# Patient Record
Sex: Female | Born: 1950 | Race: Black or African American | Hispanic: No | Smoking: Never smoker
Health system: Southern US, Community
[De-identification: ages and names within clinical notes are randomized; demographics above are authoritative.]

## PROBLEM LIST (undated history)

## (undated) DIAGNOSIS — F32A Depression, unspecified: Secondary | ICD-10-CM

## (undated) DIAGNOSIS — E119 Type 2 diabetes mellitus without complications: Secondary | ICD-10-CM

## (undated) DIAGNOSIS — M1990 Unspecified osteoarthritis, unspecified site: Secondary | ICD-10-CM

## (undated) DIAGNOSIS — F419 Anxiety disorder, unspecified: Secondary | ICD-10-CM

## (undated) DIAGNOSIS — N189 Chronic kidney disease, unspecified: Secondary | ICD-10-CM

## (undated) DIAGNOSIS — G4730 Sleep apnea, unspecified: Secondary | ICD-10-CM

## (undated) DIAGNOSIS — I10 Essential (primary) hypertension: Secondary | ICD-10-CM

## (undated) DIAGNOSIS — E785 Hyperlipidemia, unspecified: Secondary | ICD-10-CM

## (undated) DIAGNOSIS — T7840XA Allergy, unspecified, initial encounter: Secondary | ICD-10-CM

## (undated) DIAGNOSIS — J449 Chronic obstructive pulmonary disease, unspecified: Secondary | ICD-10-CM

## (undated) DIAGNOSIS — K219 Gastro-esophageal reflux disease without esophagitis: Secondary | ICD-10-CM

## (undated) DIAGNOSIS — J45909 Unspecified asthma, uncomplicated: Secondary | ICD-10-CM

## (undated) DIAGNOSIS — H269 Unspecified cataract: Secondary | ICD-10-CM

## (undated) HISTORY — PX: TUBAL LIGATION: SHX77

## (undated) HISTORY — PX: EYE SURGERY: SHX253

## (undated) HISTORY — DX: Hyperlipidemia, unspecified: E78.5

## (undated) HISTORY — PX: BREAST EXCISIONAL BIOPSY: SUR124

## (undated) HISTORY — DX: Allergy, unspecified, initial encounter: T78.40XA

## (undated) HISTORY — DX: Unspecified asthma, uncomplicated: J45.909

## (undated) HISTORY — PX: BREAST BIOPSY: SHX20

## (undated) HISTORY — DX: Sleep apnea, unspecified: G47.30

## (undated) HISTORY — DX: Gastro-esophageal reflux disease without esophagitis: K21.9

## (undated) HISTORY — DX: Chronic kidney disease, unspecified: N18.9

## (undated) HISTORY — DX: Unspecified osteoarthritis, unspecified site: M19.90

## (undated) HISTORY — DX: Unspecified cataract: H26.9

## (undated) HISTORY — DX: Depression, unspecified: F32.A

## (undated) HISTORY — DX: Chronic obstructive pulmonary disease, unspecified: J44.9

## (undated) HISTORY — DX: Anxiety disorder, unspecified: F41.9

---

## 2016-03-28 DIAGNOSIS — E113213 Type 2 diabetes mellitus with mild nonproliferative diabetic retinopathy with macular edema, bilateral: Secondary | ICD-10-CM | POA: Diagnosis not present

## 2016-03-28 DIAGNOSIS — H1045 Other chronic allergic conjunctivitis: Secondary | ICD-10-CM | POA: Diagnosis not present

## 2016-03-28 DIAGNOSIS — H25811 Combined forms of age-related cataract, right eye: Secondary | ICD-10-CM | POA: Diagnosis not present

## 2016-03-28 DIAGNOSIS — E113293 Type 2 diabetes mellitus with mild nonproliferative diabetic retinopathy without macular edema, bilateral: Secondary | ICD-10-CM | POA: Diagnosis not present

## 2016-04-21 DIAGNOSIS — E1165 Type 2 diabetes mellitus with hyperglycemia: Secondary | ICD-10-CM | POA: Diagnosis not present

## 2016-05-20 DIAGNOSIS — M21612 Bunion of left foot: Secondary | ICD-10-CM | POA: Diagnosis not present

## 2016-06-26 DIAGNOSIS — R300 Dysuria: Secondary | ICD-10-CM | POA: Diagnosis not present

## 2016-06-26 DIAGNOSIS — Z794 Long term (current) use of insulin: Secondary | ICD-10-CM | POA: Diagnosis not present

## 2016-06-26 DIAGNOSIS — E1165 Type 2 diabetes mellitus with hyperglycemia: Secondary | ICD-10-CM | POA: Diagnosis not present

## 2016-06-26 DIAGNOSIS — K635 Polyp of colon: Secondary | ICD-10-CM | POA: Diagnosis not present

## 2016-07-08 DIAGNOSIS — G4710 Hypersomnia, unspecified: Secondary | ICD-10-CM | POA: Diagnosis not present

## 2016-07-08 DIAGNOSIS — G4733 Obstructive sleep apnea (adult) (pediatric): Secondary | ICD-10-CM | POA: Diagnosis not present

## 2016-10-03 ENCOUNTER — Encounter (HOSPITAL_COMMUNITY): Payer: Self-pay | Admitting: *Deleted

## 2016-10-03 ENCOUNTER — Ambulatory Visit (HOSPITAL_COMMUNITY)
Admission: EM | Admit: 2016-10-03 | Discharge: 2016-10-03 | Disposition: A | Discharge status: Home / Self Care | Payer: Medicare Other | Attending: Internal Medicine | Admitting: Internal Medicine

## 2016-10-03 DIAGNOSIS — G4730 Sleep apnea, unspecified: Secondary | ICD-10-CM | POA: Diagnosis not present

## 2016-10-03 DIAGNOSIS — Z880 Allergy status to penicillin: Secondary | ICD-10-CM | POA: Diagnosis not present

## 2016-10-03 DIAGNOSIS — Z888 Allergy status to other drugs, medicaments and biological substances status: Secondary | ICD-10-CM | POA: Insufficient documentation

## 2016-10-03 DIAGNOSIS — Z79899 Other long term (current) drug therapy: Secondary | ICD-10-CM | POA: Insufficient documentation

## 2016-10-03 DIAGNOSIS — Z794 Long term (current) use of insulin: Secondary | ICD-10-CM | POA: Diagnosis not present

## 2016-10-03 DIAGNOSIS — R350 Frequency of micturition: Secondary | ICD-10-CM | POA: Diagnosis not present

## 2016-10-03 DIAGNOSIS — E119 Type 2 diabetes mellitus without complications: Secondary | ICD-10-CM | POA: Insufficient documentation

## 2016-10-03 DIAGNOSIS — I10 Essential (primary) hypertension: Secondary | ICD-10-CM | POA: Insufficient documentation

## 2016-10-03 DIAGNOSIS — R42 Dizziness and giddiness: Secondary | ICD-10-CM | POA: Diagnosis not present

## 2016-10-03 DIAGNOSIS — Z9989 Dependence on other enabling machines and devices: Secondary | ICD-10-CM | POA: Diagnosis not present

## 2016-10-03 DIAGNOSIS — R300 Dysuria: Secondary | ICD-10-CM | POA: Insufficient documentation

## 2016-10-03 DIAGNOSIS — R81 Glycosuria: Secondary | ICD-10-CM | POA: Diagnosis not present

## 2016-10-03 HISTORY — DX: Essential (primary) hypertension: I10

## 2016-10-03 HISTORY — DX: Type 2 diabetes mellitus without complications: E11.9

## 2016-10-03 LAB — POCT URINALYSIS DIP (DEVICE)
Bilirubin Urine: NEGATIVE
Glucose, UA: 500 mg/dL — AB
Ketones, ur: NEGATIVE mg/dL
Nitrite: NEGATIVE
Protein, ur: 100 mg/dL — AB
Specific Gravity, Urine: 1.01 (ref 1.005–1.030)
Urobilinogen, UA: 0.2 mg/dL (ref 0.0–1.0)
pH: 5.5 (ref 5.0–8.0)

## 2016-10-03 MED ORDER — FLUCONAZOLE 150 MG PO TABS: 150.0000 mg | ORAL_TABLET | Freq: Once | ORAL | 0 refills | Status: AC

## 2016-10-03 NOTE — ED Notes (Addendum)
Orthostatic   Vital  Signs        159/75    Laying        bp  185/76   Sitting      bp   177/ 87    Standing      Pulses      Pulse  85    Laying      Pulse   86     Sitting         Pulse    98    Standing

## 2016-10-03 NOTE — ED Provider Notes (Signed)
[facility]    CSN: [id number] Arrival date & time: 10/03/16  1005     History   Chief Complaint Chief Complaint  Patient presents with  . Urinary Frequency    HPI Hayley Case is a 67 y.o. female. She presents today with dysuria, urinary frequency, going on for about a month. She also has a sensation of dizziness and off balance, worst when she changes position, or puts her head forward. She uses a CPAP and occasionally has minimal runny nose, but no head congestion or severe drainage to suggest infection. She has not fallen down, and was able to walk into the urgent care independently. In the last couple months, increased life stress: death of a couple of her siblings and sold her home and moved to [place] with her son, who took a job at [employer] recently.  Not sleeping well.     HPI  Past Medical History:  Diagnosis Date  . Diabetes mellitus without complication ([place])   . Hypertension   Sleep apnea, uses a CPAP    Past Surgical History:  Procedure Laterality Date  . BREAST BIOPSY      OB History    No data available       Home Medications    Prior to Admission medications   Medication Sig Start Date End Date Taking? Authorizing Provider  amLODipine (NORVASC) 10 MG tablet Take 10 mg by mouth daily.   Yes [provider]  Dulaglutide (TRULICITY [place]) Inject into the skin.   Yes [provider]  insulin aspart (NOVOLOG) 100 UNIT/ML injection Inject into the skin 3 (three) times daily before meals.   Yes [provider]  insulin glargine (LANTUS) 100 UNIT/ML injection Inject into the skin at bedtime.   Yes [provider]  losartan-hydrochlorothiazide (HYZAAR) 100-25 MG tablet Take 1 tablet by mouth daily.   Yes [provider]  METOPROLOL SUCCINATE PO Take by mouth.   Yes [provider]  fluconazole (DIFLUCAN) 150 MG tablet Take 1 tablet (150 mg total) by mouth once. Repeat dose in 3d if needed. 10/03/16  10/03/16  Sherlene Shams, MD    Family History History reviewed. No pertinent family history.  Social History Social History  Substance Use Topics  . Smoking status: Never Smoker  . Smokeless tobacco: Never Used  . Alcohol use No     Allergies   Penicillins and Septra [sulfamethoxazole-trimethoprim]   Review of Systems Review of Systems  All other systems reviewed and are negative.    Physical Exam Triage Vital Signs ED Triage Vitals [10/03/16 1101]  Enc Vitals Group     BP (!) 168/88     Pulse Rate 78     Resp 18     Temp 98.6 F (37 C)     Temp Source Oral     SpO2 100 %     Weight      Height      Pain Score      Pain Loc    Updated Vital Signs BP (!) 168/88 (BP Location: Left Arm)   Pulse 78   Temp 98.6 F (37 C) (Oral)   Resp 18   SpO2 100%   Physical Exam  Constitutional: She is oriented to person, place, and time. No distress.  HENT:  Head: Atraumatic.  Bilateral TMs translucent, no erythema No significant nasal congestion  Eyes:  Conjugate gaze observed, no eye redness/discharge  Neck: Neck supple.  Cardiovascular: Normal rate and regular  rhythm.   Pulmonary/Chest: No respiratory distress. She has no wheezes. She has no rales.  Lungs clear, symmetric breath sounds  Abdominal: She exhibits no distension.  Musculoskeletal: Normal range of motion.  Neurological: She is alert and oriented to person, place, and time.  Able to climb on/off exam table quickly, without assistance. Face is symmetric, speech is clear/coherent  Skin: Skin is warm and dry.  Nursing note and vitals reviewed.    UC Treatments / Results  Labs (all labs ordered are listed, but only abnormal results are displayed) Labs Reviewed  POCT URINALYSIS DIP (DEVICE) - Abnormal; Notable for the following:       Result Value   Glucose, UA 500 (*)    Hgb urine dipstick SMALL (*)    Protein, ur 100 (*)    Leukocytes, UA SMALL (*)    All other components within normal limits    URINE CULTURE    Procedures Procedures (including critical care time) None today  Final Clinical Impressions(s) / UC Diagnoses   Final diagnoses:  Dysuria  Urinary frequency  Dizzy  Glycosuria   Urine test today showed a lot of sugar.  A culture is pending. The urgent care will contact you if any additional treatment is needed. Prescription for Diflucan, for yeast, was sent to the pharmacy. Please take your diabetes and blood pressure medicines regularly. Dizziness may be related to elevated blood sugar, and drinking extra fluids could help with this.  New Prescriptions Discharge Medication List as of 10/03/2016 11:50 AM    START taking these medications   Details  fluconazole (DIFLUCAN) 150 MG tablet Take 1 tablet (150 mg total) by mouth once. Repeat dose in 3d if needed., Starting Fri 10/03/2016, Normal         Controlled Substance Prescriptions [place] Controlled Substance Registry consulted? No   Sherlene Shams, MD 10/03/16 2111

## 2016-10-03 NOTE — Discharge Instructions (Addendum)
Urine test today showed a lot of sugar.  A culture is pending. The urgent care will contact you if any additional treatment is needed. Prescription for Diflucan, for yeast, was sent to the pharmacy. Please take your diabetes and blood pressure medicines regularly. Dizziness may be related to elevated blood sugar, and drinking extra fluids could help with this.

## 2016-10-03 NOTE — ED Triage Notes (Signed)
Pt  States    Symptoms  Of  Urinary  Frequency       And  Discomfort   At  Times        Pt  Is  A  Diabetic  Takes    And history  Of  htn    She  Is  Visiting  From  [place]    She  States   She  Gets  Dizzy  As   Well  When  She  Holds  Her  Head  Down

## 2016-10-05 ENCOUNTER — Telehealth (HOSPITAL_COMMUNITY): Payer: Self-pay | Admitting: Internal Medicine

## 2016-10-05 LAB — URINE CULTURE: Culture: >=100000 — AB

## 2016-10-05 MED ORDER — NITROFURANTOIN MONOHYD MACRO 100 MG PO CAPS: 100.0000 mg | ORAL_CAPSULE | Freq: Two times a day (BID) | ORAL | 0 refills | Status: DC

## 2016-10-05 NOTE — Telephone Encounter (Signed)
Please let patient know that urine culture was positive for E coli germ, sensitive to nitrofurantoin.  Rx nitrofurantoin was sent to the pharmacy of record, [facility] on [address] in [place].  Recheck for further evaluation if symptoms are not improving.  LM

## 2016-10-15 ENCOUNTER — Ambulatory Visit: Payer: Medicare Other | Attending: Internal Medicine | Admitting: Physician Assistant

## 2016-10-15 ENCOUNTER — Encounter: Payer: Self-pay | Admitting: Physician Assistant

## 2016-10-15 VITALS — BP 146/84 | HR 85 | Temp 98.5°F | Resp 16 | Wt 208.2 lb

## 2016-10-15 DIAGNOSIS — R739 Hyperglycemia, unspecified: Secondary | ICD-10-CM

## 2016-10-15 DIAGNOSIS — Z79899 Other long term (current) drug therapy: Secondary | ICD-10-CM | POA: Insufficient documentation

## 2016-10-15 DIAGNOSIS — E118 Type 2 diabetes mellitus with unspecified complications: Secondary | ICD-10-CM | POA: Diagnosis not present

## 2016-10-15 DIAGNOSIS — Z880 Allergy status to penicillin: Secondary | ICD-10-CM | POA: Insufficient documentation

## 2016-10-15 DIAGNOSIS — R42 Dizziness and giddiness: Secondary | ICD-10-CM | POA: Diagnosis not present

## 2016-10-15 DIAGNOSIS — N3001 Acute cystitis with hematuria: Secondary | ICD-10-CM | POA: Diagnosis not present

## 2016-10-15 DIAGNOSIS — Z888 Allergy status to other drugs, medicaments and biological substances status: Secondary | ICD-10-CM | POA: Diagnosis not present

## 2016-10-15 DIAGNOSIS — Z794 Long term (current) use of insulin: Secondary | ICD-10-CM | POA: Diagnosis not present

## 2016-10-15 DIAGNOSIS — I10 Essential (primary) hypertension: Secondary | ICD-10-CM | POA: Diagnosis not present

## 2016-10-15 LAB — GLUCOSE, POCT (MANUAL RESULT ENTRY)
POC Glucose: 354 mg/dl — AB (ref 70–99)
POC Glucose: 311 mg/dl — AB (ref 70–99)

## 2016-10-15 LAB — POCT URINALYSIS DIPSTICK
Bilirubin, UA: NEGATIVE
Glucose, UA: 500
Nitrite, UA: NEGATIVE
Protein, UA: 100
Spec Grav, UA: 1.015 (ref 1.010–1.025)
Urobilinogen, UA: 0.2 E.U./dL
pH, UA: 5 (ref 5.0–8.0)

## 2016-10-15 LAB — POCT GLYCOSYLATED HEMOGLOBIN (HGB A1C): Hemoglobin A1C: 11

## 2016-10-15 MED ORDER — INSULIN ASPART 100 UNIT/ML ~~LOC~~ SOLN
25.0000 [IU] | Freq: Once | SUBCUTANEOUS | Status: AC
Start: 2016-10-15 — End: 2016-10-15
  Administered 2016-10-15: 25 [IU] via SUBCUTANEOUS

## 2016-10-15 MED ORDER — FLUCONAZOLE 150 MG PO TABS: 150.0000 mg | ORAL_TABLET | Freq: Once | ORAL | 0 refills | Status: AC

## 2016-10-15 MED ORDER — INSULIN GLARGINE 100 UNIT/ML ~~LOC~~ SOLN: 30.0000 [IU] | Freq: Two times a day (BID) | SUBCUTANEOUS | 1 refills | Status: DC

## 2016-10-15 MED ORDER — GLUCOSE BLOOD VI STRP: ORAL_STRIP | 12 refills | Status: DC

## 2016-10-15 MED ORDER — NITROFURANTOIN MONOHYD MACRO 100 MG PO CAPS: 100.0000 mg | ORAL_CAPSULE | Freq: Two times a day (BID) | ORAL | 0 refills | Status: DC

## 2016-10-15 MED ORDER — ONETOUCH DELICA LANCETS 33G MISC: 1 refills | Status: DC

## 2016-10-15 MED ORDER — LOSARTAN POTASSIUM-HCTZ 100-25 MG PO TABS: 1.0000 | ORAL_TABLET | Freq: Every day | ORAL | 1 refills | Status: DC

## 2016-10-15 MED ORDER — AMLODIPINE BESYLATE 10 MG PO TABS: 10.0000 mg | ORAL_TABLET | Freq: Every day | ORAL | 1 refills | Status: DC

## 2016-10-15 NOTE — Patient Instructions (Signed)
Check blood sugars fasting and at bedtime daily.  Some lunch time readings after lunch would also be helpful.  Drink 100 ounces water daily  Bring your medications with you to your next visit.

## 2016-10-15 NOTE — Progress Notes (Signed)
Patient ID: Hayley Case, female   DOB: 1950-05-21, 66 y.o.   MRN: [id number]      Hayley Case, is a 66 y.o. female  QQV:[id number]  [id number]:[id number]  DOB - 1950/08/29  Subjective:  Chief Complaint and HPI: Hayley Case is a 66 y.o. female here today to establish care and for a follow up visit  After being seen at the Urgent care 9/21 for dysuria and dizziness. She was treated with Macrobid and diflucan. She is still having some dysuria although it has improved some.  She moved here in July from [place].  Needs to establish with PCP.  Needs new glucometer.  Not checking blood sugars regularly.  Doesn't have medications with her and doesn't know some of her medication doses.  Dizziness is improving.  No h/o kidney problems/fluid restrictions.    She has been out of Novolog for a couple of months.  She has been taking Lantus 50units at bedtime and trulicity once/week but she doesn't know which dose of that she takes.    ED/Hospital notes reviewed.   Social History: moved here from [place] and is currently staying with her son  ROS:   Constitutional:  No f/c, No night sweats, No unexplained weight loss. EENT:  No vision changes, No blurry vision, No hearing changes. No mouth, throat, or ear problems.  Respiratory: No cough, No SOB Cardiac: No CP, no palpitations GI:  No abd pain, No N/V/D. GU: Mild Urinary s/sx Musculoskeletal: No joint pain Neuro: No headache, no dizziness, no motor weakness.  Skin: No rash Endocrine:  No polydipsia. No polyuria.  Psych: Denies SI/HI  No problems updated.  ALLERGIES: Allergies  Allergen Reactions  . Penicillins   . Septra [Sulfamethoxazole-Trimethoprim]     PAST MEDICAL HISTORY: Past Medical History:  Diagnosis Date  . Diabetes mellitus without complication ([place])   . Hypertension     MEDICATIONS AT HOME: Prior to Admission medications   Medication Sig Start Date End Date Taking? Authorizing Provider  amLODipine (NORVASC) 10 MG  tablet Take 1 tablet (10 mg total) by mouth daily. 10/15/16   Argentina Donovan, PA-C  Dulaglutide (TRULICITY [place]) Inject into the skin.    [provider]  fluconazole (DIFLUCAN) 150 MG tablet Take 1 tablet (150 mg total) by mouth once. 10/15/16 10/15/16  Argentina Donovan, PA-C  insulin aspart (NOVOLOG) 100 UNIT/ML injection Inject into the skin 3 (three) times daily before meals.    [provider]  insulin glargine (LANTUS) 100 UNIT/ML injection Inject 0.3 mLs (30 Units total) into the skin 2 (two) times daily. 10/15/16   Argentina Donovan, PA-C  losartan-hydrochlorothiazide (HYZAAR) 100-25 MG tablet Take 1 tablet by mouth daily. 10/15/16   Argentina Donovan, PA-C  METOPROLOL SUCCINATE PO Take by mouth.    [provider]  nitrofurantoin, macrocrystal-monohydrate, (MACROBID) 100 MG capsule Take 1 capsule (100 mg total) by mouth 2 (two) times daily. 10/15/16   Argentina Donovan, PA-C     Objective:  EXAM:   Vitals:   10/15/16 1349  BP: (!) 146/84  Pulse: 85  Resp: 16  Temp: 98.5 F (36.9 C)  TempSrc: Oral  SpO2: 99%  Weight: 208 lb 3.2 oz (94.4 kg)    General appearance : A&OX3. NAD. Non-toxic-appearing HEENT: Atraumatic and Normocephalic.  PERRLA. EOM intact.  Neck: supple, no JVD. No cervical lymphadenopathy. No thyromegaly Chest/Lungs:  Breathing-non-labored, Good air entry bilaterally, breath sounds normal without rales, rhonchi, or wheezing  CVS: S1 S2 regular, no  murmurs, gallops, rubs  Extremities: Bilateral Lower Ext shows no edema, both legs are warm to touch with = pulse throughout Neurology:  CN II-XII grossly intact, Non focal.   Psych:  TP linear. J/I WNL. Normal speech. Appropriate eye contact and affect.  Skin:  No Rash  Data Review Lab Results  Component Value Date   HGBA1C 11.0 10/15/2016     Assessment & Plan   1. Type 2 diabetes mellitus with complication, unspecified whether long term insulin use (HCC) Uncontrolled- - Glucose  (CBG) - HgB A1c - Urinalysis Dipstick - insulin aspart (novoLOG) injection 25 Units; Inject 0.25 mLs (25 Units total) into the skin once. - Comprehensive metabolic panel -increase and divide dose of Lantus for better coverage-30units twice daily.  Continue Trulicity.   Will add back SS as needed.  Check blood sugars fasting and at bedtime daily.  Some lunch time readings after lunch would also be helpful. Drink 100 ounces water daily Bring your medications with you to your next visit.    2. Acute cystitis with hematuria - Urine Culture - nitrofurantoin, macrocrystal-monohydrate, (MACROBID) 100 MG capsule; Take 1 capsule (100 mg total) by mouth 2 (two) times daily.  Dispense: 10 capsule; Refill: 0 - fluconazole (DIFLUCAN) 150 MG tablet; Take 1 tablet (150 mg total) by mouth once.  Dispense: 1 tablet; Refill: 0 Drink 100 ounces water daily  4. Hypertension, unspecified type Sub-optimal control-RF meds-will continue to follow/monitor.    Patient have been counseled extensively about nutrition and exercise.  Also gave resources for local exercise programs for people in her age-range.    Return in about 2 weeks (around 10/29/2016) for Nicoletta Ba for diabetes med management then in 6 weeks to be assigned PCP.  The patient was given clear instructions to go to ER or return to medical center if symptoms don't improve, worsen or new problems develop. The patient verbalized understanding. The patient was told to call to get lab results if they haven't heard anything in the next week.     Freeman Caldron, PA-C [facility], [place]   10/15/2016, 2:39 PM

## 2016-10-16 LAB — COMPREHENSIVE METABOLIC PANEL
ALT: 18 IU/L (ref 0–32)
AST: 15 IU/L (ref 0–40)
Albumin/Globulin Ratio: 1.6 (ref 1.2–2.2)
Albumin: 4.2 g/dL (ref 3.6–4.8)
Alkaline Phosphatase: 209 IU/L — ABNORMAL HIGH (ref 39–117)
BUN/Creatinine Ratio: 19 (ref 12–28)
BUN: 44 mg/dL — ABNORMAL HIGH (ref 8–27)
Bilirubin Total: 0.3 mg/dL (ref 0.0–1.2)
CO2: 19 mmol/L — ABNORMAL LOW (ref 20–29)
Calcium: 9.3 mg/dL (ref 8.7–10.3)
Chloride: 102 mmol/L (ref 96–106)
Creatinine, Ser: 2.33 mg/dL — ABNORMAL HIGH (ref 0.57–1.00)
GFR calc Af Amer: 25 mL/min/{1.73_m2} — ABNORMAL LOW (ref >59)
GFR calc non Af Amer: 21 mL/min/{1.73_m2} — ABNORMAL LOW (ref >59)
Globulin, Total: 2.7 g/dL (ref 1.5–4.5)
Glucose: 280 mg/dL — ABNORMAL HIGH (ref 65–99)
Potassium: 4.8 mmol/L (ref 3.5–5.2)
Sodium: 137 mmol/L (ref 134–144)
Total Protein: 6.9 g/dL (ref 6.0–8.5)

## 2016-10-19 LAB — URINE CULTURE

## 2016-10-20 ENCOUNTER — Telehealth: Payer: Self-pay

## 2016-10-20 NOTE — Telephone Encounter (Signed)
Contacted pt to go over lab results pt is aware of results and doesn't have any questions or concerns 

## 2016-10-30 ENCOUNTER — Ambulatory Visit: Payer: Medicare Other | Attending: Internal Medicine | Admitting: Pharmacist

## 2016-10-30 VITALS — BP 135/78 | HR 80

## 2016-10-30 DIAGNOSIS — E119 Type 2 diabetes mellitus without complications: Secondary | ICD-10-CM | POA: Diagnosis not present

## 2016-10-30 DIAGNOSIS — Z5181 Encounter for therapeutic drug level monitoring: Secondary | ICD-10-CM | POA: Diagnosis not present

## 2016-10-30 DIAGNOSIS — E118 Type 2 diabetes mellitus with unspecified complications: Secondary | ICD-10-CM | POA: Diagnosis not present

## 2016-10-30 DIAGNOSIS — Z794 Long term (current) use of insulin: Secondary | ICD-10-CM | POA: Diagnosis not present

## 2016-10-30 MED ORDER — INSULIN ASPART 100 UNIT/ML FLEXPEN: PEN_INJECTOR | SUBCUTANEOUS | 0 refills | Status: DC

## 2016-10-30 MED ORDER — ATORVASTATIN CALCIUM 40 MG PO TABS: 40.0000 mg | ORAL_TABLET | Freq: Every day | ORAL | 2 refills | Status: DC

## 2016-10-30 MED ORDER — LOSARTAN POTASSIUM-HCTZ 100-25 MG PO TABS: 1.0000 | ORAL_TABLET | Freq: Every day | ORAL | 1 refills | Status: DC

## 2016-10-30 MED ORDER — INSULIN GLARGINE 100 UNIT/ML SOLOSTAR PEN: 30.0000 [IU] | PEN_INJECTOR | Freq: Two times a day (BID) | SUBCUTANEOUS | 2 refills | Status: DC

## 2016-10-30 MED ORDER — ALLOPURINOL 100 MG PO TABS: 100.0000 mg | ORAL_TABLET | Freq: Every day | ORAL | 2 refills | Status: DC

## 2016-10-30 MED ORDER — DULAGLUTIDE 1.5 MG/0.5ML ~~LOC~~ SOAJ: 1.5000 mg | SUBCUTANEOUS | 2 refills | Status: DC

## 2016-10-30 MED ORDER — INSULIN PEN NEEDLE 31G X 5 MM MISC: 5 refills | Status: DC

## 2016-10-30 NOTE — Progress Notes (Signed)
    S:     Chief Complaint  Patient presents with  . Medication Management    Patient arrives in good spirits.  Presents for diabetes evaluation, education, and management at the request of Freeman Caldron, [place]. Patient was referred on 10/15/16.  Patient reports adherence with medications.  Current diabetes medications include: Lantus 30 units BID, Trulicity 1.5 mg weekly (typically on Fridays)  She reports that her blood sugars have been difficult to control previously and she has not been controlled recently.   Current hypertension medications include: losartan-HCTZ 100-25 mg daily, amlodipine 10 mg daily, metoprolol  Patient denies hypoglycemic events.  Patient reported dietary habits: Eats 3 meals/day. Has been drinking more sweet tea since moving to the [place]. She ate a lot of things she shouldn't have last week when she didn't have power.   Patient denies nocturia.  Patient denies neuropathy. Patient denies visual changes. Patient reports self foot exams.   Patient brought with her all of her medications and they were reviewed with her and added to her medication list.   O:  Physical Exam   ROS   Lab Results  Component Value Date   HGBA1C 11.0 10/15/2016   There were no vitals filed for this visit.  Home fasting CBG: 80s-100s 2 hour post-prandial/random CBG: 100s-384 (typically higher with lunch and dinner)   A/P: Diabetes longstanding currently uncontrolled based on A1c of 11 but home readings are improving. Patient denies hypoglycemic events and is able to verbalize appropriate hypoglycemia management plan. Patient reports adherence with medication. Control is suboptimal due to dietary indiscretion and sedentary lifestyle.  Continue all medications as prescribed. Restart Novolog 5 units before lunch and dinner for now. Will likely need breakfast dose too but she said historically she has taken less Novolog in the morning so will add that back after the lunch and  dinner doses are titrated up. Encouraged patient to drink Crystal Light or unsweet tea with Splenda.   Next A1C anticipated January 2019.    Hypertension longstanding currently controlled on current medications.  Patient reports adherence with medication. Continue current medications as prescribed.  Written patient instructions provided.  Total time in face to face counseling 30 minutes.   Follow up in Pharmacist Clinic Visit in 2 weeks.   Patient seen with Thornton Park, PharmD Candidate

## 2016-10-30 NOTE — Patient Instructions (Addendum)
Thanks for coming to see [place]!  Start the Novolog back - 5 units before lunch and dinner. Do not take it at breakfast for right now.  Continue to get blood sugar readings  Try Crystal Light or unsweetened tea with Splenda or your choice sweetner  Come back to see me in 2 weeks

## 2016-11-11 DIAGNOSIS — Z23 Encounter for immunization: Secondary | ICD-10-CM | POA: Diagnosis not present

## 2016-11-18 ENCOUNTER — Ambulatory Visit: Payer: Medicare Other | Attending: Internal Medicine | Admitting: Pharmacist

## 2016-11-18 ENCOUNTER — Encounter: Payer: Self-pay | Admitting: Pharmacist

## 2016-11-18 DIAGNOSIS — Z794 Long term (current) use of insulin: Secondary | ICD-10-CM | POA: Insufficient documentation

## 2016-11-18 DIAGNOSIS — E118 Type 2 diabetes mellitus with unspecified complications: Secondary | ICD-10-CM

## 2016-11-18 DIAGNOSIS — E119 Type 2 diabetes mellitus without complications: Secondary | ICD-10-CM | POA: Insufficient documentation

## 2016-11-18 NOTE — Patient Instructions (Addendum)
Thanks for coming to see [place]!  Take the Novolog now that you are back home and see if that helps with your after dinner blood sugar readings. Take right before you eat.  Follow up with [place] next week.

## 2016-11-18 NOTE — Progress Notes (Signed)
    S:     Chief Complaint  Patient presents with  . Medication Management    Patient arrives in good spirits.  Presents for diabetes evaluation, education, and management at the request of Dr. Doreene Burke. Patient was referred on 10/15/16.  Patient reports adherence with medications except for her Novolog - she is traveling, and she forgot to take it.  Current diabetes medications include: Lantus 30 units BID, Trulicity 1.5 mg weekly (typically on Fridays), Novolog 5 units BID before lunch and dinner.  Patient denies hypoglycemic events.  Patient reported dietary habits: has been eating better since last visit.    Patient denies nocturia.  Patient denies neuropathy. Patient denies visual changes. Patient reports self foot exams.   O:  Physical Exam   ROS   Lab Results  Component Value Date   HGBA1C 11.0 10/15/2016   There were no vitals filed for this visit.  Home fasting CBG: 84-115 2 hour post-prandial/random CBG: 100s-384 (typically higher with lunch and dinner, mostly in the 200s)  Avg  CBG 165.4   A/P: Diabetes longstanding currently uncontrolled based on A1c of 11 but home readings are improving. Patient denies hypoglycemic events and is able to verbalize appropriate hypoglycemia management plan. Patient reports adherence with medication. Control is suboptimal due to dietary indiscretion and sedentary lifestyle.  Continue all medications as prescribed. Instructed patient to start taking the Novolog now that she is home and see if that helps her afternoon and evening post-prandials. Congratulated patient on her progress so far, her fastings are controlled and we will hopefully get her post-prandials to goal soon.   Next A1C anticipated January 2019.    Written patient instructions provided.  Total time in face to face counseling 30 minutes.   Follow up with PCP in 1 weeks as scheduled.  Patient seen with Thornton Park, PharmD Candidate

## 2016-11-26 ENCOUNTER — Ambulatory Visit: Payer: BLUE CROSS/BLUE SHIELD | Admitting: Family Medicine

## 2016-12-08 ENCOUNTER — Encounter: Payer: Self-pay | Admitting: Nurse Practitioner

## 2016-12-08 ENCOUNTER — Telehealth: Payer: Self-pay

## 2016-12-08 ENCOUNTER — Ambulatory Visit: Payer: Medicare Other | Attending: Family Medicine | Admitting: Nurse Practitioner

## 2016-12-08 VITALS — BP 149/83 | HR 92 | Temp 97.8°F | Resp 12 | Ht 64.96 in | Wt 211.8 lb

## 2016-12-08 DIAGNOSIS — E785 Hyperlipidemia, unspecified: Secondary | ICD-10-CM | POA: Diagnosis not present

## 2016-12-08 DIAGNOSIS — Z794 Long term (current) use of insulin: Secondary | ICD-10-CM | POA: Insufficient documentation

## 2016-12-08 DIAGNOSIS — Z881 Allergy status to other antibiotic agents status: Secondary | ICD-10-CM | POA: Insufficient documentation

## 2016-12-08 DIAGNOSIS — Z9989 Dependence on other enabling machines and devices: Secondary | ICD-10-CM

## 2016-12-08 DIAGNOSIS — Z800 Family history of malignant neoplasm of digestive organs: Secondary | ICD-10-CM | POA: Diagnosis not present

## 2016-12-08 DIAGNOSIS — G4733 Obstructive sleep apnea (adult) (pediatric): Secondary | ICD-10-CM

## 2016-12-08 DIAGNOSIS — Z9889 Other specified postprocedural states: Secondary | ICD-10-CM | POA: Diagnosis not present

## 2016-12-08 DIAGNOSIS — Z9114 Patient's other noncompliance with medication regimen: Secondary | ICD-10-CM | POA: Diagnosis not present

## 2016-12-08 DIAGNOSIS — R399 Unspecified symptoms and signs involving the genitourinary system: Secondary | ICD-10-CM

## 2016-12-08 DIAGNOSIS — I10 Essential (primary) hypertension: Secondary | ICD-10-CM | POA: Diagnosis not present

## 2016-12-08 DIAGNOSIS — Z79899 Other long term (current) drug therapy: Secondary | ICD-10-CM | POA: Diagnosis not present

## 2016-12-08 DIAGNOSIS — Z880 Allergy status to penicillin: Secondary | ICD-10-CM | POA: Insufficient documentation

## 2016-12-08 DIAGNOSIS — E1169 Type 2 diabetes mellitus with other specified complication: Secondary | ICD-10-CM

## 2016-12-08 DIAGNOSIS — E119 Type 2 diabetes mellitus without complications: Secondary | ICD-10-CM | POA: Insufficient documentation

## 2016-12-08 LAB — POCT URINALYSIS DIPSTICK
Bilirubin, UA: NEGATIVE
Glucose, UA: NEGATIVE
Ketones, UA: NEGATIVE
Nitrite, UA: POSITIVE
Protein, UA: 300
Spec Grav, UA: 1.02 (ref 1.010–1.025)
Urobilinogen, UA: 0.2 E.U./dL
pH, UA: 5.5 (ref 5.0–8.0)

## 2016-12-08 LAB — GLUCOSE, POCT (MANUAL RESULT ENTRY): POC Glucose: 155 mg/dl — AB (ref 70–99)

## 2016-12-08 MED ORDER — CIPROFLOXACIN HCL 500 MG PO TABS: 500.0000 mg | ORAL_TABLET | Freq: Two times a day (BID) | ORAL | 0 refills | Status: AC

## 2016-12-08 NOTE — Telephone Encounter (Signed)
-----   Message from Gildardo Pounds, NP sent at 12/08/2016  1:43 PM EST ----- Please call patient and let her know she is still showing a UTI. Will have abx ordered and sent to pharmacy. Once cultures return will let her know if she is on the appropriate antibiotic.

## 2016-12-08 NOTE — Telephone Encounter (Signed)
Patient informed on urine result and ABX sent to her pharmacy for pick up.   Patient verified DOB.

## 2016-12-08 NOTE — Patient Instructions (Addendum)
May try Eucerin or Aquaphor cream for dry spots.

## 2016-12-08 NOTE — Progress Notes (Addendum)
Assessment & Plan:  Hayley Case was seen today for follow-up.  Diagnoses and all orders for this visit:  Essential hypertension Continue all antihypertensives as prescribed.  Remember to bring in your blood pressure log with you for your follow up appointment.  DASH/Mediterranean Diets are healthier choices for HTN.    Type 2 diabetes mellitus without complication, without long-term current use of insulin (HCC) -     Glucose (CBG) Diabetes is poorly controlled. Advised patient to keep a fasting blood sugar log fast, 2 hours post lunch and bedtime which will be reviewed at the next office visit.  Hyperlipidemia associated with type 2 diabetes mellitus ([place]) Continue medications as prescribed. Exercise at least 150 minutes per week Continue to work on low fat, heart healthy diet and participate in regular aerobic exercise program to control as well.  Lower urinary tract symptoms -     Urinalysis, dipstick only -     Urinalysis Dipstick -     Urine Culture  OSA on CPAP Patient reportedly stopped using 3 months ago due to cleaning her supplies with clorox and now the odor has prevented her from sleeping with mast at night. I did counsel her on the complications that can occur from uncontrolled OSA including heart disease, pulmonary disease and even death. I instructed Emory to place her supplies in a safe area where there is clean fresh air and allow them current odors to be eliminated.   Patient has been counseled on age-appropriate routine health concerns for screening and prevention. These are reviewed and up-to-date. Referrals have been placed accordingly. Immunizations are up-to-date or declined.    Subjective:   Chief Complaint  Patient presents with  . Follow-up    Patient is here for a follow up and need medication refills; metoprolol.    HPI Hayley Case 66 y.o. female presents to office today to establish care as new patient. She moved here from [place] to [facility] in August.  Reports feelings of loneliness as most of her friends are still in [place] as well as experiencing grief with the recent loss of her brother and other siblings over the past 1-2 years.  HEALTH MAINTENANCE She would like to hold of on referrals at this time as her insurance will be switching over to a new carrier in a few months.  Colonoscopy: Had 14 polyps removed last year. Recommended to have repeat colonoscopy this year. She has a family history of colon cancer (sister). Ophthalmology: Will schedule at her next office appointment.  Mammogram: Will schedule at her next office appointment.  DEXA SCAN: Will schedule at her next office appointment.     DM II She has not been compliant with medication administration. States she forgets to inject her novolog. Endorses compliance with Trulicity and Lantus. We discussed complications that can occur from poorly controlled DM. She is aware of the risks with poor medication compliance. She does not check her blood sugars and does not have a log with her today.  Lab Results  Component Value Date   HGBA1C 11.0 10/15/2016   Essential Hypertension She has been taking 1/2 tablet of her losartan-hctz. Reports dizziness with initial onset of taking it a few months ago. I have encouraged her to make sure she is drinking plenty of water daily and to restart the losartan-hctz at the full dosage. If she begins to experience dizziness again she is aware to stop taking it, notify the office and we will look at increasing her metoprolol. She does  not check her blood pressure at home nor does she have a blood pressure cuff. She currently denies shortness of breath, chest pain, lightheadedness, dizziness, palpitations or BLE edema. She does endorse medication compliance with her norvasc and metoprolol.   Hyperlipidemia She takes atorvastatin and endorses medication compliance. She denies statin intolerance or myalgias. She is not diet compliant. Risk factors for heart  disease and stroke include: Weight, age, diabetes and hypertension.   Urinary Tract Infection Patient complains of burning with urination, left flank pain, frequency, incomplete bladder emptying and pain in the lower abdomen She has had symptoms for several weeks.  Patient denies back pain, fever and vaginal discharge. Patient does have a history of recurrent UTI.  Patient does not have a history of pyelonephritis. She was given Diflucan in September for increased glucose in urine and possible yeast infection; culture showed Ecoli so she was then order macrobid which she reports taking. She was given a second dose of macrobid on 10-15-2016 for recurrent infection. Today she continues to report the same symptoms. Will order urine culture and treat accordingly.    Review of Systems  Constitutional: Negative for fever, malaise/fatigue and weight loss.  HENT: Negative.  Negative for nosebleeds.   Eyes: Negative.  Negative for blurred vision, double vision and photophobia.  Respiratory: Negative.  Negative for cough and shortness of breath.   Cardiovascular: Negative.  Negative for chest pain, palpitations and leg swelling.  Gastrointestinal: Negative.  Negative for abdominal pain, constipation, diarrhea, heartburn, nausea and vomiting.  Genitourinary: Positive for dysuria, flank pain, frequency and urgency. Negative for hematuria.  Musculoskeletal: Negative for myalgias.  Neurological: Negative.  Negative for dizziness, focal weakness, seizures and headaches.  Endo/Heme/Allergies: Negative for environmental allergies.  Psychiatric/Behavioral: Negative.  Negative for suicidal ideas.    Past Medical History:  Diagnosis Date  . Diabetes mellitus without complication ([place])   . Hypertension     Past Surgical History:  Procedure Laterality Date  . BREAST BIOPSY      History reviewed. No pertinent family history.  Social History Reviewed with no changes to be made today.   Outpatient  Medications Prior to Visit  Medication Sig Dispense Refill  . allopurinol (ZYLOPRIM) 100 MG tablet Take 1 tablet (100 mg total) by mouth daily. 30 tablet 2  . amLODipine (NORVASC) 10 MG tablet Take 1 tablet (10 mg total) by mouth daily. 90 tablet 1  . atorvastatin (LIPITOR) 40 MG tablet Take 1 tablet (40 mg total) by mouth daily. 30 tablet 2  . Dulaglutide (TRULICITY) 1.5 JX/9.1YN SOPN Inject 1.5 mg into the skin once a week. 4 pen 2  . glucose blood test strip Use as instructed 100 each 12  . glucose blood test strip Use as instructed 100 each 12  . insulin aspart (NOVOLOG FLEXPEN) 100 UNIT/ML FlexPen Inject 5 units before lunch and dinner. 15 mL 0  . Insulin Glargine (LANTUS SOLOSTAR) 100 UNIT/ML Solostar Pen Inject 30 Units into the skin 2 (two) times daily. 5 pen 2  . Insulin Pen Needle (B-D UF III MINI PEN NEEDLES) 31G X 5 MM MISC Use as directed 100 each 5  . losartan-hydrochlorothiazide (HYZAAR) 100-25 MG tablet Take 1 tablet by mouth daily. 90 tablet 1  . metoprolol succinate (TOPROL-XL) 100 MG 24 hr tablet Take 100 mg by mouth daily. Take with or immediately following a meal.    . ONETOUCH DELICA LANCETS 82N MISC Check blood sugar fasting and at bedtime and some days after lunch 100 each  1  . nitrofurantoin, macrocrystal-monohydrate, (MACROBID) 100 MG capsule Take 1 capsule (100 mg total) by mouth 2 (two) times daily. (Patient not taking: Reported on 12/08/2016) 10 capsule 0   No facility-administered medications prior to visit.     Allergies  Allergen Reactions  . Penicillins   . Septra [Sulfamethoxazole-Trimethoprim]        Objective:    BP (!) 149/83 (Cuff Size: Normal)   Pulse 92   Temp 97.8 F (36.6 C) (Oral)   Resp 12   Ht 5' 4.96" (1.65 m)   Wt 211 lb 12.8 oz (96.1 kg)   SpO2 97%   BMI 35.29 kg/m  Wt Readings from Last 3 Encounters:  12/08/16 211 lb 12.8 oz (96.1 kg)  10/15/16 208 lb 3.2 oz (94.4 kg)    Physical Exam  Constitutional: She is oriented to  person, place, and time. She appears well-developed and well-nourished. She is cooperative.  HENT:  Head: Normocephalic and atraumatic.  Eyes: EOM are normal.  Neck: Normal range of motion.  Cardiovascular: Normal rate, regular rhythm, normal heart sounds and intact distal pulses. Exam reveals no gallop and no friction rub.  No murmur heard. Pulmonary/Chest: Effort normal and breath sounds normal. No tachypnea. No respiratory distress. She has no decreased breath sounds. She has no wheezes. She has no rhonchi. She has no rales. She exhibits no tenderness.  Abdominal: Soft. Bowel sounds are normal. There is no hepatosplenomegaly, splenomegaly or hepatomegaly. There is tenderness in the left upper quadrant. There is no CVA tenderness. No hernia. Hernia confirmed negative in the right inguinal area and confirmed negative in the left inguinal area.  Musculoskeletal: Normal range of motion. She exhibits no edema.  Neurological: She is alert and oriented to person, place, and time. Coordination normal.  Skin: Skin is warm and dry.  Psychiatric: She has a normal mood and affect. Her behavior is normal. Judgment and thought content normal.  Nursing note and vitals reviewed.        Patient has been counseled extensively about nutrition and exercise as well as the importance of adherence with medications and regular follow-up. The patient was given clear instructions to go to ER or return to medical center if symptoms don't improve, worsen or new problems develop. The patient verbalized understanding.   Follow-up: Return in about 2 months (around 02/07/2017).   Gildardo Pounds, FNP-BC [facility] and [place], [place]   12/08/2016, 3:29 PM

## 2016-12-11 LAB — URINE CULTURE

## 2016-12-12 ENCOUNTER — Telehealth: Payer: Self-pay

## 2016-12-12 NOTE — Telephone Encounter (Signed)
-----   Message from Hayley Pounds, NP sent at 12/12/2016 10:02 AM EST ----- Please let patient know the antibiotic that she is currently on should treat her UTI based on the culture results. Please remember to wipe from front to back as discussed.

## 2016-12-12 NOTE — Telephone Encounter (Signed)
Patient informed on antibiotic for UTI.  Patient verified DOB.

## 2016-12-22 ENCOUNTER — Ambulatory Visit: Payer: BLUE CROSS/BLUE SHIELD | Admitting: Nurse Practitioner

## 2017-01-02 ENCOUNTER — Ambulatory Visit: Payer: Medicare Other | Attending: Nurse Practitioner | Admitting: Nurse Practitioner

## 2017-01-02 ENCOUNTER — Other Ambulatory Visit: Payer: Self-pay | Admitting: Nurse Practitioner

## 2017-01-02 ENCOUNTER — Telehealth: Payer: Self-pay | Admitting: Nurse Practitioner

## 2017-01-02 ENCOUNTER — Encounter: Payer: Self-pay | Admitting: Nurse Practitioner

## 2017-01-02 VITALS — BP 138/73 | HR 87 | Temp 98.3°F | Ht 64.0 in | Wt 212.8 lb

## 2017-01-02 DIAGNOSIS — E119 Type 2 diabetes mellitus without complications: Secondary | ICD-10-CM | POA: Diagnosis not present

## 2017-01-02 DIAGNOSIS — Z888 Allergy status to other drugs, medicaments and biological substances status: Secondary | ICD-10-CM | POA: Insufficient documentation

## 2017-01-02 DIAGNOSIS — M542 Cervicalgia: Secondary | ICD-10-CM

## 2017-01-02 DIAGNOSIS — Z794 Long term (current) use of insulin: Secondary | ICD-10-CM | POA: Diagnosis not present

## 2017-01-02 DIAGNOSIS — I10 Essential (primary) hypertension: Secondary | ICD-10-CM | POA: Insufficient documentation

## 2017-01-02 DIAGNOSIS — Z79899 Other long term (current) drug therapy: Secondary | ICD-10-CM | POA: Diagnosis not present

## 2017-01-02 DIAGNOSIS — Z880 Allergy status to penicillin: Secondary | ICD-10-CM | POA: Insufficient documentation

## 2017-01-02 DIAGNOSIS — Z9889 Other specified postprocedural states: Secondary | ICD-10-CM | POA: Insufficient documentation

## 2017-01-02 DIAGNOSIS — M25519 Pain in unspecified shoulder: Secondary | ICD-10-CM

## 2017-01-02 DIAGNOSIS — M25512 Pain in left shoulder: Secondary | ICD-10-CM | POA: Insufficient documentation

## 2017-01-02 LAB — GLUCOSE, POCT (MANUAL RESULT ENTRY): POC Glucose: 98 mg/dl (ref 70–99)

## 2017-01-02 MED ORDER — TIZANIDINE HCL 2 MG PO CAPS: 2.0000 mg | ORAL_CAPSULE | Freq: Two times a day (BID) | ORAL | 0 refills | Status: AC

## 2017-01-02 MED ORDER — ALLOPURINOL 100 MG PO TABS
100.0000 mg | ORAL_TABLET | Freq: Every day | ORAL | 2 refills | Status: DC
Start: 2017-01-02 — End: 2017-04-20

## 2017-01-02 NOTE — Progress Notes (Signed)
Assessment & Plan:  Hayley Case was seen today for hypertension.  Diagnoses and all orders for this visit:  Essential hypertension Continue all antihypertensives as prescribed.  Remember to bring in your blood pressure log with you for your follow up appointment.  DASH/Mediterranean Diets are healthier choices for HTN.  Follow up in January   Neck and shoulder pain -     tizanidine (ZANAFLEX) 2 MG capsule; Take 1 capsule (2 mg total) by mouth 2 (two) times daily for 14 days. May use heat application to affected area May alternate with acetaminophen and ibuprofen for pain.  Follow up in January  Suggested she move the TV out of the room or hang on wall. She could also use a lamp for lighting in the room instead of the TV light which is not beneficial for restful sleep.   Type 2 diabetes mellitus without complication, without long-term current use of insulin (HCC) -     Glucose (CBG) Advised patient to keep a fasting blood sugar log fast, 2 hours post lunch and bedtime which will be reviewed at the next office visit.    Patient has been counseled on age-appropriate routine health concerns for screening and prevention. These are reviewed and up-to-date. Referrals have been placed accordingly. Immunizations are up-to-date or declined.    Subjective:   Chief Complaint  Patient presents with  . Hypertension    Patient is here for a blood pressure check. Patient stated she is having pain on her left shoulder down to her arm, and may think its due to the way she's sleeping.    HPI Hayley Case 66 y.o. female presents to office today for blood pressure recheck.   Shoulder Pain Patient complaints of left shoulder and neck pain. TThe pain is described as aching and sharp.  The onset of the pain was gradual.  The pain occurs continuously and lasts most of the day. .  Location is lateral, neck. No history of dislocation. Symptoms are aggravated by all activities. Symptoms are diminished by   rest.   Limited activities include: no limitations. mild stiffness is reported. Patient is retired and reports the side of her neck and shoulder where she is experiencing the pain is also the same side she lays on at night watching TV. She sleeps with the TV on and feels this is contributing to her pain due to her positioning.   Essential Hypertension Blood pressure looks great today. She denies chest pain, shortness of breath, palpitations, headaches or BLE edema. She endorses medication compliance. Her son has recently been diagnosed with heart disease and a few other health issues. She has tried to make heal BP Readings from Last 3 Encounters:  01/02/17 138/73  12/08/16 (!) 149/83  10/30/16 135/78    Review of Systems  Constitutional: Negative for fever, malaise/fatigue and weight loss.  Eyes: Negative for blurred vision.  Respiratory: Negative.  Negative for cough and shortness of breath.   Cardiovascular: Negative.  Negative for chest pain, palpitations and leg swelling.  Gastrointestinal: Negative.  Negative for abdominal pain, constipation, diarrhea, heartburn, nausea and vomiting.  Genitourinary:       Stress incontinence  Musculoskeletal: Positive for myalgias and neck pain.  Neurological: Negative.  Negative for dizziness, focal weakness, seizures and headaches.  Psychiatric/Behavioral: Negative.  Negative for suicidal ideas.    Past Medical History:  Diagnosis Date  . Diabetes mellitus without complication ([place])   . Hypertension     Past Surgical History:  Procedure Laterality  Date  . BREAST BIOPSY      History reviewed. No pertinent family history.  Social History Reviewed with no changes to be made today.   Outpatient Medications Prior to Visit  Medication Sig Dispense Refill  . allopurinol (ZYLOPRIM) 100 MG tablet Take 1 tablet (100 mg total) by mouth daily. 30 tablet 2  . amLODipine (NORVASC) 10 MG tablet Take 1 tablet (10 mg total) by mouth daily. 90 tablet 1    . atorvastatin (LIPITOR) 40 MG tablet Take 1 tablet (40 mg total) by mouth daily. 30 tablet 2  . Dulaglutide (TRULICITY) 1.5 EX/5.1ZG SOPN Inject 1.5 mg into the skin once a week. 4 pen 2  . glucose blood test strip Use as instructed 100 each 12  . glucose blood test strip Use as instructed 100 each 12  . insulin aspart (NOVOLOG FLEXPEN) 100 UNIT/ML FlexPen Inject 5 units before lunch and dinner. 15 mL 0  . Insulin Glargine (LANTUS SOLOSTAR) 100 UNIT/ML Solostar Pen Inject 30 Units into the skin 2 (two) times daily. 5 pen 2  . Insulin Pen Needle (B-D UF III MINI PEN NEEDLES) 31G X 5 MM MISC Use as directed 100 each 5  . losartan-hydrochlorothiazide (HYZAAR) 100-25 MG tablet Take 1 tablet by mouth daily. 90 tablet 1  . metoprolol succinate (TOPROL-XL) 100 MG 24 hr tablet Take 100 mg by mouth daily. Take with or immediately following a meal.    . ONETOUCH DELICA LANCETS 01V MISC Check blood sugar fasting and at bedtime and some days after lunch 100 each 1   No facility-administered medications prior to visit.     Allergies  Allergen Reactions  . Penicillins   . Septra [Sulfamethoxazole-Trimethoprim]        Objective:    BP 138/73 (BP Location: Left Arm, Patient Position: Sitting, Cuff Size: Normal)   Pulse 87   Temp 98.3 F (36.8 C) (Oral)   Ht 5\' 4"  (1.626 m)   Wt 212 lb 12.8 oz (96.5 kg)   SpO2 98%   BMI 36.53 kg/m  Wt Readings from Last 3 Encounters:  01/02/17 212 lb 12.8 oz (96.5 kg)  12/08/16 211 lb 12.8 oz (96.1 kg)  10/15/16 208 lb 3.2 oz (94.4 kg)    Physical Exam  Constitutional: She is oriented to person, place, and time. She appears well-developed and well-nourished. She is cooperative.  HENT:  Head: Normocephalic and atraumatic.  Eyes: EOM are normal.  Neck: Normal range of motion.  Cardiovascular: Normal rate, regular rhythm, normal heart sounds and intact distal pulses. Exam reveals no gallop and no friction rub.  No murmur heard. Pulmonary/Chest: Effort  normal and breath sounds normal. No tachypnea. No respiratory distress. She has no decreased breath sounds. She has no wheezes. She has no rhonchi. She has no rales. She exhibits no tenderness.  Abdominal: Soft. Bowel sounds are normal.  Musculoskeletal: Normal range of motion. She exhibits no edema.  Neurological: She is alert and oriented to person, place, and time. Coordination normal.  Skin: Skin is warm and dry.  Psychiatric: She has a normal mood and affect. Her behavior is normal. Judgment and thought content normal.  Nursing note and vitals reviewed.     Patient has been counseled extensively about nutrition and exercise as well as the importance of adherence with medications and regular follow-up. The patient was given clear instructions to go to ER or return to medical center if symptoms don't improve, worsen or new problems develop. The patient verbalized understanding.  Follow-up: No Follow-up on file.   Gildardo Pounds, FNP-BC [facility] and [place], [place]   01/02/2017, 1:04 PM

## 2017-01-02 NOTE — Telephone Encounter (Signed)
Pt called to request a refill on  -allopurinol (ZYLOPRIM) 100 MG tablet  Please follow up

## 2017-01-02 NOTE — Telephone Encounter (Signed)
Medication has been sent to [facility]

## 2017-01-20 ENCOUNTER — Other Ambulatory Visit: Payer: Self-pay | Admitting: Pharmacist

## 2017-01-20 ENCOUNTER — Telehealth: Payer: Self-pay | Admitting: Nurse Practitioner

## 2017-01-20 MED ORDER — LOSARTAN POTASSIUM-HCTZ 100-25 MG PO TABS: 1.0000 | ORAL_TABLET | Freq: Every day | ORAL | 0 refills | Status: DC

## 2017-01-20 NOTE — Telephone Encounter (Signed)
Refilled

## 2017-01-20 NOTE — Telephone Encounter (Signed)
Pt. Called requesting a refill on losartan-hydrochlorothiazide (HYZAAR) 100-25 MG tablet  Pt. Uses [facility] [address].  Please f/u

## 2017-02-04 ENCOUNTER — Other Ambulatory Visit: Payer: Self-pay | Admitting: Family Medicine

## 2017-02-06 ENCOUNTER — Other Ambulatory Visit: Payer: Self-pay | Admitting: Family Medicine

## 2017-02-09 ENCOUNTER — Encounter: Payer: Self-pay | Admitting: Nurse Practitioner

## 2017-02-09 ENCOUNTER — Other Ambulatory Visit (HOSPITAL_COMMUNITY)
Admission: RE | Admit: 2017-02-09 | Discharge: 2017-02-09 | Disposition: A | Discharge status: Home / Self Care | Payer: Medicare Other | Source: Ambulatory Visit | Attending: Nurse Practitioner | Admitting: Nurse Practitioner

## 2017-02-09 ENCOUNTER — Telehealth: Payer: Self-pay | Admitting: Nurse Practitioner

## 2017-02-09 ENCOUNTER — Ambulatory Visit: Payer: Medicare Other | Attending: Nurse Practitioner | Admitting: Nurse Practitioner

## 2017-02-09 VITALS — BP 144/84 | HR 83 | Temp 97.8°F | Ht 64.0 in | Wt 214.8 lb

## 2017-02-09 DIAGNOSIS — I10 Essential (primary) hypertension: Secondary | ICD-10-CM | POA: Diagnosis not present

## 2017-02-09 DIAGNOSIS — Z79899 Other long term (current) drug therapy: Secondary | ICD-10-CM | POA: Diagnosis not present

## 2017-02-09 DIAGNOSIS — E1169 Type 2 diabetes mellitus with other specified complication: Secondary | ICD-10-CM | POA: Diagnosis not present

## 2017-02-09 DIAGNOSIS — Z882 Allergy status to sulfonamides status: Secondary | ICD-10-CM | POA: Diagnosis not present

## 2017-02-09 DIAGNOSIS — Z01419 Encounter for gynecological examination (general) (routine) without abnormal findings: Secondary | ICD-10-CM | POA: Insufficient documentation

## 2017-02-09 DIAGNOSIS — M25511 Pain in right shoulder: Secondary | ICD-10-CM

## 2017-02-09 DIAGNOSIS — R300 Dysuria: Secondary | ICD-10-CM

## 2017-02-09 DIAGNOSIS — E785 Hyperlipidemia, unspecified: Secondary | ICD-10-CM | POA: Insufficient documentation

## 2017-02-09 DIAGNOSIS — Z794 Long term (current) use of insulin: Secondary | ICD-10-CM | POA: Diagnosis not present

## 2017-02-09 DIAGNOSIS — Z0000 Encounter for general adult medical examination without abnormal findings: Secondary | ICD-10-CM | POA: Diagnosis not present

## 2017-02-09 DIAGNOSIS — Z1231 Encounter for screening mammogram for malignant neoplasm of breast: Secondary | ICD-10-CM

## 2017-02-09 DIAGNOSIS — Z13820 Encounter for screening for osteoporosis: Secondary | ICD-10-CM | POA: Diagnosis not present

## 2017-02-09 DIAGNOSIS — E119 Type 2 diabetes mellitus without complications: Secondary | ICD-10-CM

## 2017-02-09 DIAGNOSIS — Z880 Allergy status to penicillin: Secondary | ICD-10-CM | POA: Insufficient documentation

## 2017-02-09 LAB — POCT URINALYSIS DIPSTICK
Bilirubin, UA: NEGATIVE
Glucose, UA: NEGATIVE
Ketones, UA: NEGATIVE
Nitrite, UA: NEGATIVE
Spec Grav, UA: 1.015 (ref 1.010–1.025)
Urobilinogen, UA: 0.2 E.U./dL
pH, UA: 5 (ref 5.0–8.0)

## 2017-02-09 LAB — GLUCOSE, POCT (MANUAL RESULT ENTRY): POC Glucose: 74 mg/dl (ref 70–99)

## 2017-02-09 LAB — POCT GLYCOSYLATED HEMOGLOBIN (HGB A1C): Hemoglobin A1C: 7.6

## 2017-02-09 MED ORDER — GLUCOSE BLOOD VI STRP: ORAL_STRIP | 12 refills | Status: DC

## 2017-02-09 NOTE — Telephone Encounter (Signed)
Pt called to request a fill on Accu-check to check blood sugar  , then she wont have to pay the copay for the medication If approved please send to  -[place], [place] [address] AT [address]

## 2017-02-09 NOTE — Patient Instructions (Addendum)
Health Maintenance for Postmenopausal Women Menopause is a normal process in which your reproductive ability comes to an end. This process happens gradually over a span of months to years, usually between the ages of 22 and 9. Menopause is complete when you have missed 12 consecutive menstrual periods. It is important to talk with your health care provider about some of the most common conditions that affect postmenopausal women, such as heart disease, cancer, and bone loss (osteoporosis). Adopting a healthy lifestyle and getting preventive care can help to promote your health and wellness. Those actions can also lower your chances of developing some of these common conditions. What should I know about menopause? During menopause, you may experience a number of symptoms, such as:  Moderate-to-severe hot flashes.  Night sweats.  Decrease in sex drive.  Mood swings.  Headaches.  Tiredness.  Irritability.  Memory problems.  Insomnia.  Choosing to treat or not to treat menopausal changes is an individual decision that you make with your health care provider. What should I know about hormone replacement therapy and supplements? Hormone therapy products are effective for treating symptoms that are associated with menopause, such as hot flashes and night sweats. Hormone replacement carries certain risks, especially as you become older. If you are thinking about using estrogen or estrogen with progestin treatments, discuss the benefits and risks with your health care provider. What should I know about heart disease and stroke? Heart disease, heart attack, and stroke become more likely as you age. This may be due, in part, to the hormonal changes that your body experiences during menopause. These can affect how your body processes dietary fats, triglycerides, and cholesterol. Heart attack and stroke are both medical emergencies. There are many things that you can do to help prevent heart disease  and stroke:  Have your blood pressure checked at least every 1-2 years. High blood pressure causes heart disease and increases the risk of stroke.  If you are 53-22 years old, ask your health care provider if you should take aspirin to prevent a heart attack or a stroke.  Do not use any tobacco products, including cigarettes, chewing tobacco, or electronic cigarettes. If you need help quitting, ask your health care provider.  It is important to eat a healthy diet and maintain a healthy weight. ? Be sure to include plenty of vegetables, fruits, low-fat dairy products, and lean protein. ? Avoid eating foods that are high in solid fats, added sugars, or salt (sodium).  Get regular exercise. This is one of the most important things that you can do for your health. ? Try to exercise for at least 150 minutes each week. The type of exercise that you do should increase your heart rate and make you sweat. This is known as moderate-intensity exercise. ? Try to do strengthening exercises at least twice each week. Do these in addition to the moderate-intensity exercise.  Know your numbers.Ask your health care provider to check your cholesterol and your blood glucose. Continue to have your blood tested as directed by your health care provider.  What should I know about cancer screening? There are several types of cancer. Take the following steps to reduce your risk and to catch any cancer development as early as possible. Breast Cancer  Practice breast self-awareness. ? This means understanding how your breasts normally appear and feel. ? It also means doing regular breast self-exams. Let your health care provider know about any changes, no matter how small.  If you are 40  or older, have a clinician do a breast exam (clinical breast exam or CBE) every year. Depending on your age, family history, and medical history, it may be recommended that you also have a yearly breast X-ray (mammogram).  If you  have a family history of breast cancer, talk with your health care provider about genetic screening.  If you are at high risk for breast cancer, talk with your health care provider about having an MRI and a mammogram every year.  Breast cancer (BRCA) gene test is recommended for women who have family members with BRCA-related cancers. Results of the assessment will determine the need for genetic counseling and BRCA1 and for BRCA2 testing. BRCA-related cancers include these types: ? Breast. This occurs in males or females. ? Ovarian. ? Tubal. This may also be called fallopian tube cancer. ? Cancer of the abdominal or pelvic lining (peritoneal cancer). ? Prostate. ? Pancreatic.  Cervical, Uterine, and Ovarian Cancer Your health care provider may recommend that you be screened regularly for cancer of the pelvic organs. These include your ovaries, uterus, and vagina. This screening involves a pelvic exam, which includes checking for microscopic changes to the surface of your cervix (Pap test).  For women ages 21-65, health care providers may recommend a pelvic exam and a Pap test every three years. For women ages 79-65, they may recommend the Pap test and pelvic exam, combined with testing for human papilloma virus (HPV), every five years. Some types of HPV increase your risk of cervical cancer. Testing for HPV may also be done on women of any age who have unclear Pap test results.  Other health care providers may not recommend any screening for nonpregnant women who are considered low risk for pelvic cancer and have no symptoms. Ask your health care provider if a screening pelvic exam is right for you.  If you have had past treatment for cervical cancer or a condition that could lead to cancer, you need Pap tests and screening for cancer for at least 20 years after your treatment. If Pap tests have been discontinued for you, your risk factors (such as having a new sexual partner) need to be  reassessed to determine if you should start having screenings again. Some women have medical problems that increase the chance of getting cervical cancer. In these cases, your health care provider may recommend that you have screening and Pap tests more often.  If you have a family history of uterine cancer or ovarian cancer, talk with your health care provider about genetic screening.  If you have vaginal bleeding after reaching menopause, tell your health care provider.  There are currently no reliable tests available to screen for ovarian cancer.  Lung Cancer Lung cancer screening is recommended for adults 69-62 years old who are at high risk for lung cancer because of a history of smoking. A yearly low-dose CT scan of the lungs is recommended if you:  Currently smoke.  Have a history of at least 30 pack-years of smoking and you currently smoke or have quit within the past 15 years. A pack-year is smoking an average of one pack of cigarettes per day for one year.  Yearly screening should:  Continue until it has been 15 years since you quit.  Stop if you develop a health problem that would prevent you from having lung cancer treatment.  Colorectal Cancer  This type of cancer can be detected and can often be prevented.  Routine colorectal cancer screening usually begins at  age 42 and continues through age 45.  If you have risk factors for colon cancer, your health care provider may recommend that you be screened at an earlier age.  If you have a family history of colorectal cancer, talk with your health care provider about genetic screening.  Your health care provider may also recommend using home test kits to check for hidden blood in your stool.  A small camera at the end of a tube can be used to examine your colon directly (sigmoidoscopy or colonoscopy). This is done to check for the earliest forms of colorectal cancer.  Direct examination of the colon should be repeated every  5-10 years until age 71. However, if early forms of precancerous polyps or small growths are found or if you have a family history or genetic risk for colorectal cancer, you may need to be screened more often.  Skin Cancer  Check your skin from head to toe regularly.  Monitor any moles. Be sure to tell your health care provider: ? About any new moles or changes in moles, especially if there is a change in a mole's shape or color. ? If you have a mole that is larger than the size of a pencil eraser.  If any of your family members has a history of skin cancer, especially at a young age, talk with your health care provider about genetic screening.  Always use sunscreen. Apply sunscreen liberally and repeatedly throughout the day.  Whenever you are outside, protect yourself by wearing long sleeves, pants, a wide-brimmed hat, and sunglasses.  What should I know about osteoporosis? Osteoporosis is a condition in which bone destruction happens more quickly than new bone creation. After menopause, you may be at an increased risk for osteoporosis. To help prevent osteoporosis or the bone fractures that can happen because of osteoporosis, the following is recommended:  If you are 46-71 years old, get at least 1,000 mg of calcium and at least 600 mg of vitamin D per day.  If you are older than age 55 but younger than age 65, get at least 1,200 mg of calcium and at least 600 mg of vitamin D per day.  If you are older than age 54, get at least 1,200 mg of calcium and at least 800 mg of vitamin D per day.  Smoking and excessive alcohol intake increase the risk of osteoporosis. Eat foods that are rich in calcium and vitamin D, and do weight-bearing exercises several times each week as directed by your health care provider. What should I know about how menopause affects my mental health? Depression may occur at any age, but it is more common as you become older. Common symptoms of depression  include:  Low or sad mood.  Changes in sleep patterns.  Changes in appetite or eating patterns.  Feeling an overall lack of motivation or enjoyment of activities that you previously enjoyed.  Frequent crying spells.  Talk with your health care provider if you think that you are experiencing depression. What should I know about immunizations? It is important that you get and maintain your immunizations. These include:  Tetanus, diphtheria, and pertussis (Tdap) booster vaccine.  Influenza every year before the flu season begins.  Pneumonia vaccine.  Shingles vaccine.  Your health care provider may also recommend other immunizations. This information is not intended to replace advice given to you by your health care provider. Make sure you discuss any questions you have with your health care provider. Document Released: 02/21/2005  Document Revised: 07/20/2015 Document Reviewed: 10/03/2014 Elsevier Interactive Patient Education  2018 [employer]  

## 2017-02-09 NOTE — Progress Notes (Signed)
Assessment & Plan:  Hayley Case was seen today for annual exam.  Diagnoses and all orders for this visit:  Well woman exam with routine gynecological exam -     Cervicovaginal ancillary only -     Cytology - PAP  Type 2 diabetes mellitus without complication, without long-term current use of insulin (HCC) -     Glucose (CBG) -     HgB A1c -     glucose blood test strip; Use as instructed  INSTRUCTIONS: Diabetes Mellitus, Type 2 : Controlled Continue medications. Keep blood sugar logs as instructed. A1C GREATLY IMPROVED. Patient was praised for her hard work in lowering A1C  Dysuria -     Urinalysis Dipstick -     Urine Culture Wipe from front to back after elimination  Hyperlipidemia associated with type 2 diabetes mellitus ([place]) -     Lipid panel INSTRUCTIONS: Work on a low fat, heart healthy diet and participate in regular aerobic exercise program to control as well by working out at least 150 minutes per week. No fried foods. No junk foods, sodas, sugary drinks, unhealthy snacking, or smoking.   Acute pain of right shoulder Likely arthritic in nature. Try XS Tylenol or Tylenol arthritis for pain relief. Apply heat to shoulder as needed for pain relief.     Patient has been counseled on age-appropriate routine health concerns for screening and prevention. These are reviewed and up-to-date. Referrals have been placed accordingly. Immunizations are up-to-date or declined.    Subjective:   Chief Complaint  Patient presents with  . Annual Exam    Patient is here for a physical. Patient stated she sometimes get heacache. Patient stated she also sometimes have lower back pain and discomfort when she urinates.    HPI Hayley Case 67 y.o. female presents to office today for well woman exam. She has complaints of low back pain and dysuria.   Right Shoulder Pain Onset a few weeks ago. Pain described as achy. She denies any injury or trauma.  She has tried a muscle relaxant  with little relief of pain. She has full mobility and ROM of right arm. Aggravating factors: lifting, stretching. Relieving factors: None; pain goes away on its own.  Dysuria  Patient complains of burning with urination and dysuria She has had symptoms for several days. Patient also complains of back pain. Patient denies fever, vaginal discharge and hematuria. Patient does have a history of recurrent UTI.  Patient does not have a history of pyelonephritis.    Review of Systems  Constitutional: Negative.  Negative for chills, fever, malaise/fatigue and weight loss.  HENT: Negative.  Negative for congestion, hearing loss, sinus pain and sore throat.   Eyes: Negative.  Negative for blurred vision, double vision, photophobia and pain.  Respiratory: Negative.  Negative for cough, sputum production, shortness of breath and wheezing.   Cardiovascular: Negative.  Negative for chest pain and leg swelling.  Gastrointestinal: Negative.  Negative for abdominal pain, constipation, diarrhea, heartburn, nausea and vomiting.  Genitourinary: Positive for dysuria. Negative for flank pain, frequency, hematuria and urgency.  Musculoskeletal: Positive for back pain. Negative for joint pain and myalgias.       SEE HPI  Skin: Negative.  Negative for rash.  Neurological: Negative.  Negative for dizziness, tremors, speech change, focal weakness, seizures and headaches.  Endo/Heme/Allergies: Negative.  Negative for environmental allergies.  Psychiatric/Behavioral: Negative.  Negative for depression and suicidal ideas. The patient is not nervous/anxious and does not have insomnia.  Past Medical History:  Diagnosis Date  . Diabetes mellitus without complication ([place])   . Hypertension     Past Surgical History:  Procedure Laterality Date  . BREAST BIOPSY      History reviewed. No pertinent family history.  Social History Reviewed with no changes to be made today.   Outpatient Medications Prior to Visit    Medication Sig Dispense Refill  . allopurinol (ZYLOPRIM) 100 MG tablet Take 1 tablet (100 mg total) by mouth daily. 30 tablet 2  . amLODipine (NORVASC) 10 MG tablet Take 1 tablet (10 mg total) by mouth daily. 90 tablet 1  . atorvastatin (LIPITOR) 40 MG tablet Take 1 tablet (40 mg total) by mouth daily. 30 tablet 2  . Insulin Pen Needle (B-D UF III MINI PEN NEEDLES) 31G X 5 MM MISC Use as directed 100 each 5  . LANTUS SOLOSTAR 100 UNIT/ML Solostar Pen ADMINISTER 30 UNITS UNDER THE SKIN TWICE DAILY 15 mL 2  . losartan-hydrochlorothiazide (HYZAAR) 100-25 MG tablet Take 1 tablet by mouth daily. 90 tablet 0  . metoprolol succinate (TOPROL-XL) 100 MG 24 hr tablet Take 100 mg by mouth daily. Take with or immediately following a meal.    . ONETOUCH DELICA LANCETS 67E MISC Check blood sugar fasting and at bedtime and some days after lunch 938 each 1  . TRULICITY 1.5 BO/1.7PZ SOPN INJECT 1.5MG  INTO THE SKIN ONCE A WEEK 2 mL 0  . glucose blood test strip Use as instructed 100 each 12  . glucose blood test strip Use as instructed 100 each 12  . insulin aspart (NOVOLOG FLEXPEN) 100 UNIT/ML FlexPen Inject 5 units before lunch and dinner. (Patient not taking: Reported on 02/09/2017) 15 mL 0   No facility-administered medications prior to visit.     Allergies  Allergen Reactions  . Penicillins   . Septra [Sulfamethoxazole-Trimethoprim]        Objective:    BP (!) 144/84 (BP Location: Left Arm, Patient Position: Sitting, Cuff Size: Normal)   Pulse 83   Temp 97.8 F (36.6 C) (Oral)   Ht 5\' 4"  (1.626 m)   Wt 214 lb 12.8 oz (97.4 kg)   SpO2 100%   BMI 36.87 kg/m  Wt Readings from Last 3 Encounters:  02/09/17 214 lb 12.8 oz (97.4 kg)  01/02/17 212 lb 12.8 oz (96.5 kg)  12/08/16 211 lb 12.8 oz (96.1 kg)    Physical Exam  Constitutional: She is oriented to person, place, and time. She appears well-developed and well-nourished. No distress.  HENT:  Head: Normocephalic and atraumatic.  Right  Ear: External ear normal.  Left Ear: External ear normal.  Nose: Nose normal.  Mouth/Throat: Oropharynx is clear and moist. No oropharyngeal exudate.  Eyes: Conjunctivae and EOM are normal. Pupils are equal, round, and reactive to light. Right eye exhibits no discharge. Left eye exhibits no discharge. No scleral icterus.  Neck: Normal range of motion. Neck supple. No tracheal deviation present. No thyromegaly present.  Cardiovascular: Normal rate, regular rhythm, normal heart sounds and intact distal pulses. Exam reveals no friction rub.  No murmur heard. Pulmonary/Chest: Effort normal and breath sounds normal. No respiratory distress. She has no decreased breath sounds. She has no wheezes. She has no rhonchi. She has no rales. Chest wall is not dull to percussion. She exhibits no mass and no tenderness. Right breast exhibits no inverted nipple, no mass, no nipple discharge, no skin change and no tenderness. Left breast exhibits no inverted nipple, no mass, no  nipple discharge, no skin change and no tenderness.  Abdominal: Soft. Bowel sounds are normal. She exhibits no distension and no mass. There is no tenderness. There is no rebound and no guarding. Hernia confirmed negative in the right inguinal area and confirmed negative in the left inguinal area.  Genitourinary: Vagina normal. Rectal exam shows no external hemorrhoid, no fissure, no mass and anal tone normal. No breast swelling, tenderness, discharge or bleeding. No labial fusion. There is no rash, tenderness, lesion or injury on the right labia. There is no rash, tenderness, lesion or injury on the left labia. Uterus is not tender. Cervix exhibits no motion tenderness, no discharge and no friability. Right adnexum displays no mass, no tenderness and no fullness. Left adnexum displays no mass, no tenderness and no fullness. No erythema, tenderness or bleeding in the vagina. No vaginal discharge found.  Musculoskeletal: Normal range of motion. She  exhibits no edema, tenderness or deformity.       Right shoulder: She exhibits normal range of motion, no tenderness, no bony tenderness and no swelling.  Lymphadenopathy:    She has no cervical adenopathy.  Neurological: She is alert and oriented to person, place, and time. No cranial nerve deficit. Coordination normal.  Skin: Skin is warm and dry. No rash noted. She is not diaphoretic. No erythema. No pallor.  Sensory exam of the foot is normal, tested with the monofilament. Good pulses, no lesions or ulcers, good peripheral pulses.  Psychiatric: She has a normal mood and affect. Her behavior is normal. Judgment and thought content normal.         Patient has been counseled extensively about nutrition and exercise as well as the importance of adherence with medications and regular follow-up. The patient was given clear instructions to go to ER or return to medical center if symptoms don't improve, worsen or new problems develop. The patient verbalized understanding.   Follow-up: Return in about 3 months (around 05/10/2017) for HTN/HPL/DM.   Gildardo Pounds, FNP-BC [facility], [place]   02/09/2017, 1:31 PM

## 2017-02-10 ENCOUNTER — Other Ambulatory Visit: Payer: Self-pay

## 2017-02-10 DIAGNOSIS — E119 Type 2 diabetes mellitus without complications: Secondary | ICD-10-CM

## 2017-02-10 LAB — LIPID PANEL
Chol/HDL Ratio: 3.9 ratio (ref 0.0–4.4)
Cholesterol, Total: 201 mg/dL — ABNORMAL HIGH (ref 100–199)
HDL: 52 mg/dL (ref >39)
LDL Calculated: 124 mg/dL — ABNORMAL HIGH (ref 0–99)
Triglycerides: 125 mg/dL (ref 0–149)
VLDL Cholesterol Cal: 25 mg/dL (ref 5–40)

## 2017-02-10 LAB — CERVICOVAGINAL ANCILLARY ONLY
Bacterial vaginitis: NEGATIVE
Candida vaginitis: NEGATIVE

## 2017-02-10 MED ORDER — ACCU-CHEK AVIVA PLUS W/DEVICE KIT: PACK | 0 refills | Status: DC

## 2017-02-10 MED ORDER — GLUCOSE BLOOD VI STRP: ORAL_STRIP | 12 refills | Status: DC

## 2017-02-10 MED ORDER — ACCU-CHEK SOFT TOUCH LANCETS MISC: 12 refills | Status: DC

## 2017-02-10 NOTE — Telephone Encounter (Signed)
Script sent  

## 2017-02-10 NOTE — Telephone Encounter (Signed)
Patient request Accu-Check meter, Accu-Chek strips, Accu-Chek lancets send to her pharmacy.  Rx sent.

## 2017-02-11 ENCOUNTER — Telehealth: Payer: Self-pay

## 2017-02-11 LAB — CYTOLOGY - PAP
Diagnosis: NEGATIVE — AB
Diagnosis: REACTIVE — AB

## 2017-02-11 LAB — URINE CULTURE: Organism ID, Bacteria: NO GROWTH

## 2017-02-11 NOTE — Telephone Encounter (Signed)
-----   Message from Gildardo Pounds, NP sent at 02/11/2017 12:46 AM EST ----- Please call patient: Tests show increased cholesterol/ldl levels. Patient should work on a low fat, heart healthy diet and participate in regular aerobic exercise program to control as well by working out at least 150 minutes per week. No fried foods. No junk foods, sodas, sugary drinks, unhealthy snacking, or smoking. She should take her lipitor at night.

## 2017-02-11 NOTE — Telephone Encounter (Signed)
Noted  

## 2017-02-11 NOTE — Telephone Encounter (Signed)
CMA called patient to inform on lab results and PCP advising.   Patient understood.  Patient would like to know if she can get a generic brand of her stirps and lancets for a cheaper price with her insurance.

## 2017-02-12 NOTE — Telephone Encounter (Signed)
Can she call her insurance and see what is on their preferred list and then let us know

## 2017-02-13 ENCOUNTER — Telehealth: Payer: Self-pay

## 2017-02-13 MED ORDER — ACCU-CHEK SOFT TOUCH LANCETS MISC: 12 refills | Status: DC

## 2017-02-13 NOTE — Telephone Encounter (Signed)
Patient stated Accu-check was the brand her insurance suggest her to get.  CMA already send the Accu-check meters, strips, and lancets to patient's pharmacy.

## 2017-02-13 NOTE — Telephone Encounter (Signed)
Patient stated she did not receives her lancets.   CMA resent the Rx to her prefer pharmacy for her lancets.

## 2017-02-13 NOTE — Telephone Encounter (Signed)
CMA called patient to inform on lab results.  Patient understood.

## 2017-02-13 NOTE — Telephone Encounter (Signed)
-----   Message from Gildardo Pounds, NP sent at 02/12/2017  1:51 AM EST ----- Pap smear is normal

## 2017-02-17 ENCOUNTER — Other Ambulatory Visit: Payer: Self-pay | Admitting: Nurse Practitioner

## 2017-02-17 MED ORDER — GLUCOSE BLOOD VI STRP: ORAL_STRIP | 12 refills | Status: DC

## 2017-02-17 MED ORDER — LANCETS THIN MISC: 3 refills | Status: DC

## 2017-02-17 NOTE — Telephone Encounter (Signed)
I have sent generic strips and lancets to the pharmacy

## 2017-03-18 ENCOUNTER — Ambulatory Visit
Admission: RE | Admit: 2017-03-18 | Discharge: 2017-03-18 | Disposition: A | Discharge status: Home / Self Care | Payer: Medicare Other | Source: Ambulatory Visit | Attending: Nurse Practitioner | Admitting: Nurse Practitioner

## 2017-03-18 DIAGNOSIS — Z1231 Encounter for screening mammogram for malignant neoplasm of breast: Secondary | ICD-10-CM

## 2017-03-26 ENCOUNTER — Other Ambulatory Visit: Payer: Self-pay | Admitting: Nurse Practitioner

## 2017-03-26 DIAGNOSIS — Z780 Asymptomatic menopausal state: Secondary | ICD-10-CM

## 2017-04-02 ENCOUNTER — Telehealth: Payer: Self-pay | Admitting: Nurse Practitioner

## 2017-04-02 NOTE — Telephone Encounter (Signed)
Called wanting to inform pcp and or nurse of her appeal information for [facility]. Please give patient a call at your earliest convenience.

## 2017-04-03 NOTE — Telephone Encounter (Signed)
CMA attempt to call patient.  No answer and left a VM asking patient to call back.

## 2017-04-06 NOTE — Telephone Encounter (Signed)
CMA called patient back. Spoke to patient, patient wanted to know why she haven't been reach out or her GI or her Ophthalmology referral. Pt. Will faxed over a appeal on her her insurance for PCP.

## 2017-04-06 NOTE — Telephone Encounter (Signed)
Pt. Returned nurse call. Please f/u with pt.

## 2017-04-18 ENCOUNTER — Other Ambulatory Visit: Payer: Self-pay | Admitting: Family Medicine

## 2017-04-24 ENCOUNTER — Other Ambulatory Visit: Payer: Self-pay | Admitting: Nurse Practitioner

## 2017-04-24 DIAGNOSIS — Z794 Long term (current) use of insulin: Secondary | ICD-10-CM

## 2017-04-24 DIAGNOSIS — E118 Type 2 diabetes mellitus with unspecified complications: Secondary | ICD-10-CM

## 2017-05-12 ENCOUNTER — Ambulatory Visit: Payer: Medicare Other | Attending: Nurse Practitioner | Admitting: Nurse Practitioner

## 2017-05-12 ENCOUNTER — Encounter: Payer: Self-pay | Admitting: Nurse Practitioner

## 2017-05-12 VITALS — BP 151/80 | HR 80 | Temp 98.0°F | Ht 64.0 in | Wt 208.8 lb

## 2017-05-12 DIAGNOSIS — Z760 Encounter for issue of repeat prescription: Secondary | ICD-10-CM | POA: Insufficient documentation

## 2017-05-12 DIAGNOSIS — M1A09X0 Idiopathic chronic gout, multiple sites, without tophus (tophi): Secondary | ICD-10-CM | POA: Diagnosis not present

## 2017-05-12 DIAGNOSIS — E1169 Type 2 diabetes mellitus with other specified complication: Secondary | ICD-10-CM | POA: Diagnosis not present

## 2017-05-12 DIAGNOSIS — Z79899 Other long term (current) drug therapy: Secondary | ICD-10-CM | POA: Diagnosis not present

## 2017-05-12 DIAGNOSIS — E119 Type 2 diabetes mellitus without complications: Secondary | ICD-10-CM | POA: Diagnosis not present

## 2017-05-12 DIAGNOSIS — M79601 Pain in right arm: Secondary | ICD-10-CM | POA: Diagnosis not present

## 2017-05-12 DIAGNOSIS — E785 Hyperlipidemia, unspecified: Secondary | ICD-10-CM | POA: Diagnosis not present

## 2017-05-12 DIAGNOSIS — Z880 Allergy status to penicillin: Secondary | ICD-10-CM | POA: Diagnosis not present

## 2017-05-12 DIAGNOSIS — E118 Type 2 diabetes mellitus with unspecified complications: Secondary | ICD-10-CM

## 2017-05-12 DIAGNOSIS — M542 Cervicalgia: Secondary | ICD-10-CM | POA: Insufficient documentation

## 2017-05-12 DIAGNOSIS — Z882 Allergy status to sulfonamides status: Secondary | ICD-10-CM | POA: Insufficient documentation

## 2017-05-12 DIAGNOSIS — Z794 Long term (current) use of insulin: Secondary | ICD-10-CM | POA: Insufficient documentation

## 2017-05-12 DIAGNOSIS — I10 Essential (primary) hypertension: Secondary | ICD-10-CM | POA: Diagnosis not present

## 2017-05-12 LAB — GLUCOSE, POCT (MANUAL RESULT ENTRY): POC Glucose: 246 mg/dl — AB (ref 70–99)

## 2017-05-12 LAB — POCT GLYCOSYLATED HEMOGLOBIN (HGB A1C): Hemoglobin A1C: 10.1

## 2017-05-12 MED ORDER — METOPROLOL SUCCINATE ER 100 MG PO TB24: 100.0000 mg | ORAL_TABLET | Freq: Every day | ORAL | 1 refills | Status: DC

## 2017-05-12 MED ORDER — INSULIN PEN NEEDLE 31G X 5 MM MISC: 5 refills | Status: DC

## 2017-05-12 MED ORDER — CYCLOBENZAPRINE HCL 10 MG PO TABS: 10.0000 mg | ORAL_TABLET | Freq: Two times a day (BID) | ORAL | 1 refills | Status: AC | PRN

## 2017-05-12 MED ORDER — DULAGLUTIDE 1.5 MG/0.5ML ~~LOC~~ SOAJ: SUBCUTANEOUS | 6 refills | Status: DC

## 2017-05-12 MED ORDER — AMLODIPINE BESYLATE 10 MG PO TABS: 10.0000 mg | ORAL_TABLET | Freq: Every day | ORAL | 1 refills | Status: DC

## 2017-05-12 MED ORDER — LOSARTAN POTASSIUM-HCTZ 100-25 MG PO TABS: 1.0000 | ORAL_TABLET | Freq: Every day | ORAL | 0 refills | Status: DC

## 2017-05-12 MED ORDER — ATORVASTATIN CALCIUM 40 MG PO TABS: 40.0000 mg | ORAL_TABLET | Freq: Every day | ORAL | 1 refills | Status: DC

## 2017-05-12 MED ORDER — INSULIN GLARGINE 100 UNIT/ML SOLOSTAR PEN: 40.0000 [IU] | PEN_INJECTOR | Freq: Two times a day (BID) | SUBCUTANEOUS | 6 refills | Status: DC

## 2017-05-12 NOTE — Progress Notes (Signed)
Assessment & Plan:  Hayley Case was seen today for follow-up, neck pain and medication refill.  Diagnoses and all orders for this visit:  Type 2 diabetes mellitus with complication, with long-term current use of insulin (HCC) -     Glucose (CBG) -     HgB A1c -     Dulaglutide (TRULICITY) 1.5 TG/6.2IR SOPN; INJECT 1.5MG INTO THE SKIN ONCE A WEEK -     Insulin Glargine (LANTUS SOLOSTAR) 100 UNIT/ML Solostar Pen; Inject 40 Units into the skin 2 (two) times daily. -     Insulin Pen Needle (B-D UF III MINI PEN NEEDLES) 31G X 5 MM MISC; Use as directed -     CMP14+EGFR Continue blood sugar control as discussed in office today, low carbohydrate diet, and regular physical exercise as tolerated, 150 minutes per week (30 min each day, 5 days per week, or 50 min 3 days per week). Keep blood sugar logs with fasting goal of 80-130 mg/dl, post prandial less than 180.  For Hypoglycemia: BS <60 and Hyperglycemia BS >400; contact the clinic ASAP. Annual eye exams and foot exams are recommended.  Essential hypertension -     losartan-hydrochlorothiazide (HYZAAR) 100-25 MG tablet; Take 1 tablet by mouth daily. -     metoprolol succinate (TOPROL-XL) 100 MG 24 hr tablet; Take 1 tablet (100 mg total) by mouth daily. Take with or immediately following a meal. -     amLODipine (NORVASC) 10 MG tablet; Take 1 tablet (10 mg total) by mouth daily. Continue all antihypertensives as prescribed.  Remember to bring in your blood pressure log with you for your follow up appointment.  DASH/Mediterranean Diets are healthier choices for HTN.    Hyperlipidemia associated with type 2 diabetes mellitus (HCC) -     atorvastatin (LIPITOR) 40 MG tablet; Take 1 tablet (40 mg total) by mouth daily. INSTRUCTIONS: Work on a low fat, heart healthy diet and participate in regular aerobic exercise program by working out at least 150 minutes per week. No fried foods. No junk foods, sodas, sugary drinks, unhealthy snacking, alcohol or  smoking.    Right arm pain -     cyclobenzaprine (FLEXERIL) 10 MG tablet; Take 1 tablet (10 mg total) by mouth 2 (two) times daily as needed for muscle spasms. May also try heat application for relief   Chronic gout of multiple sites, unspecified cause -     Uric Acid    Patient has been counseled on age-appropriate routine health concerns for screening and prevention. These are reviewed and up-to-date. Referrals have been placed accordingly. Immunizations are up-to-date or declined.    Subjective:   Chief Complaint  Patient presents with  . Follow-up    Pt. is here for a follow-up for hypertension, diabetes, and cholesterol.   . Neck Pain    Pt. stated she have neck and shoulder pain on her right side.   . Medication Refill   HPI Hayley Case 67 y.o. female presents to office today for follow up with HTN, DM, Lipids.  She was seen by urgent care a few weeks ago for right arm pain.  She feels as if she may have pulled a muscle from lifting a case of water from the car and carrying it into her home. She was prescribed naproxen and Flexeril.  I have instructed her to discontinue naproxen due to chronic renal disease and will refill her Flexeril as she continues to endorse right arm myalgia.   DM Type 2 Chronic.  Not well controlled. She has not been able to pick up any of her insulins in over a month due to recently increased costs. I have instructed her to follow up with her insurance provider to see if they will allow a more feasible way to help refill her medications such as mail order. A1c up from 7.6 to 10.1 in 3 months. She has packed away her glucometer so she has not been checking her blood sugars. She denies any current hypo or hyperglycemic symptoms. She has been more concerned about her son who has poorly controlled diabetes and is to begin Hemodialysis soon.  Lab Results  Component Value Date   HGBA1C 10.1 05/12/2017    Essential Hypertension Chronic. Poorly controlled  today. She endorses medication copmlkaince. WIll recheck CMP/GFR and recheck BP in a few weeks. May need to discontinue hyzaar based on renal function. She has chronic renal disease. May also need renal consult. Denies chest pain, shortness of breath, palpitations, lightheadedness, dizziness, headaches or BLE edema.  BP Readings from Last 3 Encounters:  05/12/17 (!) 151/80  02/09/17 (!) 144/84  01/02/17 138/73    Hyperlipidemia Patient presents for follow up to hyperlipidemia.  She is medication compliant. She is not diet compliant and denies dyspnea, exertional chest pressure/discomfort and tachypnea or statin intolerance including myalgias.  Lab Results  Component Value Date   CHOL 201 (H) 02/09/2017   Lab Results  Component Value Date   HDL 52 02/09/2017   Lab Results  Component Value Date   LDLCALC 124 (H) 02/09/2017   Lab Results  Component Value Date   TRIG 125 02/09/2017   Lab Results  Component Value Date   CHOLHDL 3.9 02/09/2017    Review of Systems  Constitutional: Negative for fever, malaise/fatigue and weight loss.  HENT: Negative.  Negative for nosebleeds.   Eyes: Negative.  Negative for blurred vision, double vision and photophobia.  Respiratory: Negative.  Negative for cough and shortness of breath.   Cardiovascular: Negative.  Negative for chest pain, palpitations and leg swelling.  Gastrointestinal: Negative.  Negative for heartburn, nausea and vomiting.  Musculoskeletal: Positive for myalgias (right arm) and neck pain. Negative for joint pain.  Neurological: Negative.  Negative for dizziness, focal weakness, seizures and headaches.  Psychiatric/Behavioral: Negative.  Negative for suicidal ideas.    Past Medical History:  Diagnosis Date  . Diabetes mellitus without complication ([place])   . Hypertension     Past Surgical History:  Procedure Laterality Date  . BREAST BIOPSY    . BREAST EXCISIONAL BIOPSY Left     History reviewed. No pertinent family  history.  Social History Reviewed with no changes to be made today.   Outpatient Medications Prior to Visit  Medication Sig Dispense Refill  . allopurinol (ZYLOPRIM) 100 MG tablet TAKE 1 TABLET(100 MG) BY MOUTH DAILY 90 tablet 0  . Blood Glucose Monitoring Suppl (ACCU-CHEK AVIVA PLUS) w/Device KIT Use as instructed 1 kit 0  . glucose blood (ACCU-CHEK ACTIVE STRIPS) test strip Use as instructed 100 each 12  . glucose blood test strip Use as instructed 100 each 12  . Lancets (ACCU-CHEK SOFT TOUCH) lancets Use as instructed Check blood sugar fasting and at bedtime and some days after lunch 100 each 12  . Lancets Thin MISC Use as instructed 100 each 3  . amLODipine (NORVASC) 10 MG tablet Take 1 tablet (10 mg total) by mouth daily. 90 tablet 1  . atorvastatin (LIPITOR) 40 MG tablet Take 1 tablet (40 mg total)  by mouth daily. 30 tablet 2  . cyclobenzaprine (FLEXERIL) 10 MG tablet Take 10 mg by mouth 2 (two) times daily as needed for muscle spasms.    . Insulin Pen Needle (B-D UF III MINI PEN NEEDLES) 31G X 5 MM MISC Use as directed 100 each 5  . LANTUS SOLOSTAR 100 UNIT/ML Solostar Pen ADMINISTER 30 UNITS UNDER THE SKIN TWICE DAILY 15 mL 2  . losartan-hydrochlorothiazide (HYZAAR) 100-25 MG tablet Take 1 tablet by mouth daily. 90 tablet 0  . metoprolol succinate (TOPROL-XL) 100 MG 24 hr tablet Take 100 mg by mouth daily. Take with or immediately following a meal.    . naproxen (NAPROSYN) 500 MG tablet Take 500 mg by mouth 2 (two) times daily with a meal.    . insulin aspart (NOVOLOG FLEXPEN) 100 UNIT/ML FlexPen Inject 5 units before lunch and dinner. (Patient not taking: Reported on 9/47/6546) 15 mL 0  . TRULICITY 1.5 TK/3.5WS SOPN INJECT 1.5MG INTO THE SKIN ONCE A WEEK (Patient not taking: Reported on 05/12/2017) 2 mL 0   No facility-administered medications prior to visit.     Allergies  Allergen Reactions  . Penicillins   . Septra [Sulfamethoxazole-Trimethoprim]        Objective:    BP  (!) 151/80 (BP Location: Left Arm, Patient Position: Sitting, Cuff Size: Normal)   Pulse 80   Temp 98 F (36.7 C) (Oral)   Ht '5\' 4"'  (1.626 m)   Wt 208 lb 12.8 oz (94.7 kg)   SpO2 99%   BMI 35.84 kg/m  Wt Readings from Last 3 Encounters:  05/12/17 208 lb 12.8 oz (94.7 kg)  02/09/17 214 lb 12.8 oz (97.4 kg)  01/02/17 212 lb 12.8 oz (96.5 kg)    Physical Exam  Constitutional: She is oriented to person, place, and time. She appears well-developed and well-nourished. She is cooperative.  HENT:  Head: Normocephalic and atraumatic.  Eyes: EOM are normal.  Neck: Normal range of motion.  Cardiovascular: Normal rate, regular rhythm and normal heart sounds. Exam reveals no gallop and no friction rub.  No murmur heard. Pulmonary/Chest: Effort normal and breath sounds normal. No tachypnea. No respiratory distress. She has no decreased breath sounds. She has no wheezes. She has no rhonchi. She has no rales. She exhibits no tenderness.  Abdominal: Soft. Bowel sounds are normal.  Musculoskeletal: Normal range of motion. She exhibits no edema.       Right shoulder: She exhibits normal range of motion, no tenderness and no pain.       Right upper arm: She exhibits no tenderness, no swelling, no edema and no deformity.  Neurological: She is alert and oriented to person, place, and time. Coordination normal.  Skin: Skin is warm and dry.  Psychiatric: She has a normal mood and affect. Her behavior is normal. Judgment and thought content normal.  Nursing note and vitals reviewed.      Patient has been counseled extensively about nutrition and exercise as well as the importance of adherence with medications and regular follow-up. The patient was given clear instructions to go to ER or return to medical center if symptoms don't improve, worsen or new problems develop. The patient verbalized understanding.   Follow-up: Return in about 3 weeks (around 06/02/2017) for 3 weeks for bp recheck  and 3 month f/u  for DM/HTN/HPL.   Gildardo Pounds, FNP-BC [facility] and [facility], [place]   05/12/2017, 1:07 PM

## 2017-05-13 ENCOUNTER — Other Ambulatory Visit: Payer: Self-pay | Admitting: Nurse Practitioner

## 2017-05-13 LAB — CMP14+EGFR
ALT: 30 IU/L (ref 0–32)
AST: 13 IU/L (ref 0–40)
Albumin/Globulin Ratio: 1.2 (ref 1.2–2.2)
Albumin: 3.9 g/dL (ref 3.6–4.8)
Alkaline Phosphatase: 223 IU/L — ABNORMAL HIGH (ref 39–117)
BUN/Creatinine Ratio: 21 (ref 12–28)
BUN: 41 mg/dL — ABNORMAL HIGH (ref 8–27)
Bilirubin Total: 0.5 mg/dL (ref 0.0–1.2)
CO2: 18 mmol/L — ABNORMAL LOW (ref 20–29)
Calcium: 9.7 mg/dL (ref 8.7–10.3)
Chloride: 103 mmol/L (ref 96–106)
Creatinine, Ser: 2 mg/dL — ABNORMAL HIGH (ref 0.57–1.00)
GFR calc Af Amer: 29 mL/min/{1.73_m2} — ABNORMAL LOW (ref >59)
GFR calc non Af Amer: 25 mL/min/{1.73_m2} — ABNORMAL LOW (ref >59)
Globulin, Total: 3.2 g/dL (ref 1.5–4.5)
Glucose: 264 mg/dL — ABNORMAL HIGH (ref 65–99)
Potassium: 4.8 mmol/L (ref 3.5–5.2)
Sodium: 139 mmol/L (ref 134–144)
Total Protein: 7.1 g/dL (ref 6.0–8.5)

## 2017-05-13 LAB — URIC ACID: Uric Acid: 6.6 mg/dL (ref 2.5–7.1)

## 2017-05-13 MED ORDER — LOSARTAN POTASSIUM 100 MG PO TABS: 100.0000 mg | ORAL_TABLET | Freq: Every day | ORAL | 3 refills | Status: DC

## 2017-05-14 ENCOUNTER — Telehealth: Payer: Self-pay

## 2017-05-14 ENCOUNTER — Telehealth: Payer: Self-pay | Admitting: Nurse Practitioner

## 2017-05-14 NOTE — Telephone Encounter (Signed)
CMA attempted to call patient to inform on lab results no answer. Left a VM for patient to call back.   CMA also called patient's son to inform him to have patient to call us back for lab report and have patient to stop taking Hyzaar and new Rx has been sent for her.   If patient call back, please inform:  Your kidney function is still abnormal. Please stop taking the Hyzaar (losartan-hctz immediately) I have sent in another medication for you to take at this time. We will still need to check your blood pressure on 06-02-2017. I will also need to recheck your lab work for your kidney function in 6-8 weeks at your appointment with me in July.  Were you seeing a specialist for your kidneys in [place]?

## 2017-05-14 NOTE — Telephone Encounter (Signed)
Patient called and requested for pcp nurse to return her call that she missed. Please fu at your earliest convenience.

## 2017-05-14 NOTE — Telephone Encounter (Signed)
-----   Message from Gildardo Pounds, NP sent at 05/13/2017 10:01 PM EDT ----- Your kidney function is still abnormal. Please stop taking the Hyzaar (losartan-hctz immediately) I have sent in another medication for you to take at this time. We will still need to check your blood pressure on 06-02-2017. I will also need to recheck your lab work for your kidney function in 6-8 weeks at your appointment with me in July.  Were you seeing a specialist for your kidneys in [place]?

## 2017-05-14 NOTE — Telephone Encounter (Signed)
error 

## 2017-05-15 NOTE — Telephone Encounter (Signed)
CMA spoke to patient to inform on lab results and PCP advised to stop taking Hyzaar and new Rx has been sent. Pt. Is concern about the new medicine and want to know if it also a water retention medicine. Pt. Stated it's been a long time since she see a Kidney specialist, about 5-10 years ago.

## 2017-05-20 NOTE — Telephone Encounter (Signed)
New medication is not a water retention medication. At this time I would like to hold any diuretic she is taking.

## 2017-05-22 NOTE — Telephone Encounter (Signed)
CMA called patient to inform PCP advising. Patient understood.

## 2017-06-02 ENCOUNTER — Ambulatory Visit: Payer: Medicare Other | Attending: Family Medicine | Admitting: *Deleted

## 2017-06-02 VITALS — BP 124/70 | HR 80

## 2017-06-02 DIAGNOSIS — I10 Essential (primary) hypertension: Secondary | ICD-10-CM | POA: Insufficient documentation

## 2017-06-02 NOTE — Progress Notes (Signed)
Mrs. Hayley Case arrived to [facility] alert and oriented and in good spirits. At last OV with PCP blood pressure was 151/80.   Pt denies chest pain, SOB, HA, dizziness, or blurred vision.  Verified medication. Pt states medication was taken this morning.  Initial blood pressure today was 138/80. Manual blood pressure reading after allowing Hayley Case a moment to relax, her blood pressure reading was 124/70.   Pt aware of next appointment with PCP in July.

## 2017-06-10 ENCOUNTER — Ambulatory Visit
Admission: RE | Admit: 2017-06-10 | Discharge: 2017-06-10 | Disposition: A | Discharge status: Home / Self Care | Payer: Medicare Other | Source: Ambulatory Visit | Attending: Nurse Practitioner | Admitting: Nurse Practitioner

## 2017-06-10 DIAGNOSIS — Z780 Asymptomatic menopausal state: Secondary | ICD-10-CM

## 2017-06-12 ENCOUNTER — Telehealth: Payer: Self-pay

## 2017-06-12 NOTE — Telephone Encounter (Signed)
CMA spoke to patient to inform on x-ray results.  Patient understood.

## 2017-06-12 NOTE — Telephone Encounter (Signed)
-----   Message from Gildardo Pounds, NP sent at 06/11/2017 11:25 PM EDT ----- Bone density test is normal and does not show any osteoporosis or osteopenia

## 2017-07-03 ENCOUNTER — Telehealth: Payer: Self-pay | Admitting: Nurse Practitioner

## 2017-07-03 NOTE — Telephone Encounter (Signed)
Pt states she has an occasional  ringing in right ear and can hear her heart  throbbing in her right ear. Denies dizziness, headache or drainage. She did say she had a flight to [place] last week. Her next appointment is July 31. Advised to call for walkin appointment. Please advise.

## 2017-07-03 NOTE — Telephone Encounter (Signed)
Called patient and scheduled an appt. For 6/26 with Freeman Caldron.

## 2017-07-03 NOTE — Telephone Encounter (Signed)
Patient called stating that she has a ringing in her ears ann would like to speak with the nurse for advise. Please f/u.

## 2017-07-07 NOTE — Telephone Encounter (Signed)
Agree/Noted. 

## 2017-07-08 ENCOUNTER — Ambulatory Visit: Payer: Medicare Other | Attending: Nurse Practitioner | Admitting: Physician Assistant

## 2017-07-08 VITALS — BP 131/77 | HR 88 | Temp 98.0°F | Resp 18 | Ht 64.0 in | Wt 214.0 lb

## 2017-07-08 DIAGNOSIS — H1013 Acute atopic conjunctivitis, bilateral: Secondary | ICD-10-CM | POA: Insufficient documentation

## 2017-07-08 DIAGNOSIS — Z79899 Other long term (current) drug therapy: Secondary | ICD-10-CM | POA: Insufficient documentation

## 2017-07-08 DIAGNOSIS — E1165 Type 2 diabetes mellitus with hyperglycemia: Secondary | ICD-10-CM | POA: Diagnosis not present

## 2017-07-08 DIAGNOSIS — H9311 Tinnitus, right ear: Secondary | ICD-10-CM | POA: Diagnosis not present

## 2017-07-08 DIAGNOSIS — I10 Essential (primary) hypertension: Secondary | ICD-10-CM | POA: Insufficient documentation

## 2017-07-08 DIAGNOSIS — Z794 Long term (current) use of insulin: Secondary | ICD-10-CM | POA: Diagnosis not present

## 2017-07-08 DIAGNOSIS — E785 Hyperlipidemia, unspecified: Secondary | ICD-10-CM | POA: Insufficient documentation

## 2017-07-08 LAB — GLUCOSE, POCT (MANUAL RESULT ENTRY): POC Glucose: 106 mg/dl — AB (ref 70–99)

## 2017-07-08 MED ORDER — FLUTICASONE PROPIONATE 50 MCG/ACT NA SUSP: 2.0000 | Freq: Every day | NASAL | 6 refills | Status: DC

## 2017-07-08 MED ORDER — OLOPATADINE HCL 0.1 % OP SOLN: 1.0000 [drp] | Freq: Two times a day (BID) | OPHTHALMIC | 12 refills | Status: DC

## 2017-07-08 NOTE — Progress Notes (Deleted)
Patient ID: Hayley Case, female   DOB: 04/05/1950, 67 y.o.   MRN: [id number]

## 2017-07-08 NOTE — Patient Instructions (Signed)
Take claritin D for 2-3 weeks   Tinnitus Tinnitus refers to hearing a sound when there is no actual source for that sound. This is often described as ringing in the ears. However, people with this condition may hear a variety of noises. A person may hear the sound in one ear or in both ears. The sounds of tinnitus can be soft, loud, or somewhere in between. Tinnitus can last for a few seconds or can be constant for days. It may go away without treatment and come back at various times. When tinnitus is constant or happens often, it can lead to other problems, such as trouble sleeping and trouble concentrating. Almost everyone experiences tinnitus at some point. Tinnitus that is long-lasting (chronic) or comes back often is a problem that may require medical attention. What are the causes? The cause of tinnitus is often not known. In some cases, it can result from other problems or conditions, including:  Exposure to loud noises from machinery, music, or other sources.  Hearing loss.  Ear or sinus infections.  Earwax buildup.  A foreign object in the ear.  Use of certain medicines.  Use of alcohol and caffeine.  High blood pressure.  Heart diseases.  Anemia.  Allergies.  Meniere disease.  Thyroid problems.  Tumors.  An enlarged part of a weakened blood vessel (aneurysm).  What are the signs or symptoms? The main symptom of tinnitus is hearing a sound when there is no source for that sound. It may sound like:  Buzzing.  Roaring.  Ringing.  Blowing air, similar to the sound heard when you listen to a seashell.  Hissing.  Whistling.  Sizzling.  Humming.  Running water.  A sustained musical note.  How is this diagnosed? Tinnitus is diagnosed based on your symptoms. Your health care provider will do a physical exam. A comprehensive hearing exam (audiologic exam) will be done if your tinnitus:  Affects only one ear (unilateral).  Causes hearing  difficulties.  Lasts 6 months or longer.  You may also need to see a health care provider who specializes in hearing disorders (audiologist). You may be asked to complete a questionnaire to determine the severity of your tinnitus. Tests may be done to help determine the cause and to rule out other conditions. These can include:  Imaging studies of your head and brain, such as: ? A CT scan. ? An MRI.  An imaging study of your blood vessels (angiogram).  How is this treated? Treating an underlying medical condition can sometimes make tinnitus go away. If your tinnitus continues, other treatments may include:  Medicines, such as certain antidepressants or sleeping aids.  Sound generators to mask the tinnitus. These include: ? Tabletop sound machines that play relaxing sounds to help you fall asleep. ? Wearable devices that fit in your ear and play sounds or music. ? A small device that uses headphones to deliver a signal embedded in music (acoustic neural stimulation). In time, this may change the pathways of your brain and make you less sensitive to tinnitus. This device is used for very severe cases when no other treatment is working.  Therapy and counseling to help you manage the stress of living with tinnitus.  Using hearing aids or cochlear implants, if your tinnitus is related to hearing loss.  Follow these instructions at home:  When possible, avoid being in loud places and being exposed to loud sounds.  Wear hearing protection, such as earplugs, when you are exposed to loud noises.  Do not take stimulants, such as nicotine, alcohol, or caffeine.  Practice techniques for reducing stress, such as meditation, yoga, or deep breathing.  Use a white noise machine, a humidifier, or other devices to mask the sound of tinnitus.  Sleep with your head slightly raised. This may reduce the impact of tinnitus.  Try to get plenty of rest each night. Contact a health care provider  if:  You have tinnitus in just one ear.  Your tinnitus continues for 3 weeks or longer without stopping.  Home care measures are not helping.  You have tinnitus after a head injury.  You have tinnitus along with any of the following: ? Dizziness. ? Loss of balance. ? Nausea and vomiting. This information is not intended to replace advice given to you by your health care provider. Make sure you discuss any questions you have with your health care provider. Document Released: 12/30/2004 Document Revised: 09/02/2015 Document Reviewed: 06/01/2013 Elsevier Interactive Patient Education  2018 [employer].

## 2017-07-08 NOTE — Progress Notes (Signed)
Patient ID: Hayley Case, female   DOB: 27-Mar-1950, 67 y.o.   MRN: [id number]      Hayley Case, is a 67 y.o. female  BEM:[id number]  OFH:[id number]  DOB - [birth year]  Subjective:  Chief Complaint and HPI: Hayley Case is a 67 y.o. female here today Ringing in ears/hearing heartbeat in R ear over the last 2 weeks.  2-3 episodes.  Each episode lasts a few hours.  Started about 2 weeks after flying to [place].  Sometimes awakens with crusty eyes for the past week or 2 and eyes feel irritated but not painful.  No erythema.   ROS:   Constitutional:  No f/c, No night sweats, No unexplained weight loss. EENT:  No vision changes, No blurry vision, No hearing changes. No additional mouth, throat, or ear problems.  Respiratory: No cough, No SOB Cardiac: No CP, no palpitations GI:  No abd pain, No N/V/D. GU: No Urinary s/sx Musculoskeletal: No joint pain Neuro: No headache, no dizziness, no motor weakness.  Skin: No rash Endocrine:  No polydipsia. No polyuria.  Psych: Denies SI/HI  No problems updated.  ALLERGIES: Allergies  Allergen Reactions  . Penicillins   . Septra [Sulfamethoxazole-Trimethoprim]     PAST MEDICAL HISTORY: Past Medical History:  Diagnosis Date  . Diabetes mellitus without complication ([employer])   . Hyperlipidemia   . Hypertension     MEDICATIONS AT HOME: Prior to Admission medications   Medication Sig Start Date End Date Taking? Authorizing Provider  allopurinol (ZYLOPRIM) 100 MG tablet TAKE 1 TABLET(100 MG) BY MOUTH DAILY 04/20/17  Yes Gildardo Pounds, NP  amLODipine (NORVASC) 10 MG tablet Take 1 tablet (10 mg total) by mouth daily. 05/12/17  Yes Gildardo Pounds, NP  atorvastatin (LIPITOR) 40 MG tablet Take 1 tablet (40 mg total) by mouth daily. 05/12/17  Yes Gildardo Pounds, NP  Blood Glucose Monitoring Suppl (ACCU-CHEK AVIVA PLUS) w/Device KIT Use as instructed 02/10/17  Yes Gildardo Pounds, NP  cyclobenzaprine (FLEXERIL) 10 MG tablet Take 1 tablet (10 mg  total) by mouth 2 (two) times daily as needed for muscle spasms. 05/12/17 08/10/17 Yes Gildardo Pounds, NP  Dulaglutide (TRULICITY) 1.5 PQ/9.8YM SOPN INJECT 1.5MG INTO THE SKIN ONCE A WEEK 05/12/17  Yes Gildardo Pounds, NP  glucose blood (ACCU-CHEK ACTIVE STRIPS) test strip Use as instructed 02/10/17  Yes Gildardo Pounds, NP  glucose blood test strip Use as instructed 02/17/17  Yes Gildardo Pounds, NP  Insulin Pen Needle (B-D UF III MINI PEN NEEDLES) 31G X 5 MM MISC Use as directed 05/12/17  Yes Gildardo Pounds, NP  Lancets (ACCU-CHEK SOFT TOUCH) lancets Use as instructed Check blood sugar fasting and at bedtime and some days after lunch 02/13/17  Yes Gildardo Pounds, NP  Lancets Thin MISC Use as instructed 02/17/17  Yes Gildardo Pounds, NP  losartan (COZAAR) 100 MG tablet Take 1 tablet (100 mg total) by mouth daily. 05/13/17  Yes Gildardo Pounds, NP  metoprolol succinate (TOPROL-XL) 100 MG 24 hr tablet Take 1 tablet (100 mg total) by mouth daily. Take with or immediately following a meal. 05/12/17  Yes Gildardo Pounds, NP  fluticasone (FLONASE) 50 MCG/ACT nasal spray Place 2 sprays into both nostrils daily. 07/08/17   Argentina Donovan, PA-C  Insulin Glargine (LANTUS SOLOSTAR) 100 UNIT/ML Solostar Pen Inject 40 Units into the skin 2 (two) times daily. 05/12/17 06/11/17  Gildardo Pounds, NP  olopatadine (PATANOL) 0.1 % ophthalmic solution Place  1 drop into both eyes 2 (two) times daily. 07/08/17   Argentina Donovan, PA-C     Objective:  EXAM:   Vitals:   07/08/17 1049  BP: 131/77  Pulse: 88  Resp: 18  Temp: 98 F (36.7 C)  TempSrc: Oral  SpO2: 98%  Weight: 214 lb (97.1 kg)  Height: '5\' 4"'  (1.626 m)    General appearance : A&OX3. NAD. Non-toxic-appearing HEENT: Atraumatic and Normocephalic.  PERRLA. EOM intact. No erythema of B conjunctivae, there is mild chemosis B.   TM on L partially obstructed by cerumen but WNL.  RTM with slightly distorted light reflex and bulging without erythema or  perforation.  Mouth-MMM, post pharynx WNL w/o erythema, No PND. Neck: supple, no JVD. No cervical lymphadenopathy. No thyromegaly Chest/Lungs:  Breathing-non-labored, Good air entry bilaterally, breath sounds normal without rales, rhonchi, or wheezing  CVS: S1 S2 regular, no murmurs, gallops, rubs  Extremities: Bilateral Lower Ext shows no edema, both legs are warm to touch with = pulse throughout Neurology:  CN II-XII grossly intact, Non focal.   Psych:  TP linear. J/I WNL. Normal speech. Appropriate eye contact and affect.  Skin:  No Rash  Data Review Lab Results  Component Value Date   HGBA1C 10.1 05/12/2017   HGBA1C 7.6 02/09/2017   HGBA1C 11.0 10/15/2016     Assessment & Plan   1. 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  Dispense: 16 g; Refill: 6  2. Type 2 diabetes mellitus with hyperglycemia, without long-term current use of insulin (HCC) Blood sugar is good today - Glucose (CBG)  3. Allergic conjunctivitis of both eyes - olopatadine (PATANOL) 0.1 % ophthalmic solution; Place 1 drop into both eyes 2 (two) times daily.  Dispense: 5 mL; Refill: 12     Patient have been counseled extensively about nutrition and exercise  Return for keep 7/31 appt with Zelda.  The patient was given clear instructions to go to ER or return to medical center if symptoms don't improve, worsen or new problems develop. The patient verbalized understanding. The patient was told to call to get lab results if they haven't heard anything in the next week.     Freeman Caldron, PA-C [facility] and [facility] [place], [place]   07/08/2017, 11:00 AM

## 2017-07-14 ENCOUNTER — Other Ambulatory Visit: Payer: Self-pay

## 2017-07-14 DIAGNOSIS — E118 Type 2 diabetes mellitus with unspecified complications: Secondary | ICD-10-CM

## 2017-07-14 DIAGNOSIS — Z794 Long term (current) use of insulin: Secondary | ICD-10-CM

## 2017-07-14 MED ORDER — INSULIN GLARGINE 100 UNIT/ML SOLOSTAR PEN: 40.0000 [IU] | PEN_INJECTOR | Freq: Two times a day (BID) | SUBCUTANEOUS | 6 refills | Status: DC

## 2017-08-12 ENCOUNTER — Encounter: Payer: Self-pay | Admitting: Nurse Practitioner

## 2017-08-12 ENCOUNTER — Ambulatory Visit: Payer: Medicare Other | Attending: Nurse Practitioner | Admitting: Nurse Practitioner

## 2017-08-12 VITALS — BP 144/84 | HR 68 | Temp 98.4°F | Ht 64.0 in | Wt 212.2 lb

## 2017-08-12 DIAGNOSIS — E1169 Type 2 diabetes mellitus with other specified complication: Secondary | ICD-10-CM | POA: Diagnosis not present

## 2017-08-12 DIAGNOSIS — E669 Obesity, unspecified: Secondary | ICD-10-CM | POA: Diagnosis not present

## 2017-08-12 DIAGNOSIS — Z6836 Body mass index (BMI) 36.0-36.9, adult: Secondary | ICD-10-CM | POA: Diagnosis not present

## 2017-08-12 DIAGNOSIS — E785 Hyperlipidemia, unspecified: Secondary | ICD-10-CM | POA: Insufficient documentation

## 2017-08-12 DIAGNOSIS — Z79899 Other long term (current) drug therapy: Secondary | ICD-10-CM | POA: Diagnosis not present

## 2017-08-12 DIAGNOSIS — Z794 Long term (current) use of insulin: Secondary | ICD-10-CM | POA: Insufficient documentation

## 2017-08-12 DIAGNOSIS — E1165 Type 2 diabetes mellitus with hyperglycemia: Secondary | ICD-10-CM | POA: Diagnosis not present

## 2017-08-12 DIAGNOSIS — Z7951 Long term (current) use of inhaled steroids: Secondary | ICD-10-CM | POA: Diagnosis not present

## 2017-08-12 DIAGNOSIS — I10 Essential (primary) hypertension: Secondary | ICD-10-CM | POA: Insufficient documentation

## 2017-08-12 LAB — GLUCOSE, POCT (MANUAL RESULT ENTRY): POC Glucose: 162 mg/dl — AB (ref 70–99)

## 2017-08-12 LAB — POCT GLYCOSYLATED HEMOGLOBIN (HGB A1C): Hemoglobin A1C: 8.1 % — AB (ref 4.0–5.6)

## 2017-08-12 NOTE — Progress Notes (Signed)
Assessment & Plan:  Hayley Case was seen today for follow-up and eye problem.  Diagnoses and all orders for this visit:  Type 2 diabetes mellitus with hyperglycemia, without long-term current use of insulin (HCC) -     Glucose (CBG) -     HgB A1c  Essential hypertension Continue all antihypertensives as prescribed.  Remember to bring in your blood pressure log with you for your follow up appointment.  DASH/Mediterranean Diets are healthier choices for HTN.    Hyperlipidemia associated with type 2 diabetes mellitus ([place]) INSTRUCTIONS: Work on a low fat, heart healthy diet and participate in regular aerobic exercise program by working out at least 150 minutes per week; 5 days a week-30 minutes per day. Avoid red meat, fried foods. junk foods, sodas, sugary drinks, unhealthy snacking, alcohol and smoking.  Drink at least 48oz of water per day and monitor your carbohydrate intake daily.     Patient has been counseled on age-appropriate routine health concerns for screening and prevention. These are reviewed and up-to-date. Referrals have been placed accordingly. Immunizations are up-to-date or declined.    Subjective:   Chief Complaint  Patient presents with  . Follow-up    Patient is here to follow-up on diabetes, hypertension.  . Eye Problem    Pt. stated when Hayley Case wakes up her eyes are crusty and sting when tears up.    HPI Hayley Case 67 y.o. female presents to office today for follow up to DM/HTN/HPL.  Hypertension Hayley Case is not exercising and is adherent to low salt diet.  Hayley Case does not have a blood pressure log  today.  Blood pressure is well controlled at home. Taking amlodipine 10 mg, losartan 160m daily Cardiac symptoms none. Patient denies chest pain, chest pressure/discomfort, dyspnea, exertional chest pressure/discomfort, lower extremity edema, palpitations and paroxysmal nocturnal dyspnea.  Cardiovascular risk factors: advanced age (older than 535for men, 654for women),  diabetes mellitus, dyslipidemia, hypertension and obesity (BMI >= 30 kg/m2). Use of agents associated with hypertension: none.  History of target organ damage: none. BP Readings from Last 3 Encounters:  08/12/17 (!) 144/84  07/08/17 131/77  06/02/17 124/70    Hyperlipidemia Patient presents for follow up to hyperlipidemia.  Hayley Case is medication compliant taking lipitor 459m. Hayley Case is not diet compliant and denies skin xanthelasma or statin intolerance including myalgias.  Lab Results  Component Value Date   CHOL 201 (H) 02/09/2017   Lab Results  Component Value Date   HDL 52 02/09/2017   Lab Results  Component Value Date   LDLCALC 124 (H) 02/09/2017   Lab Results  Component Value Date   TRIG 125 02/09/2017   Lab Results  Component Value Date   CHOLHDL 3.9 02/09/2017    Diabetes Mellitus Type II Chronic and improving. Current symptoms/problems include none and have been stable.  Hayley Case has her glucometer today.   Review of readings is as follows.: 7 day 249 14 Day 225 30 Day 172 90 Day 142 Known diabetic complications: cardiovascular disease Current diabetic medications include: Lantus [id number], trulicity 1.6.7MCeekly  Eye exam current (within one year): yes Weight trend: stable Prior visit with dietician: no Current monitoring regimen: at least 2x per day Any episodes of hypoglycemia? no Is Hayley Case on ACE inhibitor or angiotensin II receptor blocker?  Yes  Lab Results  Component Value Date   HGBA1C 8.1 (A) 08/12/2017   HGBA1C 10.1 05/12/2017   HGBA1C 7.6 02/09/2017     Review of Systems  Constitutional: Negative for fever, malaise/fatigue and weight loss.  HENT: Negative.  Negative for nosebleeds.   Eyes: Positive for discharge and redness. Negative for blurred vision, double vision and photophobia.  Respiratory: Negative.  Negative for cough and shortness of breath.   Cardiovascular: Negative.  Negative for chest pain, palpitations and leg swelling.    Gastrointestinal: Negative.  Negative for heartburn, nausea and vomiting.  Musculoskeletal: Negative.  Negative for myalgias.  Neurological: Negative.  Negative for dizziness, focal weakness, seizures and headaches.  Endo/Heme/Allergies: Positive for environmental allergies.  Psychiatric/Behavioral: Negative.  Negative for suicidal ideas.    Past Medical History:  Diagnosis Date  . Diabetes mellitus without complication ([place])   . Hyperlipidemia   . Hypertension     Past Surgical History:  Procedure Laterality Date  . BREAST BIOPSY    . BREAST EXCISIONAL BIOPSY Left     History reviewed. No pertinent family history.  Social History Reviewed with no changes to be made today.   Outpatient Medications Prior to Visit  Medication Sig Dispense Refill  . allopurinol (ZYLOPRIM) 100 MG tablet TAKE 1 TABLET(100 MG) BY MOUTH DAILY 90 tablet 0  . amLODipine (NORVASC) 10 MG tablet Take 1 tablet (10 mg total) by mouth daily. 90 tablet 1  . atorvastatin (LIPITOR) 40 MG tablet Take 1 tablet (40 mg total) by mouth daily. 90 tablet 1  . Blood Glucose Monitoring Suppl (ACCU-CHEK AVIVA PLUS) w/Device KIT Use as instructed 1 kit 0  . Carboxymethylcellulose Sodium (REFRESH TEARS OP) Apply to eye.    . Dulaglutide (TRULICITY) 1.5 YY/4.8GN SOPN INJECT 1.5MG INTO THE SKIN ONCE A WEEK 2 mL 6  . fluticasone (FLONASE) 50 MCG/ACT nasal spray Place 2 sprays into both nostrils daily. 16 g 6  . glucose blood (ACCU-CHEK ACTIVE STRIPS) test strip Use as instructed 100 each 12  . glucose blood test strip Use as instructed 100 each 12  . Insulin Glargine (LANTUS SOLOSTAR) 100 UNIT/ML Solostar Pen Inject 40 Units into the skin 2 (two) times daily. 15 mL 6  . Insulin Pen Needle (B-D UF III MINI PEN NEEDLES) 31G X 5 MM MISC Use as directed 100 each 5  . Ketotifen Fumarate (EYE ITCH RELIEF OP) Apply to eye.    . Lancets (ACCU-CHEK SOFT TOUCH) lancets Use as instructed Check blood sugar fasting and at bedtime and  some days after lunch 100 each 12  . Lancets Thin MISC Use as instructed 100 each 3  . losartan (COZAAR) 100 MG tablet Take 1 tablet (100 mg total) by mouth daily. 90 tablet 3  . metoprolol succinate (TOPROL-XL) 100 MG 24 hr tablet Take 1 tablet (100 mg total) by mouth daily. Take with or immediately following a meal. 90 tablet 1  . olopatadine (PATANOL) 0.1 % ophthalmic solution Place 1 drop into both eyes 2 (two) times daily. 5 mL 12   No facility-administered medications prior to visit.     Allergies  Allergen Reactions  . Penicillins   . Septra [Sulfamethoxazole-Trimethoprim]        Objective:    BP (!) 144/84 (BP Location: Left Arm, Patient Position: Sitting, Cuff Size: Large)   Pulse 68   Temp 98.4 F (36.9 C) (Oral)   Ht 5' 4" (1.626 m)   Wt 212 lb 3.2 oz (96.3 kg)   SpO2 98%   BMI 36.42 kg/m  Wt Readings from Last 3 Encounters:  08/12/17 212 lb 3.2 oz (96.3 kg)  07/08/17 214 lb (97.1 kg)  05/12/17 208  lb 12.8 oz (94.7 kg)    Physical Exam  Constitutional: Hayley Case is oriented to person, place, and time. Hayley Case appears well-developed and well-nourished. Hayley Case is cooperative.  HENT:  Head: Normocephalic and atraumatic.  Eyes: EOM are normal.  Neck: Normal range of motion.  Cardiovascular: Normal rate, regular rhythm and normal heart sounds. Exam reveals no gallop and no friction rub.  No murmur heard. Pulmonary/Chest: Effort normal and breath sounds normal. No tachypnea. No respiratory distress. Hayley Case has no decreased breath sounds. Hayley Case has no wheezes. Hayley Case has no rhonchi. Hayley Case has no rales. Hayley Case exhibits no tenderness.  Abdominal: Bowel sounds are normal.  Musculoskeletal: Normal range of motion. Hayley Case exhibits no edema.  Neurological: Hayley Case is alert and oriented to person, place, and time. Coordination normal.  Skin: Skin is warm and dry.  Psychiatric: Hayley Case has a normal mood and affect. Her behavior is normal. Judgment and thought content normal.  Nursing note and vitals  reviewed.        Patient has been counseled extensively about nutrition and exercise as well as the importance of adherence with medications and regular follow-up. The patient was given clear instructions to go to ER or return to medical center if symptoms don't improve, worsen or new problems develop. The patient verbalized understanding.   Follow-up: Return in about 3 months (around 11/12/2017).   Gildardo Pounds, FNP-BC [facility] and [facility] [place], [place]   08/12/2017, 5:28 PM

## 2017-08-12 NOTE — Patient Instructions (Signed)
Diabetes blood sugar goals  Fasting in AM before breakfast which means at least 8 hrs of no eating or drinking) except water or unsweetened coffee or tea): 90-130 2 hrs after meals: < 180,   Hypoglycemia or low blood sugar: < 70 (You should not have hypoglycemia.)  Aim for 30 minutes of exercise most days. Rethink what you drink. Water is great! Aim for 2-3 Carb Choices per meal (30-45 grams) +/- 1 either way  Aim for 0-15 Carbs per snack if hungry  Include protein in moderation with your meals and snacks  Consider reading food labels for Total Carbohydrate and Fat Grams of foods  Consider checking BG at alternate times per day  Continue taking medication as directed Be mindful about how much sugar you are adding to beverages and other foods. Fruit Punch - find one with no sugar  Measure and decrease portions of carbohydrate foods  Make your plate and don't go back for seconds

## 2017-09-15 ENCOUNTER — Other Ambulatory Visit: Payer: Self-pay | Admitting: Nurse Practitioner

## 2017-09-15 DIAGNOSIS — E118 Type 2 diabetes mellitus with unspecified complications: Secondary | ICD-10-CM

## 2017-09-15 DIAGNOSIS — Z794 Long term (current) use of insulin: Secondary | ICD-10-CM

## 2017-09-15 MED ORDER — INSULIN GLARGINE 100 UNIT/ML SOLOSTAR PEN: 40.0000 [IU] | PEN_INJECTOR | Freq: Two times a day (BID) | SUBCUTANEOUS | 6 refills | Status: DC

## 2017-09-15 MED ORDER — INSULIN PEN NEEDLE 31G X 5 MM MISC: 5 refills | Status: DC

## 2017-10-05 ENCOUNTER — Other Ambulatory Visit: Payer: Self-pay | Admitting: Nurse Practitioner

## 2017-11-13 ENCOUNTER — Encounter: Payer: Self-pay | Admitting: Nurse Practitioner

## 2017-11-13 ENCOUNTER — Ambulatory Visit: Payer: Medicare Other | Attending: Nurse Practitioner | Admitting: Nurse Practitioner

## 2017-11-13 VITALS — BP 133/78 | HR 81 | Temp 97.8°F | Ht 64.0 in | Wt 212.6 lb

## 2017-11-13 DIAGNOSIS — Z23 Encounter for immunization: Secondary | ICD-10-CM | POA: Diagnosis not present

## 2017-11-13 DIAGNOSIS — E119 Type 2 diabetes mellitus without complications: Secondary | ICD-10-CM | POA: Insufficient documentation

## 2017-11-13 DIAGNOSIS — I10 Essential (primary) hypertension: Secondary | ICD-10-CM | POA: Diagnosis not present

## 2017-11-13 DIAGNOSIS — Z882 Allergy status to sulfonamides status: Secondary | ICD-10-CM | POA: Insufficient documentation

## 2017-11-13 DIAGNOSIS — N393 Stress incontinence (female) (male): Secondary | ICD-10-CM

## 2017-11-13 DIAGNOSIS — E118 Type 2 diabetes mellitus with unspecified complications: Secondary | ICD-10-CM

## 2017-11-13 DIAGNOSIS — Z79899 Other long term (current) drug therapy: Secondary | ICD-10-CM | POA: Insufficient documentation

## 2017-11-13 DIAGNOSIS — E559 Vitamin D deficiency, unspecified: Secondary | ICD-10-CM

## 2017-11-13 DIAGNOSIS — E785 Hyperlipidemia, unspecified: Secondary | ICD-10-CM | POA: Insufficient documentation

## 2017-11-13 DIAGNOSIS — Z794 Long term (current) use of insulin: Secondary | ICD-10-CM | POA: Insufficient documentation

## 2017-11-13 DIAGNOSIS — Z880 Allergy status to penicillin: Secondary | ICD-10-CM | POA: Insufficient documentation

## 2017-11-13 LAB — POCT GLYCOSYLATED HEMOGLOBIN (HGB A1C): Hemoglobin A1C: 8.8 % — AB (ref 4.0–5.6)

## 2017-11-13 LAB — GLUCOSE, POCT (MANUAL RESULT ENTRY): POC Glucose: 97 mg/dl (ref 70–99)

## 2017-11-13 MED ORDER — INSULIN PEN NEEDLE 31G X 5 MM MISC: 5 refills | Status: DC

## 2017-11-13 MED ORDER — INSULIN GLARGINE 100 UNIT/ML SOLOSTAR PEN: 40.0000 [IU] | PEN_INJECTOR | Freq: Two times a day (BID) | SUBCUTANEOUS | 6 refills | Status: DC

## 2017-11-13 MED ORDER — DULAGLUTIDE 1.5 MG/0.5ML ~~LOC~~ SOAJ: SUBCUTANEOUS | 6 refills | Status: DC

## 2017-11-13 NOTE — Progress Notes (Signed)
Assessment & Plan:  Dicy was seen today for follow-up and referral.  Diagnoses and all orders for this visit:  Type 2 diabetes mellitus with complication, with long-term current use of insulin (HCC) -     Glucose (CBG) -     HgB A1c -     Insulin Glargine (LANTUS SOLOSTAR) 100 UNIT/ML Solostar Pen; Inject 40 Units into the skin 2 (two) times daily. -     Dulaglutide (TRULICITY) 1.5 KF/2.7MD SOPN; INJECT 1.5MG INTO THE SKIN ONCE A WEEK -     Insulin Pen Needle (B-D UF III MINI PEN NEEDLES) 31G X 5 MM MISC; Use as directed. Check blood glucose levels by fingerstick twice a day. -     Ambulatory referral to Ophthalmology Continue blood sugar control as discussed in office today, low carbohydrate diet, and regular physical exercise as tolerated, 150 minutes per week (30 min each day, 5 days per week, or 50 min 3 days per week). Keep blood sugar logs with fasting goal of 90-130 mg/dl, post prandial (after you eat) less than 180.  For Hypoglycemia: BS <60 and Hyperglycemia BS >400; contact the clinic ASAP. Annual eye exams and foot exams are recommended.  Essential hypertension -     CMP14+EGFR -     CBC Continue all antihypertensives as prescribed.  Remember to bring in your blood pressure log with you for your follow up appointment.  DASH/Mediterranean Diets are healthier choices for HTN.    Need for immunization against influenza -     Flu Vaccine QUAD 36+ mos IM  Stress incontinence in female -     Ambulatory referral to Urology  Vitamin D deficiency -     VITAMIN D 25 Hydroxy (Vit-D Deficiency, Fractures)    Patient has been counseled on age-appropriate routine health concerns for screening and prevention. These are reviewed and up-to-date. Referrals have been placed accordingly. Immunizations are up-to-date or declined.    Subjective:   Chief Complaint  Patient presents with  . Follow-up    Pt. is here to follow-up on diabetes.   . Referral    Pt. would like a referral  to a an eye doctor.    HPI Hayley Case 67 y.o. female presents to office today for follow up for follow up to DM and HTN. She endorses increased stress focusing on her son't  CHRONIC HYPERTENSION Disease Monitoring  Blood pressure range Well controlled.  BP Readings from Last 3 Encounters:  11/13/17 133/78  08/12/17 (!) 144/84  07/08/17 131/77   Chest pain: no   Dyspnea: no   Claudication: no  Medication compliance: yes, amlodipine 44m, metoprolol 1010mdaily and losartan 100 mg daily.  Medication Side Effects  Lightheadedness: no   Urinary frequency: no   Edema: no   Impotence: no  Preventitive Healthcare:  Exercise: no   Diet Pattern: diet: general  Salt Restriction:  no    Type 2 Diabetes Mellitus Disease course has been worsening. She endorses increased stressors and dietary noncompliance recently. She has not been monitoring her blood glucose levels as instructed.  There are no hypoglycemic symptoms. There are no hypoglycemic complications. Symptoms are stable.  Current diabetic treatment includes Lantus 40 units BID, trulicity 1.5 mg weekly however she has not been administering her medications as prescribed due to family stressors. Weight is  stable.  Patient is not compliant with exercise.  An ACE inhibitor/angiotensin II receptor blocker is being taken. Patient does not see a podiatrist. Eye exam is not  current.  Lab Results  Component Value Date   HGBA1C 8.8 (A) 11/13/2017    Urinary Incontinence: Patient complains of urinary incontinence. This has been present for several months. She leaks urine with coughing, laughing, sneezing, with urge. Patient describes the symptoms as  urge to urinate with little or no warning, urine leakage with coughing/heavy physical activity and urine leaking unpredictably.  Factors associated with symptoms include associated with menopause. Evaluation to date includes she has been evaluated by a urologist in the past. Treatment to date  includes none.    Review of Systems  Constitutional: Negative for fever, malaise/fatigue and weight loss.  HENT: Negative.  Negative for nosebleeds.   Eyes: Negative.  Negative for blurred vision, double vision and photophobia.  Respiratory: Negative.  Negative for cough and shortness of breath.   Cardiovascular: Negative.  Negative for chest pain, palpitations and leg swelling.  Gastrointestinal: Negative.  Negative for heartburn, nausea and vomiting.  Genitourinary:       SEE HPI  Musculoskeletal: Negative.  Negative for myalgias.  Neurological: Negative.  Negative for dizziness, focal weakness, seizures and headaches.  Psychiatric/Behavioral: Negative.  Negative for suicidal ideas.    Past Medical History:  Diagnosis Date  . Diabetes mellitus without complication ([place])   . Hyperlipidemia   . Hypertension     Past Surgical History:  Procedure Laterality Date  . BREAST BIOPSY    . BREAST EXCISIONAL BIOPSY Left     History reviewed. No pertinent family history.  Social History Reviewed with no changes to be made today.   Outpatient Medications Prior to Visit  Medication Sig Dispense Refill  . allopurinol (ZYLOPRIM) 100 MG tablet TAKE 1 TABLET BY MOUTH DAILY 90 tablet 0  . amLODipine (NORVASC) 10 MG tablet Take 1 tablet (10 mg total) by mouth daily. 90 tablet 1  . atorvastatin (LIPITOR) 40 MG tablet Take 1 tablet (40 mg total) by mouth daily. 90 tablet 1  . Blood Glucose Monitoring Suppl (ACCU-CHEK AVIVA PLUS) w/Device KIT Use as instructed 1 kit 0  . glucose blood (ACCU-CHEK ACTIVE STRIPS) test strip Use as instructed 100 each 12  . Lancets (ACCU-CHEK SOFT TOUCH) lancets Use as instructed Check blood sugar fasting and at bedtime and some days after lunch 100 each 12  . losartan (COZAAR) 100 MG tablet Take 1 tablet (100 mg total) by mouth daily. 90 tablet 3  . metoprolol succinate (TOPROL-XL) 100 MG 24 hr tablet Take 1 tablet (100 mg total) by mouth daily. Take with or  immediately following a meal. 90 tablet 1  . olopatadine (PATANOL) 0.1 % ophthalmic solution Place 1 drop into both eyes 2 (two) times daily. 5 mL 12  . Dulaglutide (TRULICITY) 1.5 VE/9.3YB SOPN INJECT 1.5MG INTO THE SKIN ONCE A WEEK 2 mL 6  . Insulin Pen Needle (B-D UF III MINI PEN NEEDLES) 31G X 5 MM MISC Use as directed. Check blood glucose levels by fingerstick twice a day. 100 each 5  . Carboxymethylcellulose Sodium (REFRESH TEARS OP) Apply to eye.    . fluticasone (FLONASE) 50 MCG/ACT nasal spray Place 2 sprays into both nostrils daily. (Patient not taking: Reported on 11/13/2017) 16 g 6  . glucose blood test strip Use as instructed (Patient not taking: Reported on 11/13/2017) 100 each 12  . Ketotifen Fumarate (EYE ITCH RELIEF OP) Apply to eye.    . Lancets Thin MISC Use as instructed (Patient not taking: Reported on 11/13/2017) 100 each 3  . Insulin Glargine (LANTUS SOLOSTAR) 100  UNIT/ML Solostar Pen Inject 40 Units into the skin 2 (two) times daily. 15 mL 6   No facility-administered medications prior to visit.     Allergies  Allergen Reactions  . Penicillins   . Septra [Sulfamethoxazole-Trimethoprim]        Objective:    BP 133/78 (BP Location: Right Arm, Patient Position: Sitting, Cuff Size: Normal)   Pulse 81   Temp 97.8 F (36.6 C) (Oral)   Ht _0  (1.626 m)   Wt 212 lb 9.6 oz (96.4 kg)   SpO2 97%   BMI 36.49 kg/m  Wt Readings from Last 3 Encounters:  11/13/17 212 lb 9.6 oz (96.4 kg)  08/12/17 212 lb 3.2 oz (96.3 kg)  07/08/17 214 lb (97.1 kg)    Physical Exam  Constitutional: She is oriented to person, place, and time. She appears well-developed and well-nourished. She is cooperative.  HENT:  Head: Normocephalic and atraumatic.  Eyes: EOM are normal.  Neck: Normal range of motion.  Cardiovascular: Normal rate, regular rhythm and normal heart sounds. Exam reveals no gallop and no friction rub.  No murmur heard. Pulmonary/Chest: Effort normal and breath sounds  normal. No tachypnea. No respiratory distress. She has no decreased breath sounds. She has no wheezes. She has no rhonchi. She has no rales. She exhibits no tenderness.  Abdominal: Soft. Bowel sounds are normal. She exhibits no distension and no mass. There is no tenderness. There is no rebound and no guarding. No hernia.  Musculoskeletal: Normal range of motion. She exhibits no edema.  Neurological: She is alert and oriented to person, place, and time. Coordination normal.  Skin: Skin is warm and dry.  Psychiatric: She has a normal mood and affect. Her behavior is normal. Judgment and thought content normal.  Nursing note and vitals reviewed.      Patient has been counseled extensively about nutrition and exercise as well as the importance of adherence with medications and regular follow-up. The patient was given clear instructions to go to ER or return to medical center if symptoms don't improve, worsen or new problems develop. The patient verbalized understanding.   Follow-up: Return in about 3 months (around 02/13/2018) for HTN/HPL/DM.   Gildardo Pounds, FNP-BC [facility] and [place], [place]   11/15/2017, 2:00 PM

## 2017-11-14 LAB — CBC
Hematocrit: 35.9 % (ref 34.0–46.6)
Hemoglobin: 12.2 g/dL (ref 11.1–15.9)
MCH: 28.8 pg (ref 26.6–33.0)
MCHC: 34 g/dL (ref 31.5–35.7)
MCV: 85 fL (ref 79–97)
Platelets: 307 10*3/uL (ref 150–450)
RBC: 4.23 x10E6/uL (ref 3.77–5.28)
RDW: 13.5 % (ref 12.3–15.4)
WBC: 8.3 10*3/uL (ref 3.4–10.8)

## 2017-11-14 LAB — CMP14+EGFR
ALT: 36 IU/L — ABNORMAL HIGH (ref 0–32)
AST: 27 IU/L (ref 0–40)
Albumin/Globulin Ratio: 1.2 (ref 1.2–2.2)
Albumin: 3.8 g/dL (ref 3.6–4.8)
Alkaline Phosphatase: 182 IU/L — ABNORMAL HIGH (ref 39–117)
BUN/Creatinine Ratio: 17 (ref 12–28)
BUN: 33 mg/dL — ABNORMAL HIGH (ref 8–27)
Bilirubin Total: 0.5 mg/dL (ref 0.0–1.2)
CO2: 22 mmol/L (ref 20–29)
Calcium: 9.4 mg/dL (ref 8.7–10.3)
Chloride: 104 mmol/L (ref 96–106)
Creatinine, Ser: 1.89 mg/dL — ABNORMAL HIGH (ref 0.57–1.00)
GFR calc Af Amer: 31 mL/min/{1.73_m2} — ABNORMAL LOW (ref >59)
GFR calc non Af Amer: 27 mL/min/{1.73_m2} — ABNORMAL LOW (ref >59)
Globulin, Total: 3.1 g/dL (ref 1.5–4.5)
Glucose: 107 mg/dL — ABNORMAL HIGH (ref 65–99)
Potassium: 4.5 mmol/L (ref 3.5–5.2)
Sodium: 141 mmol/L (ref 134–144)
Total Protein: 6.9 g/dL (ref 6.0–8.5)

## 2017-11-14 LAB — VITAMIN D 25 HYDROXY (VIT D DEFICIENCY, FRACTURES): Vit D, 25-Hydroxy: 16.7 ng/mL — ABNORMAL LOW (ref 30.0–100.0)

## 2017-11-15 ENCOUNTER — Encounter: Payer: Self-pay | Admitting: Nurse Practitioner

## 2017-11-15 MED ORDER — VITAMIN D (ERGOCALCIFEROL) 1.25 MG (50000 UNIT) PO CAPS: 50000.0000 [IU] | ORAL_CAPSULE | ORAL | 1 refills | Status: DC

## 2017-11-17 ENCOUNTER — Telehealth: Payer: Self-pay

## 2017-11-17 NOTE — Telephone Encounter (Signed)
CMA spoke to patient to inform on results.  Pt. Verified DOB. Pt. Understood.  Pt. Is aware of Rx was sent to the pharmacy.

## 2017-11-17 NOTE — Telephone Encounter (Signed)
-----   Message from Gildardo Pounds, NP sent at 11/15/2017  2:52 PM EST ----- Kidney function has improved however still above normal limits. Will refer to urology. Vitamin d is low. Will send prescription vitamin d to the pharmacy.

## 2017-12-15 LAB — HM DIABETES EYE EXAM

## 2018-01-08 ENCOUNTER — Other Ambulatory Visit: Payer: Self-pay | Admitting: Nurse Practitioner

## 2018-01-08 DIAGNOSIS — E785 Hyperlipidemia, unspecified: Secondary | ICD-10-CM

## 2018-01-08 DIAGNOSIS — I10 Essential (primary) hypertension: Secondary | ICD-10-CM

## 2018-01-08 DIAGNOSIS — E1169 Type 2 diabetes mellitus with other specified complication: Secondary | ICD-10-CM

## 2018-02-16 ENCOUNTER — Encounter: Payer: Self-pay | Admitting: Nurse Practitioner

## 2018-02-16 ENCOUNTER — Ambulatory Visit: Payer: Medicare Other | Attending: Nurse Practitioner | Admitting: Nurse Practitioner

## 2018-02-16 VITALS — BP 136/92 | HR 71 | Temp 98.3°F | Ht 64.0 in | Wt 216.4 lb

## 2018-02-16 DIAGNOSIS — I10 Essential (primary) hypertension: Secondary | ICD-10-CM | POA: Diagnosis not present

## 2018-02-16 DIAGNOSIS — E1165 Type 2 diabetes mellitus with hyperglycemia: Secondary | ICD-10-CM | POA: Diagnosis not present

## 2018-02-16 DIAGNOSIS — R944 Abnormal results of kidney function studies: Secondary | ICD-10-CM

## 2018-02-16 DIAGNOSIS — E118 Type 2 diabetes mellitus with unspecified complications: Secondary | ICD-10-CM

## 2018-02-16 DIAGNOSIS — Z794 Long term (current) use of insulin: Secondary | ICD-10-CM | POA: Diagnosis not present

## 2018-02-16 LAB — POCT GLYCOSYLATED HEMOGLOBIN (HGB A1C): Hemoglobin A1C: 10.7 % — AB (ref 4.0–5.6)

## 2018-02-16 LAB — GLUCOSE, POCT (MANUAL RESULT ENTRY): POC Glucose: 265 mg/dl — AB (ref 70–99)

## 2018-02-16 MED ORDER — INSULIN PEN NEEDLE 31G X 5 MM MISC: 5 refills | Status: DC

## 2018-02-16 MED ORDER — LIRAGLUTIDE 18 MG/3ML ~~LOC~~ SOPN: PEN_INJECTOR | SUBCUTANEOUS | 3 refills | Status: DC

## 2018-02-16 NOTE — Progress Notes (Signed)
Assessment & Plan:  Hayley Case was seen today for follow-up.  Diagnoses and all orders for this visit:  Type 2 diabetes mellitus with complication, with long-term current use of insulin (HCC) -     Glucose (CBG) -     HgB A1c -     Insulin Pen Needle (B-D UF III MINI PEN NEEDLES) 31G X 5 MM MISC; Use as directed. Check blood glucose levels by fingerstick twice a day.  Decreased GFR -     CMP14+EGFR   Patient has been counseled on age-appropriate routine health concerns for screening and prevention. These are reviewed and up-to-date. Referrals have been placed accordingly. Immunizations are up-to-date or declined.    Subjective:   Chief Complaint  Patient presents with  . Follow-up    Pt. is here to follow-up on HTN,HPL, DM.    HPI Hayley Case 68 y.o. female presents to office today for follow up. Creatinine continues to climb. She is asymptomatic. Will refer to nephrology, decrease Losartan from 133m to 544RXas well as trulicity/victoza. Will increase lantus at this time from 40units BID to 50units BID. She has not been consistent with taking trulicity. We discussed ozempic and victoza but with possible risks of further kidney injury I would not feel comfortable prescribing victoza or ozempic or refilling trulicity.   DM TYPE 2 Taking lantus bid but not trulicity as prescribed due to costs. A1c has increased. She is UTD with eye exam. Weight is up and she endorses diet noncompliance. She is aware of the complications of poorly controlled diabetes.  Lab Results  Component Value Date   HGBA1C 10.7 (A) 02/16/2018   Essential Hypertension She has not been taking her antihypertensives daily as prescribed. Blood pressure slightly elevated today. Current medications include amlodipine 137mdaily and losartan 100 mg and metoprolol 100 mg daily which I am decreasing today due to worsening kidney function. She denies chest pain, shortness of breath, palpitations, lightheadedness, dizziness,  headaches or BLE edema.  She endorses increased stress due to her son's chronic health issues and being his primary caregiver has caused her to not take her medications every day as prescribed.  BP Readings from Last 3 Encounters:  02/16/18 (!) 136/92  11/13/17 133/78  08/12/17 (!) 144/84    Review of Systems  Constitutional: Negative for fever, malaise/fatigue and weight loss.  HENT: Negative.  Negative for nosebleeds.   Eyes: Negative.  Negative for blurred vision, double vision and photophobia.  Respiratory: Negative.  Negative for cough and shortness of breath.   Cardiovascular: Negative.  Negative for chest pain, palpitations and leg swelling.  Gastrointestinal: Negative.  Negative for heartburn, nausea and vomiting.  Musculoskeletal: Negative.  Negative for myalgias.  Neurological: Negative.  Negative for dizziness, focal weakness, seizures and headaches.  Psychiatric/Behavioral: Negative.  Negative for suicidal ideas.    Past Medical History:  Diagnosis Date  . Diabetes mellitus without complication ([place]  . Hyperlipidemia   . Hypertension     Past Surgical History:  Procedure Laterality Date  . BREAST BIOPSY    . BREAST EXCISIONAL BIOPSY Left     History reviewed. No pertinent family history.  Social History Reviewed with no changes to be made today.   Outpatient Medications Prior to Visit  Medication Sig Dispense Refill  . allopurinol (ZYLOPRIM) 100 MG tablet TAKE 1 TABLET BY MOUTH DAILY 90 tablet 0  . amLODipine (NORVASC) 10 MG tablet TAKE 1 TABLET(10 MG) BY MOUTH DAILY 90 tablet 0  . atorvastatin (  LIPITOR) 40 MG tablet TAKE 1 TABLET(40 MG) BY MOUTH DAILY 90 tablet 0  . Blood Glucose Monitoring Suppl (ACCU-CHEK AVIVA PLUS) w/Device KIT Use as instructed 1 kit 0  . Carboxymethylcellulose Sodium (REFRESH TEARS OP) Apply to eye.    . ciprofloxacin (CIPRO) 250 MG tablet Take 250 mg by mouth 2 (two) times daily.    Marland Kitchen glucose blood (ACCU-CHEK ACTIVE STRIPS) test  strip Use as instructed 100 each 12  . Ketotifen Fumarate (EYE ITCH RELIEF OP) Apply to eye.    . Lancets (ACCU-CHEK SOFT TOUCH) lancets Use as instructed Check blood sugar fasting and at bedtime and some days after lunch 100 each 12  . metoprolol succinate (TOPROL-XL) 100 MG 24 hr tablet TAKE 1 TABLET BY MOUTH DAILY. TAKE WITH OR IMMEDIATELY FOLLOWING A MEAL 90 tablet 0  . olopatadine (PATANOL) 0.1 % ophthalmic solution Place 1 drop into both eyes 2 (two) times daily. 5 mL 12  . Vitamin D, Ergocalciferol, (DRISDOL) 50000 units CAPS capsule Take 1 capsule (50,000 Units total) by mouth every 7 (seven) days. 12 capsule 1  . Dulaglutide (TRULICITY) 1.5 ET/6.2OE SOPN INJECT 1.5MG INTO THE SKIN ONCE A WEEK 2 mL 6  . Insulin Pen Needle (B-D UF III MINI PEN NEEDLES) 31G X 5 MM MISC Use as directed. Check blood glucose levels by fingerstick twice a day. 100 each 5  . losartan (COZAAR) 100 MG tablet Take 1 tablet (100 mg total) by mouth daily. 90 tablet 3  . fluticasone (FLONASE) 50 MCG/ACT nasal spray Place 2 sprays into both nostrils daily. (Patient not taking: Reported on 11/13/2017) 16 g 6  . glucose blood test strip Use as instructed (Patient not taking: Reported on 11/13/2017) 100 each 12  . Lancets Thin MISC Use as instructed (Patient not taking: Reported on 11/13/2017) 100 each 3  . mirabegron ER (MYRBETRIQ) 50 MG TB24 tablet Take 50 mg by mouth daily.    . Insulin Glargine (LANTUS SOLOSTAR) 100 UNIT/ML Solostar Pen Inject 40 Units into the skin 2 (two) times daily. 72 mL 6   No facility-administered medications prior to visit.     Allergies  Allergen Reactions  . Penicillins Hives  . Septra [Sulfamethoxazole-Trimethoprim] Hives       Objective:    BP (!) 136/92 (BP Location: Left Arm, Patient Position: Sitting, Cuff Size: Normal)   Pulse 71   Temp 98.3 F (36.8 C) (Oral)   Ht '5\' 4"'  (1.626 m)   Wt 216 lb 6.4 oz (98.2 kg)   SpO2 97%   BMI 37.14 kg/m  Wt Readings from Last 3  Encounters:  02/16/18 216 lb 6.4 oz (98.2 kg)  11/13/17 212 lb 9.6 oz (96.4 kg)  08/12/17 212 lb 3.2 oz (96.3 kg)    Physical Exam Vitals signs and nursing note reviewed.  Constitutional:      Appearance: She is well-developed.  HENT:     Head: Normocephalic and atraumatic.  Neck:     Musculoskeletal: Normal range of motion.  Cardiovascular:     Rate and Rhythm: Normal rate and regular rhythm.     Heart sounds: Normal heart sounds. No murmur. No friction rub. No gallop.   Pulmonary:     Effort: Pulmonary effort is normal. No tachypnea or respiratory distress.     Breath sounds: Normal breath sounds. No decreased breath sounds, wheezing, rhonchi or rales.  Chest:     Chest wall: No tenderness.  Abdominal:     General: Bowel sounds are normal.  Palpations: Abdomen is soft.  Musculoskeletal: Normal range of motion.  Skin:    General: Skin is warm and dry.  Neurological:     Mental Status: She is alert and oriented to person, place, and time.     Coordination: Coordination normal.  Psychiatric:        Behavior: Behavior normal. Behavior is cooperative.        Thought Content: Thought content normal.        Judgment: Judgment normal.          Patient has been counseled extensively about nutrition and exercise as well as the importance of adherence with medications and regular follow-up. The patient was given clear instructions to go to ER or return to medical center if symptoms don't improve, worsen or new problems develop. The patient verbalized understanding.   Follow-up: No follow-ups on file.   Gildardo Pounds, FNP-BC [facility] [place], [place]   02/18/2018, 12:30 PM

## 2018-02-17 ENCOUNTER — Other Ambulatory Visit: Payer: Self-pay | Admitting: Nurse Practitioner

## 2018-02-17 DIAGNOSIS — Z794 Long term (current) use of insulin: Secondary | ICD-10-CM

## 2018-02-17 DIAGNOSIS — N184 Chronic kidney disease, stage 4 (severe): Secondary | ICD-10-CM

## 2018-02-17 DIAGNOSIS — E118 Type 2 diabetes mellitus with unspecified complications: Secondary | ICD-10-CM

## 2018-02-17 DIAGNOSIS — E1122 Type 2 diabetes mellitus with diabetic chronic kidney disease: Secondary | ICD-10-CM

## 2018-02-17 LAB — CMP14+EGFR
ALT: 23 IU/L (ref 0–32)
AST: 15 IU/L (ref 0–40)
Albumin/Globulin Ratio: 1.5 (ref 1.2–2.2)
Albumin: 3.9 g/dL (ref 3.8–4.8)
Alkaline Phosphatase: 216 IU/L — ABNORMAL HIGH (ref 39–117)
BUN/Creatinine Ratio: 16 (ref 12–28)
BUN: 37 mg/dL — ABNORMAL HIGH (ref 8–27)
Bilirubin Total: 0.3 mg/dL (ref 0.0–1.2)
CO2: 21 mmol/L (ref 20–29)
Calcium: 9.2 mg/dL (ref 8.7–10.3)
Chloride: 102 mmol/L (ref 96–106)
Creatinine, Ser: 2.25 mg/dL — ABNORMAL HIGH (ref 0.57–1.00)
GFR calc Af Amer: 25 mL/min/{1.73_m2} — ABNORMAL LOW (ref >59)
GFR calc non Af Amer: 22 mL/min/{1.73_m2} — ABNORMAL LOW (ref >59)
Globulin, Total: 2.6 g/dL (ref 1.5–4.5)
Glucose: 291 mg/dL — ABNORMAL HIGH (ref 65–99)
Potassium: 4.6 mmol/L (ref 3.5–5.2)
Sodium: 138 mmol/L (ref 134–144)
Total Protein: 6.5 g/dL (ref 6.0–8.5)

## 2018-02-17 MED ORDER — LOSARTAN POTASSIUM 50 MG PO TABS: 50.0000 mg | ORAL_TABLET | Freq: Every day | ORAL | 3 refills | Status: DC

## 2018-02-17 MED ORDER — INSULIN GLARGINE 100 UNIT/ML SOLOSTAR PEN: 50.0000 [IU] | PEN_INJECTOR | Freq: Two times a day (BID) | SUBCUTANEOUS | 3 refills | Status: DC

## 2018-02-18 ENCOUNTER — Telehealth: Payer: Self-pay

## 2018-02-18 ENCOUNTER — Encounter: Payer: Self-pay | Admitting: Nurse Practitioner

## 2018-02-18 NOTE — Telephone Encounter (Signed)
CMA spoke to patient to inform on results.  Pt. Understood.  Pt verified DOB.

## 2018-02-18 NOTE — Telephone Encounter (Signed)
-----   Message from Gildardo Pounds, NP sent at 02/17/2018 11:46 PM EST ----- Kidney function has decreased. I am referring you to a kidney specialist. I am also going to decrease your losartan to 50mg . You can take 1/2 tablet. You can take losartan during the day and amlodipine and metoprolol in the evening. I am also taking you off trulicity and will not start ozempic until seen by nephrology. Please increase your lantus to 5o units twice a day instead of 40 units for now.  Dont forget your appointment in a few weeks with the pharmacist and bring your meter.

## 2018-03-16 ENCOUNTER — Ambulatory Visit: Payer: Medicare Other | Attending: Family Medicine | Admitting: Pharmacist

## 2018-03-16 DIAGNOSIS — E1165 Type 2 diabetes mellitus with hyperglycemia: Secondary | ICD-10-CM

## 2018-03-16 NOTE — Patient Instructions (Signed)
Thank you for coming to see me today. Please do the following:  1. Increase Lantus to 45 units twice a day.  2. Continue checking blood sugars at home. It's really important that you record these and bring these in to your next doctor's appointment. If you get in readings above 500 or lower than 70, call me or the clinic to let your doctor know. See below on how to treat low blood sugar.  3. Continue making the lifestyle changes we've discussed together during our visit. Diet and exercise play a significant role in improving your blood sugars.  4. Follow-up with me in 2 weeks.    Hypoglycemia or low blood sugar:   Low blood sugar can happen quickly and may become an emergency if not treated right away.   While this shouldn't happen often, it can be brought upon if you skip a meal or do not eat enough. Also, if your insulin or other diabetes medications are dosed too high, this can cause your blood sugar to go to low.   Warning signs of low blood sugar include: 1. Feeling shaky or dizzy 2. Feeling weak or tired  3. Excessive hunger 4. Feeling anxious or upset  5. Sweating even when you aren't exercising  What to do if I experience low blood sugar? 1. Check your blood sugar with your meter. If lower than 70, proceed to step 2.  2. Treat with 3-4 glucose tablets or 3 packets of regular sugar. If these aren't around, you can try hard candy. Yet another option would be to drink 4 ounces of fruit juice or 6 ounces of REGULAR soda.  3. Re-check your sugar in 15 minutes. If it is still below 70, do what you did in step 2 again. If has come back up, go ahead and eat a snack or small meal at this time.

## 2018-03-16 NOTE — Progress Notes (Signed)
S:    PCP: Zelda   No chief complaint on file.  Patient arrives in good spirits. Presents for diabetes management at the request of Zelda. Patient was referred on 02/16/18. At that visit, A1c came back at 10.7 (up from 8.8). Zelda left a note to have patient increase her Lantus but Ms. Sonntag denies getting the message  Patient reports diabetes was diagnosed >20 yrs ago..   Family/Social History: FHx: no pertinent family history noted Tobacco: never smoker Alcohol: denies use  Insurance coverage/medication affordability: [place]  Patient denies adherence with medications.  Current diabetes medications include: Lantus 50 units BID (reports taking 40 BID) Current hypertension medications include: losartan 50 daily  Patient reports hypoglycemic events. Gives readings in the 50s.   Patient reported dietary habits: - Sausage, toast, hasbrown ,egg for breakfast - does not limit sweets - "stress eats"  Patient-reported exercise habits:  - Silver Sneakers 2 days/week   Patient reports polyuria, polydipsia.  Patient reports neuropathy. Patient reports visual changes. Patient reports self foot exams.   O:  POCT glucose: 208 Home CBG: 57 - 410   Lab Results  Component Value Date   HGBA1C 10.7 (A) 02/16/2018   There were no vitals filed for this visit.  Lipid Panel     Component Value Date/Time   CHOL 201 (H) 02/09/2017 1115   TRIG 125 02/09/2017 1115   HDL 52 02/09/2017 1115   CHOLHDL 3.9 02/09/2017 1115   LDLCALC 124 (H) 02/09/2017 1115   Clinical ASCVD: No  The 10-year ASCVD risk score Mikey Bussing DC Jr., et al., 2013) is: 24.8%   Values used to calculate the score:     Age: 61 years     Sex: Female     Is Non-Hispanic African American: Yes     Diabetic: Yes     Tobacco smoker: No     Systolic Blood Pressure: 237 mmHg     Is BP treated: Yes     HDL Cholesterol: 52 mg/dL     Total Cholesterol: 201 mg/dL   A/P: Diabetes longstanding currently  uncontrolled. Patient is able to verbalize appropriate hypoglycemia management plan. Patient is not adherent with medication. Control is suboptimal due to dietary indiscretion and medication non-compliance.   She has a wide range of glucose values at home. After questioning, pt reveals that she mainly eats breakfast and lunch but not dinner. Her hypoglycemia is occurring in the morning. Encouraged her to start eating dinner with a before bedtime snack. She is amenable to trying this. Pt is also not injecting insulin properly - injection technique discussed in detail.   I think she requires additional insulin with her sugars during the day and given her symptoms. She cannot take metformin d/t renal function. Her renal function is continuing to decline, and her PCP wishes for her to follow-up with nephrology before adding any new medication for DM. Will have her increase to 45 units of Lantus. She knows to call me with any continued hypoglycemia.   -Increased dose of Lantus to 45 units BID.  -Extensively discussed pathophysiology of DM, recommended lifestyle interventions, dietary effects on glycemic control -Counseled on s/sx of and management of hypoglycemia -Next A1C anticipated 05/2018.  -HM: Prevnar, Adacel indicated - pt wishes to receive at future appointment.   ASCVD risk - primary prevention in patient with DM. Last LDL is not controlled. ASCVD risk score is >20%  - high intensity statin indicated. -Continued atorvastatin 40 mg.   Written patient  instructions provided.  Total time in face to face counseling 15 minutes.   Follow up Pharmacist visit 03/30/18.     Patient seen with:  Bobbye Riggs, PharmD Candidate  [employer]  Class of 2022  Benard Halsted, PharmD, [place] [phone]

## 2018-03-17 ENCOUNTER — Encounter: Payer: Self-pay | Admitting: Pharmacist

## 2018-03-30 ENCOUNTER — Ambulatory Visit: Payer: Medicare Other | Admitting: Pharmacist

## 2018-04-19 ENCOUNTER — Other Ambulatory Visit: Payer: Self-pay | Admitting: Nurse Practitioner

## 2018-04-21 ENCOUNTER — Encounter: Payer: Medicare Other | Admitting: Nurse Practitioner

## 2018-04-26 ENCOUNTER — Other Ambulatory Visit: Payer: Self-pay | Admitting: Nurse Practitioner

## 2018-04-26 ENCOUNTER — Encounter: Payer: Self-pay | Admitting: Nurse Practitioner

## 2018-04-26 DIAGNOSIS — E1169 Type 2 diabetes mellitus with other specified complication: Secondary | ICD-10-CM

## 2018-04-26 DIAGNOSIS — I10 Essential (primary) hypertension: Secondary | ICD-10-CM

## 2018-04-26 DIAGNOSIS — E785 Hyperlipidemia, unspecified: Principal | ICD-10-CM

## 2018-04-26 MED ORDER — AMLODIPINE BESYLATE 10 MG PO TABS: ORAL_TABLET | ORAL | 0 refills | Status: DC

## 2018-04-26 MED ORDER — ALLOPURINOL 100 MG PO TABS: 100.0000 mg | ORAL_TABLET | Freq: Every day | ORAL | 0 refills | Status: DC

## 2018-04-26 MED ORDER — ATORVASTATIN CALCIUM 40 MG PO TABS: ORAL_TABLET | ORAL | 1 refills | Status: DC

## 2018-04-26 MED ORDER — MISC. DEVICES MISC: 0 refills | Status: DC

## 2018-04-26 MED ORDER — METOPROLOL SUCCINATE ER 100 MG PO TB24: ORAL_TABLET | ORAL | 0 refills | Status: DC

## 2018-04-26 MED ORDER — HYDRALAZINE HCL 10 MG PO TABS: 10.0000 mg | ORAL_TABLET | Freq: Three times a day (TID) | ORAL | 0 refills | Status: DC

## 2018-05-17 ENCOUNTER — Other Ambulatory Visit: Payer: Self-pay

## 2018-05-17 ENCOUNTER — Encounter: Payer: Self-pay | Admitting: Nurse Practitioner

## 2018-05-17 ENCOUNTER — Ambulatory Visit: Payer: Medicare Other | Attending: Nurse Practitioner | Admitting: Nurse Practitioner

## 2018-05-17 DIAGNOSIS — M545 Low back pain, unspecified: Secondary | ICD-10-CM

## 2018-05-17 DIAGNOSIS — K5909 Other constipation: Secondary | ICD-10-CM

## 2018-05-17 DIAGNOSIS — E118 Type 2 diabetes mellitus with unspecified complications: Secondary | ICD-10-CM

## 2018-05-17 DIAGNOSIS — Z794 Long term (current) use of insulin: Secondary | ICD-10-CM | POA: Diagnosis not present

## 2018-05-17 DIAGNOSIS — I10 Essential (primary) hypertension: Secondary | ICD-10-CM

## 2018-05-17 MED ORDER — METHOCARBAMOL 500 MG PO TABS: 500.0000 mg | ORAL_TABLET | Freq: Three times a day (TID) | ORAL | 1 refills | Status: AC | PRN

## 2018-05-17 MED ORDER — INSULIN GLARGINE 100 UNIT/ML SOLOSTAR PEN: PEN_INJECTOR | SUBCUTANEOUS | 3 refills | Status: DC

## 2018-05-17 MED ORDER — SENNOSIDES-DOCUSATE SODIUM 8.6-50 MG PO TABS: 2.0000 | ORAL_TABLET | Freq: Every day | ORAL | 2 refills | Status: AC

## 2018-05-17 MED ORDER — MISC. DEVICES MISC: 0 refills | Status: DC

## 2018-05-17 NOTE — Progress Notes (Signed)
Virtual Visit via Telephone Note Due to national recommendations of social distancing due to [place] 19, telehealth visit is felt to be most appropriate for this patient at this time.  I discussed the limitations, risks, security and privacy concerns of performing an evaluation and management service by telephone and the availability of in person appointments. I also discussed with the patient that there may be a patient responsible charge related to this service. The patient expressed understanding and agreed to proceed.    I connected with Hayley Case on 05/17/18  at   9:50 AM EDT EDT by telephone and verified that I am speaking with the correct person using two identifiers.   Consent I discussed the limitations, risks, security and privacy concerns of performing an evaluation and management service by telephone and the availability of in person appointments. I also discussed with the patient that there may be a patient responsible charge related to this service. The patient expressed understanding and agreed to proceed.   Location of Patient: Private Residence   Location of Provider: [place] and [place] participating in Telemedicine visit: [name] FNP-BC [place]    History of Present Illness: Telemedicine visit for: Follow up. PMH: DM TYPE 2, CKD stage 4, mixed hyperlipidemia and HTN.  She endorses increased stressors: her only son (other son is deceased) has renal failure and will be on HD at home. She is his main caregiver and apprehensive about assisting with his HD. Also has not been out of the house much and this has caused some depression (declines start of SSRI today).  Has complaints of back pain today.   DM TYPE 2 Eating "bad" since onset of COVID-19 and stay at home. Not cooking and eating more takeout. Endorses fasting reading today 67 with post prandial readings in the 200s.. She is not diet or exercise  compliant. Taking 35 units of Lantus BID. I have instructed her to decrease her nighttime Lantus to 25 units and continue her lantus 35 units during the day. She will contact me via my chart with updated blood glucose readings. Oral diabetic agents are limited due to CKD.  Lab Results  Component Value Date   HGBA1C 10.7 (A) 02/16/2018    Essential Hypertension Blood pressure not well controlled. She does not monitor her blood pressure at home. She will need a blood pressure monitor. I have requested that her insurance supply her with one. She is not diet or exercise compliant. Denies chest pain, shortness of breath, palpitations, lightheadedness, dizziness, headaches or BLE edema.  BP Readings from Last 3 Encounters:  02/16/18 (!) 136/92  11/13/17 133/78  08/12/17 (!) 144/84   Back Pain Started last Friday. She denies any trauma, injury or sciatica. Back pain is lower left side and aggravated by prolonged sitting, standing or lying on her left side. She does state she has been lying around a lot more over the past few weeks. Using heating pad which does provide some relief of her pain.  Denies any new GU symptoms including flank pain or involuntary loss of bowel or bladder. However she has not had a BM movement in a few days.     Past Medical History:  Diagnosis Date  . Diabetes mellitus without complication ([place])   . Hyperlipidemia   . Hypertension     Past Surgical History:  Procedure Laterality Date  . BREAST BIOPSY    . BREAST EXCISIONAL BIOPSY Left  History reviewed. No pertinent family history.  Social History   Socioeconomic History  . Marital status: Single    Spouse name: Not on file  . Number of children: Not on file  . Years of education: Not on file  . Highest education level: Not on file  Occupational History  . Not on file  Social Needs  . Financial resource strain: Not on file  . Food insecurity:    Worry: Not on file    Inability: Not on file  .  Transportation needs:    Medical: Not on file    Non-medical: Not on file  Tobacco Use  . Smoking status: Never Smoker  . Smokeless tobacco: Never Used  Substance and Sexual Activity  . Alcohol use: No  . Drug use: No  . Sexual activity: Not Currently  Lifestyle  . Physical activity:    Days per week: Not on file    Minutes per session: Not on file  . Stress: Not on file  Relationships  . Social connections:    Talks on phone: Not on file    Gets together: Not on file    Attends religious service: Not on file    Active member of club or organization: Not on file    Attends meetings of clubs or organizations: Not on file    Relationship status: Not on file  Other Topics Concern  . Not on file  Social History Narrative  . Not on file     Observations/Objective: Awake, alert and oriented x 3   Review of Systems  Constitutional: Negative for fever, malaise/fatigue and weight loss.  HENT: Negative.  Negative for nosebleeds.   Eyes: Negative.  Negative for blurred vision, double vision and photophobia.  Respiratory: Negative.  Negative for cough and shortness of breath.   Cardiovascular: Negative.  Negative for chest pain, palpitations and leg swelling.  Gastrointestinal: Positive for constipation. Negative for heartburn, nausea and vomiting.  Musculoskeletal: Positive for back pain. Negative for myalgias.  Neurological: Negative.  Negative for dizziness, focal weakness, seizures and headaches.  Psychiatric/Behavioral: Negative.  Negative for suicidal ideas.    Assessment and Plan: Diagnoses and all orders for this visit:  Type 2 diabetes mellitus with complication, with long-term current use of insulin ([place]) -     Misc. Devices MISC; Please provide patient with insurance approved blood pressure monitor. -     Insulin Glargine (LANTUS SOLOSTAR) 100 UNIT/ML Solostar Pen; Inject 25 units into the skin nightly and 35 units into skin during the day  Other constipation -      senna-docusate (SENOKOT-S) 8.6-50 MG tablet; Take 2 tablets by mouth daily for 30 days.  Acute left-sided low back pain without sciatica -     methocarbamol (ROBAXIN) 500 MG tablet; Take 1 tablet (500 mg total) by mouth every 8 (eight) hours as needed for up to 14 days for muscle spasms.     Follow Up Instructions Return in about 2 months (around 07/17/2018).     I discussed the assessment and treatment plan with the patient. The patient was provided an opportunity to ask questions and all were answered. The patient agreed with the plan and demonstrated an understanding of the instructions.   The patient was advised to call back or seek an in-person evaluation if the symptoms worsen or if the condition fails to improve as anticipated.  I provided 24 minutes of non-face-to-face time during this encounter including median intraservice time, reviewing previous notes, labs, imaging, medications  and explaining diagnosis and management.  Gildardo Pounds, FNP-BC

## 2018-05-18 ENCOUNTER — Other Ambulatory Visit: Payer: Self-pay | Admitting: Nurse Practitioner

## 2018-05-18 ENCOUNTER — Ambulatory Visit: Payer: Medicare Other | Attending: Nurse Practitioner

## 2018-05-18 ENCOUNTER — Other Ambulatory Visit: Payer: Self-pay

## 2018-05-18 DIAGNOSIS — IMO0002 Reserved for concepts with insufficient information to code with codable children: Secondary | ICD-10-CM

## 2018-05-18 DIAGNOSIS — Z780 Asymptomatic menopausal state: Secondary | ICD-10-CM

## 2018-05-18 DIAGNOSIS — E118 Type 2 diabetes mellitus with unspecified complications: Principal | ICD-10-CM

## 2018-05-18 DIAGNOSIS — E1165 Type 2 diabetes mellitus with hyperglycemia: Secondary | ICD-10-CM

## 2018-05-19 ENCOUNTER — Other Ambulatory Visit: Payer: Self-pay | Admitting: Nurse Practitioner

## 2018-05-19 DIAGNOSIS — R748 Abnormal levels of other serum enzymes: Secondary | ICD-10-CM

## 2018-05-19 LAB — CBC
Hematocrit: 34 % (ref 34.0–46.6)
Hemoglobin: 11.7 g/dL (ref 11.1–15.9)
MCH: 29.5 pg (ref 26.6–33.0)
MCHC: 34.4 g/dL (ref 31.5–35.7)
MCV: 86 fL (ref 79–97)
Platelets: 291 10*3/uL (ref 150–450)
RBC: 3.97 x10E6/uL (ref 3.77–5.28)
RDW: 13.2 % (ref 11.7–15.4)
WBC: 6.4 10*3/uL (ref 3.4–10.8)

## 2018-05-19 LAB — CMP14+EGFR
ALT: 167 IU/L — ABNORMAL HIGH (ref 0–32)
AST: 89 IU/L — ABNORMAL HIGH (ref 0–40)
Albumin/Globulin Ratio: 1.3 (ref 1.2–2.2)
Albumin: 3.6 g/dL — ABNORMAL LOW (ref 3.8–4.8)
Alkaline Phosphatase: 319 IU/L — ABNORMAL HIGH (ref 39–117)
BUN/Creatinine Ratio: 20 (ref 12–28)
BUN: 38 mg/dL — ABNORMAL HIGH (ref 8–27)
Bilirubin Total: 0.3 mg/dL (ref 0.0–1.2)
CO2: 21 mmol/L (ref 20–29)
Calcium: 9.1 mg/dL (ref 8.7–10.3)
Chloride: 107 mmol/L — ABNORMAL HIGH (ref 96–106)
Creatinine, Ser: 1.89 mg/dL — ABNORMAL HIGH (ref 0.57–1.00)
GFR calc Af Amer: 31 mL/min/{1.73_m2} — ABNORMAL LOW (ref >59)
GFR calc non Af Amer: 27 mL/min/{1.73_m2} — ABNORMAL LOW (ref >59)
Globulin, Total: 2.8 g/dL (ref 1.5–4.5)
Glucose: 326 mg/dL — ABNORMAL HIGH (ref 65–99)
Potassium: 5.2 mmol/L (ref 3.5–5.2)
Sodium: 139 mmol/L (ref 134–144)
Total Protein: 6.4 g/dL (ref 6.0–8.5)

## 2018-05-19 LAB — VITAMIN D 25 HYDROXY (VIT D DEFICIENCY, FRACTURES): Vit D, 25-Hydroxy: 21.3 ng/mL — ABNORMAL LOW (ref 30.0–100.0)

## 2018-05-19 LAB — LIPID PANEL
Chol/HDL Ratio: 4.9 ratio — ABNORMAL HIGH (ref 0.0–4.4)
Cholesterol, Total: 240 mg/dL — ABNORMAL HIGH (ref 100–199)
HDL: 49 mg/dL (ref >39)
LDL Calculated: 132 mg/dL — ABNORMAL HIGH (ref 0–99)
Triglycerides: 296 mg/dL — ABNORMAL HIGH (ref 0–149)
VLDL Cholesterol Cal: 59 mg/dL — ABNORMAL HIGH (ref 5–40)

## 2018-05-19 LAB — HEMOGLOBIN A1C
Est. average glucose Bld gHb Est-mCnc: 243 mg/dL
Hgb A1c MFr Bld: 10.1 % — ABNORMAL HIGH (ref 4.8–5.6)

## 2018-05-25 ENCOUNTER — Encounter: Payer: Self-pay | Admitting: Nurse Practitioner

## 2018-05-25 ENCOUNTER — Other Ambulatory Visit: Payer: Self-pay | Admitting: Nurse Practitioner

## 2018-05-25 DIAGNOSIS — R748 Abnormal levels of other serum enzymes: Secondary | ICD-10-CM

## 2018-05-25 DIAGNOSIS — R7989 Other specified abnormal findings of blood chemistry: Secondary | ICD-10-CM

## 2018-05-31 ENCOUNTER — Other Ambulatory Visit: Payer: Self-pay | Admitting: Nephrology

## 2018-05-31 DIAGNOSIS — N184 Chronic kidney disease, stage 4 (severe): Secondary | ICD-10-CM

## 2018-05-31 NOTE — Progress Notes (Signed)
CMA spoke to patient to inform of date and time of her scheduled Ultrasound.  Pt. Understood.

## 2018-06-03 LAB — SPECIMEN STATUS REPORT

## 2018-06-08 ENCOUNTER — Other Ambulatory Visit: Payer: Self-pay

## 2018-06-08 ENCOUNTER — Ambulatory Visit (HOSPITAL_COMMUNITY)
Admission: RE | Admit: 2018-06-08 | Discharge: 2018-06-08 | Disposition: A | Discharge status: Home / Self Care | Payer: Medicare Other | Source: Ambulatory Visit | Attending: Nurse Practitioner | Admitting: Nurse Practitioner

## 2018-06-08 DIAGNOSIS — R945 Abnormal results of liver function studies: Secondary | ICD-10-CM | POA: Insufficient documentation

## 2018-06-08 DIAGNOSIS — R748 Abnormal levels of other serum enzymes: Secondary | ICD-10-CM

## 2018-06-08 DIAGNOSIS — R7989 Other specified abnormal findings of blood chemistry: Secondary | ICD-10-CM

## 2018-06-10 ENCOUNTER — Other Ambulatory Visit: Payer: Self-pay

## 2018-06-10 ENCOUNTER — Ambulatory Visit
Admission: RE | Admit: 2018-06-10 | Discharge: 2018-06-10 | Disposition: A | Discharge status: Home / Self Care | Payer: Medicare Other | Source: Ambulatory Visit | Attending: Nephrology | Admitting: Nephrology

## 2018-06-10 DIAGNOSIS — N184 Chronic kidney disease, stage 4 (severe): Secondary | ICD-10-CM

## 2018-06-11 ENCOUNTER — Encounter: Payer: Medicare Other | Admitting: Nurse Practitioner

## 2018-06-11 ENCOUNTER — Ambulatory Visit: Payer: Medicare Other | Attending: Nurse Practitioner | Admitting: Nurse Practitioner

## 2018-06-11 ENCOUNTER — Encounter: Payer: Self-pay | Admitting: Nurse Practitioner

## 2018-06-11 DIAGNOSIS — E782 Mixed hyperlipidemia: Secondary | ICD-10-CM

## 2018-06-11 DIAGNOSIS — E118 Type 2 diabetes mellitus with unspecified complications: Secondary | ICD-10-CM | POA: Diagnosis not present

## 2018-06-11 DIAGNOSIS — I10 Essential (primary) hypertension: Secondary | ICD-10-CM

## 2018-06-11 DIAGNOSIS — Z794 Long term (current) use of insulin: Secondary | ICD-10-CM

## 2018-06-11 MED ORDER — FUROSEMIDE 20 MG PO TABS: 20.0000 mg | ORAL_TABLET | Freq: Every day | ORAL | 0 refills | Status: DC | PRN

## 2018-06-11 NOTE — Progress Notes (Signed)
Virtual Visit via Telephone Note Due to national recommendations of social distancing due to [place] 19, telehealth visit is felt to be most appropriate for this patient at this time.  I discussed the limitations, risks, security and privacy concerns of performing an evaluation and management service by telephone and the availability of in person appointments. I also discussed with the patient that there may be a patient responsible charge related to this service. The patient expressed understanding and agreed to proceed.    I connected with Hayley Case on 06/11/18  at   9:50 AM EDT  EDT by telephone and verified that I am speaking with the correct person using two identifiers.   Consent I discussed the limitations, risks, security and privacy concerns of performing an evaluation and management service by telephone and the availability of in person appointments. I also discussed with the patient that there may be a patient responsible charge related to this service. The patient expressed understanding and agreed to proceed.   Location of Patient: Private Residence    Location of Provider: [place] [facility] [employer] Office    Persons participating in Telemedicine visit: Hayley Rankins FNP-BC [name]    History of Present Illness: Telemedicine visit for: HTN and DM   Diabetes Mellitus Type 2 Chronic and not well controlled. She admits to not being diet or medication compliance. Current medications include: lantus 25 units nightly and 35 units daily. She has not been eating healthy. We discussed dietary and exercise modifications today including carbohydrate intake, sugars and fat. Lab Results  Component Value Date   HGBA1C 10.1 (H) 05/18/2018     ESSENTIAL HYPERTENSION Blood Pressure not well controlled. She does not take her blood pressure medication at the same time every day. Toprol XL 100 mg daily, Hydralazine 10 mg TID, amlodipine 10 mg daily. She  endorses Right BLE edema and denies pain or tenderness, warmth or difficulty with ambulation. Will order furosemide prn. She denies chest pain, shortness of breath, palpitations, lightheadedness, dizziness, headaches.  BP Readings from Last 3 Encounters:  02/16/18 (!) 136/92  11/13/17 133/78  08/12/17 (!) 144/84    Hyperlipidemia Lab Results  Component Value Date   LDLCALC 132 (H) 05/18/2018  LDL not at goal. Athough she is not stain intolerant she does not consistently take her atorvastatin 40 mg daily as prescribed.   Past Medical History:  Diagnosis Date  . Diabetes mellitus without complication ([place])   . Hyperlipidemia   . Hypertension     Past Surgical History:  Procedure Laterality Date  . BREAST BIOPSY    . BREAST EXCISIONAL BIOPSY Left     History reviewed. No pertinent family history.  Social History   Socioeconomic History  . Marital status: Widowed    Spouse name: Not on file  . Number of children: Not on file  . Years of education: Not on file  . Highest education level: Not on file  Occupational History  . Not on file  Social Needs  . Financial resource strain: Not on file  . Food insecurity:    Worry: Not on file    Inability: Not on file  . Transportation needs:    Medical: Not on file    Non-medical: Not on file  Tobacco Use  . Smoking status: Never Smoker  . Smokeless tobacco: Never Used  Substance and Sexual Activity  . Alcohol use: No  . Drug use: No  . Sexual activity: Not Currently  Lifestyle  .  Physical activity:    Days per week: Not on file    Minutes per session: Not on file  . Stress: Not on file  Relationships  . Social connections:    Talks on phone: Not on file    Gets together: Not on file    Attends religious service: Not on file    Active member of club or organization: Not on file    Attends meetings of clubs or organizations: Not on file    Relationship status: Not on file  Other Topics Concern  . Not on file  Social  History Narrative  . Not on file     Observations/Objective: Awake, alert and oriented x 3   Review of Systems  Constitutional: Negative for fever, malaise/fatigue and weight loss.  HENT: Negative.  Negative for nosebleeds.   Eyes: Negative.  Negative for blurred vision, double vision and photophobia.  Respiratory: Negative.  Negative for cough and shortness of breath.   Cardiovascular: Negative.  Negative for chest pain, palpitations and leg swelling.  Gastrointestinal: Negative.  Negative for heartburn, nausea and vomiting.  Musculoskeletal: Negative.  Negative for myalgias.  Neurological: Negative.  Negative for dizziness, focal weakness, seizures and headaches.  Psychiatric/Behavioral: Negative.  Negative for suicidal ideas.    Assessment and Plan: Hayley Case was evaluated today for leg swelling.  Diagnoses and all orders for this visit:  Type 2 diabetes mellitus with complication, with long-term current use of insulin (HCC) -     Insulin Glargine (LANTUS SOLOSTAR) 100 UNIT/ML Solostar Pen; Inject 25 units into the skin nightly and 35 units into skin during the day Diabetes is poorly controlled. Advised patient to keep a fasting blood sugar log fast, 2 hours post lunch and bedtime which will be reviewed at the next office visit.  Essential hypertension -     furosemide (LASIX) 20 MG tablet; Take 1 tablet (20 mg total) by mouth daily as needed for up to 30 days for edema. Continue all antihypertensives as prescribed.  Remember to bring in your blood pressure log with you for your follow up appointment.  DASH/Mediterranean Diets are healthier choices for HTN.    Mixed hyperlipidemia INSTRUCTIONS: Work on a low fat, heart healthy diet and participate in regular aerobic exercise program by working out at least 150 minutes per week; 5 days a week-30 minutes per day. Avoid red meat, fried foods. junk foods, sodas, sugary drinks, unhealthy snacking, alcohol and smoking.  Drink at least  48oz of water per day and monitor your carbohydrate intake daily.      Follow Up Instructions Return in about 3 months (around 09/11/2018) for HTN/HPL/DM.     I discussed the assessment and treatment plan with the patient. The patient was provided an opportunity to ask questions and all were answered. The patient agreed with the plan and demonstrated an understanding of the instructions.   The patient was advised to call back or seek an in-person evaluation if the symptoms worsen or if the condition fails to improve as anticipated.  I provided  minutes of non-face-to-face time during this encounter including median intraservice time, reviewing previous notes, labs, imaging, medications and explaining diagnosis and management.  Gildardo Pounds, FNP-BC

## 2018-06-13 ENCOUNTER — Encounter: Payer: Self-pay | Admitting: Nurse Practitioner

## 2018-06-13 MED ORDER — INSULIN GLARGINE 100 UNIT/ML SOLOSTAR PEN: PEN_INJECTOR | SUBCUTANEOUS | 3 refills | Status: DC

## 2018-06-15 ENCOUNTER — Other Ambulatory Visit: Payer: Self-pay

## 2018-06-15 ENCOUNTER — Encounter: Payer: Self-pay | Admitting: Nurse Practitioner

## 2018-06-15 ENCOUNTER — Ambulatory Visit: Payer: Medicare Other | Attending: Nurse Practitioner | Admitting: Nurse Practitioner

## 2018-06-15 DIAGNOSIS — E1165 Type 2 diabetes mellitus with hyperglycemia: Secondary | ICD-10-CM

## 2018-06-15 DIAGNOSIS — R600 Localized edema: Secondary | ICD-10-CM

## 2018-06-15 DIAGNOSIS — R1032 Left lower quadrant pain: Secondary | ICD-10-CM | POA: Diagnosis not present

## 2018-06-15 DIAGNOSIS — I10 Essential (primary) hypertension: Secondary | ICD-10-CM

## 2018-06-15 DIAGNOSIS — E559 Vitamin D deficiency, unspecified: Secondary | ICD-10-CM

## 2018-06-15 MED ORDER — VITAMIN D (ERGOCALCIFEROL) 1.25 MG (50000 UNIT) PO CAPS: 50000.0000 [IU] | ORAL_CAPSULE | ORAL | 1 refills | Status: DC

## 2018-06-15 MED ORDER — MEDICAL COMPRESSION STOCKINGS MISC: 0 refills | Status: DC

## 2018-06-15 NOTE — Progress Notes (Signed)
Virtual Visit via Telephone Note Due to national recommendations of social distancing due to [place] 19, telehealth visit is felt to be most appropriate for this patient at this time.  I discussed the limitations, risks, security and privacy concerns of performing an evaluation and management service by telephone and the availability of in person appointments. I also discussed with the patient that there may be a patient responsible charge related to this service. The patient expressed understanding and agreed to proceed.    I connected with Hayley Case on 06/15/18  at  10:30 AM EDT  EDT by telephone and verified that I am speaking with the correct person using two identifiers.   Consent I discussed the limitations, risks, security and privacy concerns of performing an evaluation and management service by telephone and the availability of in person appointments. I also discussed with the patient that there may be a patient responsible charge related to this service. The patient expressed understanding and agreed to proceed.   Location of Patient: Private residence   Location of Provider: [place] [facility] [place] participating in Telemedicine visit: [name] FNP-BC [place]    History of Present Illness: Telemedicine visit for: LLQ pain   Abdominal Pain: Patient complains of abdominal pain. The pain is described as aching, dull and pressure-like, and is 3/10 in intensity. Pain is located in the LLQ without radiation. Onset was a few days ago. Symptoms have been unchanged since. Aggravating factors: pressure and sitting up.  Alleviating factors: standing however pain still remains but decreased. Associated symptoms: none. The patient denies constipation, diarrhea, dysuria, fever, hematochezia, hematuria, melena, nausea and vomiting.  Essential Hypertension She recently had medications changes made by nephrology. Currently taking  Toprol XL 100 mg daily, amlodipine 10 mg daily, and now Lisinopril 5mg  daily.  BP Readings from Last 3 Encounters:  02/16/18 (!) 136/92  11/13/17 133/78  08/12/17 (!) 144/84      Past Medical History:  Diagnosis Date  . Diabetes mellitus without complication ([place])   . Hyperlipidemia   . Hypertension     Past Surgical History:  Procedure Laterality Date  . BREAST BIOPSY    . BREAST EXCISIONAL BIOPSY Left     History reviewed. No pertinent family history.  Social History   Socioeconomic History  . Marital status: Widowed    Spouse name: Not on file  . Number of children: Not on file  . Years of education: Not on file  . Highest education level: Not on file  Occupational History  . Not on file  Social Needs  . Financial resource strain: Not on file  . Food insecurity:    Worry: Not on file    Inability: Not on file  . Transportation needs:    Medical: Not on file    Non-medical: Not on file  Tobacco Use  . Smoking status: Never Smoker  . Smokeless tobacco: Never Used  Substance and Sexual Activity  . Alcohol use: No  . Drug use: No  . Sexual activity: Not Currently  Lifestyle  . Physical activity:    Days per week: Not on file    Minutes per session: Not on file  . Stress: Not on file  Relationships  . Social connections:    Talks on phone: Not on file    Gets together: Not on file    Attends religious service: Not on file    Active member of club or  organization: Not on file    Attends meetings of clubs or organizations: Not on file    Relationship status: Not on file  Other Topics Concern  . Not on file  Social History Narrative  . Not on file     Observations/Objective: Awake, alert and oriented x 3   Review of Systems  Constitutional: Negative for fever, malaise/fatigue and weight loss.  HENT: Negative.  Negative for nosebleeds.   Eyes: Negative.  Negative for blurred vision, double vision and photophobia.  Respiratory: Negative.  Negative for  cough and shortness of breath.   Cardiovascular: Positive for leg swelling. Negative for chest pain and palpitations.  Gastrointestinal: Positive for abdominal pain and constipation (takes senna which provides relief of symptoms). Negative for diarrhea, heartburn, nausea and vomiting.  Musculoskeletal: Negative.  Negative for myalgias.  Neurological: Negative.  Negative for dizziness, focal weakness, seizures and headaches.  Psychiatric/Behavioral: Negative.  Negative for suicidal ideas.    Assessment and Plan: Hayley Case was evaluated today for follow-up.  Diagnoses and all orders for this visit:  LLQ pain -     Urinalysis Dipstick  Essential hypertension Continue all antihypertensives as prescribed.  Remember to bring in your blood pressure log with you for your follow up appointment.  DASH/Mediterranean Diets are healthier choices for HTN.   Leg edema, right -     Elastic Bandages & Supports (MEDICAL COMPRESSION STOCKINGS) MISC; Apply to both legs during the day.  Type 2 diabetes mellitus with hyperglycemia, without long-term current use of insulin (HCC) -     Microalbumin/Creatinine Ratio, Urine Diabetes is poorly controlled. Advised patient to keep a fasting blood sugar log fast, 2 hours post lunch and bedtime which will be reviewed at the next office visit.  Vitamin D deficiency disease Continue vitamin D as prescribed   Follow Up Instructions Return in about 3 months (around 09/15/2018).    I discussed the assessment and treatment plan with the patient. The patient was provided an opportunity to ask questions and all were answered. The patient agreed with the plan and demonstrated an understanding of the instructions.   The patient was advised to call back or seek an in-person evaluation if the symptoms worsen or if the condition fails to improve as anticipated.  I provided 24 minutes of non-face-to-face time during this encounter including median intraservice time, reviewing  previous notes, labs, imaging, medications and explaining diagnosis and management.  Gildardo Pounds, FNP-BC

## 2018-07-06 ENCOUNTER — Ambulatory Visit: Payer: Medicare Other | Attending: Nurse Practitioner | Admitting: Nurse Practitioner

## 2018-07-06 ENCOUNTER — Encounter: Payer: Self-pay | Admitting: Nurse Practitioner

## 2018-07-06 ENCOUNTER — Other Ambulatory Visit: Payer: Self-pay

## 2018-07-06 VITALS — BP 129/73 | HR 85 | Temp 98.4°F | Ht 64.0 in | Wt 214.0 lb

## 2018-07-06 DIAGNOSIS — L299 Pruritus, unspecified: Secondary | ICD-10-CM

## 2018-07-06 DIAGNOSIS — E1165 Type 2 diabetes mellitus with hyperglycemia: Secondary | ICD-10-CM | POA: Diagnosis not present

## 2018-07-06 DIAGNOSIS — R3129 Other microscopic hematuria: Secondary | ICD-10-CM

## 2018-07-06 DIAGNOSIS — I10 Essential (primary) hypertension: Secondary | ICD-10-CM

## 2018-07-06 LAB — POCT URINALYSIS DIP (CLINITEK)
Glucose, UA: 100 mg/dL — AB
Nitrite, UA: NEGATIVE
POC PROTEIN,UA: >=300 — AB
Spec Grav, UA: >=1.03 — AB (ref 1.010–1.025)
Urobilinogen, UA: 2 E.U./dL — AB
pH, UA: 5 (ref 5.0–8.0)

## 2018-07-06 LAB — GLUCOSE, POCT (MANUAL RESULT ENTRY): POC Glucose: 260 mg/dl — AB (ref 70–99)

## 2018-07-06 NOTE — Progress Notes (Signed)
Assessment & Plan:  Hayley Case was seen today for diabetes and hypertension.  Diagnoses and all orders for this visit:  Pruritus Stop lisinopril  Essential hypertension Continue all antihypertensives as prescribed.  Remember to bring in your blood pressure log with you for your follow up appointment.  DASH/Mediterranean Diets are healthier choices for HTN.    Type 2 diabetes mellitus with hyperglycemia, without long-term current use of insulin (HCC) -     Glucose (CBG) -     POCT URINALYSIS DIP (CLINITEK) Diabetes is poorly controlled. Advised patient to keep a fasting blood sugar log fast, 2 hours post lunch and bedtime which will be reviewed at the next office visit. She is not diet or exercise compliant. Sometimes misses doses of diabetes medications as well.  Lab Results  Component Value Date   HGBA1C 10.1 (H) 05/18/2018   Other microscopic hematuria -     CULTURE, URINE COMPREHENSIVE -     Urinalysis, Complete    Patient has been counseled on age-appropriate routine health concerns for screening and prevention. These are reviewed and up-to-date. Referrals have been placed accordingly. Immunizations are up-to-date or declined.    Subjective:   Chief Complaint  Patient presents with  . Diabetes    Pt. stated her blood sugar been high and blood pressure been high. Pt. stated she also have back pain and itchness all over her body.   . Hypertension   HPI Hayley Case 68 y.o. female presents to office today for BP check and reportedly High home glucose readings.   has a past medical history of CKD (chronic kidney disease), Diabetes mellitus without complication ([place]), Hyperlipidemia, and Hypertension. Started on lisinopril 5 mg by nephrologist and reports a week or two after taking the lisinopril she began to experience generalized pruritis. She was instructed to stop lisinopril x 1 week and if the itching stops then will need to dc lisinopril and inform nephrology.  She  also has very high PHQ9 scores and GAD. If lisinopril is not the cause of her itching. May be related to anxiety/depression. Will start buspar low dose and SSRI. She is agreeable.  Endorses lots of stress taking care of her son who is currently having issues with home hemodialysis. She has another son who died several years ago due to chronic health issues and her son's current issues have caused her to worry if he will also die soon.  She endorses low back pain, insomnia and decreased appetite. Denies any thoughts of self harm.  Depression screen [facility] 2/9 07/06/2018 02/16/2018 11/13/2017 08/12/2017 07/08/2017  Decreased Interest '3 2 1 1 1  ' Down, Depressed, Hopeless '3 2 1 2 1  ' PHQ - 2 Score '6 4 2 3 2  ' Altered sleeping '3 3 2 2 2  ' Tired, decreased energy '3 3 2 2 2  ' Change in appetite '3 2 2 2 2  ' Feeling bad or failure about yourself  '3 2 2 1 2  ' Trouble concentrating '2 1 2 2 2  ' Moving slowly or fidgety/restless 0 '1 1 1 1  ' Suicidal thoughts 0 0 0 0 0  PHQ-9 Score '20 16 13 13 13  ' Some recent data might be hidden     Essential Hypertension Blood pressure well controlled today. She endorses medication compliance. Denies chest pain, shortness of breath, palpitations, lightheadedness, dizziness, headaches or BLE edema.  Taking amlodipine 78m  Daily, metoprolol xl 100 mg daily.  BP Readings from Last 3 Encounters:  07/06/18 129/73  02/16/18 (Marland Kitchen  136/92  11/13/17 133/78    Review of Systems  Constitutional: Negative for fever, malaise/fatigue and weight loss.  HENT: Negative.  Negative for nosebleeds.   Eyes: Negative.  Negative for blurred vision, double vision and photophobia.  Respiratory: Negative.  Negative for cough and shortness of breath.   Cardiovascular: Negative.  Negative for chest pain, palpitations and leg swelling.  Gastrointestinal: Negative.  Negative for heartburn, nausea and vomiting.  Musculoskeletal: Negative.  Negative for myalgias.  Skin: Positive for itching. Negative for rash.   Neurological: Negative.  Negative for dizziness, focal weakness, seizures and headaches.  Psychiatric/Behavioral: Positive for depression. Negative for hallucinations and suicidal ideas. The patient is nervous/anxious and has insomnia.     Past Medical History:  Diagnosis Date  . CKD (chronic kidney disease)   . Diabetes mellitus without complication ([place])   . Hyperlipidemia   . Hypertension     Past Surgical History:  Procedure Laterality Date  . BREAST BIOPSY    . BREAST EXCISIONAL BIOPSY Left     History reviewed. No pertinent family history.  Social History Reviewed with no changes to be made today.   Outpatient Medications Prior to Visit  Medication Sig Dispense Refill  . allopurinol (ZYLOPRIM) 100 MG tablet Take 1 tablet (100 mg total) by mouth daily. 90 tablet 0  . amLODipine (NORVASC) 10 MG tablet TAKE 1 TABLET(10 MG) BY MOUTH DAILY 90 tablet 0  . atorvastatin (LIPITOR) 40 MG tablet TAKE 1 TABLET(40 MG) BY MOUTH DAILY 90 tablet 1  . Blood Glucose Monitoring Suppl (ACCU-CHEK AVIVA PLUS) w/Device KIT Use as instructed 1 kit 0  . Carboxymethylcellulose Sodium (REFRESH TEARS OP) Apply to eye.    . Cholecalciferol (VITAMIN D) 50 MCG (2000 UT) tablet Take 2,000 Units by mouth daily.    Marland Kitchen glucose blood (ACCU-CHEK ACTIVE STRIPS) test strip Use as instructed 100 each 12  . Insulin Glargine (LANTUS SOLOSTAR) 100 UNIT/ML Solostar Pen Inject 25 units into the skin nightly and 35 units into skin during the day 90 mL 3  . Insulin Pen Needle (B-D UF III MINI PEN NEEDLES) 31G X 5 MM MISC Use as directed. Check blood glucose levels by fingerstick twice a day. 100 each 5  . Ketotifen Fumarate (EYE ITCH RELIEF OP) Apply to eye.    . Lancets (ACCU-CHEK SOFT TOUCH) lancets Use as instructed Check blood sugar fasting and at bedtime and some days after lunch 100 each 12  . lisinopril (ZESTRIL) 5 MG tablet Take 5 mg by mouth daily.    . metoprolol succinate (TOPROL-XL) 100 MG 24 hr tablet TAKE  1 TABLET BY MOUTH DAILY. TAKE WITH OR IMMEDIATELY FOLLOWING A MEAL 90 tablet 0  . mirabegron ER (MYRBETRIQ) 50 MG TB24 tablet Take 50 mg by mouth daily.    . Misc. Devices MISC Please provide patient with insurance approved blood pressure monitor. 1 each 0  . Vitamin D, Ergocalciferol, (DRISDOL) 1.25 MG (50000 UT) CAPS capsule Take 1 capsule (50,000 Units total) by mouth every 7 (seven) days. 12 capsule 1  . Elastic Bandages & Supports (MEDICAL COMPRESSION STOCKINGS) MISC Apply to both legs during the day. (Patient not taking: Reported on 07/06/2018) 1 each 0  . fluticasone (FLONASE) 50 MCG/ACT nasal spray Place 2 sprays into both nostrils daily. (Patient not taking: Reported on 11/13/2017) 16 g 6  . furosemide (LASIX) 20 MG tablet Take 1 tablet (20 mg total) by mouth daily as needed for up to 30 days for edema. (Patient not taking:  Reported on 07/06/2018) 15 tablet 0  . glucose blood test strip Use as instructed (Patient not taking: Reported on 11/13/2017) 100 each 12  . Lancets Thin MISC Use as instructed (Patient not taking: Reported on 11/13/2017) 100 each 3  . olopatadine (PATANOL) 0.1 % ophthalmic solution Place 1 drop into both eyes 2 (two) times daily. (Patient not taking: Reported on 07/06/2018) 5 mL 12   No facility-administered medications prior to visit.     Allergies  Allergen Reactions  . Lisinopril Cough  . Penicillins Hives  . Septra [Sulfamethoxazole-Trimethoprim] Hives       Objective:    BP 129/73 (BP Location: Left Arm, Patient Position: Sitting, Cuff Size: Large)   Pulse 85   Temp 98.4 F (36.9 C) (Oral)   Ht '5\' 4"'  (1.626 m)   Wt 214 lb (97.1 kg)   SpO2 98%   BMI 36.73 kg/m  Wt Readings from Last 3 Encounters:  07/06/18 214 lb (97.1 kg)  02/16/18 216 lb 6.4 oz (98.2 kg)  11/13/17 212 lb 9.6 oz (96.4 kg)    Physical Exam Vitals signs and nursing note reviewed.  Constitutional:      Appearance: She is well-developed.  HENT:     Head: Normocephalic and  atraumatic.  Neck:     Musculoskeletal: Normal range of motion.  Cardiovascular:     Rate and Rhythm: Normal rate and regular rhythm.     Heart sounds: Normal heart sounds. No murmur. No friction rub. No gallop.   Pulmonary:     Effort: Pulmonary effort is normal. No tachypnea or respiratory distress.     Breath sounds: Normal breath sounds. No decreased breath sounds, wheezing, rhonchi or rales.  Chest:     Chest wall: No tenderness.  Abdominal:     General: Bowel sounds are normal.     Palpations: Abdomen is soft.     Tenderness: There is no right CVA tenderness, left CVA tenderness, guarding or rebound.  Musculoskeletal: Normal range of motion.     Lumbar back: She exhibits pain (TTP ).  Skin:    General: Skin is warm and dry.  Neurological:     Mental Status: She is alert and oriented to person, place, and time.     Coordination: Coordination normal.  Psychiatric:        Behavior: Behavior normal. Behavior is cooperative.        Thought Content: Thought content normal.        Judgment: Judgment normal.        Patient has been counseled extensively about nutrition and exercise as well as the importance of adherence with medications and regular follow-up. The patient was given clear instructions to go to ER or return to medical center if symptoms don't improve, worsen or new problems develop. The patient verbalized understanding.   Follow-up: Return in about 4 weeks (around 08/03/2018) for depression.   Gildardo Pounds, FNP-BC [facility] [place], [place]   07/06/2018, 7:42 PM

## 2018-07-07 ENCOUNTER — Other Ambulatory Visit: Payer: Self-pay | Admitting: Nurse Practitioner

## 2018-07-07 LAB — URINALYSIS, COMPLETE
Bilirubin, UA: NEGATIVE
Nitrite, UA: NEGATIVE
Specific Gravity, UA: 1.028 (ref 1.005–1.030)
Urobilinogen, Ur: 1 mg/dL (ref 0.2–1.0)
pH, UA: 5 (ref 5.0–7.5)

## 2018-07-07 LAB — MICROSCOPIC EXAMINATION
Casts: NONE SEEN /lpf
WBC, UA: >30 /hpf — AB (ref 0–5)

## 2018-07-07 MED ORDER — CIPROFLOXACIN HCL 500 MG PO TABS: 500.0000 mg | ORAL_TABLET | Freq: Two times a day (BID) | ORAL | 0 refills | Status: DC

## 2018-07-08 LAB — CULTURE, URINE COMPREHENSIVE

## 2018-07-12 ENCOUNTER — Emergency Department (HOSPITAL_COMMUNITY): Payer: Medicare Other

## 2018-07-12 ENCOUNTER — Other Ambulatory Visit: Payer: Self-pay

## 2018-07-12 ENCOUNTER — Inpatient Hospital Stay (HOSPITAL_COMMUNITY)
Admission: EM | Admit: 2018-07-12 | Discharge: 2018-07-15 | DRG: 442 | Disposition: A | Discharge status: Home / Self Care | Payer: Medicare Other | Attending: Internal Medicine | Admitting: Internal Medicine

## 2018-07-12 ENCOUNTER — Encounter (HOSPITAL_COMMUNITY): Payer: Self-pay | Admitting: Emergency Medicine

## 2018-07-12 DIAGNOSIS — Z79899 Other long term (current) drug therapy: Secondary | ICD-10-CM

## 2018-07-12 DIAGNOSIS — I10 Essential (primary) hypertension: Secondary | ICD-10-CM | POA: Diagnosis present

## 2018-07-12 DIAGNOSIS — Z1159 Encounter for screening for other viral diseases: Secondary | ICD-10-CM

## 2018-07-12 DIAGNOSIS — B179 Acute viral hepatitis, unspecified: Secondary | ICD-10-CM | POA: Diagnosis present

## 2018-07-12 DIAGNOSIS — E1165 Type 2 diabetes mellitus with hyperglycemia: Secondary | ICD-10-CM | POA: Diagnosis present

## 2018-07-12 DIAGNOSIS — N183 Chronic kidney disease, stage 3 (moderate): Secondary | ICD-10-CM | POA: Diagnosis not present

## 2018-07-12 DIAGNOSIS — E1122 Type 2 diabetes mellitus with diabetic chronic kidney disease: Secondary | ICD-10-CM | POA: Diagnosis present

## 2018-07-12 DIAGNOSIS — N289 Disorder of kidney and ureter, unspecified: Secondary | ICD-10-CM

## 2018-07-12 DIAGNOSIS — K5909 Other constipation: Secondary | ICD-10-CM | POA: Diagnosis present

## 2018-07-12 DIAGNOSIS — Z794 Long term (current) use of insulin: Secondary | ICD-10-CM | POA: Diagnosis not present

## 2018-07-12 DIAGNOSIS — N189 Chronic kidney disease, unspecified: Secondary | ICD-10-CM

## 2018-07-12 DIAGNOSIS — E860 Dehydration: Secondary | ICD-10-CM | POA: Diagnosis present

## 2018-07-12 DIAGNOSIS — I129 Hypertensive chronic kidney disease with stage 1 through stage 4 chronic kidney disease, or unspecified chronic kidney disease: Secondary | ICD-10-CM | POA: Diagnosis present

## 2018-07-12 DIAGNOSIS — Z881 Allergy status to other antibiotic agents status: Secondary | ICD-10-CM | POA: Diagnosis not present

## 2018-07-12 DIAGNOSIS — K719 Toxic liver disease, unspecified: Principal | ICD-10-CM | POA: Diagnosis present

## 2018-07-12 DIAGNOSIS — Z66 Do not resuscitate: Secondary | ICD-10-CM | POA: Diagnosis present

## 2018-07-12 DIAGNOSIS — R1110 Vomiting, unspecified: Secondary | ICD-10-CM

## 2018-07-12 DIAGNOSIS — Z880 Allergy status to penicillin: Secondary | ICD-10-CM | POA: Diagnosis not present

## 2018-07-12 DIAGNOSIS — Z6835 Body mass index (BMI) 35.0-35.9, adult: Secondary | ICD-10-CM | POA: Diagnosis not present

## 2018-07-12 DIAGNOSIS — E119 Type 2 diabetes mellitus without complications: Secondary | ICD-10-CM

## 2018-07-12 DIAGNOSIS — R740 Nonspecific elevation of levels of transaminase and lactic acid dehydrogenase [LDH]: Secondary | ICD-10-CM | POA: Diagnosis not present

## 2018-07-12 DIAGNOSIS — E669 Obesity, unspecified: Secondary | ICD-10-CM | POA: Diagnosis present

## 2018-07-12 DIAGNOSIS — N184 Chronic kidney disease, stage 4 (severe): Secondary | ICD-10-CM | POA: Diagnosis present

## 2018-07-12 DIAGNOSIS — Z888 Allergy status to other drugs, medicaments and biological substances status: Secondary | ICD-10-CM | POA: Diagnosis not present

## 2018-07-12 DIAGNOSIS — S36119S Unspecified injury of liver, sequela: Secondary | ICD-10-CM | POA: Insufficient documentation

## 2018-07-12 DIAGNOSIS — Z87440 Personal history of urinary (tract) infections: Secondary | ICD-10-CM | POA: Diagnosis not present

## 2018-07-12 DIAGNOSIS — N179 Acute kidney failure, unspecified: Secondary | ICD-10-CM | POA: Diagnosis present

## 2018-07-12 DIAGNOSIS — R112 Nausea with vomiting, unspecified: Secondary | ICD-10-CM

## 2018-07-12 DIAGNOSIS — E785 Hyperlipidemia, unspecified: Secondary | ICD-10-CM | POA: Diagnosis present

## 2018-07-12 DIAGNOSIS — R7989 Other specified abnormal findings of blood chemistry: Secondary | ICD-10-CM

## 2018-07-12 LAB — CBC WITH DIFFERENTIAL/PLATELET
Abs Immature Granulocytes: 0.01 10*3/uL (ref 0.00–0.07)
Basophils Absolute: 0 10*3/uL (ref 0.0–0.1)
Basophils Relative: 1 %
Eosinophils Absolute: 0 10*3/uL (ref 0.0–0.5)
Eosinophils Relative: 1 %
HCT: 41.4 % (ref 36.0–46.0)
Hemoglobin: 13.6 g/dL (ref 12.0–15.0)
Immature Granulocytes: 0 %
Lymphocytes Relative: 17 %
Lymphs Abs: 1.2 10*3/uL (ref 0.7–4.0)
MCH: 28.3 pg (ref 26.0–34.0)
MCHC: 32.9 g/dL (ref 30.0–36.0)
MCV: 86.3 fL (ref 80.0–100.0)
Monocytes Absolute: 0.6 10*3/uL (ref 0.1–1.0)
Monocytes Relative: 9 %
Neutro Abs: 5 10*3/uL (ref 1.7–7.7)
Neutrophils Relative %: 72 %
Platelets: 271 10*3/uL (ref 150–400)
RBC: 4.8 MIL/uL (ref 3.87–5.11)
RDW: 14 % (ref 11.5–15.5)
WBC: 6.9 10*3/uL (ref 4.0–10.5)
nRBC: 0 % (ref 0.0–0.2)

## 2018-07-12 LAB — URINALYSIS, ROUTINE W REFLEX MICROSCOPIC
Glucose, UA: 250 mg/dL — AB
Ketones, ur: NEGATIVE mg/dL
Nitrite: NEGATIVE
Protein, ur: >300 mg/dL — AB
Specific Gravity, Urine: >1.03 — ABNORMAL HIGH (ref 1.005–1.030)
pH: 5 (ref 5.0–8.0)

## 2018-07-12 LAB — HEPATIC FUNCTION PANEL
ALT: 922 U/L — ABNORMAL HIGH (ref 0–44)
AST: 695 U/L — ABNORMAL HIGH (ref 15–41)
Albumin: 3.2 g/dL — ABNORMAL LOW (ref 3.5–5.0)
Alkaline Phosphatase: 529 U/L — ABNORMAL HIGH (ref 38–126)
Bilirubin, Direct: 1 mg/dL — ABNORMAL HIGH (ref 0.0–0.2)
Indirect Bilirubin: 0.9 mg/dL (ref 0.3–0.9)
Total Bilirubin: 1.9 mg/dL — ABNORMAL HIGH (ref 0.3–1.2)
Total Protein: 8.1 g/dL (ref 6.5–8.1)

## 2018-07-12 LAB — URINALYSIS, MICROSCOPIC (REFLEX)

## 2018-07-12 LAB — COMPREHENSIVE METABOLIC PANEL
ALT: 914 U/L — ABNORMAL HIGH (ref 0–44)
AST: 687 U/L — ABNORMAL HIGH (ref 15–41)
Albumin: 3.2 g/dL — ABNORMAL LOW (ref 3.5–5.0)
Alkaline Phosphatase: 524 U/L — ABNORMAL HIGH (ref 38–126)
Anion gap: 15 (ref 5–15)
BUN: 28 mg/dL — ABNORMAL HIGH (ref 8–23)
CO2: 20 mmol/L — ABNORMAL LOW (ref 22–32)
Calcium: 9.8 mg/dL (ref 8.9–10.3)
Chloride: 99 mmol/L (ref 98–111)
Creatinine, Ser: 2.73 mg/dL — ABNORMAL HIGH (ref 0.44–1.00)
GFR calc Af Amer: 20 mL/min — ABNORMAL LOW (ref >60)
GFR calc non Af Amer: 17 mL/min — ABNORMAL LOW (ref >60)
Glucose, Bld: 298 mg/dL — ABNORMAL HIGH (ref 70–99)
Potassium: 4.4 mmol/L (ref 3.5–5.1)
Sodium: 134 mmol/L — ABNORMAL LOW (ref 135–145)
Total Bilirubin: 2.3 mg/dL — ABNORMAL HIGH (ref 0.3–1.2)
Total Protein: 8.2 g/dL — ABNORMAL HIGH (ref 6.5–8.1)

## 2018-07-12 LAB — GLUCOSE, CAPILLARY
Glucose-Capillary: 259 mg/dL — ABNORMAL HIGH (ref 70–99)
Glucose-Capillary: 212 mg/dL — ABNORMAL HIGH (ref 70–99)

## 2018-07-12 LAB — PROTIME-INR
INR: 1 (ref 0.8–1.2)
Prothrombin Time: 13.5 seconds (ref 11.4–15.2)

## 2018-07-12 LAB — LACTATE DEHYDROGENASE: LDH: 380 U/L — ABNORMAL HIGH (ref 98–192)

## 2018-07-12 LAB — SARS CORONAVIRUS 2 BY RT PCR (HOSPITAL ORDER, PERFORMED IN ~~LOC~~ HOSPITAL LAB): SARS Coronavirus 2: NEGATIVE

## 2018-07-12 LAB — LIPASE, BLOOD: Lipase: 46 U/L (ref 11–51)

## 2018-07-12 LAB — ACETAMINOPHEN LEVEL: Acetaminophen (Tylenol), Serum: <10 ug/mL — ABNORMAL LOW (ref 10–30)

## 2018-07-12 MED ORDER — SODIUM CHLORIDE 0.9 % IV BOLUS
1000.0000 mL | Freq: Once | INTRAVENOUS | Status: AC
  Administered 2018-07-12: 1000 mL via INTRAVENOUS

## 2018-07-12 MED ORDER — INSULIN GLARGINE 100 UNIT/ML ~~LOC~~ SOLN
20.0000 [IU] | Freq: Every day | SUBCUTANEOUS | Status: DC
  Administered 2018-07-13 – 2018-07-14 (×2): 20 [IU] via SUBCUTANEOUS
  Filled 2018-07-12 (×2): qty 0.2

## 2018-07-12 MED ORDER — ONDANSETRON HCL 4 MG/2ML IJ SOLN
4.0000 mg | Freq: Once | INTRAMUSCULAR | Status: AC
  Administered 2018-07-12: 4 mg via INTRAVENOUS
  Filled 2018-07-12: qty 2

## 2018-07-12 MED ORDER — INSULIN GLARGINE 100 UNIT/ML ~~LOC~~ SOLN
35.0000 [IU] | Freq: Every day | SUBCUTANEOUS | Status: DC
  Administered 2018-07-13: 35 [IU] via SUBCUTANEOUS
  Filled 2018-07-12 (×2): qty 0.35

## 2018-07-12 MED ORDER — SODIUM CHLORIDE 0.9 % IV SOLN
INTRAVENOUS | Status: DC
  Administered 2018-07-12: 18:00:00 via INTRAVENOUS

## 2018-07-12 MED ORDER — RAMELTEON 8 MG PO TABS
8.0000 mg | ORAL_TABLET | Freq: Every day | ORAL | Status: DC
  Administered 2018-07-12 – 2018-07-14 (×3): 8 mg via ORAL
  Filled 2018-07-12 (×5): qty 1

## 2018-07-12 MED ORDER — INSULIN GLARGINE 100 UNIT/ML ~~LOC~~ SOLN: 35.0000 [IU] | Freq: Every morning | SUBCUTANEOUS | Status: DC

## 2018-07-12 MED ORDER — AMLODIPINE BESYLATE 5 MG PO TABS
10.0000 mg | ORAL_TABLET | Freq: Every day | ORAL | Status: DC
  Administered 2018-07-13 – 2018-07-15 (×3): 10 mg via ORAL
  Filled 2018-07-12 (×3): qty 2

## 2018-07-12 MED ORDER — SENNA 8.6 MG PO TABS
1.0000 | ORAL_TABLET | Freq: Every day | ORAL | Status: DC | PRN
  Administered 2018-07-12 – 2018-07-14 (×3): 8.6 mg via ORAL
  Filled 2018-07-12 (×3): qty 1

## 2018-07-12 MED ORDER — ALLOPURINOL 100 MG PO TABS
100.0000 mg | ORAL_TABLET | Freq: Every day | ORAL | Status: DC
  Administered 2018-07-12 – 2018-07-15 (×4): 100 mg via ORAL
  Filled 2018-07-12 (×4): qty 1

## 2018-07-12 MED ORDER — INSULIN GLARGINE 100 UNIT/ML ~~LOC~~ SOLN: 20.0000 [IU] | Freq: Every morning | SUBCUTANEOUS | Status: DC

## 2018-07-12 MED ORDER — INSULIN ASPART 100 UNIT/ML ~~LOC~~ SOLN
0.0000 [IU] | Freq: Three times a day (TID) | SUBCUTANEOUS | Status: DC
  Administered 2018-07-12: 8 [IU] via SUBCUTANEOUS
  Administered 2018-07-13: 5 [IU] via SUBCUTANEOUS
  Administered 2018-07-13 (×2): 11 [IU] via SUBCUTANEOUS
  Administered 2018-07-14 (×3): 8 [IU] via SUBCUTANEOUS
  Administered 2018-07-15: 5 [IU] via SUBCUTANEOUS
  Administered 2018-07-15: 8 [IU] via SUBCUTANEOUS
  Administered 2018-07-15: 3 [IU] via SUBCUTANEOUS

## 2018-07-12 MED ORDER — METOPROLOL SUCCINATE ER 100 MG PO TB24
100.0000 mg | ORAL_TABLET | Freq: Every day | ORAL | Status: DC
  Administered 2018-07-12 – 2018-07-15 (×4): 100 mg via ORAL
  Filled 2018-07-12 (×4): qty 1

## 2018-07-12 MED ORDER — HEPARIN SODIUM (PORCINE) 5000 UNIT/ML IJ SOLN
5000.0000 [IU] | Freq: Three times a day (TID) | INTRAMUSCULAR | Status: DC
  Administered 2018-07-13 – 2018-07-14 (×3): 5000 [IU] via SUBCUTANEOUS
  Filled 2018-07-12 (×6): qty 1

## 2018-07-12 MED ORDER — POLYETHYLENE GLYCOL 3350 17 G PO PACK
17.0000 g | PACK | Freq: Two times a day (BID) | ORAL | Status: DC | PRN
  Administered 2018-07-12 – 2018-07-14 (×4): 17 g via ORAL
  Filled 2018-07-12 (×4): qty 1

## 2018-07-12 MED ORDER — ONDANSETRON HCL 4 MG/2ML IJ SOLN
4.0000 mg | Freq: Four times a day (QID) | INTRAMUSCULAR | Status: DC | PRN
  Filled 2018-07-12: qty 2

## 2018-07-12 NOTE — ED Provider Notes (Signed)
[facility] EMERGENCY DEPARTMENT Provider Note   CSN: [id number] Arrival date & time: 07/12/18  1134     History   Chief Complaint Chief Complaint  Patient presents with   Emesis    HPI Hayley Case is a 68 y.o. female with a hx of CKD, T2DM, HTN, & hyperlipidemia who presents to the ED w/ complaints of vomiting & generalized weakness for the past 2-3 days. Patient states she has had dysuria for just under 1 week, seen by PCP, placed on cipro BID for 5 days, today is last day of abx, urinary sxs have overall improved but remains with some mild dysuria. A few days prior she developed nausea & vomiting, has had 3-4 episodes of emesis per day, feels she cannot really keep anything down. She has been taking the abx but often vomits about 1-2 hours after. She has had poor PO intake & feels generally weak & exhausted. Notes last BM was 4 days prior which is somewhat atypical for her. Has had some generalized pruritus w/o rash. Denies fever, chills, abdominal pain, hematemesis, melena, hematochezia, dyspnea, or chest pain. She has had increased stress/anxiety with her son recently being started on dialysis as her other son passed away, denies SI/HI.      HPI  Past Medical History:  Diagnosis Date   CKD (chronic kidney disease)    Diabetes mellitus without complication ([place])    Hyperlipidemia    Hypertension     Patient Active Problem List   Diagnosis Date Noted   Diabetes mellitus ([place]) 12/08/2016   Essential hypertension 12/08/2016   Hyperlipidemia associated with type 2 diabetes mellitus ([place]) 12/08/2016    Past Surgical History:  Procedure Laterality Date   BREAST BIOPSY     BREAST EXCISIONAL BIOPSY Left      OB History   No obstetric history on file.      Home Medications    Prior to Admission medications   Medication Sig Start Date End Date Taking? Authorizing Provider  allopurinol (ZYLOPRIM) 100 MG tablet Take 1 tablet (100 mg total)  by mouth daily. 04/26/18   Gildardo Pounds, NP  amLODipine (NORVASC) 10 MG tablet TAKE 1 TABLET(10 MG) BY MOUTH DAILY 04/26/18   Gildardo Pounds, NP  atorvastatin (LIPITOR) 40 MG tablet TAKE 1 TABLET(40 MG) BY MOUTH DAILY 04/26/18   Gildardo Pounds, NP  Blood Glucose Monitoring Suppl (ACCU-CHEK AVIVA PLUS) w/Device KIT Use as instructed 02/10/17   Gildardo Pounds, NP  Carboxymethylcellulose Sodium (REFRESH TEARS OP) Apply to eye.    [provider]  Cholecalciferol (VITAMIN D) 50 MCG (2000 UT) tablet Take 2,000 Units by mouth daily.    [provider]  ciprofloxacin (CIPRO) 500 MG tablet Take 1 tablet (500 mg total) by mouth 2 (two) times daily for 5 days. 07/07/18 07/12/18  Gildardo Pounds, NP  Elastic Bandages & Supports ([place]) MISC Apply to both legs during the day. Patient not taking: Reported on 07/06/2018 06/15/18   Gildardo Pounds, NP  fluticasone [facility]) 50 MCG/ACT nasal spray Place 2 sprays into both nostrils daily. Patient not taking: Reported on 11/13/2017 07/08/17   Argentina Donovan, PA-C  furosemide (LASIX) 20 MG tablet Take 1 tablet (20 mg total) by mouth daily as needed for up to 30 days for edema. Patient not taking: Reported on 07/06/2018 06/11/18 07/11/18  Gildardo Pounds, NP  glucose blood [facility] ACTIVE STRIPS) test strip Use as instructed 02/10/17  Gildardo Pounds, NP  glucose blood test strip Use as instructed Patient not taking: Reported on 11/13/2017 02/17/17   Gildardo Pounds, NP  Insulin Glargine (LANTUS SOLOSTAR) 100 UNIT/ML Solostar Pen Inject 25 units into the skin nightly and 35 units into skin during the day 06/13/18   Gildardo Pounds, NP  Insulin Pen Needle (B-D UF III MINI PEN NEEDLES) 31G X 5 MM MISC Use as directed. Check blood glucose levels by fingerstick twice a day. 02/16/18   Gildardo Pounds, NP  Ketotifen Fumarate (EYE ITCH RELIEF OP) Apply to eye.    [provider]  Lancets (ACCU-CHEK SOFT TOUCH) lancets  Use as instructed Check blood sugar fasting and at bedtime and some days after lunch 02/13/17   Gildardo Pounds, NP  Lancets Thin MISC Use as instructed Patient not taking: Reported on 11/13/2017 02/17/17   Gildardo Pounds, NP  lisinopril (ZESTRIL) 5 MG tablet Take 5 mg by mouth daily.    [provider]  metoprolol succinate (TOPROL-XL) 100 MG 24 hr tablet TAKE 1 TABLET BY MOUTH DAILY. TAKE WITH OR IMMEDIATELY FOLLOWING A MEAL 04/26/18   Gildardo Pounds, NP  mirabegron ER (MYRBETRIQ) 50 MG TB24 tablet Take 50 mg by mouth daily.    [provider]  Misc. Devices MISC Please provide patient with insurance approved blood pressure monitor. 05/17/18   Gildardo Pounds, NP  olopatadine (PATANOL) 0.1 % ophthalmic solution Place 1 drop into both eyes 2 (two) times daily. Patient not taking: Reported on 07/06/2018 07/08/17   Argentina Donovan, PA-C  Vitamin D, Ergocalciferol, (DRISDOL) 1.25 MG (50000 UT) CAPS capsule Take 1 capsule (50,000 Units total) by mouth every 7 (seven) days. 06/15/18   Gildardo Pounds, NP    Family History No family history on file.  Social History Social History   Tobacco Use   Smoking status: Never Smoker   Smokeless tobacco: Never Used  Substance Use Topics   Alcohol use: No   Drug use: No     Allergies   Lisinopril, Penicillins, and Septra [sulfamethoxazole-trimethoprim]   Review of Systems Review of Systems  Constitutional: Positive for fatigue. Negative for chills and fever.  Respiratory: Negative for shortness of breath.   Cardiovascular: Negative for chest pain.  Gastrointestinal: Positive for constipation, nausea and vomiting. Negative for abdominal pain, anal bleeding, blood in stool and diarrhea.  Genitourinary: Positive for dysuria. Negative for frequency and urgency.  Skin: Negative for rash.       + for pruritus.   Neurological: Positive for weakness (generalized).  Psychiatric/Behavioral: Negative for suicidal ideas. The patient  is nervous/anxious.   All other systems reviewed and are negative.  Physical Exam Updated Vital Signs BP 125/71 (BP Location: Left Arm)    Pulse 98    Temp 98.7 F (37.1 C) (Oral)    Resp 18    Ht '5\' 5"'  (1.651 m)    Wt 97.1 kg    SpO2 98%    BMI 35.61 kg/m   Physical Exam Vitals signs and nursing note reviewed.  Constitutional:      General: She is not in acute distress.    Appearance: She is well-developed. She is not toxic-appearing.  HENT:     Head: Normocephalic and atraumatic.     Mouth/Throat:     Mouth: Mucous membranes are dry.  Eyes:     General: No scleral icterus.       Right eye: No discharge.  Left eye: No discharge.     Conjunctiva/sclera: Conjunctivae normal.  Neck:     Musculoskeletal: Neck supple.  Cardiovascular:     Rate and Rhythm: Regular rhythm. Tachycardia present.  Pulmonary:     Effort: Pulmonary effort is normal. No respiratory distress.     Breath sounds: Normal breath sounds. No wheezing, rhonchi or rales.  Abdominal:     General: There is no distension.     Palpations: Abdomen is soft.     Tenderness: There is abdominal tenderness (bilateral lower quadrants). There is no right CVA tenderness, left CVA tenderness, guarding or rebound.  Skin:    General: Skin is warm and dry.     Findings: No rash.     Comments: Excoriations noted to the arms & upper back in areas patient reports she was scratching. No signs of superimposed infection.   Neurological:     General: No focal deficit present.     Mental Status: She is alert.     Comments: Clear speech.   Psychiatric:        Behavior: Behavior normal.    ED Treatments / Results  Labs (all labs ordered are listed, but only abnormal results are displayed) Labs Reviewed  COMPREHENSIVE METABOLIC PANEL - Abnormal; Notable for the following components:      Result Value   Sodium 134 (*)    CO2 20 (*)    Glucose, Bld 298 (*)    BUN 28 (*)    Creatinine, Ser 2.73 (*)    Total Protein 8.2  (*)    Albumin 3.2 (*)    AST 687 (*)    ALT 914 (*)    Alkaline Phosphatase 524 (*)    Total Bilirubin 2.3 (*)    GFR calc non Af Amer 17 (*)    GFR calc Af Amer 20 (*)    All other components within normal limits  URINALYSIS, ROUTINE W REFLEX MICROSCOPIC - Abnormal; Notable for the following components:   APPearance HAZY (*)    Specific Gravity, Urine >1.030 (*)    Glucose, UA 250 (*)    Hgb urine dipstick MODERATE (*)    Bilirubin Urine MODERATE (*)    Protein, ur >300 (*)    Leukocytes,Ua TRACE (*)    All other components within normal limits  URINALYSIS, MICROSCOPIC (REFLEX) - Abnormal; Notable for the following components:   Bacteria, UA FEW (*)    All other components within normal limits  URINE CULTURE  CBC WITH DIFFERENTIAL/PLATELET  LIPASE, BLOOD  ACETAMINOPHEN LEVEL  HEPATITIS PANEL, ACUTE    EKG    Radiology Ct Abdomen Pelvis Wo Contrast  Result Date: 07/12/2018 CLINICAL DATA:  Lower abdominal pain for 10 days. UTI. Nausea and vomiting. EXAM: CT ABDOMEN AND PELVIS WITHOUT CONTRAST TECHNIQUE: Multidetector CT imaging of the abdomen and pelvis was performed following the standard protocol without IV contrast. COMPARISON:  None. FINDINGS: Lower chest: The lung bases are clear of acute process. No pleural effusion or pulmonary lesions. The heart is normal in size. No pericardial effusion. Aortic and coronary artery calcifications are noted. The distal esophagus and aorta are unremarkable. Small hiatal hernia. Hepatobiliary: No focal hepatic lesions or intrahepatic biliary dilatation. The gallbladder is unremarkable. No common bile duct dilatation. Pancreas: No mass, inflammation or ductal dilatation. Spleen: Normal size.  No focal lesions. Adrenals/Urinary Tract: The adrenal glands and kidneys are unremarkable. No renal, ureteral or bladder calculi or mass is identified without contrast. Stomach/Bowel: The stomach, duodenum, small bowel  and colon are grossly normal without  oral contrast. No inflammatory changes, mass lesions or obstructive findings. The terminal ileum and appendix are normal. Vascular/Lymphatic: Moderate to advanced atherosclerotic calcifications for age but no aneurysm. No mesenteric or retroperitoneal mass or adenopathy. Reproductive: The uterus and ovaries are unremarkable. Other: No pelvic mass or adenopathy. No free pelvic fluid collections. No inguinal mass or adenopathy. No abdominal wall hernia or subcutaneous lesions. Musculoskeletal: Musculoskeletal no significant bony findings. IMPRESSION: 1. No acute abdominal/pelvic findings, mass lesions or adenopathy without contrast. 2. Age advanced atherosclerotic calcifications involving the aorta and branch vessels including coronary artery calcifications. 3. No renal, ureteral or bladder calculi or mass. Electronically Signed   By: Marijo Sanes M.D.   On: 07/12/2018 15:11    Procedures Procedures (including critical care time)  Medications Ordered in ED Medications  sodium chloride 0.9 % bolus 1,000 mL (0 mLs Intravenous Stopped 07/12/18 1315)  ondansetron (ZOFRAN) injection 4 mg (4 mg Intravenous Given 07/12/18 1235)    Initial Impression / Assessment and Plan / ED Course  I have reviewed the triage vital signs and the nursing notes.  Pertinent labs & imaging results that were available during my care of the patient were reviewed by me and considered in my medical decision making (see chart for details).   Patient presents to the emergency department with nausea, vomiting, and generalized weakness for the past few days.  She has been on ciprofloxacin twice daily for the past 4 days for a UTI, some improvement in sxs but not resolved. Has had generalized pruritus as well. She is non toxic appearing, noted to be tachycardic, vitals otherwise without significant abnormality. She is noted to have lower abdominal tenderness w/o peritoneal signs.    CBC: No leukocytosis or anemia CMP: Significant  elevation in patients LFTs & alk phos, t bili is elevated as well. She is noted to have an AKI.  Lipase: WNL UA: Not overly consistent with UTI.  She does have notable hemoglobin, bilirubin, and protein in her urine.  Specific gravity is elevated consistent with dehydration.  CT switched to without contrast given patient's renal function.  CT abdomen pelvis without contrast: No acute abdominal/pelvic findings, mass lesions or adenopathy without contrast. Age advanced atherosclerotic calcifications involving the aorta and branch vessels including coronary artery calcifications. No renal, ureteral or bladder calculi or mass.   15:40: RE-EVAL: Patient resting comfortably, her tachycardia is improved.  States she takes Tylenol occasionally, but no significant amount.  She has had no sick contacts, recent travel, or fevers.  Will check tylenol level & hepatitis panel as well as RUQ [place].   16:00: Care signed out to [facility] PA-C at change of shift pending remaining labs and imaging.  Plan for admission.  Findings and plan of care discussed with supervising physician Dr. Laverta Baltimore who has evaluated patient & is in agreement.   Final Clinical Impressions(s) / ED Diagnoses   Final diagnoses:  LFT elevation  Non-intractable vomiting with nausea, unspecified vomiting type  AKI (acute kidney injury) [facility])    ED Discharge Orders    None       Amaryllis Dyke, PA-C 07/12/18 1600    Margette Fast, MD 07/12/18 Einar Crow

## 2018-07-12 NOTE — ED Provider Notes (Signed)
5:08 PM handoff from [name] at shift change.  Patient pending right upper quadrant ultrasounds.  History of chronic kidney disease.  Patient with 3 days of vomiting in setting of recent UTI and Cipro use.  Patient has marked elevation in her LFTs today with acute on chronic kidney injury likely due to vomiting.  Patient seen and evaluated.  Discussed results to this point.  She is in agreement with admission.  Discussed case with resident service who will see patient.   BP 125/71 (BP Location: Left Arm)   Pulse 98   Temp 98.7 F (37.1 C) (Oral)   Resp 18   Ht 5\' 5"  (1.651 m)   Wt 97.1 kg   SpO2 98%   BMI 35.61 kg/m     Carlisle Cater, PA-C 07/12/18 1709    Hayden Rasmussen, MD 07/13/18 [phone]

## 2018-07-12 NOTE — ED Notes (Signed)
Patient transported to CT 

## 2018-07-12 NOTE — Discharge Summary (Signed)
Name: Hayley Case MRN: [id number] DOB: May 07, 1950 68 y.o. PCP: Hayley Pounds, NP  Date of Admission: 07/12/2018 11:36 AM Date of Discharge: 07/14/3028 Attending Physician: Margette Fast, MD  Discharge Diagnosis: 1.  Intractable nausea and vomiting; ? Drug Induced Liver Injury  2.  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 Type 2 diabetes mellitus 4.  Hypertension  Discharge Medications: Allergies as of 07/15/2018      Reactions   Lisinopril Cough   Penicillins Hives, Itching   Did it involve swelling of the face/tongue/throat, SOB, or low BP? No Did it involve sudden or severe rash/hives, skin peeling, or any reaction on the inside of your mouth or nose? Yes Did you need to seek medical attention at a hospital or doctor's office? Yes When did it last happen?2012 If all above answers are "NO", may proceed with cephalosporin use.   Septra [sulfamethoxazole-trimethoprim] Hives      Medication List    STOP taking these medications   atorvastatin 40 MG tablet Commonly known as: LIPITOR   ciprofloxacin 500 MG tablet Commonly known as: Cipro   furosemide 20 MG tablet Commonly known as: LASIX     TAKE these medications   Accu-Chek Aviva Plus w/Device Kit Use as instructed   accu-chek soft touch lancets Use as instructed Check blood sugar fasting and at bedtime and some days after lunch   Lancets Thin Misc Use as instructed   acetaminophen 500 MG tablet Commonly known as: TYLENOL Take 500 mg by mouth every 4 (four) hours as needed for headache (pain).   allopurinol 100 MG tablet Commonly known as: ZYLOPRIM Take 1 tablet (100 mg total) by mouth daily. What changed: when to take this   amLODipine 10 MG tablet Commonly known as: NORVASC TAKE 1 TABLET(10 MG) BY MOUTH DAILY What changed:   how much to take  how to take this  when to take this  additional instructions   diphenhydrAMINE 25 MG tablet Commonly known as: BENADRYL Take 25 mg by mouth  every 6 (six) hours as needed for itching.   fluticasone 50 MCG/ACT nasal spray Commonly known as: FLONASE Place 2 sprays into both nostrils daily.   glucose blood test strip Commonly known as: ACCU-CHEK ACTIVE STRIPS Use as instructed   glucose blood test strip Use as instructed   Insulin Glargine 100 UNIT/ML Solostar Pen Commonly known as: Lantus SoloStar Inject 25 units into the skin nightly and 35 units into skin during the day What changed:   how much to take  how to take this  when to take this  additional instructions   Insulin Pen Needle 31G X 5 MM Misc Commonly known as: B-D UF III MINI PEN NEEDLES Use as directed. Check blood glucose levels by fingerstick twice a day.   loratadine 10 MG tablet Commonly known as: CLARITIN Take 10 mg by mouth daily as needed (seasonal allergies).   losartan 25 MG tablet Commonly known as: COZAAR Take 1 tablet (25 mg total) by mouth daily.   Medical Compression Stockings Misc Apply to both legs during the day.   metoprolol succinate 100 MG 24 hr tablet Commonly known as: TOPROL-XL TAKE 1 TABLET BY MOUTH DAILY. TAKE WITH OR IMMEDIATELY FOLLOWING A MEAL What changed:   how much to take  how to take this  when to take this  additional instructions   Misc. Devices Misc Please provide patient with insurance approved blood pressure monitor.   olopatadine 0.1 % ophthalmic solution Commonly  known as: PATANOL Place 1 drop into both eyes 2 (two) times daily.   REFRESH TEARS OP Place 1 drop into both eyes at bedtime.   VITAMIN C PO Take 1 tablet by mouth 2 (two) times a week.   Vitamin D (Ergocalciferol) 1.25 MG (50000 UT) Caps capsule Commonly known as: DRISDOL Take 1 capsule (50,000 Units total) by mouth every 7 (seven) days. What changed: when to take this       Disposition and follow-up:   Hayley Case was discharged from [facility] in stable condition.  At the hospital follow up  visit please address:  1.  Intractable nausea and vomiting; Hepatitis (unknown chronicity); abnormal liver functions: Differential diagnosis include autoimmune hepatitis versus primary biliary cholangitis versus nonalcoholic fatty liver disease versus hyperemesis induced liver injury versus hypoperfusion of hepatocytes.  2.  Labs / imaging needed at time of follow-up:  -CBC -CMP -Hepatic function panel -INR -PT -PTT  3.  Pending labs/ test needing follow-up: none  Follow-up Appointments:  Patient said she would make an appointment with her PCP. She was instructed to follow up within 1 to 2 weeks of discharge.  Hospital Course by problem list:  1.  Intractable nausea and vomiting; Presumed Drug Induced Liver Injury   Hayley Case is a 68 year old very pleasant African-American woman with essential hypertension, insulin-dependent diabetes mellitus, hyperlipidemia, chronic kidney disease stage III-IV who presented to hospital on July 12, 2018 with a 3-day history of intractable nausea and vomiting which she described as mucus/green in color.  She had also mentioned a remote history of pruritus after starting lisinopril.  On admission, she was noted to have significantly elevated transaminases AST (687<<89), ALT (914<<67), total bilirubin of 2.3, alkaline phosphatase of 524.  She has had a GGT a month prior to arrival and was elevated at 145., few transaminases revealed AST 695, ALT 922, alkaline phosphatase 529, total bilirubin 1.9, direct bilirubin 1, indirect bilirubin of 0.9, INR of 1, elevated LDH of 380.  CT abdomen and pelvis, ultrasound abdomen were unremarkable did not show any acute biliary abnormality.  We continue to provide supportive care and trended her liver function panel.  Our differential diagnosis for her included autoimmune hepatitis versus primary biliary cholangitis versus nonalcoholic fatty liver disease versus hyperemesis induced liver injury versus hypoperfusion of  hepatocytes. Abdominal CT and Liver u/s were both unremarkable. We obtained antimitochondrial antibody and anti-smooth antibodies which were both negative. We concluded the most likely culprit of the liver injury was likely due to medication.   2.  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  She received IV fluids and at discharge her creatinine was at her baseline (1.95)  3. Type 2 diabetes mellitus: Blood sugars were well controlled on the following regimen. We discharged her home with 60U daily as such: - Lantus 35U during the day - Lantus 25U at night  4.  Hypertension: Patient was taking amlodipine, metoprolol, and lisinopril for BP. The patient noted when she started taking the lisinopril, it made her itch. That was discontinued and the amlodipine and metoprolol was the regimen. Her BP were elevated in the 150s and 160s, so Losartan was added on for discharge. She was instructed to follow this up with her PCP. - Losartan 25 mg daily - Amlodipine 10 mg daily - Metoprolol 100 mg daily  Discharge Vitals:   BP 125/71 (BP Location: Left Arm)   Pulse 98   Temp 98.7 F (37.1 C) (Oral)   Resp 18   Ht '5\' 5"'  (1.651 m)   Wt 97.1 kg   SpO2 98%   BMI 35.61 kg/m   Pertinent Labs, Studies, and Procedures:   CT Abdomen Pelvis WO Contrast (6/29): IMPRESSION: 1. No acute abdominal/pelvic findings, mass lesions or adenopathy without contrast. 2. Age advanced atherosclerotic calcifications involving the aorta and branch vessels including coronary artery calcifications. 3. No renal, ureteral or bladder calculi or mass.  US ABDOMEN limited RUQ (6/29): IMPRESSION: Unremarkable right upper quadrant ultrasound.  Discharge Instructions:    Signed: Earlene Plater, MD 07/17/2018, 5:41 PM   Pager: [phone] Internal Medicine  Teaching Service

## 2018-07-12 NOTE — Progress Notes (Signed)
NEW ADMISSION NOTE New Admission Note:   Arrival Method: stretcher from ed Mental Orientation: alert and oriented x4 Telemetry: none  Assessment: Completed Skin: intact  IV: LAC infusing Pain: 0 Safety Measures: Safety Fall Prevention Plan has been given, discussed and signed Admission: Completed [facility] Orientation: Patient has been orientated to the room, unit and staff.  Family: NA  Orders have been reviewed and implemented. Will continue to monitor the patient. Call light has been placed within reach and bed alarm has been activated.   Baldo Ash, RN

## 2018-07-12 NOTE — ED Triage Notes (Signed)
Pt recently diagnosed with a UTI and today is her last day of antibiotics. Pt recently had her son start dialysis at home and she has been stressed. Pt states she has been vomiting several times today.

## 2018-07-12 NOTE — H&P (Addendum)
Date: 07/12/2018               Patient Name:  Hayley Case MRN: [id number]  DOB: 1950/12/09 Age / Sex: 68 y.o., female   PCP: Gildardo Pounds, NP         Medical Service: Internal Medicine Teaching Service         Attending Physician: Dr. Evette Doffing     First Contact: Dr. Eileen Stanford Pager: [phone]  Second Contact: Dr. Berline Lopes  Pager: [phone]       After Hours (After 5p/  First Contact Pager: [phone]  weekends / holidays): Second Contact Pager: [phone]   Chief Complaint: Emesis  History of Present Illness: Hayley Case is a 68 year old woman with essential hypertension, insulin-dependent diabetes mellitus, hyperlipidemia, chronic kidney disease stage III-IV presenting for evaluation of emesis.  She was in her usual state of health until 3 days ago when she began experiencing emesis which she describes as mucus/green in color, sometimes postprandial in nature however unrelated to diet.  Since onset, she has been experiencing dizziness, lightheadedness, fatigue and weakness and has had decreased p.o. intake.  She denies fevers, chills, abdominal pain or any prior episodes of severe emesis.  She states that about 3 weeks ago she had worsening back pain and was taking 500 mg Tylenol every 4 hours.  She denies any use of over-the-counter medications with exception of vitamin D, biotin.  She does report of chronic constipation and states her last bowel movement was 4 days ago.  She has been however passing flatus.  She was found to have elevated liver enzymes on May 18, 2018 with elevated GGT.  This was followed with a scheduled abdominal ultrasound was unremarkable. She had follow-up appointment about a month later and complained of pruritus after she was started on lisinopril and this was thought to be secondary to her antihypertensive.  Currently, she states her nausea and vomiting has improved/resolved with the antiemetics that was provided to her in the ED.  ED course: Afebrile, BP 132/76,  HR 90s-110s, SPO2 100% on room air, Tylenol level unremarkable, CBC unremarkable, CMP showed hyperglycemia of 298, BUN/creatinine 28/2.7, low albumin of 3.2, AST 687, ALT 914, alk phos 524, total bilirubin 2.3. Urinalysis shows elevated specific gravity, glucosuria, moderate hemoglobinuria, moderate bilirubin, proteinuria, trace leukocytes,, few bacteria, lipase unremarkable.  Abdominal ultrasound unremarkable, CT abdomen and pelvis with no acute liver or biliary process, she received 1 L NS bolus and Zofran.   Meds:  No current facility-administered medications on file prior to encounter.    Current Outpatient Medications on File Prior to Encounter  Medication Sig Dispense Refill  . allopurinol (ZYLOPRIM) 100 MG tablet Take 1 tablet (100 mg total) by mouth daily. 90 tablet 0  . amLODipine (NORVASC) 10 MG tablet TAKE 1 TABLET(10 MG) BY MOUTH DAILY 90 tablet 0  . atorvastatin (LIPITOR) 40 MG tablet TAKE 1 TABLET(40 MG) BY MOUTH DAILY 90 tablet 1  . Blood Glucose Monitoring Suppl (ACCU-CHEK AVIVA PLUS) w/Device KIT Use as instructed 1 kit 0  . Carboxymethylcellulose Sodium (REFRESH TEARS OP) Apply to eye.    . ciprofloxacin (CIPRO) 500 MG tablet Take 1 tablet (500 mg total) by mouth 2 (two) times daily for 5 days. 10 tablet 0  . Elastic Bandages & Supports (MEDICAL COMPRESSION STOCKINGS) MISC Apply to both legs during the day. (Patient not taking: Reported on 07/06/2018) 1 each 0  . fluticasone (FLONASE) 50 MCG/ACT nasal spray Place 2 sprays into both nostrils  daily. (Patient not taking: Reported on 11/13/2017) 16 g 6  . furosemide (LASIX) 20 MG tablet Take 1 tablet (20 mg total) by mouth daily as needed for up to 30 days for edema. (Patient not taking: Reported on 07/06/2018) 15 tablet 0  . glucose blood (ACCU-CHEK ACTIVE STRIPS) test strip Use as instructed 100 each 12  . glucose blood test strip Use as instructed (Patient not taking: Reported on 11/13/2017) 100 each 12  . Insulin Glargine (LANTUS  SOLOSTAR) 100 UNIT/ML Solostar Pen Inject 25 units into the skin nightly and 35 units into skin during the day 90 mL 3  . Insulin Pen Needle (B-D UF III MINI PEN NEEDLES) 31G X 5 MM MISC Use as directed. Check blood glucose levels by fingerstick twice a day. 100 each 5  . Ketotifen Fumarate (EYE ITCH RELIEF OP) Apply to eye.    . Lancets (ACCU-CHEK SOFT TOUCH) lancets Use as instructed Check blood sugar fasting and at bedtime and some days after lunch 100 each 12  . Lancets Thin MISC Use as instructed (Patient not taking: Reported on 11/13/2017) 100 each 3  . lisinopril (ZESTRIL) 5 MG tablet Take 5 mg by mouth daily.    . metoprolol succinate (TOPROL-XL) 100 MG 24 hr tablet TAKE 1 TABLET BY MOUTH DAILY. TAKE WITH OR IMMEDIATELY FOLLOWING A MEAL 90 tablet 0  . mirabegron ER (MYRBETRIQ) 50 MG TB24 tablet Take 50 mg by mouth daily.    . Misc. Devices MISC Please provide patient with insurance approved blood pressure monitor. 1 each 0  . olopatadine (PATANOL) 0.1 % ophthalmic solution Place 1 drop into both eyes 2 (two) times daily. (Patient not taking: Reported on 07/06/2018) 5 mL 12  . Vitamin D, Ergocalciferol, (DRISDOL) 1.25 MG (50000 UT) CAPS capsule Take 1 capsule (50,000 Units total) by mouth every 7 (seven) days. 12 capsule 1     Allergies: Allergies as of 07/12/2018 - Review Complete 07/12/2018  Allergen Reaction Noted  . Lisinopril Cough 04/26/2018  . Penicillins Hives and Itching 10/03/2016  . Septra [sulfamethoxazole-trimethoprim] Hives 10/03/2016   Past Medical History:  Diagnosis Date  . CKD (chronic kidney disease)   . Diabetes mellitus without complication ([place])   . Hyperlipidemia   . Hypertension     Family History: Father passed of myocardial infarction, some passed at a very young age with "heart attack."  Social History: Recently moved from [place] to live with her son who is a Tax adviser at [employer] who was recently started on hemodialysis  which has been causing her a lot of stress.  She has total of 16 siblings.  She denies EtOH, cigarette or illicit drug use.  She belongs to Jehov[employer] witness denomination and refuses blood products.  Review of Systems: A complete ROS was negative except as per HPI.   Physical Exam: Blood pressure (!) 158/95, pulse (!) 112, temperature 98.5 F (36.9 C), temperature source Oral, resp. rate 18, height '5\' 5"'  (1.651 m), weight 97.1 kg, SpO2 100 %. Physical Exam Vitals signs and nursing note reviewed.  Constitutional:      General: She is not in acute distress.    Appearance: She is obese. She is not toxic-appearing or diaphoretic.  HENT:     Head: Normocephalic and atraumatic.  Eyes:     Conjunctiva/sclera: Conjunctivae normal.  Neck:     Musculoskeletal: Normal range of motion and neck supple.  Cardiovascular:     Rate and Rhythm: Regular rhythm. Tachycardia present.  Heart sounds: Normal heart sounds. No murmur.  Pulmonary:     Effort: Pulmonary effort is normal.     Breath sounds: Normal breath sounds. No wheezing, rhonchi or rales.  Abdominal:     General: Bowel sounds are normal.     Tenderness: There is no abdominal tenderness. There is no right CVA tenderness, left CVA tenderness, guarding or rebound.  Musculoskeletal:        General: No deformity.     Right lower leg: No edema.     Left lower leg: No edema.  Neurological:     General: No focal deficit present.     Mental Status: She is alert and oriented to person, place, and time. Mental status is at baseline.     Cranial Nerves: No cranial nerve deficit.     Motor: No weakness.  Psychiatric:        Mood and Affect: Mood normal.        Behavior: Behavior normal.     Assessment & Plan by Problem: Active Problems:   Acute on chronic renal insufficiency   Ms. Osias is a 68 year old woman with essential hypertension, insulin-dependent diabetes mellitus, hyperlipidemia, chronic kidney disease stage III-IV presenting  for evaluation of emesis.  Intractable nausea and vomiting Acute on chronic liver injury Abnormal liver function Presents with 3-day history of intractable nausea and vomiting which she describes as mucus/green and remote history of pruritis.  Typically not related to diet but does report some episodes of postprandial emesis with no abdominal pain, fevers or chills.  Noted to have significantly elevated transaminase AST (687<<89), ALT (914<<167), total bilirubin 2.3 currently pending differentiation, alkaline phosphatase of 524.  She was also found to have elevated GGT but a month ago of 145.  Acute hepatitis panel also unremarkable 1 month ago.  Also she denies any history of IV drug use, blood transfusion or donation again acute hepatitis less likely.  CT abdomen and pelvis, abdominal ultrasound performed in the ED were unremarkable.  Urinalysis evident for bilirubin, microscopy reveals amorphous urate and phosphates.  Currently she is alert and oriented x4 with no evidence of hepatic encephalopathy current working theory include autoimmune hepatitis vs primary biliary sclerosis vs nonalcoholic fatty liver disease vs hyperemesis induced liver injury vs hypoperfusion of hepatocytes. We will continue supportive care.  - Follow-up LFT q12 hours, goal INR <1.5 - Monitor neurological function - Follow BMP - Follow-up LDH - Zofran prn nausea  #Acute on chronic CKD stage III-IV: She has a component of prerenal azotemia.  Serum creatinine this admission of 2.7 (baseline 1.8-2.3).  Status post normal saline bolus - Continue maintenance IV fluids with NS at 125 mL/hour - Follow up BMP  #Type 2 diabetes mellitus - Continue Lantus 20 units qAM, 35 units qhs, SSI w/ meals   #Hypertension - Continue metoprolol succinate 100 mg daily amlodipine 10 mg daily  FEN: Replace electrolytes as needed, clear liquid diet VTE ppx: Subcutaneous heparin CODE STATUS: DNR  Dispo: Admit patient to Inpatient with  expected length of stay greater than 2 midnights.  Signed: Jean Rosenthal, MD 07/12/2018, 6:28 PM  Pager: [phone] Internal Medicine Teaching Service

## 2018-07-12 NOTE — ED Notes (Signed)
ED TO INPATIENT HANDOFF REPORT  ED Nurse Name and Phone #: Sherrine Maples [phone]  S Name/Age/Gender Hayley Case 68 y.o. female Room/Bed: 028C/028C  Code Status   Code Status: Full Code  Home/SNF/Other Home Patient oriented to: self, place, time and situation Is this baseline? Yes   Triage Complete: Triage complete  Chief Complaint vomiting/gen weakness  Triage Note Pt recently diagnosed with a UTI and today is her last day of antibiotics. Pt recently had her son start dialysis at home and she has been stressed. Pt states she has been vomiting several times today.    Allergies Allergies  Allergen Reactions  . Lisinopril Cough  . Penicillins Hives  . Septra [Sulfamethoxazole-Trimethoprim] Hives    Level of Care/Admitting Diagnosis ED Disposition    ED Disposition Condition [facility]: [place] [[id number]]  Level of Care: Med-Surg [16]  Covid Evaluation: Screening Protocol (No Symptoms)  Diagnosis: Acute on chronic renal insufficiency [287681]  Admitting Physician: Axel Filler [[id number]]  Attending Physician: Axel Filler [[id number]]  Estimated length of stay: past midnight tomorrow  Certification:: I certify this patient will need inpatient services for at least 2 midnights  PT Class (Do Not Modify): Inpatient [101]  PT Acc Code (Do Not Modify): Private [1]       B Medical/Surgery History Past Medical History:  Diagnosis Date  . CKD (chronic kidney disease)   . Diabetes mellitus without complication ([facility])   . Hyperlipidemia   . Hypertension    Past Surgical History:  Procedure Laterality Date  . BREAST BIOPSY    . BREAST EXCISIONAL BIOPSY Left      A IV Location/Drains/Wounds Patient Lines/Drains/Airways Status   Active Line/Drains/Airways    Name:   Placement date:   Placement time:   Site:   Days:   Peripheral IV 07/12/18 Left Antecubital   07/12/18    1235    Antecubital   less than 1           Intake/Output Last 24 hours No intake or output data in the 24 hours ending 07/12/18 1735  Labs/Imaging Results for orders placed or performed during the hospital encounter of 07/12/18 (from the past 48 hour(s))  Urinalysis, Routine w reflex microscopic     Status: Abnormal   Collection Time: 07/12/18 12:14 PM  Result Value Ref Range   Color, Urine YELLOW YELLOW   APPearance HAZY (A) CLEAR   Specific Gravity, Urine >1.030 (H) 1.005 - 1.030   pH 5.0 5.0 - 8.0   Glucose, UA 250 (A) NEGATIVE mg/dL   Hgb urine dipstick MODERATE (A) NEGATIVE   Bilirubin Urine MODERATE (A) NEGATIVE   Ketones, ur NEGATIVE NEGATIVE mg/dL   Protein, ur >300 (A) NEGATIVE mg/dL   Nitrite NEGATIVE NEGATIVE   Leukocytes,Ua TRACE (A) NEGATIVE    Comment: Performed at [facility], [address], [place], [place] [address]  Urinalysis, Microscopic (reflex)     Status: Abnormal   Collection Time: 07/12/18 12:14 PM  Result Value Ref Range   RBC / HPF 0-5 0 - 5 RBC/hpf   WBC, UA 0-5 0 - 5 WBC/hpf   Bacteria, UA FEW (A) NONE SEEN   Squamous Epithelial / LPF 0-5 0 - 5   Urine-Other AMORPHOUS URATES/PHOSPHATES     Comment: Performed at [facility], [place] [address] [place], [place] [address]  CBC with Differential     Status: None   Collection Time: 07/12/18 12:49  PM  Result Value Ref Range   WBC 6.9 4.0 - 10.5 K/uL   RBC 4.80 3.87 - 5.11 MIL/uL   Hemoglobin 13.6 12.0 - 15.0 g/dL   HCT 41.4 36.0 - 46.0 %   MCV 86.3 80.0 - 100.0 fL   MCH 28.3 26.0 - 34.0 pg   MCHC 32.9 30.0 - 36.0 g/dL   RDW 14.0 11.5 - 15.5 %   Platelets 271 150 - 400 K/uL   nRBC 0.0 0.0 - 0.2 %   Neutrophils Relative % 72 %   Neutro Abs 5.0 1.7 - 7.7 K/uL   Lymphocytes Relative 17 %   Lymphs Abs 1.2 0.7 - 4.0 K/uL   Monocytes Relative 9 %   Monocytes Absolute 0.6 0.1 - 1.0 K/uL   Eosinophils Relative 1 %   Eosinophils Absolute 0.0 0.0 - 0.5 K/uL   Basophils Relative 1 %   Basophils Absolute 0.0 0.0 - 0.1 K/uL    Immature Granulocytes 0 %   Abs Immature Granulocytes 0.01 0.00 - 0.07 K/uL    Comment: Performed at [facility], [address] [place], [place] [address]  Comprehensive metabolic panel     Status: Abnormal   Collection Time: 07/12/18 12:49 PM  Result Value Ref Range   Sodium 134 (L) 135 - 145 mmol/L   Potassium 4.4 3.5 - 5.1 mmol/L   Chloride 99 98 - 111 mmol/L   CO2 20 (L) 22 - 32 mmol/L   Glucose, Bld 298 (H) 70 - 99 mg/dL   BUN 28 (H) 8 - 23 mg/dL   Creatinine, Ser 2.73 (H) 0.44 - 1.00 mg/dL   Calcium 9.8 8.9 - 10.3 mg/dL   Total Protein 8.2 (H) 6.5 - 8.1 g/dL   Albumin 3.2 (L) 3.5 - 5.0 g/dL   AST 687 (H) 15 - 41 U/L   ALT 914 (H) 0 - 44 U/L   Alkaline Phosphatase 524 (H) 38 - 126 U/L   Total Bilirubin 2.3 (H) 0.3 - 1.2 mg/dL   GFR calc non Af Amer 17 (L) >60 mL/min   GFR calc Af Amer 20 (L) >60 mL/min   Anion gap 15 5 - 15    Comment: Performed at [facility], [place] [address], [place], [place] [address]  Lipase, blood     Status: None   Collection Time: 07/12/18 12:49 PM  Result Value Ref Range   Lipase 46 11 - 51 U/L    Comment: Performed at [facility] [address], [place], [place] [address]  Acetaminophen level     Status: Abnormal   Collection Time: 07/12/18  4:48 PM  Result Value Ref Range   Acetaminophen (Tylenol), Serum <10 (L) 10 - 30 ug/mL    Comment: (NOTE) Therapeutic concentrations vary significantly. A range of 10-30 ug/mL  may be an effective concentration for many patients. However, some  are best treated at concentrations outside of this range. Acetaminophen concentrations >150 ug/mL at 4 hours after ingestion  and >50 ug/mL at 12 hours after ingestion are often associated with  toxic reactions. Performed at [facility], [place] [address], [place], [place] [address]    Ct Abdomen Pelvis Wo Contrast  Result Date: 07/12/2018 CLINICAL DATA:  Lower abdominal pain for 10 days. UTI. Nausea and vomiting. EXAM: CT ABDOMEN AND  PELVIS WITHOUT CONTRAST TECHNIQUE: Multidetector CT imaging of the abdomen and pelvis was performed following the standard protocol without IV contrast. COMPARISON:  None. FINDINGS: Lower  chest: The lung bases are clear of acute process. No pleural effusion or pulmonary lesions. The heart is normal in size. No pericardial effusion. Aortic and coronary artery calcifications are noted. The distal esophagus and aorta are unremarkable. Small hiatal hernia. Hepatobiliary: No focal hepatic lesions or intrahepatic biliary dilatation. The gallbladder is unremarkable. No common bile duct dilatation. Pancreas: No mass, inflammation or ductal dilatation. Spleen: Normal size.  No focal lesions. Adrenals/Urinary Tract: The adrenal glands and kidneys are unremarkable. No renal, ureteral or bladder calculi or mass is identified without contrast. Stomach/Bowel: The stomach, duodenum, small bowel and colon are grossly normal without oral contrast. No inflammatory changes, mass lesions or obstructive findings. The terminal ileum and appendix are normal. Vascular/Lymphatic: Moderate to advanced atherosclerotic calcifications for age but no aneurysm. No mesenteric or retroperitoneal mass or adenopathy. Reproductive: The uterus and ovaries are unremarkable. Other: No pelvic mass or adenopathy. No free pelvic fluid collections. No inguinal mass or adenopathy. No abdominal wall hernia or subcutaneous lesions. Musculoskeletal: Musculoskeletal no significant bony findings. IMPRESSION: 1. No acute abdominal/pelvic findings, mass lesions or adenopathy without contrast. 2. Age advanced atherosclerotic calcifications involving the aorta and branch vessels including coronary artery calcifications. 3. No renal, ureteral or bladder calculi or mass. Electronically Signed   By: Marijo Sanes M.D.   On: 07/12/2018 15:11   US Abdomen Limited Ruq  Result Date: 07/12/2018 CLINICAL DATA:  Elevated LFTs EXAM: ULTRASOUND ABDOMEN LIMITED RIGHT UPPER  QUADRANT COMPARISON:  CT from earlier in the same day. FINDINGS: Gallbladder: No gallstones or wall thickening visualized. No sonographic Murphy sign noted by sonographer. Common bile duct: Diameter: 4.2 mm. Liver: No focal lesion identified. Within normal limits in parenchymal echogenicity. Portal vein is patent on color Doppler imaging with normal direction of blood flow towards the liver. IMPRESSION: Unremarkable right upper quadrant ultrasound. Electronically Signed   By: Inez Catalina M.D.   On: 07/12/2018 16:01    Pending Labs Unresulted Labs (From admission, onward)    Start     Ordered   07/12/18 1709  HIV antibody (Routine Testing)  Once,   STAT     07/12/18 1710   07/12/18 1640  SARS Coronavirus 2 (CEPHEID - Performed in [facility] lab), Hosp Order  (Asymptomatic Patients Labs)  Once,   STAT    Question:  Rule Out  Answer:  Yes   07/12/18 1639   07/12/18 1158  Urine culture  ONCE - STAT,   STAT     07/12/18 1157          Vitals/Pain Today's Vitals   07/12/18 1400 07/12/18 1407 07/12/18 1733 07/12/18 1735  BP: 125/71 125/71 (!) 145/85 (!) 145/85  Pulse: 97 98  97  Resp:  18  18  Temp:      TempSrc:      SpO2: 98% 98%  98%  Weight:      Height:      PainSc:        Isolation Precautions No active isolations  Medications Medications  0.9 %  sodium chloride infusion (has no administration in time range)  heparin injection 5,000 Units (has no administration in time range)  amLODipine (NORVASC) tablet 10 mg (has no administration in time range)  allopurinol (ZYLOPRIM) tablet 100 mg (has no administration in time range)  metoprolol succinate (TOPROL-XL) 24 hr tablet 100 mg (has no administration in time range)  insulin glargine (LANTUS) injection 20 Units (has no administration in time range)    Or  insulin  glargine (LANTUS) injection 35 Units (has no administration in time range)  insulin aspart (novoLOG) injection 0-15 Units (has no administration in time  range)  sodium chloride 0.9 % bolus 1,000 mL (0 mLs Intravenous Stopped 07/12/18 1315)  ondansetron (ZOFRAN) injection 4 mg (4 mg Intravenous Given 07/12/18 1235)    Mobility walks Low fall risk   Focused Assessments    R Recommendations: See Admitting Provider Note  Report given to:   Additional Notes:

## 2018-07-13 DIAGNOSIS — E785 Hyperlipidemia, unspecified: Secondary | ICD-10-CM

## 2018-07-13 DIAGNOSIS — E860 Dehydration: Secondary | ICD-10-CM

## 2018-07-13 DIAGNOSIS — N183 Chronic kidney disease, stage 3 (moderate): Secondary | ICD-10-CM

## 2018-07-13 DIAGNOSIS — Z87440 Personal history of urinary (tract) infections: Secondary | ICD-10-CM

## 2018-07-13 DIAGNOSIS — B179 Acute viral hepatitis, unspecified: Secondary | ICD-10-CM | POA: Diagnosis present

## 2018-07-13 DIAGNOSIS — E1122 Type 2 diabetes mellitus with diabetic chronic kidney disease: Secondary | ICD-10-CM

## 2018-07-13 DIAGNOSIS — Z794 Long term (current) use of insulin: Secondary | ICD-10-CM

## 2018-07-13 DIAGNOSIS — N179 Acute kidney failure, unspecified: Secondary | ICD-10-CM

## 2018-07-13 DIAGNOSIS — I129 Hypertensive chronic kidney disease with stage 1 through stage 4 chronic kidney disease, or unspecified chronic kidney disease: Secondary | ICD-10-CM

## 2018-07-13 DIAGNOSIS — R740 Nonspecific elevation of levels of transaminase and lactic acid dehydrogenase [LDH]: Secondary | ICD-10-CM

## 2018-07-13 DIAGNOSIS — Z79899 Other long term (current) drug therapy: Secondary | ICD-10-CM

## 2018-07-13 LAB — BASIC METABOLIC PANEL
Anion gap: 7 (ref 5–15)
BUN: 24 mg/dL — ABNORMAL HIGH (ref 8–23)
CO2: 22 mmol/L (ref 22–32)
Calcium: 8.7 mg/dL — ABNORMAL LOW (ref 8.9–10.3)
Chloride: 104 mmol/L (ref 98–111)
Creatinine, Ser: 2.79 mg/dL — ABNORMAL HIGH (ref 0.44–1.00)
GFR calc Af Amer: 20 mL/min — ABNORMAL LOW (ref >60)
GFR calc non Af Amer: 17 mL/min — ABNORMAL LOW (ref >60)
Glucose, Bld: 326 mg/dL — ABNORMAL HIGH (ref 70–99)
Potassium: 4.3 mmol/L (ref 3.5–5.1)
Sodium: 133 mmol/L — ABNORMAL LOW (ref 135–145)

## 2018-07-13 LAB — HEPATIC FUNCTION PANEL
ALT: 645 U/L — ABNORMAL HIGH (ref 0–44)
ALT: 665 U/L — ABNORMAL HIGH (ref 0–44)
AST: 480 U/L — ABNORMAL HIGH (ref 15–41)
AST: 469 U/L — ABNORMAL HIGH (ref 15–41)
Albumin: 2.6 g/dL — ABNORMAL LOW (ref 3.5–5.0)
Albumin: 2.7 g/dL — ABNORMAL LOW (ref 3.5–5.0)
Alkaline Phosphatase: 364 U/L — ABNORMAL HIGH (ref 38–126)
Alkaline Phosphatase: 345 U/L — ABNORMAL HIGH (ref 38–126)
Bilirubin, Direct: 0.6 mg/dL — ABNORMAL HIGH (ref 0.0–0.2)
Bilirubin, Direct: 0.6 mg/dL — ABNORMAL HIGH (ref 0.0–0.2)
Indirect Bilirubin: 0.9 mg/dL (ref 0.3–0.9)
Indirect Bilirubin: 0.9 mg/dL (ref 0.3–0.9)
Total Bilirubin: 1.5 mg/dL — ABNORMAL HIGH (ref 0.3–1.2)
Total Bilirubin: 1.5 mg/dL — ABNORMAL HIGH (ref 0.3–1.2)
Total Protein: 6.3 g/dL — ABNORMAL LOW (ref 6.5–8.1)
Total Protein: 6.2 g/dL — ABNORMAL LOW (ref 6.5–8.1)

## 2018-07-13 LAB — URINE CULTURE: Culture: NO GROWTH

## 2018-07-13 LAB — CBC
HCT: 32.7 % — ABNORMAL LOW (ref 36.0–46.0)
Hemoglobin: 11.1 g/dL — ABNORMAL LOW (ref 12.0–15.0)
MCH: 29.3 pg (ref 26.0–34.0)
MCHC: 33.9 g/dL (ref 30.0–36.0)
MCV: 86.3 fL (ref 80.0–100.0)
Platelets: 216 10*3/uL (ref 150–400)
RBC: 3.79 MIL/uL — ABNORMAL LOW (ref 3.87–5.11)
RDW: 14.6 % (ref 11.5–15.5)
WBC: 6.2 10*3/uL (ref 4.0–10.5)
nRBC: 0 % (ref 0.0–0.2)

## 2018-07-13 LAB — GLUCOSE, CAPILLARY
Glucose-Capillary: 305 mg/dL — ABNORMAL HIGH (ref 70–99)
Glucose-Capillary: 304 mg/dL — ABNORMAL HIGH (ref 70–99)
Glucose-Capillary: 230 mg/dL — ABNORMAL HIGH (ref 70–99)
Glucose-Capillary: 273 mg/dL — ABNORMAL HIGH (ref 70–99)

## 2018-07-13 LAB — HIV ANTIBODY (ROUTINE TESTING W REFLEX): HIV Screen 4th Generation wRfx: NONREACTIVE

## 2018-07-13 LAB — SAVE SMEAR(SSMR), FOR PROVIDER SLIDE REVIEW

## 2018-07-13 LAB — PATHOLOGIST SMEAR REVIEW: Path Review: 6302020

## 2018-07-13 LAB — CK: Total CK: 108 U/L (ref 38–234)

## 2018-07-13 MED ORDER — HYDRALAZINE HCL 20 MG/ML IJ SOLN: 5.0000 mg | Freq: Four times a day (QID) | INTRAMUSCULAR | Status: DC | PRN

## 2018-07-13 MED ORDER — SODIUM CHLORIDE 0.9 % IV SOLN
INTRAVENOUS | Status: AC
  Administered 2018-07-13: 12:00:00 via INTRAVENOUS

## 2018-07-13 NOTE — Plan of Care (Signed)
  Problem: Activity: Goal: Risk for activity intolerance will decrease Outcome: Progressing   

## 2018-07-13 NOTE — Progress Notes (Signed)
Inpatient Diabetes Program Recommendations  AACE/ADA: New Consensus Statement on Inpatient Glycemic Control (2015)  Target Ranges:  Prepandial:   less than 140 mg/dL      Peak postprandial:   less than 180 mg/dL (1-2 hours)      Critically ill patients:  140 - 180 mg/dL   Lab Results  Component Value Date   GLUCAP 304 (H) 07/13/2018   HGBA1C 10.1 (H) 05/18/2018    Review of Glycemic Control Results for BLANCH, STANG (MRN [id number]) as of 07/13/2018 14:57  Ref. Range 07/12/2018 18:05 07/12/2018 20:40 07/13/2018 06:45 07/13/2018 11:40  Glucose-Capillary Latest Ref Range: 70 - 99 mg/dL 259 (H) 212 (H) 305 (H) 304 (H)   Diabetes history: Type 2 DM Outpatient Diabetes medications: Lantus 25-35 units QHS, 45 units QAM Current orders for Inpatient glycemic control: Lantus 35 units QHS, 20 units QD, Novolog 0-15 units TID  Inpatient Diabetes Program Recommendations:    Noted blood sugars elevated and exceeding 300's mg/dL. Patient did not received QHS dose of Lantus yesterday, so assuming this may be related. Will reassess needs in AM.   Spoke with patient related to outpatient DM management. Verified home doses and confirmed that patient is taking. Expresses concern about current blood sugars. Reviewed patient's current A1c of 10.1%. Explained what a A1c is and what it measures. Also reviewed goal A1c with patient, importance of good glucose control @ home, and blood sugar goals. Reviewed patho of DM, need for insulin, how stress may impact glucose, importance of checking blood sugars, vascular changes and comorbidites.  Patient will need a blood glucose meter at discharge. Blood glucose meter kit (includes lancets and test strips) ([id number]). Patient has been limiting blood sugar checks due to finances. Spoke with patient about importance of checking atleast 2 times per day. Included SHIP information on DC summary and encouraged her to apply. Patient admits to drinking sweet tea. Discussed  alternatives and encouraged patient to be mindful of carb intake. Dietitian consult placed. No further questions at this time.   Thanks, Bronson Curb, MSN, RNC-OB Diabetes Coordinator [phone] (8a-5p)

## 2018-07-13 NOTE — Progress Notes (Signed)
   Subjective: HD#1   Overnight:  Today, Hayley Case was examined and evaluated at bedside this AM. She mentions feeling better. She states she had some coughing with meals but no nausea or vomiting. She states she was expecting meat and potatoes. Explained that we are progressing her diet slowly. She mentions that she has never had any liver problems in the past. She denies any history of abdominal pain induced by fatty foods. She states she takes benadryl, claritin and vitamin D but no other supplements or vitamins. She mentions recent hx of blood in her urine but was told it was a urinary tract infection and was treated with 5 day course of ciprofloxacin last Tuesday. She also mentions undergoing dental surgery requiring 'strong' antibiotics couple months ago. She mentions that she does not feel altered or 'foggy.'   Objective:  Vital signs in last 24 hours: Vitals:   07/12/18 1735 07/12/18 1803 07/12/18 2038 07/13/18 0541  BP: (!) 145/85 (!) 158/95 137/82 (!) 155/104  Pulse: 97 (!) 112 (!) 110 87  Resp: 18 18 20 20   Temp:  98.5 F (36.9 C) 98.4 F (36.9 C) 98.2 F (36.8 C)  TempSrc:  Oral Oral Oral  SpO2: 98% 100% 100% 100%  Weight:   95.8 kg   Height:       Const: In no apparent distress, lying comfortably in bed, conversational Abd: Bowel sounds present, nondistended, nontender to palpation  Assessment/Plan:  Active Problems:   Acute on chronic renal insufficiency   Acute hepatitis   Hayley Case is a 68 year old woman with essential hypertension, insulin-dependent diabetes mellitus, hyperlipidemia, chronic kidney disease stage III-IV presenting for evaluation of emesis.  Intractable nausea and vomiting Probable drug-induced liver injury Rule out autoimmune hepatitis  Symptomatically she has improved as she denies any subsequent episodes of nausea or vomiting.  On physical exam she has no evidence of encephalopathy. Objectively, her liver function panel is trending  down.  Our current working theory is drug-induced liver injury with possible culprits including ciprofloxacin, lisinopril, statin.  We are also exploring possible hepatitis B and will order hepatitis B surface antigen, surface antibody and total core antibody.  In addition, we are checking for possible rhabdomyolysis as urinalysis showed moderate hemoglobin with crystals - Follow-up LFT q12 hours, goal INR <1.5 - Monitor neurological function - Follow CMP in the am  - Follow-up HepB sAg, HepB sAb, HepB Total Core Ab, anti-Smith muscle antibody, antimitochondrial antibody - Follow-up CK - Zofran prn nausea  #Acute on chronic CKD stage III-IV: sCr 2.7 (Baseline ~1.8-2.2) - Continue maintenance IV fluids with NS at 125 mL/hour - Follow up BMP  #Type 2 diabetes mellitus - Continue Lantus 20 units qAM, 35 units qhs, SSI w/ meals   #Hypertension - Continue metoprolol succinate 100 mg daily amlodipine 10 mg daily - Hydralazine prn   Hayley Rosenthal, MD 07/13/2018, 6:04 AM Pager: [phone] Internal Medicine Teaching Service

## 2018-07-14 DIAGNOSIS — R1110 Vomiting, unspecified: Secondary | ICD-10-CM

## 2018-07-14 LAB — COMPREHENSIVE METABOLIC PANEL
ALT: 628 U/L — ABNORMAL HIGH (ref 0–44)
AST: 496 U/L — ABNORMAL HIGH (ref 15–41)
Albumin: 2.6 g/dL — ABNORMAL LOW (ref 3.5–5.0)
Alkaline Phosphatase: 395 U/L — ABNORMAL HIGH (ref 38–126)
Anion gap: 11 (ref 5–15)
BUN: 25 mg/dL — ABNORMAL HIGH (ref 8–23)
CO2: 22 mmol/L (ref 22–32)
Calcium: 8.7 mg/dL — ABNORMAL LOW (ref 8.9–10.3)
Chloride: 103 mmol/L (ref 98–111)
Creatinine, Ser: 2.32 mg/dL — ABNORMAL HIGH (ref 0.44–1.00)
GFR calc Af Amer: 24 mL/min — ABNORMAL LOW (ref >60)
GFR calc non Af Amer: 21 mL/min — ABNORMAL LOW (ref >60)
Glucose, Bld: 326 mg/dL — ABNORMAL HIGH (ref 70–99)
Potassium: 4.5 mmol/L (ref 3.5–5.1)
Sodium: 136 mmol/L (ref 135–145)
Total Bilirubin: 1.2 mg/dL (ref 0.3–1.2)
Total Protein: 6.1 g/dL — ABNORMAL LOW (ref 6.5–8.1)

## 2018-07-14 LAB — CBC
HCT: 34.2 % — ABNORMAL LOW (ref 36.0–46.0)
Hemoglobin: 11.3 g/dL — ABNORMAL LOW (ref 12.0–15.0)
MCH: 29 pg (ref 26.0–34.0)
MCHC: 33 g/dL (ref 30.0–36.0)
MCV: 87.7 fL (ref 80.0–100.0)
Platelets: 189 10*3/uL (ref 150–400)
RBC: 3.9 MIL/uL (ref 3.87–5.11)
RDW: 14.6 % (ref 11.5–15.5)
WBC: 4.8 10*3/uL (ref 4.0–10.5)
nRBC: 0 % (ref 0.0–0.2)

## 2018-07-14 LAB — GLUCOSE, CAPILLARY
Glucose-Capillary: 292 mg/dL — ABNORMAL HIGH (ref 70–99)
Glucose-Capillary: 292 mg/dL — ABNORMAL HIGH (ref 70–99)
Glucose-Capillary: 287 mg/dL — ABNORMAL HIGH (ref 70–99)
Glucose-Capillary: 345 mg/dL — ABNORMAL HIGH (ref 70–99)

## 2018-07-14 LAB — MITOCHONDRIAL ANTIBODIES: Mitochondrial M2 Ab, IgG: <20 Units (ref 0.0–20.0)

## 2018-07-14 LAB — ANTI-SMOOTH MUSCLE ANTIBODY, IGG: F-Actin IgG: 19 Units (ref 0–19)

## 2018-07-14 LAB — HEPATITIS B CORE ANTIBODY, TOTAL: Hep B Core Total Ab: NEGATIVE

## 2018-07-14 LAB — HEPATITIS B SURFACE ANTIBODY, QUANTITATIVE: Hep B S AB Quant (Post): <3.1 m[IU]/mL — ABNORMAL LOW (ref >9.9)

## 2018-07-14 LAB — HEPATITIS B SURFACE ANTIGEN: Hepatitis B Surface Ag: NEGATIVE

## 2018-07-14 LAB — HEPATITIS PANEL, ACUTE
HCV Ab: <0.1 s/co ratio (ref 0.0–0.9)
Hep A IgM: NEGATIVE
Hep B C IgM: NEGATIVE
Hepatitis B Surface Ag: NEGATIVE

## 2018-07-14 MED ORDER — SODIUM CHLORIDE 0.9 % IV SOLN
INTRAVENOUS | Status: AC
  Administered 2018-07-14: 23:00:00 via INTRAVENOUS

## 2018-07-14 MED ORDER — LOSARTAN POTASSIUM 25 MG PO TABS
25.0000 mg | ORAL_TABLET | Freq: Every day | ORAL | Status: DC
  Administered 2018-07-15: 25 mg via ORAL
  Filled 2018-07-14: qty 1

## 2018-07-14 MED ORDER — INSULIN GLARGINE 100 UNIT/ML ~~LOC~~ SOLN
20.0000 [IU] | Freq: Every day | SUBCUTANEOUS | Status: DC
  Administered 2018-07-14 – 2018-07-15 (×2): 20 [IU] via SUBCUTANEOUS
  Filled 2018-07-14 (×2): qty 0.2

## 2018-07-14 MED ORDER — INSULIN GLARGINE 100 UNIT/ML ~~LOC~~ SOLN
35.0000 [IU] | Freq: Every day | SUBCUTANEOUS | Status: DC
  Administered 2018-07-15: 35 [IU] via SUBCUTANEOUS
  Filled 2018-07-14 (×2): qty 0.35

## 2018-07-14 NOTE — Discharge Instructions (Signed)
Call the number of the back of your Medicare care and ask about the [facility] program.  Have them walk you through the application and this may help towards prescriptions and cost of test strips.   Hypoglycemia Hypoglycemia occurs when the level of sugar (glucose) in the blood is too low. Hypoglycemia can happen in people who do or do not have diabetes. It can develop quickly, and it can be a medical emergency. For most people with diabetes, a blood glucose level below 70 mg/dL (3.9 mmol/L) is considered hypoglycemia. Glucose is a type of sugar that provides the body's main source of energy. Certain hormones (insulin and glucagon) control the level of glucose in the blood. Insulin lowers blood glucose, and glucagon raises blood glucose. Hypoglycemia can result from having too much insulin in the bloodstream, or from not eating enough food that contains glucose. You may also have reactive hypoglycemia, which happens within 4 hours after eating a meal. What are the causes? Hypoglycemia occurs most often in people who have diabetes and may be caused by:  Diabetes medicine.  Not eating enough, or not eating often enough.  Increased physical activity.  Drinking alcohol on an empty stomach. If you do not have diabetes, hypoglycemia may be caused by:  A tumor in the pancreas.  Not eating enough, or not eating for long periods at a time (fasting).  A severe infection or illness.  Certain medicines. What increases the risk? Hypoglycemia is more likely to develop in:  People who have diabetes and take medicines to lower blood glucose.  People who abuse alcohol.  People who have a severe illness. What are the signs or symptoms? Mild symptoms Mild hypoglycemia may not cause any symptoms. If you do have symptoms, they may include:  Hunger.  Anxiety.  Sweating and feeling clammy.  Dizziness or feeling light-headed.  Sleepiness.  Nausea.  Increased heart rate.  Headache.  Blurry  vision.  Irritability.  Tingling or numbness around the mouth, lips, or tongue.  A change in coordination.  Restless sleep. Moderate symptoms Moderate hypoglycemia can cause:  Mental confusion and poor judgment.  Behavior changes.  Weakness.  Irregular heartbeat. Severe symptoms Severe hypoglycemia is a medical emergency. It can cause:  Fainting.  Seizures.  Loss of consciousness (coma).  Death. How is this diagnosed? Hypoglycemia is diagnosed with a blood test to measure your blood glucose level. This blood test is done while you are having symptoms. Your health care provider may also do a physical exam and review your medical history. How is this treated? This condition can often be treated by immediately eating or drinking something that contains sugar, such as:  Fruit juice, 4-6 oz (120-150 mL).  Regular soda (not diet soda), 4-6 oz (120-150 mL).  Low-fat milk, 4 oz (120 mL).  Several pieces of hard candy.  Sugar or honey, 1 Tbsp (15 mL). Treating hypoglycemia if you have diabetes If you are alert and able to swallow safely, follow the 15:15 rule:  Take 15 grams of a rapid-acting carbohydrate. Talk with your health care provider about how much you should take.  Rapid-acting options include: ? Glucose pills (take 15 grams). ? 6-8 pieces of hard candy. ? 4-6 oz (120-150 mL) of fruit juice. ? 4-6 oz (120-150 mL) of regular (not diet) soda. ? 1 Tbsp (15 mL) honey or sugar.  Check your blood glucose 15 minutes after you take the carbohydrate.  If the repeat blood glucose level is still at or below 70 mg/dL (  3.9 mmol/L), take 15 grams of a carbohydrate again.  If your blood glucose level does not increase above 70 mg/dL (3.9 mmol/L) after 3 tries, seek emergency medical care.  After your blood glucose level returns to normal, eat a meal or a snack within 1 hour.  Treating severe hypoglycemia Severe hypoglycemia is when your blood glucose level is at or  below 54 mg/dL (3 mmol/L). Severe hypoglycemia is a medical emergency. Get medical help right away. If you have severe hypoglycemia and you cannot eat or drink, you may need an injection of glucagon. A family member or close friend should learn how to check your blood glucose and how to give you a glucagon injection. Ask your health care provider if you need to have an emergency glucagon injection kit available. Severe hypoglycemia may need to be treated in a hospital. The treatment may include getting glucose through an IV. You may also need treatment for the cause of your hypoglycemia. Follow these instructions at home:  General instructions  Take over-the-counter and prescription medicines only as told by your health care provider.  Monitor your blood glucose as told by your health care provider.  Limit alcohol intake to no more than 1 drink a day for nonpregnant women and 2 drinks a day for men. One drink equals 12 oz of beer (355 mL), 5 oz of wine (148 mL), or 1 oz of hard liquor (44 mL).  Keep all follow-up visits as told by your health care provider. This is important. If you have diabetes:  Always have a rapid-acting carbohydrate snack with you to treat low blood glucose.  Follow your diabetes management plan as directed. Make sure you: ? Know the symptoms of hypoglycemia. It is important to treat it right away to prevent it from becoming severe. ? Take your medicines as directed. ? Follow your exercise plan. ? Follow your meal plan. Eat on time, and do not skip meals. ? Check your blood glucose as often as directed. Always check before and after exercise. ? Follow your sick day plan whenever you cannot eat or drink normally. Make this plan in advance with your health care provider.  Share your diabetes management plan with people in your workplace, school, and household.  Check your urine for ketones when you are ill and as told by your health care provider.  Carry a medical  alert card or wear medical alert jewelry. Contact a health care provider if:  You have problems keeping your blood glucose in your target range.  You have frequent episodes of hypoglycemia. Get help right away if:  You continue to have hypoglycemia symptoms after eating or drinking something containing glucose.  Your blood glucose is at or below 54 mg/dL (3 mmol/L).  You have a seizure.  You faint. These symptoms may represent a serious problem that is an emergency. Do not wait to see if the symptoms will go away. Get medical help right away. Call your local emergency services (911 in the [place] Summary  Hypoglycemia occurs when the level of sugar (glucose) in the blood is too low.  Hypoglycemia can happen in people who do or do not have diabetes. It can develop quickly, and it can be a medical emergency.  Make sure you know the symptoms of hypoglycemia and how to treat it.  Always have a rapid-acting carbohydrate snack with you to treat low blood sugar. This information is not intended to replace advice given to you by your health care provider. Make  sure you discuss any questions you have with your health care provider. Document Released: 12/30/2004 Document Revised: 06/22/2017 Document Reviewed: 02/02/2015 Elsevier Patient Education  [place]. Hyperglycemia Hyperglycemia occurs when the level of sugar (glucose) in the blood is too high. Glucose is a type of sugar that provides the body's main source of energy. Certain hormones (insulin and glucagon) control the level of glucose in the blood. Insulin lowers blood glucose, and glucagon increases blood glucose. Hyperglycemia can result from having too little insulin in the bloodstream, or from the body not responding normally to insulin. Hyperglycemia occurs most often in people who have diabetes (diabetes mellitus), but it can happen in people who do not have diabetes. It can develop quickly, and it can be life-threatening if  it causes you to become severely dehydrated (diabetic ketoacidosis or hyperglycemic hyperosmolar state). Severe hyperglycemia is a medical emergency. What are the causes? If you have diabetes, hyperglycemia may be caused by:  Diabetes medicine.  Medicines that increase blood glucose or affect your diabetes control.  Not eating enough, or not eating often enough.  Changes in physical activity level.  Being sick or having an infection. If you have prediabetes or undiagnosed diabetes:  Hyperglycemia may be caused by those conditions. If you do not have diabetes, hyperglycemia may be caused by:  Certain medicines, including steroid medicines, beta-blockers, epinephrine, and thiazide diuretics.  Stress.  Serious illness.  Surgery.  Diseases of the pancreas.  Infection. What increases the risk? Hyperglycemia is more likely to develop in people who have risk factors for diabetes, such as:  Having a family member with diabetes.  Having a gene for type 1 diabetes that is passed from parent to child (inherited).  Living in an area with cold weather conditions.  Exposure to certain viruses.  Certain conditions in which the body's disease-fighting (immune) system attacks itself (autoimmune disorders).  Being overweight or obese.  Having an inactive (sedentary) lifestyle.  Having been diagnosed with insulin resistance.  Having a history of prediabetes, gestational diabetes, or polycystic ovarian syndrome (PCOS).  Being of American-Indian, African-American, Hispanic/Latino, or Asian/Pacific Islander descent. What are the signs or symptoms? Hyperglycemia may not cause any symptoms. If you do have symptoms, they may include early warning signs, such as:  Increased thirst.  Hunger.  Feeling very tired.  Needing to urinate more often than usual.  Blurry vision. Other symptoms may develop if hyperglycemia gets worse, such as:  Dry mouth.  Loss of  appetite.  Fruity-smelling breath.  Weakness.  Unexpected or rapid weight gain or weight loss.  Tingling or numbness in the hands or feet.  Headache.  Skin that does not quickly return to normal after being lightly pinched and released (poor skin turgor).  Abdominal pain.  Cuts or bruises that are slow to heal. How is this diagnosed? Hyperglycemia is diagnosed with a blood test to measure your blood glucose level. This blood test is usually done while you are having symptoms. Your health care provider may also do a physical exam and review your medical history. You may have more tests to determine the cause of your hyperglycemia, such as:  A fasting blood glucose (FBG) test. You will not be allowed to eat (you will fast) for at least 8 hours before a blood sample is taken.  An A1c (hemoglobin A1c) blood test. This provides information about blood glucose control over the previous 2-3 months.  An oral glucose tolerance test (OGTT). This measures your blood glucose at two times: ?  After fasting. This is your baseline blood glucose level. ? Two hours after drinking a beverage that contains glucose. How is this treated? Treatment depends on the cause of your hyperglycemia. Treatment may include:  Taking medicine to regulate your blood glucose levels. If you take insulin or other diabetes medicines, your medicine or dosage may be adjusted.  Lifestyle changes, such as exercising more, eating healthier foods, or losing weight.  Treating an illness or infection, if this caused your hyperglycemia.  Checking your blood glucose more often.  Stopping or reducing steroid medicines, if these caused your hyperglycemia. If your hyperglycemia becomes severe and it results in hyperglycemic hyperosmolar state, you must be hospitalized and given IV fluids. Follow these instructions at home:  General instructions  Take over-the-counter and prescription medicines only as told by your health  care provider.  Do not use any products that contain nicotine or tobacco, such as cigarettes and e-cigarettes. If you need help quitting, ask your health care provider.  Limit alcohol intake to no more than 1 drink per day for nonpregnant women and 2 drinks per day for men. One drink equals 12 oz of beer, 5 oz of wine, or 1 oz of hard liquor.  Learn to manage stress. If you need help with this, ask your health care provider.  Keep all follow-up visits as told by your health care provider. This is important. Eating and drinking   Maintain a healthy weight.  Exercise regularly, as directed by your health care provider.  Stay hydrated, especially when you exercise, get sick, or spend time in hot temperatures.  Eat healthy foods, such as: ? Lean proteins. ? Complex carbohydrates. ? Fresh fruits and vegetables. ? Low-fat dairy products. ? Healthy fats.  Drink enough fluid to keep your urine clear or pale yellow. If you have diabetes:  Make sure you know the symptoms of hyperglycemia.  Follow your diabetes management plan, as told by your health care provider. Make sure you: ? Take your insulin and medicines as directed. ? Follow your exercise plan. ? Follow your meal plan. Eat on time, and do not skip meals. ? Check your blood glucose as often as directed. Make sure to check your blood glucose before and after exercise. If you exercise longer or in a different way than usual, check your blood glucose more often. ? Follow your sick day plan whenever you cannot eat or drink normally. Make this plan in advance with your health care provider.  Share your diabetes management plan with people in your workplace, school, and household.  Check your urine for ketones when you are ill and as told by your health care provider.  Carry a medical alert card or wear medical alert jewelry. Contact a health care provider if:  Your blood glucose is at or above 240 mg/dL (13.3 mmol/L) for 2 days  in a row.  You have problems keeping your blood glucose in your target range.  You have frequent episodes of hyperglycemia. Get help right away if:  You have difficulty breathing.  You have a change in how you think, feel, or act (mental status).  You have nausea or vomiting that does not go away. These symptoms may represent a serious problem that is an emergency. Do not wait to see if the symptoms will go away. Get medical help right away. Call your local emergency services (911 in the [place] Do not drive yourself to the hospital. Summary  Hyperglycemia occurs when the level of sugar (glucose) in  the blood is too high.  Hyperglycemia is diagnosed with a blood test to measure your blood glucose level. This blood test is usually done while you are having symptoms. Your health care provider may also do a physical exam and review your medical history.  If you have diabetes, follow your diabetes management plan as told by your health care provider.  Contact your health care provider if you have problems keeping your blood glucose in your target range. This information is not intended to replace advice given to you by your health care provider. Make sure you discuss any questions you have with your health care provider. Document Released: 06/25/2000 Document Revised: 09/17/2015 Document Reviewed: 09/17/2015 Elsevier Patient Education  2020 [place] for People with Diabetes  Why Is Carbohydrate Counting Important? Counting carbohydrate servings may help you control your blood glucose level so that you feel better. The balance between the carbohydrates you eat and insulin determines what your blood glucose level will be after eating. Carbohydrate counting can also help you plan your meals. Which Foods Have Carbohydrates? Foods with carbohydrates include: Breads, crackers, and cereals Pasta, rice, and grains Starchy vegetables, such as potatoes, corn, and  peas Beans and legumes Milk, soy milk, and yogurt Fruits and fruit juices Sweets, such as cakes, cookies, ice cream, jam, and jelly Carbohydrate Servings In diabetes meal planning, 1 serving of a food with carbohydrate has about 15 grams of carbohydrate: Check serving sizes with measuring cups and spoons or a food scale. Read the Nutrition Facts on food labels to find out how many grams of carbohydrate are in foods you eat. The food lists in this handout show portions that have about 15 grams of carbohydrate.  Tips Meal Planning Tips An Eating Plan tells you how many carbohydrate servings to eat at your meals and snacks. For many adults, eating 3 to 5 servings of carbohydrate foods at each meal and 1 or 2 carbohydrate servings for each snack works well. In a healthy daily Eating Plan, most carbohydrates come from: At least 6 servings of fruits and nonstarchy vegetables At least 6 servings of grains, beans, and starchy vegetables, with at least 3 servings from whole grains At least 2 servings of milk or milk products Check your blood glucose level regularly. It can tell you if you need to adjust when you eat carbohydrates. Eating foods that have fiber, such as whole grains, and having very few salty foods is good for your health. Eat 4 to 6 ounces of meat or other protein foods (such as soybean burgers) each day. Choose low-fat sources of protein, such as lean beef, lean pork, chicken, fish, low-fat cheese, or vegetarian foods such as soy. Eat some healthy fats, such as olive oil, canola oil, and nuts. Eat very little saturated fats. These unhealthy fats are found in butter, cream, and high-fat meats, such as bacon and sausage. Eat very little or no trans fats. These unhealthy fats are found in all foods that list partially hydrogenated oil as an ingredient. Label Reading Tips The Nutrition Facts panel on a label lists the grams of total carbohydrate in 1 standard serving. The labels  standard serving may be larger or smaller than 1 carbohydrate serving. To figure out how many carbohydrate servings are in the food: First, look at the labels standard serving size. Check the grams of total carbohydrate. This is the amount of carbohydrate in 1 standard serving. Divide the grams of total carbohydrate by 15. This number equals  the number of carbohydrate servings in 1 standard serving. Remember: 1 carbohydrate serving is 15 grams of carbohydrate. Note: You may ignore the grams of sugars on the Nutrition Facts panel because they are included in the grams of total carbohydrate.  Foods Recommended 1 serving = about 15 grams of carbohydrate Starches 1 slice bread (1 ounce) 1 tortilla (6-inch size)  large bagel (1 ounce) 2 taco shells (5-inch size)  hamburger or hot dog bun ( ounce)  cup ready-to-eat unsweetened cereal  cup cooked cereal 1 cup broth-based soup 4 to 6 small crackers 1/3 cup pasta or rice (cooked)  cup beans, peas, corn, sweet potatoes, winter squash, or mashed or boiled potatoes (cooked)  large baked potato (3 ounces)  ounce pretzels, potato chips, or tortilla chips 3 cups popcorn (popped) Fruit 1 small fresh fruit ( to 1 cup)  cup canned or frozen fruit 2 tablespoons dried fruit (blueberries, cherries, cranberries, mixed fruit, raisins) 17 small grapes (3 ounces) 1 cup melon or berries  cup unsweetened fruit juice Milk 1 cup fat-free or reduced-fat milk 1 cup soy milk 2/3 cup (6 ounces) nonfat yogurt sweetened with sugar-free sweetener Copyright Academy of Nutrition and Dietetics. This handout may be duplicated for client education. Page 4/7 Sweets and Desserts 2-inch square cake (unfrosted) 2 small cookies (2/3 ounce)  cup ice cream or frozen yogurt  cup sherbet or sorbet 1 tablespoon syrup, jam, jelly, table sugar, or honey 2 tablespoons light syrup Other Foods Count 1 cup raw vegetables or  cup cooked nonstarchy vegetables as  zero (0) carbohydrate servings or free foods. If you eat 3 or more servings at one meal, count them as 1 carbohydrate serving. Foods that have less than 20 calories in each serving also may be counted as zero carbohydrate servings or free foods. Count 1 cup of casserole or other mixed foods as 2 carbohydrate servings.  Carbohydrate Counting for People with Diabetes Sample 1-Day Menu Breakfast 1 extra-small banana (1 carbohydrate serving) 3/4 cup corn flakes (1 carbohydrate serving) 1 cup low-fat or fat-free milk (1 carbohydrate serving) 1 slice whole wheat bread (1 carbohydrate serving) 1 teaspoon margarine Lunch 2 ounces [place] slices 2 slices whole wheat bread (2 carbohydrate servings) 2 lettuce leaves 4 celery sticks 4 carrot sticks 1 medium apple (1 carbohydrate serving) 1 cup low-fat or fat-free milk (1 carbohydrate serving) Afternoon Snack 2 tablespoons raisins (1 carbohydrate serving) 3/4 ounce unsalted mini pretzels (1 carbohydrate serving) Evening Meal 3 ounces lean roast beef 1/2 large baked potato (2 carbohydrate servings) 1 tablespoon reduced-fat sour cream 1/2 cup green beans 1 cup vegetable salad 1 tablespoon light salad dressing 1 whole wheat dinner roll (1 carbohydrate serving) 1 teaspoon margarine 1 cup melon balls (1 carbohydrate serving) Evening Snack 6 ounces low-fat sugar-free fruit yogurt (1 carbohydrate serving) 2 tablespoons unsalted nuts  Carbohydrate Counting for People with Diabetes Vegan Sample 1-Day Menu Breakfast 1 cup cooked oatmeal (2 carbohydrate servings)  cup blueberries (1 carbohydrate serving) 2 tablespoons flaxseeds 1 cup soymilk fortified with calcium and vitamin D 1 cup coffee Lunch 2 slices whole wheat bread (2 carbohydrate servings)  cup baked tofu  cup lettuce 2 slices tomato 2 slices avocado  cup baby carrots 1 orange (1 carbohydrate serving) 1 cup soymilk fortified with calcium and vitamin D Evening Meal Burrito  made with: 1 6-inch corn tortilla (1 carbohydrate serving) 1 cup refried vegetarian beans (1 carbohydrate serving)  cup chopped tomatoes  cup lettuce  cup salsa 1/3 cup brown rice (  1 carbohydrate serving) 1 tablespoon olive oil for rice  cup zucchini Evening Snack 6 small whole grain crackers (1 carbohydrate serving) 2 apricots ( carbohydrate serving)  cup unsalted peanuts ( carbohydrate serving)  Carbohydrate Counting for People with Diabetes Vegetarian (Lacto-Ovo) Sample 1-Day Menu Breakfast 1 cup cooked oatmeal (2 carbohydrate servings)  cup blueberries (1 carbohydrate serving) 2 tablespoons flaxseeds 1 egg 1 cup 1% milk (1 carbohydrate serving) 1 cup coffee Lunch 2 slices whole wheat bread (2 carbohydrate servings) 2 ounces low-fat cheese  cup lettuce 2 slices tomato 2 slices avocado  cup baby carrots 1 orange (1 carbohydrate serving) 1 cup unsweetened tea Evening Meal Burrito made with: 1 6-inch corn tortilla (1 carbohydrate serving)  cup refried vegetarian beans (1 carbohydrate serving)  cup tomatoes  cup lettuce  cup salsa 1/3 cup brown rice (1 carbohydrate serving) 1 tablespoon olive oil for rice  cup zucchini 1 cup 1% milk (1 carbohydrate serving) Evening Snack 6 small whole grain crackers (1 carbohydrate serving) 2 apricots ( carbohydrate serving)  cup unsalted peanuts ( carbohydrate serving)

## 2018-07-14 NOTE — Progress Notes (Signed)
Patient stated she would call son and update him.

## 2018-07-14 NOTE — Progress Notes (Signed)
  Date: 07/14/2018  Patient name: Hayley Case  Medical record number: [id number]  Date of birth: 09-Jan-1951   I have seen and evaluated this patient and I have discussed the plan of care with the house staff. Please see Dr. Redgie Grayer note for complete details. I concur with his findings and plan.   Patient with elevated BP over last 24 hours, would consider adding a 3rd BP agent (avoiding ACE-I) tomorrow.  Continue amlodipine and metoprolol.   Sid Falcon, MD 07/14/2018, 1:29 PM

## 2018-07-14 NOTE — Plan of Care (Addendum)
  RD consulted for nutrition education regarding diabetes.   Lab Results  Component Value Date   HGBA1C 10.1 (H) 05/18/2018    RD provided "Carbohydrate Counting for People with Diabetes" handout from the Academy of [employer] in discharge instructions. Discussed different food groups and their effects on blood sugar, emphasizing carbohydrate-containing foods. Provided list of carbohydrates and recommended serving sizes of common foods.  Discussed importance of controlled and consistent carbohydrate intake throughout the day. Provided examples of ways to balance meals/snacks and encouraged intake of high-fiber, whole grain complex carbohydrates. Teach back method used.  Spoke with pt via phone. Reports she has trouble with beverages and carbohydrate options. States potatoes are her main carb source and she wants to know things she can substitute for them. RD discussed the importance of fiber when choosing carb sources. Gave examples like whole fruit, whole wheat pasta, brown rice, and whole wheat bread. Since moving from [place] pt has found herself consuming more sweet tea. Discussed the effect these drinks have on blood sugar and how to make appropriate substitutions.   Expect fair compliance.  Body mass index is 35.15 kg/m. Pt meets criteria for obese based on current BMI.  Current diet order is carb modified, patient is consuming approximately 100% of meals at this time. Labs and medications reviewed. No further nutrition interventions warranted at this time. RD contact information provided. If additional nutrition issues arise, please re-consult RD.  Hayley Case RD, LDN Clinical Nutrition Pager # [phone]

## 2018-07-14 NOTE — Progress Notes (Signed)
Inpatient Diabetes Program Recommendations  AACE/ADA: New Consensus Statement on Inpatient Glycemic Control (2015)  Target Ranges:  Prepandial:   less than 140 mg/dL      Peak postprandial:   less than 180 mg/dL (1-2 hours)      Critically ill patients:  140 - 180 mg/dL   Lab Results  Component Value Date   GLUCAP 292 (H) 07/14/2018   HGBA1C 10.1 (H) 05/18/2018    Review of Glycemic Control Results for ADIYA, SELMER (MRN [id number]) as of 07/14/2018 09:36  Ref. Range 07/13/2018 11:40 07/13/2018 16:22 07/13/2018 20:53 07/14/2018 06:59  Glucose-Capillary Latest Ref Range: 70 - 99 mg/dL 304 (H) 230 (H) 273 (H) 292 (H)   Diabetes history: Type 2 DM Outpatient Diabetes medications: Lantus 25-35 units QHS, 45 units QAM Current orders for Inpatient glycemic control: Lantus 35 units QHS, 20 units QD, Novolog 0-15 units TID  Inpatient Diabetes Program Recommendations  Noted addition of carb mod diet.  FSBG 326 mg/dL per serum.   Consider increasing AM dose of Lantus to 40 units QAM. Thus, patient would need an additional 20 units x1 this AM as she has already received a portion of her morning Lantus.  NOTE: Correction doses MUST be given within an hour of last CBG.  Patient will need a blood glucose meter at discharge. Blood glucose meter kit (includes lancets and test strips) ([id number]).   Thanks, Bronson Curb, MSN, RNC-OB Diabetes Coordinator [phone] (8a-5p)

## 2018-07-14 NOTE — Progress Notes (Addendum)
   Subjective:   Overall [name] feels better today. She says she feels a little foggy, but think that is because of the sleep medicine from the prior evening. She reports that she still feels itchy and is hopeful that will improve over the next couple days. She says that she hasn't had a BM in about 3 days, which is abnormal for her. She didn't have breakfast because she hasn't had much of an appetite, but thinks she'll be able to eat. She is open to having her diet advanced today.  Of note, she reported that her home insulin regimen of Lantus BID, 40U AM, 25U PM. No other complaints noted today.   Objective:  Vital signs in last 24 hours: Vitals:   07/13/18 0942 07/13/18 1625 07/13/18 2054 07/14/18 0454  BP: (!) 178/75 (!) 167/71 (!) 156/78 138/61  Pulse: 85 75 72 91  Resp: 18 18 18 18   Temp: 97.8 F (36.6 C) 97.6 F (36.4 C) 97.9 F (36.6 C) 98.7 F (37.1 C)  TempSrc: Oral Oral Oral Oral  SpO2: 100% 99% 97% 98%  Weight:      Height:       Physical Exam  Constitutional: She is oriented to person, place, and time and well-developed, well-nourished, and in no distress. No distress.  HENT:  Head: Normocephalic and atraumatic.  Cardiovascular: Normal rate, regular rhythm and normal heart sounds. Exam reveals no gallop and no friction rub.  No murmur heard. Pulmonary/Chest: Breath sounds normal. No respiratory distress. She has no wheezes. She has no rales.  Abdominal: Bowel sounds are normal. She exhibits distension. She exhibits no mass. There is no abdominal tenderness. There is no rebound and no guarding.  Neurological: She is alert and oriented to person, place, and time.  Skin: She is not diaphoretic.  Psychiatric: Affect normal.     Assessment/Plan:  Principal Problem:   Acute hepatitis Active Problems:   Diabetes mellitus ([place])   Essential hypertension   Acute on chronic renal insufficiency  #Intractable nausea and vomiting Probable drug-induced liver injury  Rule out autoimmune hepatitis  Symptomatically, Hayley Case continues to improve. She says that she has mild nausea that is managed well with Zofran. She did not vomit in the past 24 hours. On physical exam she has no evidence of encephalopathy. Objectively, her liver function panel is about the same as yesterday, but overall is trending down.  Our current working theory continues to be DILI with possible culprits including ciprofloxacin, lisinopril, statin. All of which have been discontinued for now. Hep B surface antigen, surface antibody, and total core antibody returned negative.  In addition, urinalysis showed moderate hemoglobin with crystals, rasing suspicion for rhabdomyolysis.  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

## 2018-07-14 NOTE — Progress Notes (Signed)
Pt refuses bed alarm. RN educated. RN will continue to monitor.

## 2018-07-15 ENCOUNTER — Other Ambulatory Visit: Payer: Self-pay | Admitting: Internal Medicine

## 2018-07-15 DIAGNOSIS — Z880 Allergy status to penicillin: Secondary | ICD-10-CM

## 2018-07-15 DIAGNOSIS — Z888 Allergy status to other drugs, medicaments and biological substances status: Secondary | ICD-10-CM

## 2018-07-15 DIAGNOSIS — Z881 Allergy status to other antibiotic agents status: Secondary | ICD-10-CM

## 2018-07-15 LAB — COMPREHENSIVE METABOLIC PANEL
ALT: 553 U/L — ABNORMAL HIGH (ref 0–44)
AST: 416 U/L — ABNORMAL HIGH (ref 15–41)
Albumin: 2.5 g/dL — ABNORMAL LOW (ref 3.5–5.0)
Alkaline Phosphatase: 379 U/L — ABNORMAL HIGH (ref 38–126)
Anion gap: 10 (ref 5–15)
BUN: 18 mg/dL (ref 8–23)
CO2: 22 mmol/L (ref 22–32)
Calcium: 8.7 mg/dL — ABNORMAL LOW (ref 8.9–10.3)
Chloride: 107 mmol/L (ref 98–111)
Creatinine, Ser: 1.95 mg/dL — ABNORMAL HIGH (ref 0.44–1.00)
GFR calc Af Amer: 30 mL/min — ABNORMAL LOW (ref >60)
GFR calc non Af Amer: 26 mL/min — ABNORMAL LOW (ref >60)
Glucose, Bld: 199 mg/dL — ABNORMAL HIGH (ref 70–99)
Potassium: 3.8 mmol/L (ref 3.5–5.1)
Sodium: 139 mmol/L (ref 135–145)
Total Bilirubin: 1.5 mg/dL — ABNORMAL HIGH (ref 0.3–1.2)
Total Protein: 6.4 g/dL — ABNORMAL LOW (ref 6.5–8.1)

## 2018-07-15 LAB — GLUCOSE, CAPILLARY
Glucose-Capillary: 179 mg/dL — ABNORMAL HIGH (ref 70–99)
Glucose-Capillary: 241 mg/dL — ABNORMAL HIGH (ref 70–99)
Glucose-Capillary: 298 mg/dL — ABNORMAL HIGH (ref 70–99)
Glucose-Capillary: 257 mg/dL — ABNORMAL HIGH (ref 70–99)

## 2018-07-15 MED ORDER — LOSARTAN POTASSIUM 25 MG PO TABS: 25.0000 mg | ORAL_TABLET | Freq: Every day | ORAL | 0 refills | Status: DC

## 2018-07-15 NOTE — Care Management Important Message (Signed)
Important Message  Patient Details  Name: Hayley Case MRN: [id number] Date of Birth: 1950-11-30   Medicare Important Message Given:  Yes     [name] 07/15/2018, 1:39 PM

## 2018-07-15 NOTE — Progress Notes (Addendum)
Inpatient Diabetes Program Recommendations  AACE/ADA: New Consensus Statement on Inpatient Glycemic Control (2015)  Target Ranges:  Prepandial:   less than 140 mg/dL      Peak postprandial:   less than 180 mg/dL (1-2 hours)      Critically ill patients:  140 - 180 mg/dL   Lab Results  Component Value Date   GLUCAP 179 (H) 07/15/2018   HGBA1C 10.1 (H) 05/18/2018    Review of Glycemic Control Results for Hayley Case, Hayley Case (MRN [id number]) as of 07/15/2018 09:17  Ref. Range 07/14/2018 11:33 07/14/2018 16:08 07/14/2018 21:24 07/15/2018 07:06  Glucose-Capillary Latest Ref Range: 70 - 99 mg/dL 292 (H) 287 (H) 345 (H) 179 (H)   Diabetes history:Type 2 DM Outpatient Diabetes medications:Lantus 25-35 units QHS, 45 units QAM Current orders for Inpatient glycemic control:Lantus 35 units QHS, 35 units QD, Novolog 0-15 units TID  Inpatient Diabetes Program Recommendations  If post prandials exceed 180 mg/dL, consider adding Novolog 3 units TID (assuming patient is consuming >50% of meal).   NOTE: Correction doses MUST be given within an hour of last CBG.  Patient will need a blood glucose meter at discharge. Blood glucose meter kit (includes lancets and test strips) ([id number]).  Thanks, Bronson Curb, MSN, RNC-OB Diabetes Coordinator [phone] (8a-5p)

## 2018-07-15 NOTE — Plan of Care (Signed)
  Problem: Health Behavior/Discharge Planning: Goal: Ability to manage health-related needs will improve Outcome: Progressing   Problem: Clinical Measurements: Goal: Ability to maintain clinical measurements within normal limits will improve Outcome: Progressing   Problem: Clinical Measurements: Goal: Diagnostic test results will improve Outcome: Progressing   Problem: Nutrition: Goal: Adequate nutrition will be maintained Outcome: Progressing   Problem: Elimination: Goal: Will not experience complications related to urinary retention Outcome: Completed/Met   Problem: Pain Managment: Goal: General experience of comfort will improve Outcome: Progressing

## 2018-07-15 NOTE — Progress Notes (Signed)
   Subjective:   Hayley Case feels well today. Her appetite is better and she ate breakfast. She does not have any nausea or vomiting. She is asking that I call her brother to update him on her status.   Objective:  Vital signs in last 24 hours: Vitals:   07/14/18 1609 07/14/18 2124 07/15/18 0430 07/15/18 0848  BP: 131/65 (!) 154/79 (!) 179/90 (!) 162/76  Pulse: 80 84 90 81  Resp: 18 18 18 18   Temp: 98.6 F (37 C) 98.2 F (36.8 C) 98 F (36.7 C) 97.7 F (36.5 C)  TempSrc: Oral Oral Oral Oral  SpO2: 99% 98% 97% 99%  Weight:  95.8 kg    Height:       Physical Exam Vitals signs reviewed.  Constitutional:      General: She is not in acute distress.    Appearance: She is obese. She is not ill-appearing or toxic-appearing.  HENT:     Head: Normocephalic and atraumatic.  Cardiovascular:     Rate and Rhythm: Normal rate and regular rhythm.     Pulses: Normal pulses.     Heart sounds: Normal heart sounds. No murmur. No friction rub. No gallop.   Pulmonary:     Effort: Pulmonary effort is normal. No respiratory distress.     Breath sounds: Normal breath sounds. No wheezing or rales.  Abdominal:     General: There is no distension.     Palpations: Abdomen is soft.     Tenderness: There is no abdominal tenderness. There is no guarding.  Neurological:     General: No focal deficit present.     Mental Status: She is alert. Mental status is at baseline.  Psychiatric:        Mood and Affect: Mood normal.        Behavior: Behavior normal.        Thought Content: Thought content normal.        Judgment: Judgment normal.      Assessment/Plan:  Principal Problem:   Acute hepatitis Active Problems:   Diabetes mellitus ([place])   Essential hypertension   Acute on chronic renal insufficiency   Non-intractable vomiting  #Intractable nausea and vomiting - RESOLVED Probable drug-induced liver injury Rule out autoimmune hepatitis  Symptomatically, Hayley Case does not have any  more nausea or vomiting, and is managed well by zofran.   She did not vomit in the past 48 hours. Objectively, her liver function panel continues to trend downward.  Hepatitis B panel and autoimmune antiboies came back negative. Most likley a case of DILI with possible culprits including ciprofloxacin, lisinopril, statin.All of which have been discontinued.  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 Glucose this AM was 199, yesterday was 326.   #Hypertension -Continue metoprolol succinate 100 mg daily amlodipine 10 mg daily - Start Losartan 25mg  today for better BP control. BP has been elevated in the 170s consistently over the last 2 days.   Dispo: Anticipated discharge today.  Earlene Plater, MD 07/15/2018, 3:13 PM Pager: [phone]

## 2018-07-15 NOTE — Progress Notes (Signed)
  Date: 07/15/2018  Patient name: Hayley Case  Medical record number: [id number]  Date of birth: [birth year]   I have seen and evaluated this patient and I have discussed the plan of care with the house staff. Please see Dr. Redgie Grayer note for complete details. I concur with his findings and plan.  Discharge today.  Will need good follow up for repeat labs and discussion about treatment for HLD.   Sid Falcon, MD 07/15/2018, 8:32 PM

## 2018-07-15 NOTE — Progress Notes (Signed)
Patient is discharged. Son picked up patient at the [facility].

## 2018-07-15 NOTE — Plan of Care (Signed)
  Problem: Health Behavior/Discharge Planning: Goal: Ability to manage health-related needs will improve 07/15/2018 1825 by Dolores Hoose, RN Outcome: Completed/Met   Problem: Clinical Measurements: Goal: Ability to maintain clinical measurements within normal limits will improve 07/15/2018 1825 by Dolores Hoose, RN Outcome: Completed/Met 07/15/2018 1109 by Dolores Hoose, RN Outcome: Progressing   Problem: Clinical Measurements: Goal: Diagnostic test results will improve 07/15/2018 1825 by Dolores Hoose, RN Outcome: Completed/Met 07/15/2018 1109 by Dolores Hoose, RN Outcome: Progressing   Problem: Nutrition: Goal: Adequate nutrition will be maintained 07/15/2018 1825 by Dolores Hoose, RN Outcome: Completed/Met 07/15/2018 1109 by Dolores Hoose, RN Outcome: Progressing   Problem: Elimination: Goal: Will not experience complications related to bowel motility Outcome: Completed/Met

## 2018-07-15 NOTE — Progress Notes (Signed)
DISCHARGE NOTE [facility] to be discharged Home per MD order. Patient verbalized understanding.  Skin clean, dry and intact without evidence of skin break down, no evidence of skin tears noted. IV catheter discontinued intact. Site without signs and symptoms of complications. Dressing and pressure applied. Pt denies pain at the site currently. No complaints noted.  Patient free of lines, drains, and wounds.   Discharge packet assembled. An After Visit Summary (AVS) was printed and given to the patient. Patient escorted via wheelchair and discharged to family member via private vehicle.  All questions and concerns addressed.   Dolores Hoose, RN

## 2018-07-15 NOTE — Progress Notes (Signed)
Patient son states he won't able to give patient a ride at this time. Will be here at 10:30pm tonight to pick up patient.

## 2018-07-21 LAB — HEPATITIS PANEL, ACUTE
Hep A IgM: NEGATIVE
Hep B C IgM: NEGATIVE
Hep C Virus Ab: <0.1 s/co ratio (ref 0.0–0.9)
Hepatitis B Surface Ag: NEGATIVE

## 2018-07-21 LAB — SPECIMEN STATUS REPORT

## 2018-07-21 LAB — GAMMA GT: GGT: 145 IU/L — ABNORMAL HIGH (ref 0–60)

## 2018-07-28 ENCOUNTER — Ambulatory Visit: Payer: Medicare Other | Attending: Nurse Practitioner | Admitting: Nurse Practitioner

## 2018-07-28 ENCOUNTER — Encounter: Payer: Self-pay | Admitting: Nurse Practitioner

## 2018-07-28 ENCOUNTER — Other Ambulatory Visit: Payer: Self-pay

## 2018-07-28 VITALS — BP 129/71 | HR 78 | Temp 98.5°F | Ht 64.0 in | Wt 215.6 lb

## 2018-07-28 DIAGNOSIS — I10 Essential (primary) hypertension: Secondary | ICD-10-CM | POA: Diagnosis not present

## 2018-07-28 DIAGNOSIS — E1165 Type 2 diabetes mellitus with hyperglycemia: Secondary | ICD-10-CM

## 2018-07-28 DIAGNOSIS — F458 Other somatoform disorders: Secondary | ICD-10-CM

## 2018-07-28 DIAGNOSIS — Z1211 Encounter for screening for malignant neoplasm of colon: Secondary | ICD-10-CM

## 2018-07-28 DIAGNOSIS — Z794 Long term (current) use of insulin: Secondary | ICD-10-CM

## 2018-07-28 DIAGNOSIS — M1A9XX0 Chronic gout, unspecified, without tophus (tophi): Secondary | ICD-10-CM | POA: Diagnosis not present

## 2018-07-28 DIAGNOSIS — E118 Type 2 diabetes mellitus with unspecified complications: Secondary | ICD-10-CM

## 2018-07-28 LAB — POCT URINALYSIS DIP (CLINITEK)
Bilirubin, UA: NEGATIVE
Glucose, UA: 500 mg/dL — AB
Ketones, POC UA: NEGATIVE mg/dL
Leukocytes, UA: NEGATIVE
Nitrite, UA: NEGATIVE
POC PROTEIN,UA: >=300 — AB
Spec Grav, UA: 1.015 (ref 1.010–1.025)
Urobilinogen, UA: 0.2 E.U./dL
pH, UA: 5.5 (ref 5.0–8.0)

## 2018-07-28 LAB — GLUCOSE, POCT (MANUAL RESULT ENTRY)
POC Glucose: 419 mg/dl — AB (ref 70–99)
POC Glucose: 395 mg/dl — AB (ref 70–99)

## 2018-07-28 MED ORDER — ONETOUCH DELICA LANCING DEV MISC: 99 refills | Status: DC

## 2018-07-28 MED ORDER — HYDROXYZINE HCL 10 MG PO TABS: 10.0000 mg | ORAL_TABLET | Freq: Two times a day (BID) | ORAL | 2 refills | Status: DC | PRN

## 2018-07-28 MED ORDER — HYDROXYZINE HCL 10 MG PO TABS: 10.0000 mg | ORAL_TABLET | Freq: Three times a day (TID) | ORAL | 2 refills | Status: DC | PRN

## 2018-07-28 MED ORDER — LOSARTAN POTASSIUM 25 MG PO TABS: 25.0000 mg | ORAL_TABLET | Freq: Every day | ORAL | 0 refills | Status: DC

## 2018-07-28 MED ORDER — AMLODIPINE BESYLATE 10 MG PO TABS: ORAL_TABLET | ORAL | 0 refills | Status: DC

## 2018-07-28 MED ORDER — INSULIN ASPART 100 UNIT/ML ~~LOC~~ SOLN
20.0000 [IU] | Freq: Once | SUBCUTANEOUS | Status: AC
  Administered 2018-07-28: 20 [IU] via SUBCUTANEOUS

## 2018-07-28 MED ORDER — CARVEDILOL 25 MG PO TABS: 25.0000 mg | ORAL_TABLET | Freq: Two times a day (BID) | ORAL | 3 refills | Status: DC

## 2018-07-28 MED ORDER — ALLOPURINOL 100 MG PO TABS: 100.0000 mg | ORAL_TABLET | Freq: Every day | ORAL | 0 refills | Status: DC

## 2018-07-28 MED ORDER — NOVOLOG FLEXPEN 100 UNIT/ML ~~LOC~~ SOPN: 5.0000 [IU] | PEN_INJECTOR | Freq: Three times a day (TID) | SUBCUTANEOUS | 11 refills | Status: DC

## 2018-07-28 MED ORDER — ONETOUCH DELICA LANCETS 30G MISC: 1.0000 | Freq: Every day | 11 refills | Status: AC

## 2018-07-28 MED ORDER — GLUCOSE BLOOD VI STRP: ORAL_STRIP | 12 refills | Status: DC

## 2018-07-28 MED ORDER — LANTUS SOLOSTAR 100 UNIT/ML ~~LOC~~ SOPN: 40.0000 [IU] | PEN_INJECTOR | Freq: Two times a day (BID) | SUBCUTANEOUS | 3 refills | Status: DC

## 2018-07-28 MED ORDER — ONETOUCH VERIO W/DEVICE KIT: PACK | 0 refills | Status: DC

## 2018-07-28 NOTE — Progress Notes (Signed)
Assessment & Plan:  Hayley Case was seen today for hospitalization follow-up.  Diagnoses and all orders for this visit:  Type 2 diabetes mellitus with complication, with long-term current use of insulin (HCC) -     Glucose (CBG) -     CBC -     CMP14+EGFR -     Insulin Glargine (LANTUS SOLOSTAR) 100 UNIT/ML Solostar Pen; Inject 40 Units into the skin 2 (two) times daily. -     Lancet Devices (ONE TOUCH DELICA LANCING DEV) MISC; Use as instructed. Inject into the skin twice daily -     glucose blood test strip; Use as instructed. Inject into the skin twice daily. -     Blood Glucose Monitoring Suppl (ONETOUCH VERIO) w/Device KIT; Use as instructed. Inject into the skin twice daily. -     OneTouch Delica Lancets 42L MISC; 1 applicator by Subdermal route daily. -     POCT URINALYSIS DIP (CLINITEK) -     insulin aspart (novoLOG) injection 20 Units -     Glucose (CBG) Continue blood sugar control as discussed in office today, low carbohydrate diet, and regular physical exercise as tolerated, 150 minutes per week (30 min each day, 5 days per week, or 50 min 3 days per week). Keep blood sugar logs with fasting goal of 90-130 mg/dl, post prandial (after you eat) less than 180.  For Hypoglycemia: BS <60 and Hyperglycemia BS >400; contact the clinic ASAP. Annual eye exams and foot exams are recommended.   Essential hypertension -     losartan (COZAAR) 25 MG tablet; Take 1 tablet (25 mg total) by mouth daily. -     amLODipine (NORVASC) 10 MG tablet; TAKE 1 TABLET(10 MG) BY MOUTH DAILY -     carvedilol (COREG) 25 MG tablet; Take 1 tablet (25 mg total) by mouth 2 (two) times daily with a meal. Continue all antihypertensives as prescribed.  Remember to bring in your blood pressure log with you for your follow up appointment.  DASH/Mediterranean Diets are healthier choices for HTN.    Colon cancer screening -     Fecal occult blood, imunochemical([facility])  Chronic gout without tophus,  unspecified cause, unspecified site -     allopurinol (ZYLOPRIM) 100 MG tablet; Take 1 tablet (100 mg total) by mouth daily. Chronic and stable  Pruritic, psychogenic -     hydrOXYzine (ATARAX/VISTARIL) 10 MG tablet; Take 1 tablet (10 mg total) by mouth 2 (two) times daily as needed for itching or anxiety.   Patient has been counseled on age-appropriate routine health concerns for screening and prevention. These are reviewed and up-to-date. Referrals have been placed accordingly. Immunizations are up-to-date or declined.    Subjective:   Chief Complaint  Patient presents with  . Hospitalization Follow-up    Pt. is here for Hospital follow up. Pt. stated she feel smuch better    HPI Hayley Case 68 y.o. female presents to office today for [place] Admitted to the hospital 07-12-2018 for Acute drug induced Hepatitis (Atorvastatin was dc'd on discharge) and dehydration. She is currently taking 35 units during the day and 25 units at night. Blood pressure was elevated in the hospital and patient was started on losartan 51m in conjunction with her current amlodipine 10 mg and metoprolol XL 100 mg daily.   On admission, she was noted to have significantly elevated transaminases AST (687<<89), ALT (914<<67), total bilirubin of 2.3, alkaline phosphatase of 524.  She has had a GGT a month  prior to arrival and was elevated at 145., few transaminases revealed AST 695, ALT 922, alkaline phosphatase 529, total bilirubin 1.9, direct bilirubin 1, indirect bilirubin of 0.9, INR of 1, elevated LDH of 380.  CT abdomen and pelvis, ultrasound abdomen were unremarkable did not show any acute biliary abnormality.   Today I will switch her metoprolol XL to coreg as she endorses feeling tired after she takes her metoprolol. Will have her return in 2 weeks for BP recheck. She also monitors her blood pressure at home and will message me on my chart with any abnormal blood pressure readings prior to her next office  visit    DM TYPE 2 Not well controlled. She was instructed to take Lantus 40 units BID and humalog 5units TID with meals. Goal A1C  <8. She ran out of strips and lancets and has not been monitoring her blood glucose levels as instructed. She has chronic CKD so we are limited with most diabetic agents.  Lab Results  Component Value Date   HGBA1C 10.1 (H) 05/18/2018   BP Readings from Last 3 Encounters:  07/28/18 129/71  07/15/18 (!) 162/86  07/06/18 129/73    Pruritic  May be of a psychogenic origin.She endorses increased pruritis when she is stressed. Specifically when it is time to start her son's home Dialysis. Will try low dose hydroxyzine. May need to start SSRI as well.  Depression screen [facility] 2/9 07/06/2018 02/16/2018 11/13/2017 08/12/2017 07/08/2017  Decreased Interest _0 Down, Depressed, Hopeless _1 PHQ - 2 Score _2 Altered sleeping _3 Tired, decreased energy _4 Change in appetite _5 Feeling bad or failure about yourself  _6 Trouble concentrating _7 Moving slowly or fidgety/restless 0 _8 Suicidal thoughts 0 0 0 0 0  PHQ-9 Score _9 Some recent data might be hidden   GAD 7 : Generalized Anxiety Score 07/06/2018 02/16/2018 11/13/2017 08/12/2017  Nervous, Anxious, on Edge _10 Control/stop worrying _11 Worry too much - different things _12 Trouble relaxing _13 Restless 1 1 0 1  Easily annoyed or irritable 0 0 0 0  Afraid - awful might happen _14 Total GAD 7 Score _15 Review of Systems  Constitutional: Negative for fever, malaise/fatigue and weight loss.  HENT: Negative.  Negative for nosebleeds.   Eyes: Negative.  Negative for blurred vision, double vision and photophobia.  Respiratory: Negative.  Negative for cough and shortness of breath.   Cardiovascular: Negative.  Negative for chest pain, palpitations and leg swelling.  Gastrointestinal: Negative.   Negative for heartburn, nausea and vomiting.  Musculoskeletal: Negative.  Negative for myalgias.  Skin: Positive for itching. Negative for rash.  Neurological: Negative.  Negative for dizziness, focal weakness, seizures and headaches.  Psychiatric/Behavioral: Positive for depression. Negative for suicidal ideas. The patient is nervous/anxious.     Past Medical History:  Diagnosis Date  . CKD (chronic kidney disease)   . Diabetes mellitus without complication ([place])   . Hyperlipidemia   . Hypertension     Past Surgical History:  Procedure Laterality Date  . BREAST BIOPSY    .  BREAST EXCISIONAL BIOPSY Left     History reviewed. No pertinent family history.  Social History Reviewed with no changes to be made today.   Outpatient Medications Prior to Visit  Medication Sig Dispense Refill  . acetaminophen (TYLENOL) 500 MG tablet Take 500 mg by mouth every 4 (four) hours as needed for headache (pain).    . Ascorbic Acid (VITAMIN C PO) Take 1 tablet by mouth 2 (two) times a week.    . Blood Glucose Monitoring Suppl (ACCU-CHEK AVIVA PLUS) w/Device KIT Use as instructed 1 kit 0  . Carboxymethylcellulose Sodium (REFRESH TEARS OP) Place 1 drop into both eyes at bedtime.     . Insulin Pen Needle (B-D UF III MINI PEN NEEDLES) 31G X 5 MM MISC Use as directed. Check blood glucose levels by fingerstick twice a day. 100 each 5  . Lancets Thin MISC Use as instructed 100 each 3  . loratadine (CLARITIN) 10 MG tablet Take 10 mg by mouth daily as needed (seasonal allergies).    . Misc. Devices MISC Please provide patient with insurance approved blood pressure monitor. 1 each 0  . Vitamin D, Ergocalciferol, (DRISDOL) 1.25 MG (50000 UT) CAPS capsule Take 1 capsule (50,000 Units total) by mouth every 7 (seven) days. (Patient taking differently: Take 50,000 Units by mouth every Friday. ) 12 capsule 1  . allopurinol (ZYLOPRIM) 100 MG tablet Take 1 tablet (100 mg total) by mouth daily. (Patient taking  differently: Take 100 mg by mouth at bedtime. ) 90 tablet 0  . amLODipine (NORVASC) 10 MG tablet TAKE 1 TABLET(10 MG) BY MOUTH DAILY (Patient taking differently: Take 10 mg by mouth daily. ) 90 tablet 0  . diphenhydrAMINE (BENADRYL) 25 MG tablet Take 25 mg by mouth every 6 (six) hours as needed for itching.    Marland Kitchen glucose blood (ACCU-CHEK ACTIVE STRIPS) test strip Use as instructed 100 each 12  . Lancets (ACCU-CHEK SOFT TOUCH) lancets Use as instructed Check blood sugar fasting and at bedtime and some days after lunch 100 each 12  . losartan (COZAAR) 25 MG tablet Take 1 tablet (25 mg total) by mouth daily. 30 tablet 0  . metoprolol succinate (TOPROL-XL) 100 MG 24 hr tablet TAKE 1 TABLET BY MOUTH DAILY. TAKE WITH OR IMMEDIATELY FOLLOWING A MEAL (Patient taking differently: Take 100 mg by mouth at bedtime. Take with or immediately following a meal) 90 tablet 0  . Elastic Bandages & Supports (MEDICAL COMPRESSION STOCKINGS) MISC Apply to both legs during the day. (Patient not taking: Reported on 07/28/2018) 1 each 0  . fluticasone (FLONASE) 50 MCG/ACT nasal spray Place 2 sprays into both nostrils daily. (Patient not taking: Reported on 11/13/2017) 16 g 6  . olopatadine (PATANOL) 0.1 % ophthalmic solution Place 1 drop into both eyes 2 (two) times daily. (Patient not taking: Reported on 07/12/2018) 5 mL 12  . glucose blood test strip Use as instructed (Patient not taking: Reported on 07/28/2018) 100 each 12  . Insulin Glargine (LANTUS SOLOSTAR) 100 UNIT/ML Solostar Pen Inject 25 units into the skin nightly and 35 units into skin during the day (Patient taking differently: Inject 25-40 Units into the skin See admin instructions. Inject 40 units subcutaneously before breakfast and inject 25-35 units at bedtime (based on CBG or evening meal intake)) 90 mL 3   No facility-administered medications prior to visit.     Allergies  Allergen Reactions  . Lisinopril Cough  . Penicillins Hives and Itching    Did it  involve swelling  of the face/tongue/throat, SOB, or low BP? No Did it involve sudden or severe rash/hives, skin peeling, or any reaction on the inside of your mouth or nose? Yes Did you need to seek medical attention at a hospital or doctor's office? Yes When did it last happen?2012 If all above answers are "NO", may proceed with cephalosporin use.  Sarina Ill [Sulfamethoxazole-Trimethoprim] Hives       Objective:    BP 129/71 (BP Location: Right Arm, Patient Position: Sitting, Cuff Size: Normal)   Pulse 78   Temp 98.5 F (36.9 C) (Oral)   Ht 5' 4" (1.626 m)   Wt 215 lb 9.6 oz (97.8 kg)   SpO2 99%   BMI 37.01 kg/m  Wt Readings from Last 3 Encounters:  07/28/18 215 lb 9.6 oz (97.8 kg)  07/14/18 211 lb 3.2 oz (95.8 kg)  07/06/18 214 lb (97.1 kg)    Physical Exam Vitals signs and nursing note reviewed.  Constitutional:      Appearance: She is well-developed.  HENT:     Head: Normocephalic and atraumatic.  Neck:     Musculoskeletal: Normal range of motion.  Cardiovascular:     Rate and Rhythm: Normal rate and regular rhythm.     Heart sounds: Normal heart sounds. No murmur. No friction rub. No gallop.   Pulmonary:     Effort: Pulmonary effort is normal. No tachypnea or respiratory distress.     Breath sounds: Normal breath sounds. No decreased breath sounds, wheezing, rhonchi or rales.  Chest:     Chest wall: No tenderness.  Abdominal:     General: Bowel sounds are normal.     Palpations: Abdomen is soft.  Musculoskeletal: Normal range of motion.  Skin:    General: Skin is warm and dry.  Neurological:     Mental Status: She is alert and oriented to person, place, and time.     Coordination: Coordination normal.  Psychiatric:        Behavior: Behavior normal. Behavior is cooperative.        Thought Content: Thought content normal.        Judgment: Judgment normal.          Patient has been counseled extensively about nutrition and exercise as well as the  importance of adherence with medications and regular follow-up. The patient was given clear instructions to go to ER or return to medical center if symptoms don't improve, worsen or new problems develop. The patient verbalized understanding.   Follow-up: Return in about 2 weeks (around 08/11/2018) for BP recheck with [facility].   Gildardo Pounds, FNP-BC [facility] [place], [place]   07/29/2018, 2:55 PM

## 2018-07-29 ENCOUNTER — Encounter: Payer: Self-pay | Admitting: Nurse Practitioner

## 2018-07-29 ENCOUNTER — Telehealth: Payer: Self-pay

## 2018-07-29 ENCOUNTER — Other Ambulatory Visit: Payer: Self-pay | Admitting: Pharmacist

## 2018-07-29 ENCOUNTER — Other Ambulatory Visit: Payer: Self-pay | Admitting: Family Medicine

## 2018-07-29 LAB — CBC
Hematocrit: 36.7 % (ref 34.0–46.6)
Hemoglobin: 12.2 g/dL (ref 11.1–15.9)
MCH: 29 pg (ref 26.6–33.0)
MCHC: 33.2 g/dL (ref 31.5–35.7)
MCV: 87 fL (ref 79–97)
Platelets: 232 10*3/uL (ref 150–450)
RBC: 4.2 x10E6/uL (ref 3.77–5.28)
RDW: 13.2 % (ref 11.7–15.4)
WBC: 6.1 10*3/uL (ref 3.4–10.8)

## 2018-07-29 LAB — CMP14+EGFR
ALT: 295 IU/L — ABNORMAL HIGH (ref 0–32)
AST: 172 IU/L — ABNORMAL HIGH (ref 0–40)
Albumin/Globulin Ratio: 1.2 (ref 1.2–2.2)
Albumin: 3.7 g/dL — ABNORMAL LOW (ref 3.8–4.8)
Alkaline Phosphatase: 527 IU/L — ABNORMAL HIGH (ref 39–117)
BUN/Creatinine Ratio: 17 (ref 12–28)
BUN: 31 mg/dL — ABNORMAL HIGH (ref 8–27)
Bilirubin Total: 0.8 mg/dL (ref 0.0–1.2)
CO2: 21 mmol/L (ref 20–29)
Calcium: 9.6 mg/dL (ref 8.7–10.3)
Chloride: 100 mmol/L (ref 96–106)
Creatinine, Ser: 1.86 mg/dL — ABNORMAL HIGH (ref 0.57–1.00)
GFR calc Af Amer: 32 mL/min/{1.73_m2} — ABNORMAL LOW (ref >59)
GFR calc non Af Amer: 28 mL/min/{1.73_m2} — ABNORMAL LOW (ref >59)
Globulin, Total: 3.1 g/dL (ref 1.5–4.5)
Glucose: 408 mg/dL — ABNORMAL HIGH (ref 65–99)
Potassium: 4.5 mmol/L (ref 3.5–5.2)
Sodium: 136 mmol/L (ref 134–144)
Total Protein: 6.8 g/dL (ref 6.0–8.5)

## 2018-07-29 MED ORDER — SERTRALINE HCL 25 MG PO TABS: 25.0000 mg | ORAL_TABLET | Freq: Every day | ORAL | 1 refills | Status: DC

## 2018-07-29 MED ORDER — INSULIN LISPRO (1 UNIT DIAL) 100 UNIT/ML (KWIKPEN): 5.0000 [IU] | PEN_INJECTOR | Freq: Three times a day (TID) | SUBCUTANEOUS | 0 refills | Status: DC

## 2018-07-29 NOTE — Telephone Encounter (Signed)
Please switch novolog to humalog due to insurance preference.

## 2018-07-30 NOTE — Telephone Encounter (Signed)
Switched

## 2018-08-09 ENCOUNTER — Other Ambulatory Visit: Payer: Self-pay | Admitting: Nurse Practitioner

## 2018-08-09 DIAGNOSIS — R0602 Shortness of breath: Secondary | ICD-10-CM

## 2018-08-09 DIAGNOSIS — M7989 Other specified soft tissue disorders: Secondary | ICD-10-CM

## 2018-08-09 NOTE — Progress Notes (Signed)
Patient called today stating she was experiencing increased shortness of breath with minimal exertion. Will schedule her for EKG in office and have instructed her to decrease her carvedilol to 12.5 mg bid.  She has an appointment with the pharmacist in 2 days already scheduled.  ECHO pending based on EKG results and resolution of shortness of breath.

## 2018-08-11 ENCOUNTER — Other Ambulatory Visit: Payer: Self-pay

## 2018-08-11 ENCOUNTER — Other Ambulatory Visit: Payer: Self-pay | Admitting: Nurse Practitioner

## 2018-08-11 ENCOUNTER — Ambulatory Visit: Payer: Medicare Other | Attending: Family Medicine | Admitting: Pharmacist

## 2018-08-11 ENCOUNTER — Ambulatory Visit: Payer: Medicare Other | Admitting: *Deleted

## 2018-08-11 ENCOUNTER — Encounter: Payer: Self-pay | Admitting: Pharmacist

## 2018-08-11 ENCOUNTER — Encounter: Payer: Medicare Other | Admitting: Nurse Practitioner

## 2018-08-11 VITALS — BP 128/76 | HR 93

## 2018-08-11 DIAGNOSIS — Z79899 Other long term (current) drug therapy: Secondary | ICD-10-CM | POA: Diagnosis not present

## 2018-08-11 DIAGNOSIS — R0602 Shortness of breath: Secondary | ICD-10-CM

## 2018-08-11 DIAGNOSIS — I10 Essential (primary) hypertension: Secondary | ICD-10-CM | POA: Diagnosis present

## 2018-08-11 DIAGNOSIS — E119 Type 2 diabetes mellitus without complications: Secondary | ICD-10-CM | POA: Diagnosis not present

## 2018-08-11 NOTE — Progress Notes (Signed)
   S:    PCP: Zelda  Patient arrives in good spirits.  Presents to the clinic for BP check.  Patient was referred and last seen by Primary Care Provider on 07/28/18. Pt is also getting an EKG performed today before seeing me.   Patient reports adherence with medications but taking carvedilol differently than prescribed. Taking 0.5 tablet of carvedilol 25 mg BID. Sometimes omits evening dose.    Pt denies chest pains, dyspnea, HA or blurred vision. Does endorse lightheadedness.  Current BP Medications include:  Amlodipine 10 mg daily, carvedilol 25 mg BID (taking 12.5 mg BID), losartan 25 mg daily  Dietary habits include: tries to limit salt; admits that she loves bacon; drinks coffee in the morning Exercise habits include:limited since onset of covid-19 pandemic  Family / Social history: never smoker, denies drinking alcohol   O:  L arm after 5 minutes rest: 128/76, HR 93  Home BP readings: reports SBPs in the 120s-130s/70s-80s  Last 3 Office BP readings: BP Readings from Last 3 Encounters:  08/11/18 128/76  07/28/18 129/71  07/15/18 (!) 162/86   BMET    Component Value Date/Time   NA 136 07/28/2018 1643   K 4.5 07/28/2018 1643   CL 100 07/28/2018 1643   CO2 21 07/28/2018 1643   GLUCOSE 408 (H) 07/28/2018 1643   GLUCOSE 199 (H) 07/15/2018 0527   BUN 31 (H) 07/28/2018 1643   CREATININE 1.86 (H) 07/28/2018 1643   CALCIUM 9.6 07/28/2018 1643   GFRNONAA 28 (L) 07/28/2018 1643   GFRAA 32 (L) 07/28/2018 1643    Renal function: Estimated Creatinine Clearance: 33.3 mL/min (A) (by C-G formula based on SCr of 1.86 mg/dL (H)).  Clinical ASCVD: No  The 10-year ASCVD risk score Mikey Bussing DC Jr., et al., 2013) is: 26.7%   Values used to calculate the score:     Age: 68 years     Sex: Female     Is Non-Hispanic African American: Yes     Diabetic: Yes     Tobacco smoker: No     Systolic Blood Pressure: 517 mmHg     Is BP treated: Yes     HDL Cholesterol: 49 mg/dL     Total  Cholesterol: 240 mg/dL   A/P: Hypertension longstanding currently at goal on current medications. BP Goal = <130/80 mmHg. Patient is adherent with current medications.  -Continued current regimen. -Pt to take carvedilol 12.5 mg BID (1/2 of the 25 mg tab)  -EKG results given by PCP/nurse -Counseled on lifestyle modifications for blood pressure control including reduced dietary sodium, increased exercise, adequate sleep  Results reviewed and written information provided. Total time in face-to-face counseling 15 minutes.   F/U Clinic Visit w/ PCP.   Benard Halsted, PharmD, [place] [phone]

## 2018-08-11 NOTE — Progress Notes (Signed)
EKG performed per PCP and patient is aware of receiving a call regarding the results.

## 2018-08-11 NOTE — Patient Instructions (Signed)

## 2018-08-11 NOTE — Patient Instructions (Signed)
PCP Raul Del has reviewed your EKG and will give you a call to discuss the results and next steps. PCP states you are fine to return home and await her call.

## 2018-08-14 ENCOUNTER — Other Ambulatory Visit: Payer: Self-pay | Admitting: Internal Medicine

## 2018-08-14 DIAGNOSIS — I10 Essential (primary) hypertension: Secondary | ICD-10-CM

## 2018-08-17 ENCOUNTER — Ambulatory Visit (HOSPITAL_COMMUNITY)
Admission: RE | Admit: 2018-08-17 | Discharge: 2018-08-17 | Disposition: A | Discharge status: Home / Self Care | Payer: Medicare Other | Source: Ambulatory Visit | Attending: Nurse Practitioner | Admitting: Nurse Practitioner

## 2018-08-17 ENCOUNTER — Other Ambulatory Visit: Payer: Self-pay

## 2018-08-17 DIAGNOSIS — R0602 Shortness of breath: Secondary | ICD-10-CM

## 2018-08-17 DIAGNOSIS — M7989 Other specified soft tissue disorders: Secondary | ICD-10-CM | POA: Diagnosis not present

## 2018-08-17 DIAGNOSIS — E1122 Type 2 diabetes mellitus with diabetic chronic kidney disease: Secondary | ICD-10-CM | POA: Diagnosis not present

## 2018-08-17 DIAGNOSIS — I129 Hypertensive chronic kidney disease with stage 1 through stage 4 chronic kidney disease, or unspecified chronic kidney disease: Secondary | ICD-10-CM | POA: Diagnosis not present

## 2018-08-17 DIAGNOSIS — E119 Type 2 diabetes mellitus without complications: Secondary | ICD-10-CM | POA: Diagnosis not present

## 2018-08-17 DIAGNOSIS — E785 Hyperlipidemia, unspecified: Secondary | ICD-10-CM | POA: Diagnosis not present

## 2018-08-17 DIAGNOSIS — N189 Chronic kidney disease, unspecified: Secondary | ICD-10-CM | POA: Diagnosis not present

## 2018-08-18 ENCOUNTER — Other Ambulatory Visit: Payer: Self-pay | Admitting: Nurse Practitioner

## 2018-08-18 DIAGNOSIS — I517 Cardiomegaly: Secondary | ICD-10-CM

## 2018-09-12 NOTE — Progress Notes (Signed)
Cardiology Office Note:   Date:  09/13/2018  NAME:  Hayley Case    MRN: [id number] DOB:  [birth year]   PCP:  Gildardo Pounds, NP  Cardiologist:  Evalina Field, MD  Electrophysiologist:  None   Referring MD: Gildardo Pounds, NP   Chief Complaint  Patient presents with   Shortness of Breath   History of Present Illness:   Hayley Case is a 68 y.o. female with a hx of hypertension, obesity, diabetes who is being seen today for the evaluation of shortness of breath at the request of Gildardo Pounds, NP.  She presents with a one-month history of progressively worsening shortness of breath with exertion.  She reports a period of inactivity due to coronavirus pandemic.  Prior to the pandemic, she was doing exercises at the senior center and was quite active.  She reports activities such as walking to the mailbox, or walking up a hill really get her out of breath.  Her symptoms are occurring almost daily.  She reports no chest tightness/chest pressure, or palpitations with the episodes.  She does appear to be able to do activities around the house without limitations.  She reports significant stress in her life.  Her son resides with her, and is on home hemodialysis.  She is in charge of connecting him to the machine, and the blood work.  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  She also has recently relocated nearly 2 years ago to [place] from [place] area.  She reports this is been a difficult transition.  She is staying active with friends and socially active via a zoom.  Her most recent A1c is 10.1 and LDL cholesterol 132.  She was admitted in June of this year for a drug-induced liver injury episode.  This was attributed to the combination of antibiotics and Lipitor.  Her Lipitor has been held since that time.  She reports she is had stress test in the past, in [place] prior to moving here.  She has no prior history of myocardial infarction or heart  failure.  A recent echocardiogram shows normal ejection fraction and grade 1 diastolic dysfunction, no increased left atrial pressure.  Her ECG today demonstrates normal sinus rhythm with nonspecific T wave abnormalities.  Past Medical History: Past Medical History:  Diagnosis Date   CKD (chronic kidney disease)    Diabetes mellitus without complication ([place])    Hyperlipidemia    Hypertension     Past Surgical History: Past Surgical History:  Procedure Laterality Date   BREAST BIOPSY     BREAST EXCISIONAL BIOPSY Left     Current Medications: Current Meds  Medication Sig   acetaminophen (TYLENOL) 500 MG tablet Take 500 mg by mouth every 4 (four) hours as needed for headache (pain).   allopurinol (ZYLOPRIM) 100 MG tablet Take 1 tablet (100 mg total) by mouth daily.   amLODipine (NORVASC) 10 MG tablet TAKE 1 TABLET(10 MG) BY MOUTH DAILY   Ascorbic Acid (VITAMIN C PO) Take 1 tablet by mouth 2 (two) times a week.   Blood Glucose Monitoring Suppl (ONETOUCH VERIO) w/Device KIT Use as instructed. Inject into the skin twice daily.   Carboxymethylcellulose Sodium (REFRESH TEARS OP) Place 1 drop into both eyes at bedtime.    carvedilol (COREG) 25 MG tablet Take 1 tablet (25 mg total) by mouth 2 (two) times daily with a meal.   Cholecalciferol (VITAMIN D) 50 MCG (2000 UT) CAPS  Take by mouth.   Elastic Bandages & Supports (MEDICAL COMPRESSION STOCKINGS) MISC Apply to both legs during the day.   fluticasone (FLONASE) 50 MCG/ACT nasal spray Place 2 sprays into both nostrils daily.   glucose blood test strip Use as instructed. Inject into the skin twice daily.   Insulin Glargine (LANTUS SOLOSTAR) 100 UNIT/ML Solostar Pen Inject 40 Units into the skin 2 (two) times daily.   insulin lispro (HUMALOG KWIKPEN) 100 UNIT/ML KwikPen Inject 0.05 mLs (5 Units total) into the skin 3 (three) times daily.   Insulin Pen Needle (B-D UF III MINI PEN NEEDLES) 31G X 5 MM MISC Use as directed.  Check blood glucose levels by fingerstick twice a day.   Lancet Devices (ONE TOUCH DELICA LANCING DEV) MISC Use as instructed. Inject into the skin twice daily   loratadine (CLARITIN) 10 MG tablet Take 10 mg by mouth daily as needed (seasonal allergies).   losartan (COZAAR) 25 MG tablet Take 1 tablet (25 mg total) by mouth daily.   Misc. Devices MISC Please provide patient with insurance approved blood pressure monitor.   sertraline (ZOLOFT) 25 MG tablet Take 1 tablet (25 mg total) by mouth daily.   Vitamin D, Ergocalciferol, (DRISDOL) 1.25 MG (50000 UT) CAPS capsule Take 1 capsule (50,000 Units total) by mouth every 7 (seven) days. (Patient taking differently: Take 50,000 Units by mouth every Friday. )     Allergies:    Lisinopril, Penicillins, and Septra [sulfamethoxazole-trimethoprim]   Social History: Social History   Socioeconomic History   Marital status: Widowed    Spouse name: Not on file   Number of children: Not on file   Years of education: Not on file   Highest education level: Not on file  Occupational History   Not on file  Social Needs   Financial resource strain: Not on file   Food insecurity    Worry: Not on file    Inability: Not on file   Transportation needs    Medical: Not on file    Non-medical: Not on file  Tobacco Use   Smoking status: Never Smoker   Smokeless tobacco: Never Used  Substance and Sexual Activity   Alcohol use: No   Drug use: No   Sexual activity: Not Currently  Lifestyle   Physical activity    Days per week: Not on file    Minutes per session: Not on file   Stress: Not on file  Relationships   Social connections    Talks on phone: Not on file    Gets together: Not on file    Attends religious service: Not on file    Active member of club or organization: Not on file    Attends meetings of clubs or organizations: Not on file    Relationship status: Not on file  Other Topics Concern   Not on file  Social  History Narrative   Not on file     Family History: The patient's family history includes Colon cancer in her sister; Heart attack in her father; Skin cancer in her mother.  ROS:   All other ROS reviewed and negative. Pertinent positives noted in the HPI.     EKGs/Labs/Other Studies Reviewed:   The following studies were personally reviewed by me today: A1c 10.1, LDL cholesterol 132, total cholesterol 240, HDL 49, serum creatinine 1.98  TTE 08/17/2018  1. The left ventricle has normal systolic function with an ejection fraction of 60-65%. The cavity size was normal. There is  mild asymmetric left ventricular hypertrophy. Left ventricular diastolic Doppler parameters are consistent with impaired  relaxation.  2. No evidence of mitral valve stenosis.  3. No stenosis of the aortic valve.  4. The aorta is normal in size and structure.  5. Grossly normal.  EKG:  EKG is ordered today.  The ekg ordered today demonstrates normal sinus rhythm, heart rate 80, normal intervals, nonspecific T wave abnormalities noted in the lateral leads, no prior infarction, and was personally reviewed by me.   Recent Labs: 07/28/2018: ALT 295; BUN 31; Creatinine, Ser 1.86; Hemoglobin 12.2; Platelets 232; Potassium 4.5; Sodium 136   Recent Lipid Panel    Component Value Date/Time   CHOL 240 (H) 05/18/2018 1446   TRIG 296 (H) 05/18/2018 1446   HDL 49 05/18/2018 1446   CHOLHDL 4.9 (H) 05/18/2018 1446   LDLCALC 132 (H) 05/18/2018 1446    Physical Exam:   VS:  BP 108/70    Pulse 80    Temp (!) 96.9 F (36.1 C)    Ht '5\' 4"'$  (1.626 m)    Wt 214 lb 12.8 oz (97.4 kg)    SpO2 99%    BMI 36.87 kg/m    Wt Readings from Last 3 Encounters:  09/13/18 214 lb 12.8 oz (97.4 kg)  07/28/18 215 lb 9.6 oz (97.8 kg)  07/14/18 211 lb 3.2 oz (95.8 kg)    General: Well nourished, well developed, in no acute distress Heart: Atraumatic, normal size  Eyes: PEERLA, EOMI  Neck: Supple, no JVD Endocrine: No thryomegaly Cardiac:  Normal S1, S2; RRR; no murmurs, rubs, or gallops Lungs: Clear to auscultation bilaterally, no wheezing, rhonchi or rales  Abd: Soft, nontender, no hepatomegaly  Ext: No edema, pulses 2+ Musculoskeletal: No deformities, BUE and BLE strength normal and equal Skin: Warm and dry, no rashes   Neuro: Alert and oriented to person, place, time, and situation, CNII-XII grossly intact, no focal deficits  Psych: Normal mood and affect   ASSESSMENT:   NAME@ is a 68 y.o. female who presents for the following: 1. SOB (shortness of breath) on exertion   2. Essential hypertension   3. CKD (chronic kidney disease) stage 4, GFR 15-29 ml/min (HCC)   4. Mixed hyperlipidemia     PLAN:   1. SOB (shortness of breath) on exertion -She presents with 1 month of worsening shortness of breath on exertion.  No chest pain or chest pressure reported.  Her EKG is abnormal with nonspecific T wave abnormalities.  I think it is reasonable to proceed with a Lexiscan nuclear medicine MPI study.  A cardiac CTA would be great however her kidney function is abnormal.  Her echocardiogram demonstrates normal ejection fraction and grade 1 diastolic dysfunction.  There is no evidence of increased filling pressures on that echocardiogram.  Exercise-induced diastolic dysfunction is a possibility, but we will proceed with excluding obstructive coronary disease. She also reports a history of asthma and inactivity.  I think it is reasonable for her to resume activity and to use her albuterol inhaler as needed.  She will follow me in 6 months after test.  2. Essential hypertension -Well-controlled today  3. CKD (chronic kidney disease) stage 4, GFR 15-29 ml/min (HCC) -Blood pressure controlled today, but needs work on A1c (10.1)  4. Mixed hyperlipidemia -She is intolerant of statins due to recent drug-induced liver injury -I will discuss Zetia with her as an option at her next visit   Disposition: Return in about 6 months (around  03/13/2019).  Medication Adjustments/Labs and Tests Ordered: Current medicines are reviewed at length with the patient today.  Concerns regarding medicines are outlined above.  Orders Placed This Encounter  Procedures   Myocardial Perfusion Imaging   EKG 12-Lead   No orders of the defined types were placed in this encounter.   Patient Instructions  Medication Instructions:  Continue same medications   Lab work: None ordered   Testing/Procedures: Schedule Lexiscan Myoview  Follow-Up: At [facility], you and your health needs are our priority.  As part of our continuing mission to provide you with exceptional heart care, we have created designated Provider Care Teams.  These Care Teams include your primary Cardiologist (physician) and Advanced Practice Providers (APPs -  Physician Assistants and Nurse Practitioners) who all work together to provide you with the care you need, when you need it.  Schedule follow up appointment in 6 months   Call in Nov to schedule Feb appointment       Signed, Hayley Case, [place]  [address] [place], [place] [address] [phone]  09/13/2018 10:05 AM

## 2018-09-13 ENCOUNTER — Other Ambulatory Visit: Payer: Self-pay

## 2018-09-13 ENCOUNTER — Encounter: Payer: Self-pay | Admitting: Cardiovascular Disease

## 2018-09-13 ENCOUNTER — Ambulatory Visit (INDEPENDENT_AMBULATORY_CARE_PROVIDER_SITE_OTHER): Payer: Medicare Other | Admitting: Cardiovascular Disease

## 2018-09-13 VITALS — BP 108/70 | HR 80 | Temp 96.9°F | Ht 64.0 in | Wt 214.8 lb

## 2018-09-13 DIAGNOSIS — I10 Essential (primary) hypertension: Secondary | ICD-10-CM

## 2018-09-13 DIAGNOSIS — R0602 Shortness of breath: Secondary | ICD-10-CM

## 2018-09-13 DIAGNOSIS — E782 Mixed hyperlipidemia: Secondary | ICD-10-CM

## 2018-09-13 DIAGNOSIS — N184 Chronic kidney disease, stage 4 (severe): Secondary | ICD-10-CM

## 2018-09-13 MED ORDER — ALBUTEROL SULFATE HFA 108 (90 BASE) MCG/ACT IN AERS: 2.0000 | INHALATION_SPRAY | Freq: Four times a day (QID) | RESPIRATORY_TRACT | 3 refills | Status: DC | PRN

## 2018-09-13 NOTE — Patient Instructions (Signed)
Medication Instructions:  Continue same medications   Lab work: None ordered   Testing/Procedures: Schedule Lexiscan Myoview  Follow-Up: At [facility], you and your health needs are our priority.  As part of our continuing mission to provide you with exceptional heart care, we have created designated Provider Care Teams.  These Care Teams include your primary Cardiologist (physician) and Advanced Practice Providers (APPs -  Physician Assistants and Nurse Practitioners) who all work together to provide you with the care you need, when you need it. . Schedule follow up appointment in 6 months   Call in Nov to schedule Feb appointment

## 2018-09-22 ENCOUNTER — Telehealth (HOSPITAL_COMMUNITY): Payer: Self-pay

## 2018-09-22 NOTE — Telephone Encounter (Signed)
Encounter complete. 

## 2018-09-24 ENCOUNTER — Other Ambulatory Visit: Payer: Self-pay

## 2018-09-24 ENCOUNTER — Ambulatory Visit (HOSPITAL_COMMUNITY)
Admission: RE | Admit: 2018-09-24 | Discharge: 2018-09-24 | Disposition: A | Discharge status: Home / Self Care | Payer: Medicare Other | Source: Ambulatory Visit | Attending: Cardiology | Admitting: Cardiology

## 2018-09-24 DIAGNOSIS — I10 Essential (primary) hypertension: Secondary | ICD-10-CM | POA: Diagnosis not present

## 2018-09-24 DIAGNOSIS — N184 Chronic kidney disease, stage 4 (severe): Secondary | ICD-10-CM | POA: Diagnosis present

## 2018-09-24 DIAGNOSIS — R0602 Shortness of breath: Secondary | ICD-10-CM | POA: Diagnosis not present

## 2018-09-24 DIAGNOSIS — E782 Mixed hyperlipidemia: Secondary | ICD-10-CM | POA: Diagnosis present

## 2018-09-24 LAB — MYOCARDIAL PERFUSION IMAGING
LV dias vol: 61 mL (ref 46–106)
LV sys vol: 24 mL
Peak HR: 88 {beats}/min
Rest HR: 74 {beats}/min
SDS: 1
SRS: 1
SSS: 2
TID: 1.11

## 2018-09-24 MED ORDER — TECHNETIUM TC 99M TETROFOSMIN IV KIT
10.1000 | PACK | Freq: Once | INTRAVENOUS | Status: AC | PRN
  Administered 2018-09-24: 10.1 via INTRAVENOUS
  Filled 2018-09-24: qty 11

## 2018-09-24 MED ORDER — REGADENOSON 0.4 MG/5ML IV SOLN
0.4000 mg | Freq: Once | INTRAVENOUS | Status: AC
  Administered 2018-09-24: 0.4 mg via INTRAVENOUS

## 2018-09-24 MED ORDER — TECHNETIUM TC 99M TETROFOSMIN IV KIT
30.4000 | PACK | Freq: Once | INTRAVENOUS | Status: AC | PRN
  Administered 2018-09-24: 30.4 via INTRAVENOUS
  Filled 2018-09-24: qty 31

## 2018-10-06 ENCOUNTER — Other Ambulatory Visit: Payer: Self-pay | Admitting: Family Medicine

## 2018-11-21 ENCOUNTER — Other Ambulatory Visit: Payer: Self-pay | Admitting: Nurse Practitioner

## 2018-11-21 DIAGNOSIS — Z794 Long term (current) use of insulin: Secondary | ICD-10-CM

## 2018-11-21 DIAGNOSIS — E1122 Type 2 diabetes mellitus with diabetic chronic kidney disease: Secondary | ICD-10-CM

## 2018-11-22 ENCOUNTER — Other Ambulatory Visit: Payer: Self-pay | Admitting: Nurse Practitioner

## 2018-11-22 DIAGNOSIS — M1A9XX0 Chronic gout, unspecified, without tophus (tophi): Secondary | ICD-10-CM

## 2018-11-22 DIAGNOSIS — I10 Essential (primary) hypertension: Secondary | ICD-10-CM

## 2018-11-23 ENCOUNTER — Other Ambulatory Visit: Payer: Self-pay | Admitting: Family Medicine

## 2018-11-23 DIAGNOSIS — I10 Essential (primary) hypertension: Secondary | ICD-10-CM

## 2018-11-23 DIAGNOSIS — M1A9XX0 Chronic gout, unspecified, without tophus (tophi): Secondary | ICD-10-CM

## 2018-11-26 ENCOUNTER — Other Ambulatory Visit: Payer: Self-pay

## 2018-11-30 ENCOUNTER — Other Ambulatory Visit: Payer: Self-pay

## 2018-11-30 ENCOUNTER — Encounter: Payer: Self-pay | Admitting: Internal Medicine

## 2018-11-30 ENCOUNTER — Ambulatory Visit (INDEPENDENT_AMBULATORY_CARE_PROVIDER_SITE_OTHER): Payer: Medicare Other | Admitting: Internal Medicine

## 2018-11-30 VITALS — BP 116/60 | HR 87 | Resp 16 | Ht 64.0 in | Wt 210.0 lb

## 2018-11-30 DIAGNOSIS — E11319 Type 2 diabetes mellitus with unspecified diabetic retinopathy without macular edema: Secondary | ICD-10-CM

## 2018-11-30 DIAGNOSIS — E1169 Type 2 diabetes mellitus with other specified complication: Secondary | ICD-10-CM | POA: Diagnosis not present

## 2018-11-30 DIAGNOSIS — E1121 Type 2 diabetes mellitus with diabetic nephropathy: Secondary | ICD-10-CM

## 2018-11-30 DIAGNOSIS — E1142 Type 2 diabetes mellitus with diabetic polyneuropathy: Secondary | ICD-10-CM

## 2018-11-30 DIAGNOSIS — E785 Hyperlipidemia, unspecified: Secondary | ICD-10-CM | POA: Diagnosis not present

## 2018-11-30 DIAGNOSIS — E1122 Type 2 diabetes mellitus with diabetic chronic kidney disease: Secondary | ICD-10-CM

## 2018-11-30 DIAGNOSIS — Z794 Long term (current) use of insulin: Secondary | ICD-10-CM

## 2018-11-30 DIAGNOSIS — N1832 Chronic kidney disease, stage 3b: Secondary | ICD-10-CM

## 2018-11-30 LAB — POCT GLYCOSYLATED HEMOGLOBIN (HGB A1C): Hemoglobin A1C: 11.8 % — AB (ref 4.0–5.6)

## 2018-11-30 LAB — GLUCOSE, POCT (MANUAL RESULT ENTRY): POC Glucose: 368 mg/dl — AB (ref 70–99)

## 2018-11-30 MED ORDER — PEN NEEDLES 32G X 4 MM MISC: 1.0000 | Freq: Two times a day (BID) | 11 refills | Status: DC

## 2018-11-30 MED ORDER — INSULIN LISPRO PROT & LISPRO (75-25 MIX) 100 UNIT/ML KWIKPEN: 30.0000 [IU] | PEN_INJECTOR | Freq: Two times a day (BID) | SUBCUTANEOUS | 11 refills | Status: DC

## 2018-11-30 NOTE — Patient Instructions (Signed)
-   Stop Lantus  - Stop Humalog  - Start Humalog Mix 30 units with Breakfast and 30 units with Supper     - Check sugar before Breakfast and supper  Choose healthy, lower carb lower calorie snacks: toss salad, cooked vegetables, cottage cheese, peanut butter, low fat cheese / string cheese, lower sodium deli meat, tuna salad or chicken salad    HOW TO TREAT LOW BLOOD SUGARS (Blood sugar LESS THAN 70 MG/DL)  Please follow the RULE OF 15 for the treatment of hypoglycemia treatment (when your (blood sugars are less than 70 mg/dL)    STEP 1: Take 15 grams of carbohydrates when your blood sugar is low, which includes:   3-4 GLUCOSE TABS  OR  3-4 OZ OF JUICE OR REGULAR SODA OR  ONE TUBE OF GLUCOSE GEL     STEP 2: RECHECK blood sugar in 15 MINUTES STEP 3: If your blood sugar is still low at the 15 minute recheck --> then, go back to STEP 1 and treat AGAIN with another 15 grams of carbohydrates.

## 2018-11-30 NOTE — Progress Notes (Signed)
 Name: Hayley Case  MRN/ DOB: [id number], 01/24/1950   Age/ Sex: 68 y.o., female    PCP: Fleming, Zelda W, NP   Reason for Endocrinology Evaluation: Type 2 Diabetes Mellitus     Date of Initial Endocrinology Visit: 11/30/2018     PATIENT IDENTIFIER: Hayley Case is a 68 y.o. female with a past medical history of HTN, T2DM and Dyslipidemia . The patient presented for initial endocrinology clinic visit on 11/30/2018 for consultative assistance with her diabetes management.    HPI: Ms. Piano was    Diagnosed with DM  > 20 yrs  Prior Medications tried/Intolerance: Was on Metformin , trulicity and byetta.  Has been on  insulin ~ 10 yrs agoCurrently checking blood sugars rarely Hypoglycemia episodes : Yes   Symptoms: doesn't feel good     Frequency: often  Hemoglobin A1c has ranged from 7.6 % in 2019, peaking at 10.7 % in 2020. Patient required assistance for hypoglycemia:  no Patient has required hospitalization within the last 1 year from hyper or hypoglycemia:   In terms of diet, the patient eats 2 meals a day, she drinks half and half , snacks twice a day   Moved from [place] to help her son who is on peritoneal dialysis.    Was on Lipitor but caused elevated LFT's    HOME DIABETES REGIMEN: Humalog 5 units BID - takes it once  Lantus 40 units BID   Statin: Off due to elevated LFT's ACE-I/ARB: Yes Prior Diabetic Education: Yes   METER DOWNLOAD SUMMARY: does not check    DIABETIC COMPLICATIONS: Microvascular complications:   CKD III, DR , neuropathy   Last eye exam: Completed > 1 yr   Macrovascular complications:    Denies: CAD, PVD, CVA   PAST HISTORY: Past Medical History:  Past Medical History:  Diagnosis Date  . CKD (chronic kidney disease)   . Diabetes mellitus without complication (HCC)   . Hyperlipidemia   . Hypertension    Past Surgical History:  Past Surgical History:  Procedure Laterality Date  . BREAST BIOPSY    . BREAST  EXCISIONAL BIOPSY Left       Social History:  reports that she has never smoked. She has never used smokeless tobacco. She reports that she does not drink alcohol or use drugs. Family History:  Family History  Problem Relation Age of Onset  . Skin cancer Mother   . Heart attack Father   . Colon cancer Sister      HOME MEDICATIONS: Allergies as of 11/30/2018      Reactions   Lisinopril Cough   Penicillins Hives, Itching   Did it involve swelling of the face/tongue/throat, SOB, or low BP? No Did it involve sudden or severe rash/hives, skin peeling, or any reaction on the inside of your mouth or nose? Yes Did you need to seek medical attention at a hospital or doctor's office? Yes When did it last happen?2012 If all above answers are "NO", may proceed with cephalosporin use.   Septra [sulfamethoxazole-trimethoprim] Hives      Medication List       Accurate as of November 30, 2018  2:51 PM. If you have any questions, ask your nurse or doctor.        STOP taking these medications   hydrOXYzine 10 MG tablet Commonly known as: ATARAX/VISTARIL Stopped by: Ibtehal J Shamleffer, MD     TAKE these medications   acetaminophen 500 MG tablet Commonly known as: TYLENOL Take   500 mg by mouth every 4 (four) hours as needed for headache (pain).   albuterol 108 (90 Base) MCG/ACT inhaler Commonly known as: VENTOLIN HFA Inhale 2 puffs into the lungs every 6 (six) hours as needed for wheezing or shortness of breath.   allopurinol 100 MG tablet Commonly known as: ZYLOPRIM Take 1 tablet (100 mg total) by mouth daily. Must have office visit for refills   amLODipine 10 MG tablet Commonly known as: NORVASC Take 1 tab po daily. Must have PCP visit for refills.   carvedilol 25 MG tablet Commonly known as: COREG Take 1 tablet (25 mg total) by mouth 2 (two) times daily with a meal. What changed: additional instructions   COLACE PO Take by mouth as needed.   fluticasone 50  MCG/ACT nasal spray Commonly known as: FLONASE Place 2 sprays into both nostrils daily.   glucose blood test strip Use as instructed. Inject into the skin twice daily.   HumaLOG KwikPen 100 UNIT/ML KwikPen Generic drug: insulin lispro INJECT 5 UNITS INTO THE SKIN THREE TIMES DAILY   Insulin Pen Needle 31G X 5 MM Misc Commonly known as: B-D UF III MINI PEN NEEDLES Use as directed. Check blood glucose levels by fingerstick twice a day.   Lantus SoloStar 100 UNIT/ML Solostar Pen Generic drug: Insulin Glargine Inject 40 Units into the skin 2 (two) times daily.   loratadine 10 MG tablet Commonly known as: CLARITIN Take 10 mg by mouth daily as needed (seasonal allergies).   losartan 25 MG tablet Commonly known as: COZAAR Take 1 tablet (25 mg total) by mouth daily.   Medical Compression Stockings Misc Apply to both legs during the day.   Misc. Devices Misc Please provide patient with insurance approved blood pressure monitor.   ONE TOUCH DELICA LANCING DEV Misc Use as instructed. Inject into the skin twice daily   OneTouch Verio w/Device Kit Use as instructed. Inject into the skin twice daily.   REFRESH TEARS OP Place 1 drop into both eyes at bedtime.   sertraline 25 MG tablet Commonly known as: ZOLOFT Take 1 tablet (25 mg total) by mouth daily.   VITAMIN C PO Take 1 tablet by mouth 2 (two) times a week.   Vitamin D (Ergocalciferol) 1.25 MG (50000 UT) Caps capsule Commonly known as: DRISDOL Take 1 capsule (50,000 Units total) by mouth every 7 (seven) days. What changed: when to take this        ALLERGIES: Allergies  Allergen Reactions  . Lisinopril Cough  . Penicillins Hives and Itching    Did it involve swelling of the face/tongue/throat, SOB, or low BP? No Did it involve sudden or severe rash/hives, skin peeling, or any reaction on the inside of your mouth or nose? Yes Did you need to seek medical attention at a hospital or doctor's office? Yes When did it  last happen?2012 If all above answers are "NO", may proceed with cephalosporin use.  Sarina Ill [Sulfamethoxazole-Trimethoprim] Hives     REVIEW OF SYSTEMS: A comprehensive ROS was conducted with the patient and is negative except as per HPI and below:  Review of Systems  Constitutional: Negative for chills and fever.  HENT: Negative for congestion and sore throat.   Eyes: Positive for blurred vision. Negative for pain.  Respiratory: Negative for cough and shortness of breath.   Cardiovascular: Negative for chest pain and palpitations.  Gastrointestinal: Negative for nausea and vomiting.  Genitourinary: Negative for frequency.  Neurological: Positive for tingling. Negative for tremors.  Endo/Heme/Allergies:  Negative for polydipsia.  Psychiatric/Behavioral: Negative for depression. The patient is nervous/anxious.       OBJECTIVE:   VITAL SIGNS: BP 116/60   Pulse 87   Resp 16   Ht 5' 4" (1.626 m)   Wt 210 lb (95.3 kg)   SpO2 96%   BMI 36.05 kg/m    PHYSICAL EXAM:  General: Pt appears well and is in NAD  Hydration: Well-hydrated with moist mucous membranes and good skin turgor  HEENT: Head: Unremarkable with good dentition. Oropharynx clear without exudate.  Eyes: External eye exam normal without stare, lid lag or exophthalmos.  EOM intact.   Neck: General: Supple without adenopathy or carotid bruits. Thyroid: Thyroid size normal.  No goiter or nodules appreciated. No thyroid bruit.  Lungs: Clear with good BS bilat with no rales, rhonchi, or wheezes  Heart: RRR with normal S1 and S2 and no gallops; no murmurs; no rub  Abdomen: Normoactive bowel sounds, soft, nontender, without masses or organomegaly palpable  Extremities:  Lower extremities - No pretibial edema. No lesions.  Skin: Normal texture and temperature to palpation. No rash noted. No Acanthosis nigricans/skin tags. No lipohypertrophy.  Neuro: MS is good with appropriate affect, pt is alert and Ox3    DM foot  exam: 11/30/2018  The skin of the feet is intact without sores or ulcerations. The pedal pulses are 2+ on right and 2+ on left. The sensation is intact to a screening 5.07, 10 gram monofilament bilaterally   DATA REVIEWED:  Lab Results  Component Value Date   HGBA1C 11.8 (A) 11/30/2018   HGBA1C 10.1 (H) 05/18/2018   HGBA1C 10.7 (A) 02/16/2018   Lab Results  Component Value Date   LDLCALC 132 (H) 05/18/2018   CREATININE 1.86 (H) 07/28/2018   No results found for: [facility]  Lab Results  Component Value Date   CHOL 240 (H) 05/18/2018   HDL 49 05/18/2018   LDLCALC 132 (H) 05/18/2018   TRIG 296 (H) 05/18/2018   CHOLHDL 4.9 (H) 05/18/2018        In-office BG 368 mg/dl   ASSESSMENT / PLAN / RECOMMENDATIONS:   1) Type 2 Diabetes Mellitus, Poorly controlled, With CKD III, retinopathy and neuropathy complications - Most recent A1c of 11.8 %. Goal A1c < 7.0 %.   Plan: GENERAL:  Poorly controlled diabetes due to medication non-compliance and dietary indiscretions. Her barrier to self care if the stress and anxiety that she endures while caring for her 40 yr son who is on peritoneal dialysis.  I have discussed with the patient the pathophysiology of diabetes. We went over the natural progression of the disease. We talked about both insulin resistance and insulin deficiency. We stressed the importance of lifestyle changes including diet and exercise. I explained the complications associated with diabetes including retinopathy, nephropathy, neuropathy as well as increased risk of cardiovascular disease. We went over the benefit seen with glycemic control.  In her case glycemic control will reduce the progression of microvascular complications  Pt would like the insulin mix as she feels this is more conducive to her lifestyle  We also discussed the importance of checking glucose at home and the availability of this data to me   MEDICATIONS:  Stop Lantus  - Stop Humalog  - Start  Humalog Mix 30 units with Breakfast and 30 units with Supper    EDUCATION / INSTRUCTIONS:  BG monitoring instructions: Patient is instructed to check her blood sugars 2 times a day, fasting and bedtime.  Call [facility] if: BG persistently < 70 or > 300. . I reviewed the Rule of 15 for the treatment of hypoglycemia in detail with the patient. Literature supplied.   2) Diabetic complications:   Eye: Does have known diabetic retinopathy.   Neuro/ Feet: Does have known diabetic peripheral neuropathy.  Renal: Patient does  have known baseline CKD. She is on an ACEI/ARB at present.   3) Lipids: Patient was on statins in the past but this was stopped after her admission with hepatic injury in 06/2018   4) Hypertension: She is at goal of < 140/90 mmHg.    F/U in 3 months    Signed electronically by: Mack Guise, MD  [facility]  [address] [place], [place] [address] Phone: [phone] FAX: [phone]   CC: Gildardo Pounds, NP [place] [address] Phone: [phone]  Fax: [phone]    Return to Endocrinology clinic as below: No future appointments.

## 2018-12-01 DIAGNOSIS — Z794 Long term (current) use of insulin: Secondary | ICD-10-CM | POA: Insufficient documentation

## 2018-12-01 DIAGNOSIS — E1142 Type 2 diabetes mellitus with diabetic polyneuropathy: Secondary | ICD-10-CM | POA: Insufficient documentation

## 2018-12-01 DIAGNOSIS — E11319 Type 2 diabetes mellitus with unspecified diabetic retinopathy without macular edema: Secondary | ICD-10-CM | POA: Insufficient documentation

## 2018-12-16 LAB — HM DIABETES EYE EXAM

## 2018-12-17 ENCOUNTER — Other Ambulatory Visit: Payer: Self-pay | Admitting: Nurse Practitioner

## 2018-12-20 ENCOUNTER — Other Ambulatory Visit: Payer: Self-pay | Admitting: Nurse Practitioner

## 2018-12-20 DIAGNOSIS — I10 Essential (primary) hypertension: Secondary | ICD-10-CM

## 2018-12-21 ENCOUNTER — Other Ambulatory Visit: Payer: Self-pay | Admitting: Nurse Practitioner

## 2018-12-21 ENCOUNTER — Other Ambulatory Visit: Payer: Self-pay | Admitting: Family Medicine

## 2018-12-21 DIAGNOSIS — I10 Essential (primary) hypertension: Secondary | ICD-10-CM

## 2018-12-21 DIAGNOSIS — M1A9XX0 Chronic gout, unspecified, without tophus (tophi): Secondary | ICD-10-CM

## 2018-12-22 ENCOUNTER — Other Ambulatory Visit: Payer: Self-pay | Admitting: Family Medicine

## 2018-12-22 DIAGNOSIS — I10 Essential (primary) hypertension: Secondary | ICD-10-CM

## 2019-01-19 ENCOUNTER — Other Ambulatory Visit: Payer: Self-pay | Admitting: Family Medicine

## 2019-01-19 DIAGNOSIS — I10 Essential (primary) hypertension: Secondary | ICD-10-CM

## 2019-02-03 ENCOUNTER — Telehealth: Payer: Self-pay | Admitting: Internal Medicine

## 2019-02-03 NOTE — Telephone Encounter (Signed)
Patient called to advise that she is taking the medication as prescribed by dr Kelton Pillar, but is still experiencing high blood sugars.  Would like to know how to proceed?

## 2019-02-04 NOTE — Telephone Encounter (Signed)
Attempted to call pt and get blood sugar #'s lft vm to return call to discuss.

## 2019-02-04 NOTE — Telephone Encounter (Signed)
Pt informed of change

## 2019-02-27 ENCOUNTER — Other Ambulatory Visit: Payer: Self-pay | Admitting: Nurse Practitioner

## 2019-02-27 DIAGNOSIS — I10 Essential (primary) hypertension: Secondary | ICD-10-CM

## 2019-02-27 NOTE — Progress Notes (Signed)
Cardiology Office Note:   Date:  03/01/2019  NAME:  Hayley Case    MRN: [id number] DOB:  May 18, 1950   PCP:  Leeroy Cha, MD  Cardiologist:  Evalina Field, MD   Referring MD: Gildardo Pounds, NP   Chief Complaint  Patient presents with  . Shortness of Breath   History of Present Illness:   Hayley Case is a 69 y.o. female with a hx of HTN, DM, obesity who presents for follow-up of SOB. Recent NM stress test normal.  Most recent A1c 12.5.  Most recent total cholesterol 373, HDL 48, LDL 279, triglycerides 218.  She reports that her diabetes is gotten out of control.  She has started on insulin therapy.  She was also prescribed Crestor by her primary care physician.  She has not started yet as she had questions.  She apparently had issues with Lipitor in the past.  I encouraged her to try to take Lipitor we will recheck things in 2 months.  I do suspect much of her hyperlipidemia is related to uncontrolled diabetes.  She does have noticeable carotid bruits on examination.  They were not noticed last time as the main focus of her examination was shortness of breath.  She is never had any blindness or sudden loss of vision.  No uncontrolled headaches.  She seems to be doing well but still gets short of breath with exertion.  She reports anytime she does any heavy activity she does get short of breath.  This also can result in some intermittent chest tightness.  This appears to be stable since her last visit.  I have encouraged her to exercise and remain active.  Problem List 1. HTN 2. Obesity (BMI 36) 3. Diabetes -A1c 11.8 -T cho 240, HDL 49, LDL 132, TG 296  Past Medical History: Past Medical History:  Diagnosis Date  . CKD (chronic kidney disease)   . Diabetes mellitus without complication ([place])   . Hyperlipidemia   . Hypertension     Past Surgical History: Past Surgical History:  Procedure Laterality Date  . BREAST BIOPSY    . BREAST EXCISIONAL BIOPSY Left      Current Medications: Current Meds  Medication Sig  . acetaminophen (TYLENOL) 500 MG tablet Take 500 mg by mouth every 4 (four) hours as needed for headache (pain).  Marland Kitchen albuterol (VENTOLIN HFA) 108 (90 Base) MCG/ACT inhaler Inhale 2 puffs into the lungs every 6 (six) hours as needed for wheezing or shortness of breath.  . allopurinol (ZYLOPRIM) 100 MG tablet Take 1 tablet (100 mg total) by mouth daily. Must have office visit for refills  . amLODipine (NORVASC) 10 MG tablet TAKE 1 TABLET BY MOUTH DAILY  . amLODIPine Benzoate 1 MG/ML SUSP Take 10 mg by mouth.  . Ascorbic Acid (VITAMIN C PO) Take 1 tablet by mouth 2 (two) times a week.  . Blood Glucose Monitoring Suppl (ONETOUCH VERIO) w/Device KIT Use as instructed. Inject into the skin twice daily.  . Carboxymethylcellulose Sodium (REFRESH TEARS OP) Place 1 drop into both eyes at bedtime.   . carvedilol (COREG) 25 MG tablet Take 1 tablet (25 mg total) by mouth 2 (two) times daily with a meal. Must have office visit for refills.  Mariane Baumgarten Sodium (COLACE PO) Take by mouth as needed.  Regino Schultze Bandages & Supports (MEDICAL COMPRESSION STOCKINGS) MISC Apply to both legs during the day.  . ezetimibe (ZETIA) 10 MG tablet Take 10 mg by mouth daily.  Marland Kitchen  fluticasone (FLONASE) 50 MCG/ACT nasal spray Place 2 sprays into both nostrils daily.  Marland Kitchen glucose blood test strip Use as instructed. Inject into the skin twice daily.  . Insulin Lispro Prot & Lispro (HUMALOG MIX 75/25 KWIKPEN) (75-25) 100 UNIT/ML Kwikpen Inject 30 Units into the skin 2 (two) times daily.  . Insulin Pen Needle (PEN NEEDLES) 32G X 4 MM MISC 1 Device by Does not apply route 2 (two) times daily.  Elmore Guise Devices (ONE TOUCH DELICA LANCING DEV) MISC Use as instructed. Inject into the skin twice daily  . loratadine (CLARITIN) 10 MG tablet Take 10 mg by mouth daily as needed (seasonal allergies).  . losartan (COZAAR) 25 MG tablet Take 1 tablet (25 mg total) by mouth daily.  . Misc. Devices  MISC Please provide patient with insurance approved blood pressure monitor.  . Vitamin D, Ergocalciferol, (DRISDOL) 1.25 MG (50000 UT) CAPS capsule TAKE 1 CAPSULE BY MOUTH EVERY 7 DAYS     Allergies:    Lisinopril, Penicillins, and Septra [sulfamethoxazole-trimethoprim]   Social History: Social History   Socioeconomic History  . Marital status: Widowed    Spouse name: Not on file  . Number of children: Not on file  . Years of education: Not on file  . Highest education level: Not on file  Occupational History  . Not on file  Tobacco Use  . Smoking status: Never Smoker  . Smokeless tobacco: Never Used  Substance and Sexual Activity  . Alcohol use: No  . Drug use: No  . Sexual activity: Not Currently  Other Topics Concern  . Not on file  Social History Narrative  . Not on file   Social Determinants of Health   Financial Resource Strain:   . Difficulty of Paying Living Expenses: Not on file  Food Insecurity:   . Worried About Charity fundraiser in the Last Year: Not on file  . Ran Out of Food in the Last Year: Not on file  Transportation Needs:   . Lack of Transportation (Medical): Not on file  . Lack of Transportation (Non-Medical): Not on file  Physical Activity:   . Days of Exercise per Week: Not on file  . Minutes of Exercise per Session: Not on file  Stress:   . Feeling of Stress : Not on file  Social Connections:   . Frequency of Communication with Friends and Family: Not on file  . Frequency of Social Gatherings with Friends and Family: Not on file  . Attends Religious Services: Not on file  . Active Member of Clubs or Organizations: Not on file  . Attends Archivist Meetings: Not on file  . Marital Status: Not on file     Family History: The patient's family history includes Colon cancer in her sister; Heart attack in her father; Skin cancer in her mother.  ROS:   All other ROS reviewed and negative. Pertinent positives noted in the HPI.      EKGs/Labs/Other Studies Reviewed:   The following studies were personally reviewed by me today:  EKG:  EKG is ordered today.  The ekg ordered today demonstrates normal sinus rhythm, heart rate 80, no acute ischemic changes, no evidence of prior infarct, and was personally reviewed by me.   TTE 08/17/2018 1. The left ventricle has normal systolic function with an ejection  fraction of 60-65%. The cavity size was normal. There is mild asymmetric  left ventricular hypertrophy. Left ventricular diastolic Doppler  parameters are consistent with impaired  relaxation.  2. No evidence of mitral valve stenosis.  3. No stenosis of the aortic valve.  4. The aorta is normal in size and structure.  5. Grossly normal.   NM Stress 09/24/2018  The left ventricular ejection fraction is normal (55-65%).  Nuclear stress EF: 61%.  There was no ST segment deviation noted during stress.  The study is normal.  This is a low risk study.     Recent Labs: 07/28/2018: ALT 295; BUN 31; Creatinine, Ser 1.86; Hemoglobin 12.2; Platelets 232; Potassium 4.5; Sodium 136   Recent Lipid Panel    Component Value Date/Time   CHOL 240 (H) 05/18/2018 1446   TRIG 296 (H) 05/18/2018 1446   HDL 49 05/18/2018 1446   CHOLHDL 4.9 (H) 05/18/2018 1446   LDLCALC 132 (H) 05/18/2018 1446    Physical Exam:   VS:  BP 120/70   Pulse 80   Temp (!) 96.7 F (35.9 C)   Ht '5\' 4"'  (1.626 m)   Wt 218 lb 12.8 oz (99.2 kg)   SpO2 99%   BMI 37.56 kg/m    Wt Readings from Last 3 Encounters:  03/01/19 218 lb 12.8 oz (99.2 kg)  11/30/18 210 lb (95.3 kg)  09/24/18 214 lb (97.1 kg)    General: Well nourished, well developed, in no acute distress Heart: Atraumatic, normal size  Eyes: PEERLA, EOMI  Neck: Supple, no JVD, bruit bilaterally  Endocrine: No thryomegaly Cardiac: Normal S1, S2; RRR; no murmurs, rubs, or gallops Lungs: Clear to auscultation bilaterally, no wheezing, rhonchi or rales  Abd: Soft, nontender, no  hepatomegaly  Ext: No edema, pulses 2+ Musculoskeletal: No deformities, BUE and BLE strength normal and equal Skin: Warm and dry, no rashes   Neuro: Alert and oriented to person, place, time, and situation, CNII-XII grossly intact, no focal deficits  Psych: Normal mood and affect   ASSESSMENT:   MADA SADIK is a 69 y.o. female who presents for the following: 1. SOB (shortness of breath) on exertion   2. Essential hypertension   3. Obesity (BMI 30-39.9)   4. Mixed hyperlipidemia   5. Bilateral carotid bruits     PLAN:   1. SOB (shortness of breath) on exertion -No evidence of volume overload today.  Echo from 2020 in August without really significant cardiac abnormality.  Cardiac stress test normal.  I suspect her symptoms are related to deconditioning.  She will try to remain more active.  2. Essential hypertension -Well-controlled today.  No change in medication.  3. Obesity, morbid, BMI 40.0-49.9 (HCC) -Diet and exercise  4. Mixed hyperlipidemia -Severely elevated LDL cholesterol.  I suspect this is related to her uncontrolled diabetes.  Most recent A1c is 12.5.  She will start Crestor 40 mg daily.  Did have an adverse reaction to Lipitor.  We will plan to recheck her levels in 2 months.  I will see her back in 2 months and she will have a lipid profile as well as a CMP done 1 week before that visit.  Hopefully we can get things under control.  She is also on Zetia.  We will continue both.  She may need a PCSK9 inhibitor depending on how we do.  I think getting the diabetes under control and seeing where the dust settles is the best option for now.  5. Bilateral carotid bruits -Bilateral carotid bruits.  Not really noticed on last examination.  C also heck vascular ultrasound  Disposition: Return in about 2 months (around 04/29/2019).  Medication Adjustments/Labs and Tests Ordered: Current medicines are reviewed at length with the patient today.  Concerns regarding medicines  are outlined above.  Orders Placed This Encounter  Procedures  . Comprehensive metabolic panel  . Lipid panel  . EKG 12-Lead  . VAS US CAROTID   No orders of the defined types were placed in this encounter.   Patient Instructions  Medication Instructions:  The current medical regimen is effective;  continue present plan and medications.  *If you need a refill on your cardiac medications before your next appointment, please call your pharmacy*  Lab Work: Lipid and CMP ( fasting one week before follow up appointment) If you have labs (blood work) drawn today and your tests are completely normal, you will receive your results only by: Marland Kitchen MyChart Message (if you have MyChart) OR . A paper copy in the mail If you have any lab test that is abnormal or we need to change your treatment, we will call you to review the results.  Testing/Procedures: Your physician has requested that you have a carotid duplex. This test is an ultrasound of the carotid arteries in your neck. It looks at blood flow through these arteries that supply the brain with blood. Allow one hour for this exam. There are no restrictions or special instructions.  Follow-Up: At [facility], you and your health needs are our priority.  As part of our continuing mission to provide you with exceptional heart care, we have created designated Provider Care Teams.  These Care Teams include your primary Cardiologist (physician) and Advanced Practice Providers (APPs -  Physician Assistants and Nurse Practitioners) who all work together to provide you with the care you need, when you need it.  Your next appointment:   2 month(s)  The format for your next appointment:   In Person  Provider:   Eleonore Chiquito, MD        Signed, Addison Naegeli. Audie Box, [place]  [address], [place], [place] [address] [phone]  03/01/2019 10:35 AM

## 2019-03-01 ENCOUNTER — Other Ambulatory Visit: Payer: Self-pay

## 2019-03-01 ENCOUNTER — Encounter: Payer: Self-pay | Admitting: Cardiovascular Disease

## 2019-03-01 ENCOUNTER — Ambulatory Visit (INDEPENDENT_AMBULATORY_CARE_PROVIDER_SITE_OTHER): Payer: Medicare Other | Admitting: Cardiovascular Disease

## 2019-03-01 VITALS — BP 120/70 | HR 80 | Temp 96.7°F | Ht 64.0 in | Wt 218.8 lb

## 2019-03-01 DIAGNOSIS — E782 Mixed hyperlipidemia: Secondary | ICD-10-CM

## 2019-03-01 DIAGNOSIS — E669 Obesity, unspecified: Secondary | ICD-10-CM

## 2019-03-01 DIAGNOSIS — R0602 Shortness of breath: Secondary | ICD-10-CM

## 2019-03-01 DIAGNOSIS — I10 Essential (primary) hypertension: Secondary | ICD-10-CM

## 2019-03-01 DIAGNOSIS — R0989 Other specified symptoms and signs involving the circulatory and respiratory systems: Secondary | ICD-10-CM

## 2019-03-01 NOTE — Patient Instructions (Addendum)
Medication Instructions:  The current medical regimen is effective;  continue present plan and medications.  *If you need a refill on your cardiac medications before your next appointment, please call your pharmacy*  Lab Work: Lipid and CMP ( fasting one week before follow up appointment) If you have labs (blood work) drawn today and your tests are completely normal, you will receive your results only by: Marland Kitchen MyChart Message (if you have MyChart) OR . A paper copy in the mail If you have any lab test that is abnormal or we need to change your treatment, we will call you to review the results.  Testing/Procedures: Your physician has requested that you have a carotid duplex. This test is an ultrasound of the carotid arteries in your neck. It looks at blood flow through these arteries that supply the brain with blood. Allow one hour for this exam. There are no restrictions or special instructions.  Follow-Up: At [facility], you and your health needs are our priority.  As part of our continuing mission to provide you with exceptional heart care, we have created designated Provider Care Teams.  These Care Teams include your primary Cardiologist (physician) and Advanced Practice Providers (APPs -  Physician Assistants and Nurse Practitioners) who all work together to provide you with the care you need, when you need it.  Your next appointment:   2 month(s)  The format for your next appointment:   In Person  Provider:   Eleonore Chiquito, MD

## 2019-03-04 ENCOUNTER — Other Ambulatory Visit: Payer: Self-pay

## 2019-03-08 ENCOUNTER — Other Ambulatory Visit: Payer: Self-pay

## 2019-03-08 ENCOUNTER — Encounter: Payer: Self-pay | Admitting: Internal Medicine

## 2019-03-08 ENCOUNTER — Ambulatory Visit (INDEPENDENT_AMBULATORY_CARE_PROVIDER_SITE_OTHER): Payer: Medicare Other | Admitting: Internal Medicine

## 2019-03-08 VITALS — BP 122/72 | HR 77 | Temp 98.5°F | Ht 64.0 in | Wt 219.0 lb

## 2019-03-08 DIAGNOSIS — E11319 Type 2 diabetes mellitus with unspecified diabetic retinopathy without macular edema: Secondary | ICD-10-CM | POA: Diagnosis not present

## 2019-03-08 DIAGNOSIS — Z794 Long term (current) use of insulin: Secondary | ICD-10-CM

## 2019-03-08 DIAGNOSIS — E1121 Type 2 diabetes mellitus with diabetic nephropathy: Secondary | ICD-10-CM | POA: Diagnosis not present

## 2019-03-08 DIAGNOSIS — E1122 Type 2 diabetes mellitus with diabetic chronic kidney disease: Secondary | ICD-10-CM

## 2019-03-08 DIAGNOSIS — E1142 Type 2 diabetes mellitus with diabetic polyneuropathy: Secondary | ICD-10-CM | POA: Diagnosis not present

## 2019-03-08 DIAGNOSIS — N1832 Chronic kidney disease, stage 3b: Secondary | ICD-10-CM | POA: Diagnosis not present

## 2019-03-08 MED ORDER — INSULIN LISPRO PROT & LISPRO (75-25 MIX) 100 UNIT/ML KWIKPEN: PEN_INJECTOR | SUBCUTANEOUS | 3 refills | Status: DC

## 2019-03-08 NOTE — Patient Instructions (Signed)
-   Humalog Mix 36 units with Breakfast and 32 units with Supper     - Check sugar before Breakfast and supper  Choose healthy, lower carb lower calorie snacks: toss salad, cooked vegetables, cottage cheese, peanut butter, low fat cheese / string cheese, lower sodium deli meat, tuna salad or chicken salad    HOW TO TREAT LOW BLOOD SUGARS (Blood sugar LESS THAN 70 MG/DL)  Please follow the RULE OF 15 for the treatment of hypoglycemia treatment (when your (blood sugars are less than 70 mg/dL)    STEP 1: Take 15 grams of carbohydrates when your blood sugar is low, which includes:   3-4 GLUCOSE TABS  OR  3-4 OZ OF JUICE OR REGULAR SODA OR  ONE TUBE OF GLUCOSE GEL     STEP 2: RECHECK blood sugar in 15 MINUTES STEP 3: If your blood sugar is still low at the 15 minute recheck --> then, go back to STEP 1 and treat AGAIN with another 15 grams of carbohydrates.

## 2019-03-08 NOTE — Progress Notes (Signed)
Name: Hayley Case  Age/ Sex: 69 y.o., female   MRN/ DOB: [id number], 22-Nov-1950     PCP: Leeroy Cha, MD   Reason for Endocrinology Evaluation: Type 2 Diabetes Mellitus  Initial Endocrine Consultative Visit: 11/30/2018    PATIENT IDENTIFIER: Hayley Case is a 69 y.o. female with a past medical history of HTN, T2DM and Dyslipidemia . The patient has followed with Endocrinology clinic since 11/30/2018 for consultative assistance with management of her diabetes.  DIABETIC HISTORY:  Hayley Case was diagnosed with T2DM > 20 yrs ago. She was on metformin, Trulicity, and Byetta. Has been on insulin since ~ 2010.  Her hemoglobin A1c has ranged from 7.6 % in 2019, peaking at 10.7 % in 2020.   On her initial visit to our clinic, she had an A1c of 11.8%. She was on lantus/humalog but was not taking regularly, switched to insulin mix for ease of take.    Was on Lipitor but caused elevated LFT's requiring hospitalization.  Moved from [place] to help her son who is on peritoneal dialysis. Has lost another son to MI   SUBJECTIVE:   During the last visit (11/30/2018): A1c 11.8%, switched MDi regimen to insulin mix.  Today (03/08/2019): Hayley Case is here for a follow up on diabetes.  She checks her blood sugars 1 times daily, preprandial to breakfast. The patient has had hypoglycemic episodes since the last clinic visit, which typically occur 1 x / month- most often occuring in the morning . The patient is symptomatic with these episodes. She has been taking her evening dose at bedtime  Rather then supper time. But takes the morning dose before breakfast    HOME DIABETES REGIMEN:  Humalog Mix 30 units BID - has been taking 36 units   Statin: Off due to elevated LFT's ACE-I/ARB: Yes    METER DOWNLOAD SUMMARY: Date range evaluated: 2/10-2/23/2021 Fingerstick Blood Glucose Tests = 16 Average Number Tests/Day = 1.1 Overall Mean FS Glucose = 204  BG Ranges: Low =  73 High = 370   Hypoglycemic Events/30 Days: BG < 50 =0 Episodes of symptomatic severe hypoglycemia = 0   DIABETIC COMPLICATIONS: Microvascular complications:   CKD III, DR , neuropathy   Last eye exam: Completed > 1 yr   Macrovascular complications:    Denies: CAD, PVD, CVA   HISTORY:  Past Medical History:  Past Medical History:  Diagnosis Date  . CKD (chronic kidney disease)   . Diabetes mellitus without complication ([place])   . Hyperlipidemia   . Hypertension    Past Surgical History:  Past Surgical History:  Procedure Laterality Date  . BREAST BIOPSY    . BREAST EXCISIONAL BIOPSY Left     Social History:  reports that she has never smoked. She has never used smokeless tobacco. She reports that she does not drink alcohol or use drugs. Family History:  Family History  Problem Relation Age of Onset  . Skin cancer Mother   . Heart attack Father   . Colon cancer Sister      HOME MEDICATIONS: Allergies as of 03/08/2019      Reactions   Lisinopril Cough   Penicillins Hives, Itching   Did it involve swelling of the face/tongue/throat, SOB, or low BP? No Did it involve sudden or severe rash/hives, skin peeling, or any reaction on the inside of your mouth or nose? Yes Did you need to seek medical attention at a hospital or doctor's office? Yes When did it last  happen?2012 If all above answers are "NO", may proceed with cephalosporin use.   Septra [sulfamethoxazole-trimethoprim] Hives      Medication List       Accurate as of March 08, 2019  9:38 AM. If you have any questions, ask your nurse or doctor.        STOP taking these medications   Medical Compression Stockings Misc Stopped by: Dorita Sciara, MD   sertraline 25 MG tablet Commonly known as: ZOLOFT Stopped by: Dorita Sciara, MD     TAKE these medications   acetaminophen 500 MG tablet Commonly known as: TYLENOL Take 500 mg by mouth every 4 (four) hours as needed for  headache (pain).   albuterol 108 (90 Base) MCG/ACT inhaler Commonly known as: VENTOLIN HFA Inhale 2 puffs into the lungs every 6 (six) hours as needed for wheezing or shortness of breath.   allopurinol 100 MG tablet Commonly known as: ZYLOPRIM Take 1 tablet (100 mg total) by mouth daily. Must have office visit for refills   amLODipine 10 MG tablet Commonly known as: NORVASC TAKE 1 TABLET BY MOUTH DAILY   amLODIPine Benzoate 1 MG/ML Susp Take 10 mg by mouth.   carvedilol 25 MG tablet Commonly known as: COREG Take 1 tablet (25 mg total) by mouth 2 (two) times daily with a meal. Must have office visit for refills.   COLACE PO Take by mouth as needed.   ezetimibe 10 MG tablet Commonly known as: ZETIA Take 10 mg by mouth daily.   fluticasone 50 MCG/ACT nasal spray Commonly known as: FLONASE Place 2 sprays into both nostrils daily.   glucose blood test strip Use as instructed. Inject into the skin twice daily.   Insulin Lispro Prot & Lispro (75-25) 100 UNIT/ML Kwikpen Commonly known as: HumaLOG Mix 75/25 KwikPen Inject 30 Units into the skin 2 (two) times daily. What changed: how much to take   loratadine 10 MG tablet Commonly known as: CLARITIN Take 10 mg by mouth daily as needed (seasonal allergies).   losartan 25 MG tablet Commonly known as: COZAAR Take 1 tablet (25 mg total) by mouth daily.   Misc. Devices Misc Please provide patient with insurance approved blood pressure monitor.   ONE TOUCH DELICA LANCING DEV Misc Use as instructed. Inject into the skin twice daily   OneTouch Verio w/Device Kit Use as instructed. Inject into the skin twice daily.   Pen Needles 32G X 4 MM Misc 1 Device by Does not apply route 2 (two) times daily.   REFRESH TEARS OP Place 1 drop into both eyes at bedtime.   VITAMIN C PO Take 1 tablet by mouth 2 (two) times a week.   Vitamin D (Ergocalciferol) 1.25 MG (50000 UNIT) Caps capsule Commonly known as: DRISDOL TAKE 1 CAPSULE  BY MOUTH EVERY 7 DAYS        OBJECTIVE:   Vital Signs: BP 122/72 (BP Location: Left Arm, Patient Position: Sitting, Cuff Size: Large)   Pulse 77   Temp 98.5 F (36.9 C)   Ht '5\' 4"'  (1.626 m)   Wt 219 lb (99.3 kg)   SpO2 97%   BMI 37.59 kg/m   Wt Readings from Last 3 Encounters:  03/08/19 219 lb (99.3 kg)  03/01/19 218 lb 12.8 oz (99.2 kg)  11/30/18 210 lb (95.3 kg)     Exam: General: Pt appears well and is in NAD  Lungs: Clear with good BS bilat with no rales, rhonchi, or wheezes  Heart: RRR with  normal S1 and S2 and no gallops; no murmurs; no rub  Extremities: Trace pretibial edema.  Neuro: MS is good with appropriate affect, pt is alert and Ox3       DM foot exam: 11/30/2018  The skin of the feet is intact without sores or ulcerations. The pedal pulses are 2+ on right and 2+ on left. The sensation is intact to a screening 5.07, 10 gram monofilament bilaterally    DATA REVIEWED:  Lab Results  Component Value Date   HGBA1C 11.8 (A) 11/30/2018   HGBA1C 10.1 (H) 05/18/2018   HGBA1C 10.7 (A) 02/16/2018   Lab Results  Component Value Date   LDLCALC 132 (H) 05/18/2018   CREATININE 1.86 (H) 07/28/2018   No results found for: [facility]   Lab Results  Component Value Date   CHOL 240 (H) 05/18/2018   HDL 49 05/18/2018   LDLCALC 132 (H) 05/18/2018   TRIG 296 (H) 05/18/2018   CHOLHDL 4.9 (H) 05/18/2018         ASSESSMENT / PLAN / RECOMMENDATIONS:   1) Type 2 Diabetes Mellitus, Poorly controlled, With CKD III, retinopathy and neuropathic complications - Most recent A1c of 12.5 %. Goal A1c < 7.5 %.    -A1c done at an outside facility showing worsening of her A1c at 12.5% on 02/17/2019 -In review of her glucose meter download today she does check consistently in the mornings and most of her BG readings have been below 100 she even had an episode of hypoglycemia with a BG of 41 mg/DL.  There are not a lot of data at evening times, which could be the times  where she has severe hypoglycemia pushing her A1c to go higher. -She is also been noted to take her evening dose at bedtime rather than suppertime.  This could explain her risk for hypoglycemia in the mornings and tighter BGs. -I have encouraged her to continue glucose checks and insulin intake before breakfast, but I also have advised her to start taking her evening dose with supper rather than bedtime to decrease the risk of hypoglycemia.  MEDICATIONS:  Continue Humalog mix at 36 units with breakfast, decrease Humalog mix to 32 units with supper  EDUCATION / INSTRUCTIONS:  BG monitoring instructions: Patient is instructed to check her blood sugars 2 times a day, before breakfast and before supper.  Call [facility] if: BG persistently < 70 or > 300. . I reviewed the Rule of 15 for the treatment of hypoglycemia in detail with the patient. Literature supplied.   F/U in 3 months    Signed electronically by: Mack Guise, MD  [facility]  [facility] [address] [place], [place] [address] Phone: [phone] FAX: [phone]   CC: Leeroy Cha, [place] [place] [address] Phone: [phone]  Fax: [phone]  Return to Endocrinology clinic as below: Future Appointments  Date Time Provider [place]  03/09/2019  3:00 PM [facility] VASC 1 [facility]  05/02/2019 10:20 AM O'Neal, Cassie Freer, MD [facility]

## 2019-03-09 ENCOUNTER — Other Ambulatory Visit (HOSPITAL_COMMUNITY): Payer: Self-pay | Admitting: Cardiovascular Disease

## 2019-03-09 ENCOUNTER — Ambulatory Visit: Payer: Self-pay | Admitting: *Deleted

## 2019-03-09 ENCOUNTER — Telehealth: Payer: Self-pay

## 2019-03-09 ENCOUNTER — Ambulatory Visit (HOSPITAL_COMMUNITY)
Admission: RE | Admit: 2019-03-09 | Discharge: 2019-03-09 | Disposition: A | Discharge status: Home / Self Care | Payer: Medicare Other | Source: Ambulatory Visit | Attending: Cardiovascular Disease | Admitting: Cardiovascular Disease

## 2019-03-09 DIAGNOSIS — I6522 Occlusion and stenosis of left carotid artery: Secondary | ICD-10-CM

## 2019-03-09 DIAGNOSIS — R0989 Other specified symptoms and signs involving the circulatory and respiratory systems: Secondary | ICD-10-CM

## 2019-03-09 NOTE — Telephone Encounter (Signed)
I returned her call and left her a voicemail to call back which she did.  She is scheduled to get her first COVID-19 vaccine on 03/11/2019.  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    She was wanting to know if she needed to quarantine from him after getting the vaccine.  I let her know she did not because the vaccine does not contain any COVID virus in it.    She denies being exposed to anyone with COVID-19 recently.    She asked me what the side effects were from the vaccine.   Using CDC guidelines I let her know the side effects can be low grade fever, body aches, sore arm at the injection site, headache, fatigue.  Most of the symptoms if they occur happen after the 2nd dose.   I also let her know they could happen after the first dose or not at all after either dose.    She asked if it would affect her liver.   I let her know not that I was aware of or had read about.    She thanked me very much for answering her questions and making her feel more at ease about getting the vaccine.   Reason for Disposition . Health Information question, no triage required and triager able to answer question  Answer Assessment - Initial Assessment Questions 1. REASON FOR CALL or QUESTION: "What is your reason for calling today?" or "How can I best help you?" or "What question do you have that I can help answer?"     See documentation note.  Protocols used: INFORMATION ONLY CALL - NO TRIAGE-A-AH

## 2019-03-09 NOTE — Telephone Encounter (Signed)
Pt has questions about vaccine. She is scheduled for an app on 2/26. Her son does home dialisys and she wants to know if she will need to quarantine from him for a period of time. Will have a nurse reach back out.   [place]

## 2019-03-10 ENCOUNTER — Other Ambulatory Visit: Payer: Self-pay

## 2019-03-11 ENCOUNTER — Ambulatory Visit: Payer: Medicare Other | Attending: Internal Medicine

## 2019-03-11 DIAGNOSIS — Z23 Encounter for immunization: Secondary | ICD-10-CM | POA: Insufficient documentation

## 2019-03-11 NOTE — Progress Notes (Signed)
   Covid-19 Vaccination Clinic  Name:  Hayley Case    MRN: [id number] DOB: 1950/04/06  03/11/2019  [name] was observed post Covid-19 immunization for 30 minutes based on pre-vaccination screening without incidence. She was provided with Vaccine Information Sheet and instruction to access the V-Safe system.   [name] was instructed to call 911 with any severe reactions post vaccine: Marland Kitchen Difficulty breathing  . Swelling of your face and throat  . A fast heartbeat  . A bad rash all over your body  . Dizziness and weakness    Immunizations Administered    Name Date Dose VIS Date Route   Pfizer COVID-19 Vaccine 03/11/2019  9:58 AM 0.3 mL 12/24/2018 Intramuscular   Manufacturer: [place]   Lot: [id number]   [place]: [id number]

## 2019-04-05 ENCOUNTER — Ambulatory Visit: Payer: Medicare Other | Attending: Internal Medicine

## 2019-04-05 DIAGNOSIS — Z23 Encounter for immunization: Secondary | ICD-10-CM

## 2019-04-05 NOTE — Progress Notes (Signed)
   Covid-19 Vaccination Clinic  Name:  Hayley Case    MRN: [id number] DOB: [birth year]  04/05/2019  Hayley Case was observed post Covid-19 immunization for 30 minutes based on pre-vaccination screening without incident. She was provided with Vaccine Information Sheet and instruction to access the V-Safe system.   Hayley Case was instructed to call 911 with any severe reactions post vaccine: Marland Kitchen Difficulty breathing  . Swelling of face and throat  . A fast heartbeat  . A bad rash all over body  . Dizziness and weakness   Immunizations Administered    Name Date Dose VIS Date Route   Pfizer COVID-19 Vaccine 04/05/2019 11:17 AM 0.3 mL 12/24/2018 Intramuscular   Manufacturer: [place]   Lot: [id number]   [place]: [id number]

## 2019-04-28 ENCOUNTER — Telehealth: Payer: Self-pay | Admitting: Internal Medicine

## 2019-04-28 NOTE — Telephone Encounter (Signed)
Patient called stating her sugars are running high and would like the Dr or nurse to give her a call back regarding this. Ph# [phone]

## 2019-04-28 NOTE — Telephone Encounter (Signed)
Correction humalog mix.

## 2019-04-28 NOTE — Telephone Encounter (Signed)
Pt stated that her blood sugars have been running high since her last OV in February. She stated that she just had eye surgery and can not read off any current readings but she knows that she has had some readings in the 300's. Pt currently on humalog 36 units with breakfast and 32 units with dinner. Please advise.

## 2019-04-29 ENCOUNTER — Other Ambulatory Visit: Payer: Self-pay

## 2019-04-29 MED ORDER — INSULIN LISPRO PROT & LISPRO (75-25 MIX) 100 UNIT/ML KWIKPEN: PEN_INJECTOR | SUBCUTANEOUS | 3 refills | Status: DC

## 2019-04-29 NOTE — Telephone Encounter (Signed)
Pt informed of change, rx updated and refills sent

## 2019-05-02 ENCOUNTER — Ambulatory Visit: Payer: Medicare Other | Admitting: Cardiovascular Disease

## 2019-05-07 ENCOUNTER — Telehealth: Payer: Self-pay | Admitting: Internal Medicine

## 2019-05-07 LAB — COMPREHENSIVE METABOLIC PANEL
ALT: 26 IU/L (ref 0–32)
AST: 17 IU/L (ref 0–40)
Albumin/Globulin Ratio: 1.2 (ref 1.2–2.2)
Albumin: 3.6 g/dL — ABNORMAL LOW (ref 3.8–4.8)
Alkaline Phosphatase: 212 IU/L — ABNORMAL HIGH (ref 39–117)
BUN/Creatinine Ratio: 14 (ref 12–28)
BUN: 30 mg/dL — ABNORMAL HIGH (ref 8–27)
Bilirubin Total: 0.5 mg/dL (ref 0.0–1.2)
CO2: 22 mmol/L (ref 20–29)
Calcium: 9.3 mg/dL (ref 8.7–10.3)
Chloride: 102 mmol/L (ref 96–106)
Creatinine, Ser: 2.17 mg/dL — ABNORMAL HIGH (ref 0.57–1.00)
GFR calc Af Amer: 26 mL/min/{1.73_m2} — ABNORMAL LOW (ref >59)
GFR calc non Af Amer: 23 mL/min/{1.73_m2} — ABNORMAL LOW (ref >59)
Globulin, Total: 2.9 g/dL (ref 1.5–4.5)
Glucose: 520 mg/dL (ref 65–99)
Potassium: 4.9 mmol/L (ref 3.5–5.2)
Sodium: 137 mmol/L (ref 134–144)
Total Protein: 6.5 g/dL (ref 6.0–8.5)

## 2019-05-07 LAB — LIPID PANEL
Chol/HDL Ratio: 3.3 ratio (ref 0.0–4.4)
Cholesterol, Total: 114 mg/dL (ref 100–199)
HDL: 35 mg/dL — ABNORMAL LOW (ref >39)
LDL Chol Calc (NIH): 54 mg/dL (ref 0–99)
Triglycerides: 145 mg/dL (ref 0–149)
VLDL Cholesterol Cal: 25 mg/dL (ref 5–40)

## 2019-05-07 NOTE — Telephone Encounter (Signed)
Received a call from [place] that [name]'s blood glucose was ~520 on her BMP. Reviewed her chart and found that [name] has had long-standing, poorly-controlled diabetes and is on medical therapy for this. Will await the remainder of her BMP results and alert Dr. Audie Box.

## 2019-05-15 NOTE — Progress Notes (Signed)
Cardiology Office Note:   Date:  05/17/2019  NAME:  Hayley Case    MRN: [id number] DOB:  1950-06-02   PCP:  Leeroy Cha, MD  Cardiologist:  Evalina Field, MD   Referring MD: Leeroy Cha,*   Chief Complaint  Patient presents with  . Follow-up   History of Present Illness:   Hayley Case is a 69 y.o. female with a hx of DM, HTN, obesity who presents for follow-up of carotid artery disease/HLD. Evaluated for SOB in the past with normal echo and normal stress test. Recent carotid US with mild to moderate ICA disease. LDL now at goal.   She reports has been doing fairly well.  Her most recent serum glucose value was over 500.  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  She reports that she is felt a bit lightheaded and dizzy over the past few weeks.  When she takes all 3 of her blood pressure medication she does get a little fatigued and dizzy.  When she only takes 2 of them she seems to do well.  Apparently avoiding carvedilol helps her immensely.  I did check her blood sugar in office today and it was 276.  Blood pressure is well controlled otherwise, 115/70 today.  She denies any symptoms of chest pain or shortness of breath.  She is recently diagnosed with sleep apnea and is working to get her sleep machine.  She recently had cataract surgery.  No signs of symptoms or stroke reported.  She does report significant stress in her life she does help her son do home dialysis.  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  She reports that she does not exercise routinely but this does give her enough activity for which she reports is acceptable.  Her recent insulin has been adjusted.  She will continue to work with her endocrinologist to improve her diabetes.  Most recent A1c 12.5.  Problem List 1. HTN 2. Obesity (BMI 36) 3. Diabetes -A1c 12.5 -T chol 114, HDL 35, LDL 54, TG 145 4. Carotid artery disease 03/09/2019 -R ICA  1-39% -L ICA 40-59% 5. OSA  Past Medical History: Past Medical History:  Diagnosis Date  . CKD (chronic kidney disease)   . Diabetes mellitus without complication ([place])   . Hyperlipidemia   . Hypertension     Past Surgical History: Past Surgical History:  Procedure Laterality Date  . BREAST BIOPSY    . BREAST EXCISIONAL BIOPSY Left     Current Medications: Current Meds  Medication Sig  . acetaminophen (TYLENOL) 500 MG tablet Take 500 mg by mouth every 4 (four) hours as needed for headache (pain).  Marland Kitchen albuterol (VENTOLIN HFA) 108 (90 Base) MCG/ACT inhaler Inhale 2 puffs into the lungs every 6 (six) hours as needed for wheezing or shortness of breath.  . allopurinol (ZYLOPRIM) 100 MG tablet Take 1 tablet (100 mg total) by mouth daily. Must have office visit for refills  . amLODipine (NORVASC) 10 MG tablet TAKE 1 TABLET BY MOUTH DAILY  . amLODIPine Benzoate 1 MG/ML SUSP Take 10 mg by mouth.  . Ascorbic Acid (VITAMIN C PO) Take 1 tablet by mouth 2 (two) times a week.  . Blood Glucose Monitoring Suppl (ONETOUCH VERIO) w/Device KIT Use as instructed. Inject into the skin twice daily.  . Carboxymethylcellulose Sodium (REFRESH TEARS OP) Place 1 drop into both eyes at bedtime.   Mariane Baumgarten Sodium (COLACE PO) Take by  mouth as needed.  . ezetimibe (ZETIA) 10 MG tablet Take 1 tablet (10 mg total) by mouth daily.  . fluticasone (FLONASE) 50 MCG/ACT nasal spray Place 2 sprays into both nostrils daily.  Marland Kitchen glucose blood test strip Use as instructed. Inject into the skin twice daily.  . Insulin Lispro Prot & Lispro (HUMALOG MIX 75/25 KWIKPEN) (75-25) 100 UNIT/ML Kwikpen Inject 40 Units into the skin daily with breakfast AND 36 Units daily with supper.  . Insulin Pen Needle (PEN NEEDLES) 32G X 4 MM MISC 1 Device by Does not apply route 2 (two) times daily.  Elmore Guise Devices (ONE TOUCH DELICA LANCING DEV) MISC Use as instructed. Inject into the skin twice daily  . loratadine (CLARITIN) 10 MG tablet  Take 10 mg by mouth daily as needed (seasonal allergies).  . losartan (COZAAR) 25 MG tablet Take 1 tablet (25 mg total) by mouth daily.  . Misc. Devices MISC Please provide patient with insurance approved blood pressure monitor.  . Vitamin D, Ergocalciferol, (DRISDOL) 1.25 MG (50000 UT) CAPS capsule TAKE 1 CAPSULE BY MOUTH EVERY 7 DAYS  . [DISCONTINUED] carvedilol (COREG) 25 MG tablet Take 1 tablet (25 mg total) by mouth 2 (two) times daily with a meal. Must have office visit for refills.  . [DISCONTINUED] ezetimibe (ZETIA) 10 MG tablet Take 10 mg by mouth daily.     Allergies:    Lisinopril, Lipitor [atorvastatin], Penicillins, and Septra [sulfamethoxazole-trimethoprim]   Social History: Social History   Socioeconomic History  . Marital status: Widowed    Spouse name: Not on file  . Number of children: Not on file  . Years of education: Not on file  . Highest education level: Not on file  Occupational History  . Not on file  Tobacco Use  . Smoking status: Never Smoker  . Smokeless tobacco: Never Used  Substance and Sexual Activity  . Alcohol use: No  . Drug use: No  . Sexual activity: Not Currently  Other Topics Concern  . Not on file  Social History Narrative  . Not on file   Social Determinants of Health   Financial Resource Strain:   . Difficulty of Paying Living Expenses:   Food Insecurity:   . Worried About Charity fundraiser in the Last Year:   . Arboriculturist in the Last Year:   Transportation Needs:   . Film/video editor (Medical):   Marland Kitchen Lack of Transportation (Non-Medical):   Physical Activity:   . Days of Exercise per Week:   . Minutes of Exercise per Session:   Stress:   . Feeling of Stress :   Social Connections:   . Frequency of Communication with Friends and Family:   . Frequency of Social Gatherings with Friends and Family:   . Attends Religious Services:   . Active Member of Clubs or Organizations:   . Attends Archivist Meetings:    Marland Kitchen Marital Status:      Family History: The patient's family history includes Colon cancer in her sister; Heart attack in her father; Skin cancer in her mother.  ROS:   All other ROS reviewed and negative. Pertinent positives noted in the HPI.     EKGs/Labs/Other Studies Reviewed:   The following studies were personally reviewed by me today:  TTE 08/17/2018 1. The left ventricle has normal systolic function with an ejection  fraction of 60-65%. The cavity size was normal. There is mild asymmetric  left ventricular hypertrophy. Left ventricular diastolic  Doppler  parameters are consistent with impaired  relaxation.  2. No evidence of mitral valve stenosis.  3. No stenosis of the aortic valve.  4. The aorta is normal in size and structure.  5. Grossly normal.   NM Stress 09/24/2018  The left ventricular ejection fraction is normal (55-65%).  Nuclear stress EF: 61%.  There was no ST segment deviation noted during stress.  The study is normal.  This is a low risk study.  Recent Labs: 07/28/2018: Hemoglobin 12.2; Platelets 232 05/06/2019: ALT 26; BUN 30; Creatinine, Ser 2.17; Potassium 4.9; Sodium 137   Recent Lipid Panel    Component Value Date/Time   CHOL 114 05/06/2019 1222   TRIG 145 05/06/2019 1222   HDL 35 (L) 05/06/2019 1222   CHOLHDL 3.3 05/06/2019 1222   LDLCALC 54 05/06/2019 1222    Physical Exam:   VS:  BP 115/70   Pulse 74   Temp (!) 97.1 F (36.2 C)   Ht 5' 4" (1.626 m)   Wt 210 lb (95.3 kg)   SpO2 95%   BMI 36.05 kg/m    Wt Readings from Last 3 Encounters:  05/17/19 210 lb (95.3 kg)  03/08/19 219 lb (99.3 kg)  03/01/19 218 lb 12.8 oz (99.2 kg)    General: Well nourished, well developed, in no acute distress Heart: Atraumatic, normal size  Eyes: PEERLA, EOMI  Neck: Bilateral carotid bruits, left greater than right Endocrine: No thryomegaly Cardiac: Normal S1, S2; RRR; no murmurs, rubs, or gallops Lungs: Clear to auscultation  bilaterally, no wheezing, rhonchi or rales  Abd: Soft, nontender, no hepatomegaly  Ext: No edema, pulses 2+ Musculoskeletal: No deformities, BUE and BLE strength normal and equal Skin: Warm and dry, no rashes   Neuro: Alert and oriented to person, place, time, and situation, CNII-XII grossly intact, no focal deficits  Psych: Normal mood and affect   ASSESSMENT:   Hayley Case is a 69 y.o. female who presents for the following: 1. Bilateral carotid artery stenosis   2. Mixed hyperlipidemia   3. Essential hypertension     PLAN:   1. Bilateral carotid artery stenosis -She has significant stenosis in the left carotid artery 40-59%.  No symptoms of stroke.  I recommended continue aspirin and good cholesterol control.  We recently rechallenge her on Crestor 40 mg daily and she is tolerating this well.  She will continue Zetia 10 mg daily as well. -She does need to work on her diabetes, given A1c of 12.5.  She will work on this with her endocrinologist.  2. Mixed hyperlipidemia -Most recent LDL cholesterol is at goal.  She will continue her current medications.  3. Essential hypertension -She reports a bit of fatigue and dizziness when taking all 3 blood pressure medications.  We will hold her carvedilol.  She will continue her Norvasc 10 mg daily as well as losartan 25 mg daily.  She is going to be treated for sleep apnea and this will help her blood pressure.  Her blood pressure is 115/70 which is probably a little too low for her.  Disposition: Return in about 6 months (around 11/17/2019).  Medication Adjustments/Labs and Tests Ordered: Current medicines are reviewed at length with the patient today.  Concerns regarding medicines are outlined above.  No orders of the defined types were placed in this encounter.  Meds ordered this encounter  Medications  . ezetimibe (ZETIA) 10 MG tablet    Sig: Take 1 tablet (10 mg total) by mouth daily.  Dispense:  90 tablet    Refill:  1  .  rosuvastatin (CRESTOR) 40 MG tablet    Sig: Take 1 tablet (40 mg total) by mouth daily.    Dispense:  90 tablet    Refill:  3    Patient Instructions  Medication Instructions:  Stop Carvedilol  Continue Crestor 40 mg daily  Continue Zetia 10 mg daily  *If you need a refill on your cardiac medications before your next appointment, please call your pharmacy*   Follow-Up: At [facility], you and your health needs are our priority.  As part of our continuing mission to provide you with exceptional heart care, we have created designated Provider Care Teams.  These Care Teams include your primary Cardiologist (physician) and Advanced Practice Providers (APPs -  Physician Assistants and Nurse Practitioners) who all work together to provide you with the care you need, when you need it.  We recommend signing up for the patient portal called "MyChart".  Sign up information is provided on this After Visit Summary.  MyChart is used to connect with patients for Virtual Visits (Telemedicine).  Patients are able to view lab/test results, encounter notes, upcoming appointments, etc.  Non-urgent messages can be sent to your provider as well.   To learn more about what you can do with MyChart, go to [email or web address].    Your next appointment:   6 month(s)  The format for your next appointment:   In Person  Provider:   Eleonore Chiquito, MD        Time Spent with Patient: I have spent a total of 35 minutes with patient reviewing hospital notes, telemetry, EKGs, labs and examining the patient as well as establishing an assessment and plan that was discussed with the patient.  > 50% of time was spent in direct patient care.  Signed, Addison Naegeli. Audie Box, [place]  [address], [place], [place] [address] [phone]  05/17/2019 2:44 PM

## 2019-05-16 ENCOUNTER — Other Ambulatory Visit: Payer: Self-pay | Admitting: Internal Medicine

## 2019-05-16 DIAGNOSIS — Z1231 Encounter for screening mammogram for malignant neoplasm of breast: Secondary | ICD-10-CM

## 2019-05-17 ENCOUNTER — Encounter: Payer: Self-pay | Admitting: Cardiovascular Disease

## 2019-05-17 ENCOUNTER — Ambulatory Visit (INDEPENDENT_AMBULATORY_CARE_PROVIDER_SITE_OTHER): Payer: Medicare Other | Admitting: Cardiovascular Disease

## 2019-05-17 ENCOUNTER — Other Ambulatory Visit: Payer: Self-pay

## 2019-05-17 VITALS — BP 115/70 | HR 74 | Temp 97.1°F | Ht 64.0 in | Wt 210.0 lb

## 2019-05-17 DIAGNOSIS — I6523 Occlusion and stenosis of bilateral carotid arteries: Secondary | ICD-10-CM

## 2019-05-17 DIAGNOSIS — E782 Mixed hyperlipidemia: Secondary | ICD-10-CM | POA: Diagnosis not present

## 2019-05-17 DIAGNOSIS — I10 Essential (primary) hypertension: Secondary | ICD-10-CM | POA: Diagnosis not present

## 2019-05-17 MED ORDER — EZETIMIBE 10 MG PO TABS: 10.0000 mg | ORAL_TABLET | Freq: Every day | ORAL | 1 refills | Status: DC

## 2019-05-17 MED ORDER — ROSUVASTATIN CALCIUM 40 MG PO TABS: 40.0000 mg | ORAL_TABLET | Freq: Every day | ORAL | 3 refills | Status: DC

## 2019-05-17 NOTE — Patient Instructions (Signed)
Medication Instructions:  Stop Carvedilol  Continue Crestor 40 mg daily  Continue Zetia 10 mg daily  *If you need a refill on your cardiac medications before your next appointment, please call your pharmacy*   Follow-Up: At [facility], you and your health needs are our priority.  As part of our continuing mission to provide you with exceptional heart care, we have created designated Provider Care Teams.  These Care Teams include your primary Cardiologist (physician) and Advanced Practice Providers (APPs -  Physician Assistants and Nurse Practitioners) who all work together to provide you with the care you need, when you need it.  We recommend signing up for the patient portal called "MyChart".  Sign up information is provided on this After Visit Summary.  MyChart is used to connect with patients for Virtual Visits (Telemedicine).  Patients are able to view lab/test results, encounter notes, upcoming appointments, etc.  Non-urgent messages can be sent to your provider as well.   To learn more about what you can do with MyChart, go to [email or web address].    Your next appointment:   6 month(s)  The format for your next appointment:   In Person  Provider:   Eleonore Chiquito, MD

## 2019-06-07 ENCOUNTER — Other Ambulatory Visit: Payer: Self-pay

## 2019-06-07 ENCOUNTER — Encounter: Payer: Self-pay | Admitting: Internal Medicine

## 2019-06-07 ENCOUNTER — Ambulatory Visit: Payer: Medicare Other | Admitting: Internal Medicine

## 2019-06-07 VITALS — BP 110/62 | HR 102 | Temp 98.3°F | Ht 64.0 in | Wt 205.8 lb

## 2019-06-07 DIAGNOSIS — N1832 Chronic kidney disease, stage 3b: Secondary | ICD-10-CM

## 2019-06-07 DIAGNOSIS — E1122 Type 2 diabetes mellitus with diabetic chronic kidney disease: Secondary | ICD-10-CM

## 2019-06-07 DIAGNOSIS — E1121 Type 2 diabetes mellitus with diabetic nephropathy: Secondary | ICD-10-CM | POA: Diagnosis not present

## 2019-06-07 DIAGNOSIS — Z794 Long term (current) use of insulin: Secondary | ICD-10-CM | POA: Diagnosis not present

## 2019-06-07 LAB — POCT GLYCOSYLATED HEMOGLOBIN (HGB A1C): Hemoglobin A1C: 12.2 % — AB (ref 4.0–5.6)

## 2019-06-07 MED ORDER — INSULIN LISPRO 100 UNIT/ML ~~LOC~~ SOLN: SUBCUTANEOUS | 11 refills | Status: DC

## 2019-06-07 MED ORDER — INSULIN LISPRO PROT & LISPRO (75-25 MIX) 100 UNIT/ML KWIKPEN: PEN_INJECTOR | SUBCUTANEOUS | 3 refills | Status: DC

## 2019-06-07 MED ORDER — INSULIN SYRINGES (DISPOSABLE) U-100 1 ML MISC: 1.0000 | Freq: Every day | 11 refills | Status: DC

## 2019-06-07 NOTE — Patient Instructions (Signed)
-   Humalog Mix 44 units with Breakfast and 36 units with Supper   - I will refer you to [name] for V-Go pump training. YOu will need to be on regular Humalog ( not humalog mix then)     HOW TO TREAT LOW BLOOD SUGARS (Blood sugar LESS THAN 70 MG/DL)  Please follow the RULE OF 15 for the treatment of hypoglycemia treatment (when your (blood sugars are less than 70 mg/dL)    STEP 1: Take 15 grams of carbohydrates when your blood sugar is low, which includes:   3-4 GLUCOSE TABS  OR  3-4 OZ OF JUICE OR REGULAR SODA OR  ONE TUBE OF GLUCOSE GEL     STEP 2: RECHECK blood sugar in 15 MINUTES STEP 3: If your blood sugar is still low at the 15 minute recheck --> then, go back to STEP 1 and treat AGAIN with another 15 grams of carbohydrates.

## 2019-06-07 NOTE — Progress Notes (Signed)
Name: Hayley Case  Age/ Sex: 69 y.o., female   MRN/ DOB: [id number], [birth year]     PCP: Leeroy Cha, MD   Reason for Endocrinology Evaluation: Type 2 Diabetes Mellitus  Initial Endocrine Consultative Visit: 11/30/2018    PATIENT IDENTIFIER: Hayley Case is a 69 y.o. female with a past medical history of HTN, T2DM and Dyslipidemia . The patient has followed with Endocrinology clinic since 11/30/2018 for consultative assistance with management of her diabetes.  DIABETIC HISTORY:  Hayley Case was diagnosed with T2DM > 20 yrs ago. She was on metformin, Trulicity, and Byetta. Has been on insulin since ~ 2010.  Her hemoglobin A1c has ranged from 7.6 % in 2019, peaking at 10.7 % in 2020.   On her initial visit to our clinic, she had an A1c of 11.8%. She was on lantus/humalog but was not taking regularly, switched to insulin mix for ease of take.    Was on Lipitor but caused elevated LFT's requiring hospitalization.  Moved from [place] to help her son who is on peritoneal dialysis. Has lost another son to MI   SUBJECTIVE:   During the last visit (03/08/2019): A1c 12.5 %, Adjusted Humalog mix.   Today (06/07/2019): Hayley Case is here for a follow up on diabetes.  She checks her blood sugars 1 times daily, preprandial to breakfast. The patient has had hypoglycemic episodes since the last clinic visit, which typically occur 1 x / month- most often occuring in the morning . The patient is symptomatic with these episodes. She has been taking her evening dose at bedtime  Rather then supper time. But takes the morning dose before breakfast    Last night she had dinner at 7 pm, had chicken, corn and mashed potatoes with green sweet tea, followed by a snack of peanut butter and crackers as well as water melo   HOME DIABETES REGIMEN:  Humalog Mix 36 ( taking 40)units with breakfast and 32 ( taking 46) units with supper    Statin: Off due to elevated LFT's ACE-I/ARB:  Yes    METER DOWNLOAD SUMMARY: Date range evaluated: 5/12- 06/07/2019 Fingerstick Blood Glucose Tests = 15 Average Number Tests/Day = 1.1 Overall Mean FS Glucose = 326  BG Ranges: Low = 94 High = HI   Hypoglycemic Events/30 Days: BG < 50 =0 Episodes of symptomatic severe hypoglycemia = 0   DIABETIC COMPLICATIONS: Microvascular complications:   CKD III, DR , neuropathy   Last eye exam: Completed > 1 yr   Macrovascular complications:    Denies: CAD, PVD, CVA   HISTORY:  Past Medical History:  Past Medical History:  Diagnosis Date  . CKD (chronic kidney disease)   . Diabetes mellitus without complication ([place])   . Hyperlipidemia   . Hypertension    Past Surgical History:  Past Surgical History:  Procedure Laterality Date  . BREAST BIOPSY    . BREAST EXCISIONAL BIOPSY Left     Social History:  reports that she has never smoked. She has never used smokeless tobacco. She reports that she does not drink alcohol or use drugs. Family History:  Family History  Problem Relation Age of Onset  . Skin cancer Mother   . Heart attack Father   . Colon cancer Sister      HOME MEDICATIONS: Allergies as of 06/07/2019      Reactions   Lisinopril Cough   Lipitor [atorvastatin] Other (See Comments)   Liver function   Penicillins Hives, Itching  Did it involve swelling of the face/tongue/throat, SOB, or low BP? No Did it involve sudden or severe rash/hives, skin peeling, or any reaction on the inside of your mouth or nose? Yes Did you need to seek medical attention at a hospital or doctor's office? Yes When did it last happen?2012 If all above answers are "NO", may proceed with cephalosporin use.   Septra [sulfamethoxazole-trimethoprim] Hives      Medication List       Accurate as of Jun 07, 2019  8:35 AM. If you have any questions, ask your nurse or doctor.        acetaminophen 500 MG tablet Commonly known as: TYLENOL Take 500 mg by mouth every 4  (four) hours as needed for headache (pain).   albuterol 108 (90 Base) MCG/ACT inhaler Commonly known as: VENTOLIN HFA Inhale 2 puffs into the lungs every 6 (six) hours as needed for wheezing or shortness of breath.   allopurinol 100 MG tablet Commonly known as: ZYLOPRIM Take 1 tablet (100 mg total) by mouth daily. Must have office visit for refills   amLODipine 10 MG tablet Commonly known as: NORVASC TAKE 1 TABLET BY MOUTH DAILY   amLODIPine Benzoate 1 MG/ML Susp Take 10 mg by mouth.   COLACE PO Take by mouth as needed.   ezetimibe 10 MG tablet Commonly known as: ZETIA Take 1 tablet (10 mg total) by mouth daily.   fluticasone 50 MCG/ACT nasal spray Commonly known as: FLONASE Place 2 sprays into both nostrils daily.   glucose blood test strip Use as instructed. Inject into the skin twice daily.   Insulin Lispro Prot & Lispro (75-25) 100 UNIT/ML Kwikpen Commonly known as: HumaLOG Mix 75/25 KwikPen Inject 40 Units into the skin daily with breakfast AND 36 Units daily with supper.   loratadine 10 MG tablet Commonly known as: CLARITIN Take 10 mg by mouth daily as needed (seasonal allergies).   losartan 25 MG tablet Commonly known as: COZAAR Take 1 tablet (25 mg total) by mouth daily.   Misc. Devices Misc Please provide patient with insurance approved blood pressure monitor.   ONE TOUCH DELICA LANCING DEV Misc Use as instructed. Inject into the skin twice daily   OneTouch Verio w/Device Kit Use as instructed. Inject into the skin twice daily.   Pen Needles 32G X 4 MM Misc 1 Device by Does not apply route 2 (two) times daily.   REFRESH TEARS OP Place 1 drop into both eyes at bedtime.   rosuvastatin 40 MG tablet Commonly known as: CRESTOR Take 1 tablet (40 mg total) by mouth daily.   VITAMIN C PO Take 1 tablet by mouth 2 (two) times a week.   Vitamin D (Ergocalciferol) 1.25 MG (50000 UNIT) Caps capsule Commonly known as: DRISDOL TAKE 1 CAPSULE BY MOUTH EVERY  7 DAYS        OBJECTIVE:   Vital Signs: BP 110/62 (BP Location: Left Arm, Patient Position: Sitting, Cuff Size: Large)   Pulse (!) 102   Temp 98.3 F (36.8 C)   Ht '5\' 4"'  (1.626 m)   Wt 205 lb 12.8 oz (93.4 kg)   SpO2 98%   BMI 35.33 kg/m   Wt Readings from Last 3 Encounters:  06/07/19 205 lb 12.8 oz (93.4 kg)  05/17/19 210 lb (95.3 kg)  03/08/19 219 lb (99.3 kg)     Exam: General: Pt appears well and is in NAD  Lungs: Clear with good BS bilat with no rales, rhonchi, or wheezes  Heart: RRR with normal S1 and S2 and no gallops; no murmurs; no rub  Extremities: Trace pretibial edema.  Neuro: MS is good with appropriate affect, pt is alert and Ox3       DM foot exam: 11/30/2018  The skin of the feet is intact without sores or ulcerations. The pedal pulses are 2+ on right and 2+ on left. The sensation is intact to a screening 5.07, 10 gram monofilament bilaterally    DATA REVIEWED:  Lab Results  Component Value Date   HGBA1C 11.8 (A) 11/30/2018   HGBA1C 10.1 (H) 05/18/2018   HGBA1C 10.7 (A) 02/16/2018   Lab Results  Component Value Date   LDLCALC 54 05/06/2019   CREATININE 2.17 (H) 05/06/2019     Lab Results  Component Value Date   CHOL 114 05/06/2019   HDL 35 (L) 05/06/2019   LDLCALC 54 05/06/2019   TRIG 145 05/06/2019   CHOLHDL 3.3 05/06/2019         ASSESSMENT / PLAN / RECOMMENDATIONS:   1) Type 2 Diabetes Mellitus, Poorly controlled, With CKD III, retinopathy and neuropathic complications - Most recent A1c of 12.2 %. Goal A1c < 7.5 %.    - Pt continues with hyperglycemia, this is mainly due to dietary indiscretions. She does have unpredictable pattern of eating, sometimes her fasting BG are as low as 94 mg.dL and other times fasting is between 200-300 mg.dL.  - This am Bg was 362 mg/dL, last night for dinner she had chicken, corn and mashed potatoes, then at night she snacked on peanut butter and crackers followed by watermelon.  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  - Initially I switched her  MDI regimen to insulin mix for BID dosing, but this regimen is not working for her. I gave her the option of going back on MDI regimen vs trying V-GO insulin pump  -- I will refer her to our CDE for pump training  - In the meantime will adjust insulin mix as below  - She was advised if she goes on the V-Go, will need to be on PLAIN HUMALOG and NOT the humalog mix ( she was provided with a written prescription for humalog vial and syringes)   MEDICATIONS:  Humalog mix 44 units with breakfast,  And 36 units with supper   - V-GO 20 4 units with each meal and 2 units with snacks   EDUCATION / INSTRUCTIONS:  BG monitoring instructions: Patient is instructed to check her blood sugars 2 times a day, before breakfast and before supper.  Call [facility] if: BG persistently < 70 or > 300. . I reviewed the Rule of 15 for the treatment of hypoglycemia in detail with the patient. Literature supplied.   F/U in 4 months    Signed electronically by: Mack Guise, MD  [facility]  [facility] [address] [place], [place] [address] Phone: [phone] FAX: [phone]   CC: Leeroy Cha, [place] [place] [address] Phone: [phone]  Fax: [phone]  Return to Endocrinology clinic as below: Future Appointments  Date Time Provider [place]  06/21/2019 10:10 AM GI-[facility] MM 3 GI-[facility] GI-[facility]

## 2019-06-09 ENCOUNTER — Other Ambulatory Visit: Payer: Self-pay | Admitting: Nurse Practitioner

## 2019-06-20 ENCOUNTER — Encounter: Payer: Medicare Other | Attending: Internal Medicine | Admitting: Nutrition

## 2019-06-20 ENCOUNTER — Other Ambulatory Visit: Payer: Self-pay

## 2019-06-20 DIAGNOSIS — E1142 Type 2 diabetes mellitus with diabetic polyneuropathy: Secondary | ICD-10-CM | POA: Diagnosis present

## 2019-06-20 DIAGNOSIS — E1165 Type 2 diabetes mellitus with hyperglycemia: Secondary | ICD-10-CM | POA: Diagnosis present

## 2019-06-20 DIAGNOSIS — Z794 Long term (current) use of insulin: Secondary | ICD-10-CM | POA: Diagnosis present

## 2019-06-21 ENCOUNTER — Other Ambulatory Visit: Payer: Self-pay

## 2019-06-21 ENCOUNTER — Ambulatory Visit
Admission: RE | Admit: 2019-06-21 | Discharge: 2019-06-21 | Disposition: A | Discharge status: Home / Self Care | Payer: Medicare Other | Source: Ambulatory Visit | Attending: Internal Medicine | Admitting: Internal Medicine

## 2019-06-21 DIAGNOSIS — Z1231 Encounter for screening mammogram for malignant neoplasm of breast: Secondary | ICD-10-CM

## 2019-06-21 NOTE — Patient Instructions (Signed)
Call to let me know if you would like to try this.   [phone].  Review handouts on V-Go and carbohydrates.  Call if questions

## 2019-06-21 NOTE — Progress Notes (Signed)
Patient was identified by name and DOB.  She is here today to learn about "other ways to give insulin and her diet".  She says that her day is non-stop: taking care of her son who is on home dialysis for 10 hours every day,  Her meals are not balanced and inconsistant with carbs and timing. Insulin dose: Humalog Mix:  44u 9:30AM          36u 7-10PM:  ususally after the meal Typical day: 9AM: takes insulin and eats within 5 min.  Juice(sometimes),  2 bacon, 1 egg, coffee with cream and sugar, sometimes hash browns.   1-2PM: sandwich with chips, or salad, or something from supper the night before, sweetened tea.  4PM: peanut butter crackers, or fruit, or regular jello with peaches 5-9PM: 4 ounces protein, 1 non starchy veg., and 1 starchy veg (usually 3-grams of carb), occasional bread 9-10: snack, 1 bag of skinny pop corn, or 2-3  cookies, or ice cream.    10PM bed  Discussed how her current insulin works, and that it is not timed with when she is eating, causing high blood sugars  Discussed also how/what carbohydrates are, and how they affect blood sugars.  She was shown the variability of her carbs daily, causing variablity in her blood sugar readings.   We discussed how the V-go works, how to fill it, and use it, and how it will deliver insulin,( if bolusing before the meals), when her blood sugars are rising.  She also wanted information of other insulin pumps, but decided this was more effort than she is willing to give at this time. We discussed what carbs are, and serving sizes and the need to adjust humalog dose/button presses, based on how many carbs she is eating. She was given a list of all carbs with 15 gram portions sizes.  We reviewed this.  She reported good understanding of this, and says she would like to try this.  She wanted to find out first how much this would cost her, before trying this.   She filled out insurance investigation form and it was faxed to [employer].   She was told  that she will get a call within a week, to let her know how much this will cost her each month.  If she wants to try this, she will call me to arrange a training for the free 6 day sample.  She was given my number to call, if she wants to do this.   She had no final questions.

## 2019-06-29 ENCOUNTER — Telehealth: Payer: Self-pay | Admitting: Nutrition

## 2019-06-29 NOTE — Telephone Encounter (Signed)
Message left to call me to schedule V-Go start

## 2019-07-13 ENCOUNTER — Telehealth: Payer: Self-pay

## 2019-07-13 ENCOUNTER — Other Ambulatory Visit: Payer: Self-pay

## 2019-07-13 ENCOUNTER — Telehealth: Payer: Self-pay | Admitting: Nutrition

## 2019-07-13 ENCOUNTER — Encounter: Payer: Medicare Other | Admitting: Nutrition

## 2019-07-13 DIAGNOSIS — Z794 Long term (current) use of insulin: Secondary | ICD-10-CM

## 2019-07-13 DIAGNOSIS — E1165 Type 2 diabetes mellitus with hyperglycemia: Secondary | ICD-10-CM | POA: Diagnosis not present

## 2019-07-13 DIAGNOSIS — E1142 Type 2 diabetes mellitus with diabetic polyneuropathy: Secondary | ICD-10-CM

## 2019-07-13 NOTE — Progress Notes (Addendum)
Patient was trained on how to fill,apply and use the V-Go.  She was given and V-Go 20 starter kit with 6 samples and information on how to fill, apply and use the V-G. Lot# [id number]  Exp. 9/22.  We reviewed how to fill, apply and use this V-Go and she filled 5 V-Gos with Humalog insulin.  She took her 70/30 insulin this AM, did not want to start this before supper tonight, so will begin this at [facility] tomorrow morning .  She practiced using the button presses correctly on the fake V-Go, and reverbalized the need to take 2 button presses (4u) before meals and 1 button press before her bedtime snack.  She verbalized understanding that each button press delivers 2u of insulin.  She was told to test her blood sugars before meals and at bedtime for the next 2 weeks and call the office with blood sugar readings.  She asked if she could get a [place], because she does not like testing so many times.  She was given a Libre 2, and shown how to use this. She attached the sensor to her left arm and started it.  We reviewed the difference between sensor readings and blood sugar readings, and what the arrows mean.  She had no final questions, and felt confident that she can use both the V-Go and Libre without problems. She was told that she will get an EMail from the clinic to link her readings to ours, and how to do this.  I put her into the [place], and sent her an email with directions to link her readings to [place]. She had no final questions.

## 2019-07-13 NOTE — Telephone Encounter (Cosign Needed)
Medication Sample . Pt came into office and picked up sample of Humalog U100 vial per Dr. Quin Hoop instructions on 07/13/19.  . Medication was signed out according to sample medication policy.  . Patient came to get sample at instruction of L.Spagnola,CDE and verified that Dr. Kelton Pillar was okay with this by T. Glenis Smoker. . Medication was labeled using approved label. . Medication sample was added to patient's medication list as a sample medication that had been given. . Medication was explained to patient and patient verbalized understanding of how to take properly.  . Pt was also instructed to call this office if any questions or concerns arise. Pt did verbalize understanding of this.  Lot number for medication is: [id number]  Expiration for medication is: 07/13/2019

## 2019-07-13 NOTE — Patient Instructions (Addendum)
Stop Humalog Mix insulin after supper tonight Fill and apply a new V-Go every 24 hours starting tomorrow Take 2 button presses before meals and 1 button press before a bedtime snack.

## 2019-07-13 NOTE — Telephone Encounter (Signed)
Message was left on machine to stop by DR. Shamleffer's office to pick up a vial of Humalog insulin

## 2019-07-14 ENCOUNTER — Telehealth: Payer: Self-pay | Admitting: Nutrition

## 2019-07-14 NOTE — Telephone Encounter (Signed)
Pt stated that [place] is on the highway headed to [place] and does not have her meter with her but it has just been reading Hi. She also stated that she has been clicking with each meal and snack as advised.

## 2019-07-14 NOTE — Telephone Encounter (Signed)
Patient was contacted to see if she had any difficulty starting her V-Go this AM, and to remind her not to take any more insulin injections.   She reported that she started this at [place] today without any difficulty and took no insulin injections.  She reported that her FBS today was 384 from her [place], and she took her 2 button presses before breakfast without any difficulty.  She wast tod to do a fingerstick to confirm the high reading, and to call Dr. Kelton Pillar for further instructions.  She agreed to do this.

## 2019-07-14 NOTE — Telephone Encounter (Signed)
I do not see any documentation of pt being on a pump. Would you like for me to schedule her an appt? Please advise

## 2019-07-14 NOTE — Telephone Encounter (Signed)
Patient called stating she has started using her new Pump and her sugars are still running high. Please advise. Ph# [phone]

## 2019-07-14 NOTE — Telephone Encounter (Signed)
Pt informed

## 2019-07-19 ENCOUNTER — Other Ambulatory Visit: Payer: Self-pay | Admitting: Internal Medicine

## 2019-07-19 ENCOUNTER — Telehealth: Payer: Self-pay | Admitting: Nutrition

## 2019-07-19 MED ORDER — INSULIN LISPRO PROT & LISPRO (75-25 MIX) 100 UNIT/ML KWIKPEN: PEN_INJECTOR | SUBCUTANEOUS | 6 refills | Status: DC

## 2019-07-19 NOTE — Telephone Encounter (Signed)
Patient said she had a missed call from our office and was returning the call. Ph# [phone]

## 2019-07-19 NOTE — Telephone Encounter (Signed)
Patient reports that she has just put on her last V-go.  She likes it, and reports that her blood sugars are "much lower--in the low 200s", but says it will cost her $90/00 every month and can not afford this.  She wants to go back on injections, but wants to work with me to try to get better blood sugar control.  She says she is willing to try any other insulin, that is not too expensive, and take as many injections as needed What do you want her to do?

## 2019-07-19 NOTE — Telephone Encounter (Signed)
Please advise 

## 2019-07-20 ENCOUNTER — Telehealth: Payer: Self-pay | Admitting: Internal Medicine

## 2019-07-20 ENCOUNTER — Other Ambulatory Visit: Payer: Self-pay

## 2019-07-20 MED ORDER — FREESTYLE LIBRE 2 SENSOR MISC: 6 refills | Status: DC

## 2019-07-20 NOTE — Telephone Encounter (Signed)
sent 

## 2019-07-20 NOTE — Telephone Encounter (Signed)
Pt informed, pt also stated that she was placed on the [place] and that she will need sensors. Wanted to verify this before placing it on her med list and sending refills.

## 2019-07-20 NOTE — Telephone Encounter (Signed)
I gave her a sample when I started her on the V-Go.  She likes it.  She does not need a reader, just the [place] 2 sensors--2 per month.

## 2019-07-20 NOTE — Telephone Encounter (Signed)
Per [facility] "Caller states she missed Tashia's call and she is needing for her to return the call-caller will leave 2 phone numbers-her number # [phone] you can call her son cell# [phone]"

## 2019-07-20 NOTE — Telephone Encounter (Signed)
Spoke to pt this morning per phone note.

## 2019-07-26 ENCOUNTER — Other Ambulatory Visit: Payer: Self-pay

## 2019-07-26 MED ORDER — FREESTYLE LIBRE 2 SENSOR MISC: 6 refills | Status: DC

## 2019-08-01 ENCOUNTER — Telehealth: Payer: Self-pay | Admitting: Internal Medicine

## 2019-08-01 MED ORDER — INSULIN LISPRO PROT & LISPRO (75-25 MIX) 100 UNIT/ML KWIKPEN: 52.0000 [IU] | PEN_INJECTOR | Freq: Two times a day (BID) | SUBCUTANEOUS | 11 refills | Status: DC

## 2019-08-01 NOTE — Telephone Encounter (Signed)
Please let her know that I have look up the CGM download and based on that information, I suggest continue Humalog Mix at 52 units with Breakfast  BUT INCREASE the evening dose from 44 units to 52 units     Thanks    Abby Nena Jordan, MD  [facility]  [facility] [address] [place], [place] [address] Phone: [phone] FAX: [phone]

## 2019-08-01 NOTE — Telephone Encounter (Signed)
Pt aware of adjustment.

## 2019-08-04 ENCOUNTER — Other Ambulatory Visit: Payer: Self-pay

## 2019-08-04 MED ORDER — FREESTYLE LIBRE 2 SENSOR MISC: 6 refills | Status: DC

## 2019-08-05 ENCOUNTER — Other Ambulatory Visit: Payer: Self-pay

## 2019-08-05 ENCOUNTER — Other Ambulatory Visit: Payer: Self-pay | Admitting: Internal Medicine

## 2019-08-05 MED ORDER — FREESTYLE LIBRE 2 SENSOR MISC: 6 refills | Status: DC

## 2019-08-05 MED ORDER — FREESTYLE LIBRE 2 SENSOR MISC: 1.0000 | 3 refills | Status: DC

## 2019-08-19 ENCOUNTER — Telehealth: Payer: Self-pay

## 2019-08-19 NOTE — Telephone Encounter (Signed)
-----   Message from Armen Pickup sent at 08/18/2019  5:05 PM EDT ----- Regarding: FW: [place] Night  ----- Message ----- From: Ocie Doyne, RN Sent: 08/03/2019   4:25 PM EDT To: Jeannetta Nap, CMA Subject: Freestyle LIbre                                His Aniceto Boss, Can you please send the prescription for the [place] sensors to [employer].  She is medicare and it needs to go through a DME.  Fax number is [phone] Thank you Vaughan Basta

## 2019-08-19 NOTE — Telephone Encounter (Signed)
I contacted patient to make her aware that she has to establish care with [facility] before we can send in an Rx for her freestyle libre sensors-I requested a call back if she has any questions-once she has established care [facility] will fax [place] the CMN to be signed by the MD

## 2019-08-22 NOTE — Telephone Encounter (Signed)
Noted  

## 2019-08-22 NOTE — Telephone Encounter (Signed)
Patient called to advise that she did contact [facility], got established, and they sent her sensors.

## 2019-10-13 ENCOUNTER — Ambulatory Visit (INDEPENDENT_AMBULATORY_CARE_PROVIDER_SITE_OTHER): Payer: Medicare Other | Admitting: Internal Medicine

## 2019-10-13 ENCOUNTER — Other Ambulatory Visit: Payer: Self-pay

## 2019-10-13 ENCOUNTER — Encounter: Payer: Self-pay | Admitting: Internal Medicine

## 2019-10-13 VITALS — BP 142/72 | Ht 64.0 in | Wt 210.0 lb

## 2019-10-13 DIAGNOSIS — R109 Unspecified abdominal pain: Secondary | ICD-10-CM | POA: Diagnosis not present

## 2019-10-13 DIAGNOSIS — E1121 Type 2 diabetes mellitus with diabetic nephropathy: Secondary | ICD-10-CM | POA: Diagnosis not present

## 2019-10-13 DIAGNOSIS — N1832 Chronic kidney disease, stage 3b: Secondary | ICD-10-CM | POA: Diagnosis not present

## 2019-10-13 DIAGNOSIS — Z794 Long term (current) use of insulin: Secondary | ICD-10-CM

## 2019-10-13 DIAGNOSIS — E1122 Type 2 diabetes mellitus with diabetic chronic kidney disease: Secondary | ICD-10-CM

## 2019-10-13 LAB — URINALYSIS, ROUTINE W REFLEX MICROSCOPIC
Bilirubin Urine: NEGATIVE
Ketones, ur: NEGATIVE
Nitrite: NEGATIVE
Specific Gravity, Urine: >=1.03 — AB (ref 1.000–1.030)
Total Protein, Urine: >=300 — AB
Urine Glucose: NEGATIVE
Urobilinogen, UA: 0.2 (ref 0.0–1.0)
pH: 5.5 (ref 5.0–8.0)

## 2019-10-13 LAB — POCT GLYCOSYLATED HEMOGLOBIN (HGB A1C): Hemoglobin A1C: 10.7 % — AB (ref 4.0–5.6)

## 2019-10-13 MED ORDER — RYBELSUS 3 MG PO TABS: 3.0000 mg | ORAL_TABLET | Freq: Every day | ORAL | 6 refills | Status: DC

## 2019-10-13 MED ORDER — INSULIN LISPRO (1 UNIT DIAL) 100 UNIT/ML (KWIKPEN): 12.0000 [IU] | PEN_INJECTOR | Freq: Three times a day (TID) | SUBCUTANEOUS | 6 refills | Status: DC

## 2019-10-13 MED ORDER — CIPROFLOXACIN HCL 500 MG PO TABS: 500.0000 mg | ORAL_TABLET | Freq: Every day | ORAL | 0 refills | Status: DC

## 2019-10-13 MED ORDER — TOUJEO MAX SOLOSTAR 300 UNIT/ML ~~LOC~~ SOPN: 45.0000 [IU] | PEN_INJECTOR | Freq: Every day | SUBCUTANEOUS | 6 refills | Status: DC

## 2019-10-13 NOTE — Patient Instructions (Addendum)
-   Humalog Mix 52 units with Breakfast and 50 units with Supper  - Start Rybelsys 3 mg, 1 tablet with Breakfast     WHEN you are done with that prescription start:  - Start [place] 45 units once daily  - Start Humalog 12 units with each meal  -Humalog correctional insulin: ADD extra units on insulin to your meal-time Humalog dose if your blood sugars are higher than 160. Use the scale below to help guide you:   Blood sugar before meal Number of units to inject  Less than 160 0 unit  161 -  190 1 units  191 -  220 2 units  221 -  250 3 units  251 -  280 4 units  281 -  310 5 units  311 -  340 6 units  341 -  370 7 units  371 -  400 8 units  401 - 430 9 units         HOW TO TREAT LOW BLOOD SUGARS (Blood sugar LESS THAN 70 MG/DL)  Please follow the RULE OF 15 for the treatment of hypoglycemia treatment (when your (blood sugars are less than 70 mg/dL)    STEP 1: Take 15 grams of carbohydrates when your blood sugar is low, which includes:   3-4 GLUCOSE TABS  OR  3-4 OZ OF JUICE OR REGULAR SODA OR  ONE TUBE OF GLUCOSE GEL     STEP 2: RECHECK blood sugar in 15 MINUTES STEP 3: If your blood sugar is still low at the 15 minute recheck --> then, go back to STEP 1 and treat AGAIN with another 15 grams of carbohydrates.

## 2019-10-13 NOTE — Progress Notes (Signed)
Name: Hayley Case  Age/ Sex: 69 y.o., female   MRN/ DOB: [id number], August 03, 1950     PCP: Leeroy Cha, MD   Reason for Endocrinology Evaluation: Type 2 Diabetes Mellitus  Initial Endocrine Consultative Visit: 11/30/2018    PATIENT IDENTIFIER: Hayley Case is a 69 y.o. female with a past medical history of HTN, T2DM and Dyslipidemia . The patient has followed with Endocrinology clinic since 11/30/2018 for consultative assistance with management of her diabetes.  DIABETIC HISTORY:  Hayley Case was diagnosed with T2DM > 20 yrs ago. She was on metformin, Trulicity, and Byetta. Has been on insulin since ~ 2010.  Her hemoglobin A1c has ranged from 7.6 % in 2019, peaking at 10.7 % in 2020.   On her initial visit to our clinic, she had an A1c of 11.8%. She was on lantus/humalog but was not taking regularly, switched to insulin mix for ease of take.    Was on Lipitor but caused elevated LFT's requiring hospitalization.  Moved from [place] to help her son who is on peritoneal dialysis. Has lost another son to MI    By 06/2019 we attempted to put her on the V-Go but was cost prohibitive.   SUBJECTIVE:   During the last visit (06/07/2019): A1c 12.2 %, Adjusted Humalog mix.   Today (10/13/2019): Hayley Case is here for a follow up on diabetes.  She checks her blood sugars 1 times daily, preprandial to breakfast. The patient has had hypoglycemic episodes since the last clinic visit, which typically occur 1 x / month- most often occuring in the morning . The patient is symptomatic with these episodes.   She is having lower abdominal pain and back pain , has chronic frequency but no fever or dysuria   HOME DIABETES REGIMEN:  Humalog Mix 52 units BID    Statin: Off due to elevated LFT's ACE-I/ARB: Yes     CONTINUOUS GLUCOSE MONITORING RECORD INTERPRETATION    Dates of Recording: 9/3- 10/13/2019  Sensor description:freestyle libre  Results statistics:   CGM  use % of time 69  Average and SD 288/32.8  Time in range    15    %  % Time Above 180 18  % Time above 250 66  % Time Below target 1    Glycemic patterns summary: Hyperglycemia all day and night   Hyperglycemic episodes  All day and night   Hypoglycemic episodes occurred early hours of the morning   Overnight periods: variable between hypo and hyperglycemia         DIABETIC COMPLICATIONS: Microvascular complications:   CKD III, DR , neuropathy   Last eye exam: Completed > 1 yr   Macrovascular complications:    Denies: CAD, PVD, CVA   HISTORY:  Past Medical History:  Past Medical History:  Diagnosis Date  . CKD (chronic kidney disease)   . Diabetes mellitus without complication ([place])   . Hyperlipidemia   . Hypertension    Past Surgical History:  Past Surgical History:  Procedure Laterality Date  . BREAST BIOPSY    . BREAST EXCISIONAL BIOPSY Left     Social History:  reports that she has never smoked. She has never used smokeless tobacco. She reports that she does not drink alcohol and does not use drugs. Family History:  Family History  Problem Relation Age of Onset  . Skin cancer Mother   . Heart attack Father   . Colon cancer Sister      HOME MEDICATIONS: Allergies  as of 10/13/2019      Reactions   Lisinopril Cough   Lipitor [atorvastatin] Other (See Comments)   Liver function   Penicillins Hives, Itching   Did it involve swelling of the face/tongue/throat, SOB, or low BP? No Did it involve sudden or severe rash/hives, skin peeling, or any reaction on the inside of your mouth or nose? Yes Did you need to seek medical attention at a hospital or doctor's office? Yes When did it last happen?2012 If all above answers are "NO", may proceed with cephalosporin use.   Septra [sulfamethoxazole-trimethoprim] Hives      Medication List       Accurate as of October 13, 2019  9:12 AM. If you have any questions, ask your nurse or doctor.         acetaminophen 500 MG tablet Commonly known as: TYLENOL Take 500 mg by mouth every 4 (four) hours as needed for headache (pain).   albuterol 108 (90 Base) MCG/ACT inhaler Commonly known as: VENTOLIN HFA Inhale 2 puffs into the lungs every 6 (six) hours as needed for wheezing or shortness of breath.   allopurinol 100 MG tablet Commonly known as: ZYLOPRIM Take 1 tablet (100 mg total) by mouth daily. Must have office visit for refills   amLODipine 10 MG tablet Commonly known as: NORVASC TAKE 1 TABLET BY MOUTH DAILY   amLODIPine Benzoate 1 MG/ML Susp Take 10 mg by mouth.   COLACE PO Take by mouth as needed.   ezetimibe 10 MG tablet Commonly known as: ZETIA Take 1 tablet (10 mg total) by mouth daily.   febuxostat 40 MG tablet Commonly known as: ULORIC Take 40 mg by mouth daily.   fluticasone 50 MCG/ACT nasal spray Commonly known as: FLONASE Place 2 sprays into both nostrils daily.   FreeStyle Libre 2 Sensor Misc Inject 1 Device into the skin as directed. Use as directed every 14 days. E11.21   glucose blood test strip Use as instructed. Inject into the skin twice daily.   Insulin Lispro Prot & Lispro (75-25) 100 UNIT/ML Kwikpen Commonly known as: HumaLOG Mix 75/25 KwikPen Inject 52 Units into the skin in the morning and at bedtime.   Insulin Syringes (Disposable) U-100 1 ML Misc 1 Device by Does not apply route daily.   loratadine 10 MG tablet Commonly known as: CLARITIN Take 10 mg by mouth daily as needed (seasonal allergies).   losartan 25 MG tablet Commonly known as: COZAAR Take 1 tablet (25 mg total) by mouth daily.   Misc. Devices Misc Please provide patient with insurance approved blood pressure monitor.   ONE TOUCH DELICA LANCING DEV Misc Use as instructed. Inject into the skin twice daily   OneTouch Verio w/Device Kit Use as instructed. Inject into the skin twice daily.   Pen Needles 32G X 4 MM Misc 1 Device by Does not apply route 2 (two)  times daily.   REFRESH TEARS OP Place 1 drop into both eyes at bedtime.   rosuvastatin 40 MG tablet Commonly known as: CRESTOR Take 1 tablet (40 mg total) by mouth daily.   tiZANidine 4 MG tablet Commonly known as: ZANAFLEX Take 4 mg by mouth 2 (two) times daily as needed.   VITAMIN C PO Take 1 tablet by mouth 2 (two) times a week.   Vitamin D (Ergocalciferol) 1.25 MG (50000 UNIT) Caps capsule Commonly known as: DRISDOL TAKE 1 CAPSULE BY MOUTH EVERY 7 DAYS        OBJECTIVE:   Vital Signs:  BP (!) 142/72   Ht '5\' 4"'  (1.626 m)   Wt 210 lb (95.3 kg)   BMI 36.05 kg/m   Wt Readings from Last 3 Encounters:  10/13/19 210 lb (95.3 kg)  06/21/19 219 lb (99.3 kg)  06/07/19 205 lb 12.8 oz (93.4 kg)     Exam: General: Pt appears well and is in NAD  Lungs: Clear with good BS bilat with no rales, rhonchi, or wheezes  Heart: RRR with normal S1 and S2 and no gallops; no murmurs; no rub  Abdomen:  Soft with supra pubic tenderness  Extremities: Trace pretibial edema.  Neuro: MS is good with appropriate affect, pt is alert and Ox3       DM foot exam: 11/2019  The skin of the feet is intact without sores or ulcerations. The pedal pulses are 2+ on right and 2+ on left. The sensation is intact to a screening 5.07, 10 gram monofilament bilaterally    DATA REVIEWED:  Lab Results  Component Value Date   HGBA1C 10.7 (A) 10/13/2019   HGBA1C 12.2 (A) 06/07/2019   HGBA1C 11.8 (A) 11/30/2018   Lab Results  Component Value Date   LDLCALC 54 05/06/2019   CREATININE 2.17 (H) 05/06/2019     Lab Results  Component Value Date   CHOL 114 05/06/2019   HDL 35 (L) 05/06/2019   LDLCALC 54 05/06/2019   TRIG 145 05/06/2019   CHOLHDL 3.3 05/06/2019       Results for Hayley Case, Hayley Case (MRN [id number]) as of 10/14/2019 12:05  Ref. Range 10/13/2019 09:46  Appearance Latest Ref Range: Clear;Turbid;Slightly Cloudy;Cloudy  Cloudy (A)  Bilirubin Urine Latest Ref Range: Negative  NEGATIVE   Color, Urine Latest Ref Range: Yellow;Lt. Yellow;Straw;Dark Yellow;Amber;Green;Red;Brown  YELLOW  Hgb urine dipstick Latest Ref Range: Negative  MODERATE (A)  Ketones, ur Latest Ref Range: Negative  NEGATIVE  Leukocytes,Ua Latest Ref Range: Negative  LARGE (A)  Nitrite Latest Ref Range: Negative  NEGATIVE  pH Latest Ref Range: 5.0 - 8.0  5.5  Specific Gravity, Urine Latest Ref Range: 1.000 - 1.030  >=1.030 (A)  Urine Glucose Latest Ref Range: Negative  NEGATIVE  Urobilinogen, UA Latest Ref Range: 0.0 - 1.0  0.2  Bacteria, UA Latest Ref Range: None  Many(>50/hpf) (A)  RBC / HPF Latest Ref Range: 0-2/hpf  7-10/hpf (A)  Squamous Epithelial / LPF Latest Ref Range: Rare(0-4/hpf)  Few(5-10/hpf) (A)  WBC, UA Latest Ref Range: 0-2/hpf  TNTC(>50/hpf) (A)    ASSESSMENT / PLAN / RECOMMENDATIONS:   1) Type 2 Diabetes Mellitus, Poorly controlled, With CKD III, retinopathy and neuropathic complications - Most recent A1c of 10.7 %. Goal A1c < 7.5 %.     - A1c down from 12.2 %  -She she was initially on MDI regimen with an A1c of 11.8%, I have changed this to the insulin mix to see if that will make it easier for her to take her insulin regularly, but unfortunately after a year into this regimen her A1c is still high at 10.7%.  Patient tends to hold on insulin intake if her BG's are less than 200. -We have attempted to put her on the V-go but that was cost prohibitive -I have advised her to switch back to the MDI regimen and she is going to have to do her best in taking her prandial insulin with meals. -We also discussed adding a GLP-1 agonist, I have cautioned her against GI side effects.  MEDICATIONS: -Stop Humalog mix -Start Toujeo 45  units once daily -Start Humalog 12 units with each meal -Start Rybelsus 3 mg 1 tablet with breakfast -CF: Humalog (BG -130/30)     EDUCATION / INSTRUCTIONS:  BG monitoring instructions: Patient is instructed to check her blood sugars 3 times a day, before  meals . I reviewed the Rule of 15 for the treatment of hypoglycemia in detail with the patient. Literature supplied.    2.  UTI:   -Urinalysis came back positive -We will start antibiotics as below   Medication Ciprofloxacin 250 mg, 1 tablet daily  F/U in 4 months    Signed electronically by: Mack Guise, MD  [facility]  [facility] [address] [place], [place] [address] Phone: [phone] FAX: [phone]   CC: Leeroy Cha, MD [address] [place] [address] Phone: [phone]  Fax: [phone]  Return to Endocrinology clinic as below: Future Appointments  Date Time Provider [place]  11/14/2019  9:00 AM O'Neal, Cassie Freer, MD [facility]

## 2019-10-14 DIAGNOSIS — R109 Unspecified abdominal pain: Secondary | ICD-10-CM | POA: Insufficient documentation

## 2019-11-11 ENCOUNTER — Ambulatory Visit: Payer: Medicare Other

## 2019-11-13 NOTE — Progress Notes (Signed)
Cardiology Office Note:   Date:  11/14/2019  NAME:  Hayley Case    MRN: [id number] DOB:  February 11, 1950   PCP:  Leeroy Cha, MD  Cardiologist:  Evalina Field, MD   Referring MD: Leeroy Cha,*   Chief Complaint  Patient presents with  . Follow-up   History of Present Illness:   Hayley Case is a 69 y.o. female with a hx of DM, HLD, HTN, CKD, carotid artery disease who presents for follow-up. A1c has improved. LDL at goal. We held her coreg due to dizziness.  Blood pressure is at goal today.  131/68.  She has not taken her medications yet.  She is only on amlodipine as well as losartan.  She is on minimal doses.  I encouraged her to check her blood pressure at home.  She describes no dizziness spells.  She did need help filling out transportation assistance forms which I did in the office.  She also reports she is having some cramping sensation in her right leg.  She reports the leg can give out when she exerts herself.  She does have diminished pulses in the right lower extremity.  Her most recent cholesterol level is at goal.  She is working on her diabetes.  She is working with her endocrinologist and they have change around her medications.  She does have sleep apnea but wears her machine infrequently.  She is still helping her son out who is on dialysis.  Apparently he is doing home dialysis, up to 4 days/week.  These are 5-hour sessions.  This is quite stressful.  She also is in the process of moving [place] her son is unable to help her move due to his health ailments.  Hopefully he will be considered for kidney transplant at some point.  Other than that she describes no chest pain or shortness of breath.  She is not exercising routinely but has no major limitations with her current level of activity.  The big issue is inability to walk for prolonged periods due to right leg pain.  She also has some memory issues it seems.  Problem List 1. HTN 2. Obesity (BMI  36) 3. Diabetes -A1c 10.7 -T chol 114, HDL 35, LDL 54, TG 145 4. Carotid artery disease 03/09/2019 -R ICA 1-39% -L ICA 40-59% 5. OSA 6. CKD Stage 3/4  Past Medical History: Past Medical History:  Diagnosis Date  . CKD (chronic kidney disease)   . Diabetes mellitus without complication ([place])   . Hyperlipidemia   . Hypertension     Past Surgical History: Past Surgical History:  Procedure Laterality Date  . BREAST BIOPSY    . BREAST EXCISIONAL BIOPSY Left     Current Medications: Current Meds  Medication Sig  . acetaminophen (TYLENOL) 500 MG tablet Take 500 mg by mouth every 4 (four) hours as needed for headache (pain).  Marland Kitchen albuterol (VENTOLIN HFA) 108 (90 Base) MCG/ACT inhaler Inhale 2 puffs into the lungs every 6 (six) hours as needed for wheezing or shortness of breath.  . allopurinol (ZYLOPRIM) 100 MG tablet Take 1 tablet (100 mg total) by mouth daily. Must have office visit for refills  . amLODIPine Benzoate 1 MG/ML SUSP Take 10 mg by mouth.  . Ascorbic Acid (VITAMIN C PO) Take 1 tablet by mouth 2 (two) times a week.  . Carboxymethylcellulose Sodium (REFRESH TEARS OP) Place 1 drop into both eyes at bedtime.   . ciprofloxacin (CIPRO) 500 MG tablet Take 1 tablet (  500 mg total) by mouth daily with breakfast.  . Continuous Blood Gluc Sensor (FREESTYLE LIBRE 2 SENSOR) MISC Inject 1 Device into the skin as directed. Use as directed every 14 days. E11.21  . Docusate Sodium (COLACE PO) Take by mouth as needed.  . ezetimibe (ZETIA) 10 MG tablet Take 1 tablet (10 mg total) by mouth daily.  . febuxostat (ULORIC) 40 MG tablet Take 40 mg by mouth daily.  . fluticasone (FLONASE) 50 MCG/ACT nasal spray Place 2 sprays into both nostrils daily.  . insulin glargine, 2 Unit Dial, (TOUJEO MAX SOLOSTAR) 300 UNIT/ML Solostar Pen Inject 46 Units into the skin daily.  . insulin lispro (HUMALOG KWIKPEN) 100 UNIT/ML KwikPen Inject 12 Units into the skin 3 (three) times daily. Max daily 70 units with  correction scale  . Insulin Pen Needle (PEN NEEDLES) 32G X 4 MM MISC 1 Device by Does not apply route 2 (two) times daily.  . Insulin Syringes, Disposable, U-100 1 ML MISC 1 Device by Does not apply route daily.  Marland Kitchen loratadine (CLARITIN) 10 MG tablet Take 10 mg by mouth daily as needed (seasonal allergies).  . Misc. Devices MISC Please provide patient with insurance approved blood pressure monitor.  . Semaglutide (RYBELSUS) 3 MG TABS Take 3 mg by mouth daily with breakfast.  . Vitamin D, Ergocalciferol, (DRISDOL) 1.25 MG (50000 UT) CAPS capsule TAKE 1 CAPSULE BY MOUTH EVERY 7 DAYS  . [DISCONTINUED] amLODipine (NORVASC) 10 MG tablet TAKE 1 TABLET BY MOUTH DAILY  . [DISCONTINUED] losartan (COZAAR) 25 MG tablet Take 1 tablet (25 mg total) by mouth daily.     Allergies:    Lisinopril, Lipitor [atorvastatin], Penicillins, and Septra [sulfamethoxazole-trimethoprim]   Social History: Social History   Socioeconomic History  . Marital status: Widowed    Spouse name: Not on file  . Number of children: Not on file  . Years of education: Not on file  . Highest education level: Not on file  Occupational History  . Not on file  Tobacco Use  . Smoking status: Never Smoker  . Smokeless tobacco: Never Used  Substance and Sexual Activity  . Alcohol use: No  . Drug use: No  . Sexual activity: Not Currently  Other Topics Concern  . Not on file  Social History Narrative  . Not on file   Social Determinants of Health   Financial Resource Strain:   . Difficulty of Paying Living Expenses: Not on file  Food Insecurity:   . Worried About Charity fundraiser in the Last Year: Not on file  . Ran Out of Food in the Last Year: Not on file  Transportation Needs:   . Lack of Transportation (Medical): Not on file  . Lack of Transportation (Non-Medical): Not on file  Physical Activity:   . Days of Exercise per Week: Not on file  . Minutes of Exercise per Session: Not on file  Stress:   . Feeling of  Stress : Not on file  Social Connections:   . Frequency of Communication with Friends and Family: Not on file  . Frequency of Social Gatherings with Friends and Family: Not on file  . Attends Religious Services: Not on file  . Active Member of Clubs or Organizations: Not on file  . Attends Archivist Meetings: Not on file  . Marital Status: Not on file     Family History: The patient's family history includes Colon cancer in her sister; Heart attack in her father; Skin cancer in  her mother.  ROS:   All other ROS reviewed and negative. Pertinent positives noted in the HPI.     EKGs/Labs/Other Studies Reviewed:   The following studies were personally reviewed by me today:  TTE 08/17/2018 1. The left ventricle has normal systolic function with an ejection  fraction of 60-65%. The cavity size was normal. There is mild asymmetric  left ventricular hypertrophy. Left ventricular diastolic Doppler  parameters are consistent with impaired  relaxation.  2. No evidence of mitral valve stenosis.  3. No stenosis of the aortic valve.  4. The aorta is normal in size and structure.  5. Grossly normal.   NM Stress 09/24/2018  The left ventricular ejection fraction is normal (55-65%).  Nuclear stress EF: 61%.  There was no ST segment deviation noted during stress.  The study is normal.  This is a low risk study.   Normal stress nuclear study with no ischemia or infarction.  Gated ejection fraction 61% with normal wall motion.  Carotid US 03/11/2019 Summary:  Right Carotid: Velocities in the right ICA are consistent with a 1-39%  stenosis.         Non-hemodynamically significant plaque <50% noted in the  CCA.   Left Carotid: Non-hemodynamically significant plaque <50% noted in the  CCA.        Velocities in left ICA are consistent with a low end range  40-59%        stenosis.   Vertebrals: Bilateral vertebral arteries demonstrate antegrade  flow.  Subclavians: Normal flow hemodynamics were seen in bilateral subclavian        arteries.   *See table(s) above for measurements and observations.  Suggest follow up study in 12 months.   Recent Labs: 05/06/2019: ALT 26; BUN 30; Creatinine, Ser 2.17; Potassium 4.9; Sodium 137   Recent Lipid Panel    Component Value Date/Time   CHOL 114 05/06/2019 1222   TRIG 145 05/06/2019 1222   HDL 35 (L) 05/06/2019 1222   CHOLHDL 3.3 05/06/2019 1222   LDLCALC 54 05/06/2019 1222    Physical Exam:   VS:  BP 131/68   Pulse 77   Ht 5\' 4"  (1.626 m)   Wt 211 lb 3.2 oz (95.8 kg)   BMI 36.25 kg/m    Wt Readings from Last 3 Encounters:  11/14/19 211 lb 3.2 oz (95.8 kg)  10/13/19 210 lb (95.3 kg)  06/21/19 219 lb (99.3 kg)    General: Well nourished, well developed, in no acute distress Heart: Atraumatic, normal size  Eyes: PEERLA, EOMI  Neck: Supple, no JVD, left carotid bruit noted Endocrine: No thryomegaly Cardiac: Normal S1, S2; RRR; no murmurs, rubs, or gallops Lungs: Clear to auscultation bilaterally, no wheezing, rhonchi or rales  Abd: Soft, nontender, no hepatomegaly  Ext: No edema, pulses are 2+ in the left lower extremity, pulses are absent in the right lower extremity Musculoskeletal: No deformities, BUE and BLE strength normal and equal Skin: Warm and dry, no rashes   Neuro: Alert and oriented to person, place, time, and situation, CNII-XII grossly intact, no focal deficits  Psych: Normal mood and affect   ASSESSMENT:   JUDIANN CELIA is a 69 y.o. female who presents for the following: 1. Bilateral carotid artery stenosis   2. Mixed hyperlipidemia   3. Essential hypertension   4. Claudication in peripheral vascular disease ([place])   5. Pain of right lower extremity     PLAN:   1. Bilateral carotid artery stenosis -R ICA 1-39% -L  ICA 40-59% -No strokelike symptoms reported.  Most recent LDL cholesterol is at goal.  Continue Crestor and Zetia. -I will see her  back in 6 months and repeat carotid ultrasounds yearly.  2. Mixed hyperlipidemia -Most recent cholesterol at goal.  3. Essential hypertension -Blood pressure stable.  I want her to check it at home.  Continue amlodipine and losartan.  Refills provided today.  4. Claudication in peripheral vascular disease ([place]) 5. Pain of right lower extremity -She does report exertional cramping in the right lower extremity.  She has absent pulses.  I would like to obtain ABIs as well as arterial ultrasounds of lower extremities.  She does have carotid artery disease and likely has disease in the lower extremities.  Disposition: Return in about 6 months (around 05/13/2020).  Medication Adjustments/Labs and Tests Ordered: Current medicines are reviewed at length with the patient today.  Concerns regarding medicines are outlined above.  Orders Placed This Encounter  Procedures  . VAS [place] LOWER EXTREMITY ARTERIAL DUPLEX  . VAS [place] ABI WITH/WO TBI  . VAS US CAROTID   No orders of the defined types were placed in this encounter.   Patient Instructions  Medication Instructions:  Continue same medications *If you need a refill on your cardiac medications before your next appointment, please call your pharmacy*   Lab Work: None ordered   Testing/Procedures: Schedule lower ext arterial dopplers soon Schedule ABI's  soon  Schedule Carotid dopplers in 6 months    Follow-Up: At [facility], you and your health needs are our priority.  As part of our continuing mission to provide you with exceptional heart care, we have created designated Provider Care Teams.  These Care Teams include your primary Cardiologist (physician) and Advanced Practice Providers (APPs -  Physician Assistants and Nurse Practitioners) who all work together to provide you with the care you need, when you need it.  We recommend signing up for the patient portal called "MyChart".  Sign up information is provided on this After Visit  Summary.  MyChart is used to connect with patients for Virtual Visits (Telemedicine).  Patients are able to view lab/test results, encounter notes, upcoming appointments, etc.  Non-urgent messages can be sent to your provider as well.   To learn more about what you can do with MyChart, go to [email or web address].    Your next appointment: 6 months   Call in March to schedule May appointment    The format for your next appointment: Office   Provider:  Dr.O'Neal      Time Spent with Patient: I have spent a total of 35 minutes with patient reviewing hospital notes, telemetry, EKGs, labs and examining the patient as well as establishing an assessment and plan that was discussed with the patient.  > 50% of time was spent in direct patient care.  Signed, Addison Naegeli. Audie Box, [place]  [address] [place], [place] [address] [phone]  11/14/2019 11:21 AM

## 2019-11-14 ENCOUNTER — Telehealth: Payer: Self-pay | Admitting: Licensed Clinical Social Worker

## 2019-11-14 ENCOUNTER — Other Ambulatory Visit: Payer: Self-pay

## 2019-11-14 ENCOUNTER — Ambulatory Visit (INDEPENDENT_AMBULATORY_CARE_PROVIDER_SITE_OTHER): Payer: Medicare Other | Admitting: Cardiovascular Disease

## 2019-11-14 ENCOUNTER — Encounter: Payer: Self-pay | Admitting: Cardiovascular Disease

## 2019-11-14 ENCOUNTER — Telehealth: Payer: Self-pay | Admitting: Internal Medicine

## 2019-11-14 VITALS — BP 131/68 | HR 77 | Ht 64.0 in | Wt 211.2 lb

## 2019-11-14 DIAGNOSIS — M79604 Pain in right leg: Secondary | ICD-10-CM

## 2019-11-14 DIAGNOSIS — I6523 Occlusion and stenosis of bilateral carotid arteries: Secondary | ICD-10-CM | POA: Diagnosis not present

## 2019-11-14 DIAGNOSIS — I10 Essential (primary) hypertension: Secondary | ICD-10-CM

## 2019-11-14 DIAGNOSIS — E782 Mixed hyperlipidemia: Secondary | ICD-10-CM | POA: Diagnosis not present

## 2019-11-14 DIAGNOSIS — I739 Peripheral vascular disease, unspecified: Secondary | ICD-10-CM | POA: Diagnosis not present

## 2019-11-14 MED ORDER — LOSARTAN POTASSIUM 25 MG PO TABS: 25.0000 mg | ORAL_TABLET | Freq: Every day | ORAL | 3 refills | Status: DC

## 2019-11-14 MED ORDER — AMLODIPINE BESYLATE 10 MG PO TABS: ORAL_TABLET | ORAL | 3 refills | Status: DC

## 2019-11-14 NOTE — Telephone Encounter (Signed)
Patient returning call.

## 2019-11-14 NOTE — Patient Instructions (Signed)
Medication Instructions:  Continue same medications *If you need a refill on your cardiac medications before your next appointment, please call your pharmacy*   Lab Work: None ordered   Testing/Procedures: Schedule lower ext arterial dopplers soon Schedule ABI's  soon  Schedule Carotid dopplers in 6 months    Follow-Up: At [facility], you and your health needs are our priority.  As part of our continuing mission to provide you with exceptional heart care, we have created designated Provider Care Teams.  These Care Teams include your primary Cardiologist (physician) and Advanced Practice Providers (APPs -  Physician Assistants and Nurse Practitioners) who all work together to provide you with the care you need, when you need it.  We recommend signing up for the patient portal called "MyChart".  Sign up information is provided on this After Visit Summary.  MyChart is used to connect with patients for Virtual Visits (Telemedicine).  Patients are able to view lab/test results, encounter notes, upcoming appointments, etc.  Non-urgent messages can be sent to your provider as well.   To learn more about what you can do with MyChart, go to [email or web address].    Your next appointment: 6 months   Call in March to schedule May appointment    The format for your next appointment: Office   Provider:  Dr.O'Neal

## 2019-11-14 NOTE — Telephone Encounter (Signed)
CSW received a call back from pt [phone]). Introduced self, role, reason for call. Confirmed pt interest in transportation resources. Pt lives at home with her son Scherrie Bateman currently at [address] [place], [place], [place], [address]. Currently he has been taking off work to take her to appointments. CSW explained that if pt is going to a [facility] provider she is able to get appointments scheduled through [facility]. She has a ride to her booster appointment this Friday but is interested in a ride to her appointment on 11/15 at the [place] office.   Pt will be moving on 11/20 to a new address in [facility]- [address] [place], [place], [address]. Discussed that pt would still be able to access [place] at that address.   CSW also will send additional transportation resources to [email or web address] that pt can use to get to other errands/medical appointments.   Appointment pick up scheduled for 2:15pm for pt for 3:00pm appointment on 11/15 at [facility] office.   Westley Hummer, MSW, [place] [employer]

## 2019-11-14 NOTE — Telephone Encounter (Signed)
Called pt to discuss scheduling transportation for some of her upcoming appointments. No answer, left HIPAA compliant message at [phone].   Westley Hummer, MSW, [place] [employer]

## 2019-11-14 NOTE — Telephone Encounter (Signed)
   Hayley Case DOB: [birth year] MRN: [id number]   RIDER WAIVER AND RELEASE OF LIABILITY  For purposes of improving physical access to our facilities, [facility] is pleased to partner with third parties to provide [place] patients or other authorized individuals the option of convenient, on-demand ground transportation services (the [place]") through use of the technology service that enables users to request on-demand ground transportation from independent third-party providers.  By opting to use and accept these [employer], I, the undersigned, hereby agree on behalf of myself, and on behalf of any minor child using the [employer] for whom I am the parent or legal guardian, as follows:  1. Government social research officer provided to me are provided by independent third-party transportation providers who are not [employer] or employees and who are unaffiliated with [employer]. 2. [place] is neither a transportation carrier nor a common or public carrier. 3. [facility] has no control over the quality or safety of the transportation that occurs as a result of the [employer]. 4. [facility] cannot guarantee that any third-party transportation provider will complete any arranged transportation service. 5. [facility] makes no representation, warranty, or guarantee regarding the reliability, timeliness, quality, safety, suitability, or availability of any of the Transport Services or that they will be error free. 6. I fully understand that traveling by vehicle involves risks and dangers of serious bodily injury, including permanent disability, paralysis, and death. I agree, on behalf of myself and on behalf of any minor child using the Transport Services for whom I am the parent or legal guardian, that the entire risk arising out of my use of the [employer] remains solely with me, to the maximum extent permitted under applicable law. 7. The [employer] are provided "as is" and "as available." [facility] disclaims all representations and warranties, express, implied or statutory, not expressly set out in these terms, including the implied warranties of merchantability and fitness for a particular purpose. 8. I hereby waive and release [facility], its agents, employees, officers, directors, representatives, insurers, attorneys, assigns, successors, subsidiaries, and affiliates from any and all past, present, or future claims, demands, liabilities, actions, causes of action, or suits of any kind directly or indirectly arising from acceptance and use of the [employer]. 9. I further waive and release [facility] and its affiliates from all present and future liability and responsibility for any injury or death to persons or damages to property caused by or related to the use of the [employer]. 10. I have read this Waiver and Release of Liability, and I understand the terms used in it and their legal significance. This Waiver is freely and voluntarily given with the understanding that my right (as well as the right of any minor child for whom I am the parent or legal guardian using the [employer]) to legal recourse against [facility] in connection with the [employer] is knowingly surrendered in return for use of these services.   I attest that I read the consent document to Hayley Case, gave Ms. Dotts the opportunity to ask questions and answered the questions asked (if any). I affirm that Hayley Case then provided consent for she's participation in this program.     Drucie Ip

## 2019-11-18 ENCOUNTER — Ambulatory Visit: Payer: Medicare Other

## 2019-11-18 ENCOUNTER — Ambulatory Visit: Payer: Medicare Other | Attending: Family

## 2019-11-18 DIAGNOSIS — Z23 Encounter for immunization: Secondary | ICD-10-CM

## 2019-11-21 ENCOUNTER — Telehealth: Payer: Self-pay | Admitting: Licensed Clinical Social Worker

## 2019-11-21 NOTE — Telephone Encounter (Signed)
CSW spoke with pt and [facility].  Pt provided with Transportation services number to confirm her pick up next Monday for her appointment at [facility]. Encouraged pt to call the office or Transportation Services if she had any issues.  Westley Hummer, MSW, [place] [employer]

## 2019-11-28 ENCOUNTER — Ambulatory Visit (HOSPITAL_COMMUNITY)
Admission: RE | Admit: 2019-11-28 | Discharge: 2019-11-28 | Disposition: A | Discharge status: Home / Self Care | Payer: Medicare Other | Source: Ambulatory Visit | Attending: Internal Medicine | Admitting: Internal Medicine

## 2019-11-28 DIAGNOSIS — I6523 Occlusion and stenosis of bilateral carotid arteries: Secondary | ICD-10-CM

## 2019-11-28 DIAGNOSIS — I10 Essential (primary) hypertension: Secondary | ICD-10-CM | POA: Diagnosis present

## 2019-11-28 DIAGNOSIS — M79604 Pain in right leg: Secondary | ICD-10-CM | POA: Diagnosis present

## 2019-11-28 DIAGNOSIS — E782 Mixed hyperlipidemia: Secondary | ICD-10-CM

## 2019-11-28 DIAGNOSIS — I739 Peripheral vascular disease, unspecified: Secondary | ICD-10-CM

## 2019-12-06 ENCOUNTER — Ambulatory Visit (INDEPENDENT_AMBULATORY_CARE_PROVIDER_SITE_OTHER): Payer: Medicare Other | Admitting: Cardiovascular Disease

## 2019-12-06 ENCOUNTER — Other Ambulatory Visit: Payer: Self-pay

## 2019-12-06 ENCOUNTER — Encounter: Payer: Self-pay | Admitting: Cardiovascular Disease

## 2019-12-06 VITALS — BP 136/74 | HR 98 | Ht 64.0 in | Wt 208.2 lb

## 2019-12-06 DIAGNOSIS — E785 Hyperlipidemia, unspecified: Secondary | ICD-10-CM | POA: Diagnosis not present

## 2019-12-06 DIAGNOSIS — I10 Essential (primary) hypertension: Secondary | ICD-10-CM

## 2019-12-06 DIAGNOSIS — I739 Peripheral vascular disease, unspecified: Secondary | ICD-10-CM

## 2019-12-06 MED ORDER — CILOSTAZOL 50 MG PO TABS: 50.0000 mg | ORAL_TABLET | Freq: Two times a day (BID) | ORAL | 5 refills | Status: DC

## 2019-12-06 NOTE — Patient Instructions (Signed)
Medication Instructions:  START Cilostazol (Pletal) 50 mg twice daily  *If you need a refill on your cardiac medications before your next appointment, please call your pharmacy*   Lab Work: None ordered If you have labs (blood work) drawn today and your tests are completely normal, you will receive your results only by: Marland Kitchen MyChart Message (if you have MyChart) OR . A paper copy in the mail If you have any lab test that is abnormal or we need to change your treatment, we will call you to review the results.   Testing/Procedures: None ordered   Follow-Up: At [facility], you and your health needs are our priority.  As part of our continuing mission to provide you with exceptional heart care, we have created designated Provider Care Teams.  These Care Teams include your primary Cardiologist (physician) and Advanced Practice Providers (APPs -  Physician Assistants and Nurse Practitioners) who all work together to provide you with the care you need, when you need it.  We recommend signing up for the patient portal called "MyChart".  Sign up information is provided on this After Visit Summary.  MyChart is used to connect with patients for Virtual Visits (Telemedicine).  Patients are able to view lab/test results, encounter notes, upcoming appointments, etc.  Non-urgent messages can be sent to your provider as well.   To learn more about what you can do with MyChart, go to [email or web address].    Your next appointment:   4 month(s)  The format for your next appointment:   In Person  Provider:   Kathlyn Sacramento, MD

## 2019-12-06 NOTE — Progress Notes (Signed)
Cardiology Office Note   Date:  12/06/2019   ID:  Hayley Case, DOB 01-28-1950, MRN [id number]  PCP:  Leeroy Cha, MD  Cardiologist: Dr. Audie Box  No chief complaint on file.     History of Present Illness: Hayley Case is a 69 y.o. female who was referred by Dr. Audie Box for evaluation management of peripheral arterial disease. She has known history of diabetes mellitus, hyperlipidemia, hypertension, chronic kidney disease and carotid artery disease. Cardiac testing in 2020 was unremarkable including an echocardiogram and a Lexiscan Myoview.  She reports prolonged symptoms of right calf claudication that actually started few years ago and has gradually worsened.  She has no rest pain or lower extremity ulceration.  Claudication usually happens after walking about 1 block and she has to stop and rest for few minutes before she can resume.  She has been diabetic for at least 20 years.  She is a lifelong non-smoker.  She underwent noninvasive vascular studies recently which showed an ABI of 0.59 on the right and 1.01 on the left.  Duplex showed borderline significant stenosis in the right SFA/popliteal artery although an occlusion that could not be completely excluded given significant medial calcification.  Most recent creatinine was 2.17 with a GFR of 26. She is under significant stress at home given that her son is on home dialysis.  Past Medical History:  Diagnosis Date  . CKD (chronic kidney disease)   . Diabetes mellitus without complication ([place])   . Hyperlipidemia   . Hypertension     Past Surgical History:  Procedure Laterality Date  . BREAST BIOPSY    . BREAST EXCISIONAL BIOPSY Left      Current Outpatient Medications  Medication Sig Dispense Refill  . acetaminophen (TYLENOL) 500 MG tablet Take 500 mg by mouth every 4 (four) hours as needed for headache (pain).    Marland Kitchen albuterol (VENTOLIN HFA) 108 (90 Base) MCG/ACT inhaler Inhale 2 puffs into the  lungs every 6 (six) hours as needed for wheezing or shortness of breath. 1 g 3  . allopurinol (ZYLOPRIM) 100 MG tablet Take 1 tablet (100 mg total) by mouth daily. Must have office visit for refills 30 tablet 0  . amLODipine (NORVASC) 10 MG tablet TAKE 1 TABLET BY MOUTH DAILY 90 tablet 3  . amLODIPine Benzoate 1 MG/ML SUSP Take 10 mg by mouth.    . Ascorbic Acid (VITAMIN C PO) Take 1 tablet by mouth 2 (two) times a week.    . Carboxymethylcellulose Sodium (REFRESH TEARS OP) Place 1 drop into both eyes at bedtime.     . ciprofloxacin (CIPRO) 500 MG tablet Take 1 tablet (500 mg total) by mouth daily with breakfast. 7 tablet 0  . Continuous Blood Gluc Sensor (FREESTYLE LIBRE 2 SENSOR) MISC Inject 1 Device into the skin as directed. Use as directed every 14 days. E11.21 12 each 3  . Docusate Sodium (COLACE PO) Take by mouth as needed.    . ezetimibe (ZETIA) 10 MG tablet Take 1 tablet (10 mg total) by mouth daily. 90 tablet 1  . febuxostat (ULORIC) 40 MG tablet Take 40 mg by mouth daily.    . fluticasone (FLONASE) 50 MCG/ACT nasal spray Place 2 sprays into both nostrils daily. 16 g 6  . insulin glargine, 2 Unit Dial, (TOUJEO MAX SOLOSTAR) 300 UNIT/ML Solostar Pen Inject 46 Units into the skin daily. 15 mL 6  . insulin lispro (HUMALOG KWIKPEN) 100 UNIT/ML KwikPen Inject 12 Units into the skin  3 (three) times daily. Max daily 70 units with correction scale 30 mL 6  . Insulin Pen Needle (PEN NEEDLES) 32G X 4 MM MISC 1 Device by Does not apply route 2 (two) times daily. 100 each 11  . Insulin Syringes, Disposable, U-100 1 ML MISC 1 Device by Does not apply route daily. 100 each 11  . loratadine (CLARITIN) 10 MG tablet Take 10 mg by mouth daily as needed (seasonal allergies).    . losartan (COZAAR) 25 MG tablet Take 1 tablet (25 mg total) by mouth daily. 90 tablet 3  . Misc. Devices MISC Please provide patient with insurance approved blood pressure monitor. 1 each 0  . Semaglutide (RYBELSUS) 3 MG TABS Take  3 mg by mouth daily with breakfast. 30 tablet 6  . Vitamin D, Ergocalciferol, (DRISDOL) 1.25 MG (50000 UT) CAPS capsule TAKE 1 CAPSULE BY MOUTH EVERY 7 DAYS 12 capsule 1  . cilostazol (PLETAL) 50 MG tablet Take 1 tablet (50 mg total) by mouth 2 (two) times daily. 60 tablet 5  . rosuvastatin (CRESTOR) 40 MG tablet Take 1 tablet (40 mg total) by mouth daily. 90 tablet 3   No current facility-administered medications for this visit.    Allergies:   Lisinopril, Lipitor [atorvastatin], Penicillins, and Septra [sulfamethoxazole-trimethoprim]    Social History:  The patient  reports that she has never smoked. She has never used smokeless tobacco. She reports that she does not drink alcohol and does not use drugs.   Family History:  The patient's family history includes Colon cancer in her sister; Heart attack in her father; Skin cancer in her mother.    ROS:  Please see the history of present illness.   Otherwise, review of systems are positive for none.   All other systems are reviewed and negative.    PHYSICAL EXAM: VS:  BP 136/74   Pulse 98   Ht 5\' 4"  (1.626 m)   Wt 208 lb 3.2 oz (94.4 kg)   SpO2 99%   BMI 35.74 kg/m  , BMI Body mass index is 35.74 kg/m. GEN: Well nourished, well developed, in no acute distress  HEENT: normal  Neck: no JVD, carotid bruits, or masses Cardiac: RRR; no murmurs, rubs, or gallops,no edema  Respiratory:  clear to auscultation bilaterally, normal work of breathing GI: soft, nontender, nondistended, + BS MS: no deformity or atrophy  Skin: warm and dry, no rash Neuro:  Strength and sensation are intact Psych: euthymic mood, full affect Vascular: Femoral pulses normal bilaterally.  Distal pulses are palpable on the left side only.   EKG:  EKG is ordered today. The ekg ordered today demonstrates normal sinus rhythm with no significant ST or T wave changes   Recent Labs: 05/06/2019: ALT 26; BUN 30; Creatinine, Ser 2.17; Potassium 4.9; Sodium 137     Lipid Panel    Component Value Date/Time   CHOL 114 05/06/2019 1222   TRIG 145 05/06/2019 1222   HDL 35 (L) 05/06/2019 1222   CHOLHDL 3.3 05/06/2019 1222   LDLCALC 54 05/06/2019 1222      Wt Readings from Last 3 Encounters:  12/06/19 208 lb 3.2 oz (94.4 kg)  11/14/19 211 lb 3.2 oz (95.8 kg)  10/13/19 210 lb (95.3 kg)       No flowsheet data found.    ASSESSMENT AND PLAN:  1.  Peripheral arterial disease with severe right calf claudication Rutherford class III.  I suspect that she likely has an occluded distal SFA/proximal popliteal artery.  I discussed with her the natural history and management of claudication.  I discussed the importance of controlling her risk factors.  I am especially concerned about her uncontrolled diabetes.  Fortunately, she has no evidence of critical limb ischemia.  Given advanced chronic kidney disease and GFR of 26, we should reserve angiography and endovascular intervention for worsening symptoms.  For the meantime, I discussed with her the importance of starting a regular walking program and I also elected to start her on Pletal 50 mg twice daily.  If there is worsening symptoms, we can consider proceeding with angiography and possible endovascular intervention or surgical revascularization.  I will reevaluate her symptoms in 4 months.  2.  Essential hypertension: Blood pressures controlled on current medications.  3.  Hyperlipidemia: Currently on rosuvastatin and Zetia.  Most recent lipid profile showed an LDL of 54.  4.  Chronic kidney disease: Most recent GFR was 26.    Disposition:   FU with me in 4 months  Signed,  Kathlyn Sacramento, MD  12/06/2019 1:17 PM    [facility]

## 2020-02-06 NOTE — Progress Notes (Signed)
   Covid-19 Vaccination Clinic  Name:  Hayley Case    MRN: [id number] DOB: 01-22-1950  02/06/2020  [name] was observed post Covid-19 immunization for 15 minutes without incident. She was provided with Vaccine Information Sheet and instruction to access the V-Safe system.   [name] was instructed to call 911 with any severe reactions post vaccine: Marland Kitchen Difficulty breathing  . Swelling of face and throat  . A fast heartbeat  . A bad rash all over body  . Dizziness and weakness   Immunizations Administered    Name Date Dose VIS Date Route   PFIZER Comrnaty(Gray TOP) Covid-19 Vaccine 11/18/2019  3:05 PM 0.3 mL 12/22/2019 Intramuscular   Manufacturer: [place]   Lot: [place]: [id number]

## 2020-02-09 ENCOUNTER — Other Ambulatory Visit: Payer: Self-pay

## 2020-02-13 ENCOUNTER — Ambulatory Visit (INDEPENDENT_AMBULATORY_CARE_PROVIDER_SITE_OTHER): Payer: Medicare Other | Admitting: Internal Medicine

## 2020-02-13 ENCOUNTER — Encounter: Payer: Self-pay | Admitting: Internal Medicine

## 2020-02-13 ENCOUNTER — Other Ambulatory Visit: Payer: Self-pay

## 2020-02-13 VITALS — BP 134/78 | HR 107 | Ht 64.0 in | Wt 207.2 lb

## 2020-02-13 DIAGNOSIS — Z794 Long term (current) use of insulin: Secondary | ICD-10-CM | POA: Diagnosis not present

## 2020-02-13 DIAGNOSIS — N1832 Chronic kidney disease, stage 3b: Secondary | ICD-10-CM

## 2020-02-13 DIAGNOSIS — E1122 Type 2 diabetes mellitus with diabetic chronic kidney disease: Secondary | ICD-10-CM | POA: Diagnosis not present

## 2020-02-13 LAB — POCT GLYCOSYLATED HEMOGLOBIN (HGB A1C): Hemoglobin A1C: 8.5 % — AB (ref 4.0–5.6)

## 2020-02-13 MED ORDER — RYBELSUS 7 MG PO TABS: 7.0000 mg | ORAL_TABLET | Freq: Every day | ORAL | 2 refills | Status: DC

## 2020-02-13 NOTE — Progress Notes (Signed)
Name: Hayley Case  Age/ Sex: 70 y.o., female   MRN/ DOB: [id number], Jan 31, 1950     PCP: Leeroy Cha, MD   Reason for Endocrinology Evaluation: Type 2 Diabetes Mellitus  Initial Endocrine Consultative Visit: 11/30/2018    PATIENT IDENTIFIER: Hayley Case is a 70 y.o. female with a past medical history of HTN, T2DM and Dyslipidemia . The patient has followed with Endocrinology clinic since 11/30/2018 for consultative assistance with management of her diabetes.  DIABETIC HISTORY:  [name] was diagnosed with T2DM > 20 yrs ago. She was on metformin, Trulicity, and Byetta. Has been on insulin since ~ 2010.  Her hemoglobin A1c has ranged from 7.6 % in 2019, peaking at 10.7 % in 2020.   On her initial visit to our clinic, she had an A1c of 11.8%. She was on lantus/humalog but was not taking regularly, switched to insulin mix for ease of take.    Was on Lipitor but caused elevated LFT's requiring hospitalization.  Moved from [place] to help her son who is on peritoneal dialysis. Has lost another son to MI    By 06/2019 we attempted to put her on the V-Go but was cost prohibitive.   By 09/2019 we switched insulin mix to MDi regimen due to persistent hyperglycemia  SUBJECTIVE:   During the last visit (10/13/2019): A1c 10.7 %, Stopped Humalog mix, started MRI regimen     Today (02/13/2020): [name] is here for a follow up on diabetes.  She checks her blood sugars multiple times daily,through CGM.  preprandial to breakfast. The patient has had hypoglycemic episodes since the last clinic visit, which typically occur 1 x / month- most often occuring in the morning . The patient is symptomatic with these episodes.      Denies nausea or diarrhea , has occasional constipation     HOME DIABETES REGIMEN:  Toujeo 45 units once daily Humalog 12 units with each meal Rybelsus 3 mg 1 tablet with breakfast CF: Humalog (BG -130/30)      Statin: Off due to  elevated LFT's ACE-I/ARB: Yes     CONTINUOUS GLUCOSE MONITORING RECORD INTERPRETATION    Dates of Recording: 9/3- 10/13/2019  Sensor description:freestyle libre  Results statistics:   CGM use % of time 69  Average and SD 288/32.8  Time in range    15    %  % Time Above 180 18  % Time above 250 66  % Time Below target 1    Glycemic patterns summary: Hyperglycemia all day and night   Hyperglycemic episodes  All day and night   Hypoglycemic episodes occurred early hours of the morning   Overnight periods: variable between hypo and hyperglycemia         DIABETIC COMPLICATIONS: Microvascular complications:   CKD III, DR , neuropathy   Last eye exam: Completed > 1 yr   Macrovascular complications:    Denies: CAD, PVD, CVA   HISTORY:  Past Medical History:  Past Medical History:  Diagnosis Date  . CKD (chronic kidney disease)   . Diabetes mellitus without complication ([place])   . Hyperlipidemia   . Hypertension    Past Surgical History:  Past Surgical History:  Procedure Laterality Date  . BREAST BIOPSY    . BREAST EXCISIONAL BIOPSY Left     Social History:  reports that she has never smoked. She has never used smokeless tobacco. She reports that she does not drink alcohol and does not use drugs. Family  History:  Family History  Problem Relation Age of Onset  . Skin cancer Mother   . Heart attack Father   . Colon cancer Sister      HOME MEDICATIONS: Allergies as of 02/13/2020      Reactions   Lisinopril Cough   Lipitor [atorvastatin] Other (See Comments)   Liver function   Penicillins Hives, Itching   Did it involve swelling of the face/tongue/throat, SOB, or low BP? No Did it involve sudden or severe rash/hives, skin peeling, or any reaction on the inside of your mouth or nose? Yes Did you need to seek medical attention at a hospital or doctor's office? Yes When did it last happen?2012 If all above answers are "NO", may proceed with  cephalosporin use.   Septra [sulfamethoxazole-trimethoprim] Hives      Medication List       Accurate as of February 13, 2020  7:14 AM. If you have any questions, ask your nurse or doctor.        acetaminophen 500 MG tablet Commonly known as: TYLENOL Take 500 mg by mouth every 4 (four) hours as needed for headache (pain).   albuterol 108 (90 Base) MCG/ACT inhaler Commonly known as: VENTOLIN HFA Inhale 2 puffs into the lungs every 6 (six) hours as needed for wheezing or shortness of breath.   allopurinol 100 MG tablet Commonly known as: ZYLOPRIM Take 1 tablet (100 mg total) by mouth daily. Must have office visit for refills   amLODipine 10 MG tablet Commonly known as: NORVASC TAKE 1 TABLET BY MOUTH DAILY   amLODIPine Benzoate 1 MG/ML Susp Take 10 mg by mouth.   cilostazol 50 MG tablet Commonly known as: PLETAL Take 1 tablet (50 mg total) by mouth 2 (two) times daily.   ciprofloxacin 500 MG tablet Commonly known as: Cipro Take 1 tablet (500 mg total) by mouth daily with breakfast.   COLACE PO Take by mouth as needed.   ezetimibe 10 MG tablet Commonly known as: ZETIA Take 1 tablet (10 mg total) by mouth daily.   febuxostat 40 MG tablet Commonly known as: ULORIC Take 40 mg by mouth daily.   fluticasone 50 MCG/ACT nasal spray Commonly known as: FLONASE Place 2 sprays into both nostrils daily.   FreeStyle Libre 2 Sensor Misc Inject 1 Device into the skin as directed. Use as directed every 14 days. E11.21   insulin lispro 100 UNIT/ML KwikPen Commonly known as: HumaLOG KwikPen Inject 12 Units into the skin 3 (three) times daily. Max daily 70 units with correction scale   Insulin Syringes (Disposable) U-100 1 ML Misc 1 Device by Does not apply route daily.   loratadine 10 MG tablet Commonly known as: CLARITIN Take 10 mg by mouth daily as needed (seasonal allergies).   losartan 25 MG tablet Commonly known as: COZAAR Take 1 tablet (25 mg total) by mouth  daily.   Misc. Devices Misc Please provide patient with insurance approved blood pressure monitor.   Pen Needles 32G X 4 MM Misc 1 Device by Does not apply route 2 (two) times daily.   REFRESH TEARS OP Place 1 drop into both eyes at bedtime.   rosuvastatin 40 MG tablet Commonly known as: CRESTOR Take 1 tablet (40 mg total) by mouth daily.   Rybelsus 3 MG Tabs Generic drug: Semaglutide Take 3 mg by mouth daily with breakfast.   Toujeo Max SoloStar 300 UNIT/ML Solostar Pen Generic drug: insulin glargine (2 Unit Dial) Inject 46 Units into the skin daily.  VITAMIN C PO Take 1 tablet by mouth 2 (two) times a week.   Vitamin D (Ergocalciferol) 1.25 MG (50000 UNIT) Caps capsule Commonly known as: DRISDOL TAKE 1 CAPSULE BY MOUTH EVERY 7 DAYS        OBJECTIVE:   Vital Signs: BP 134/78   Pulse (!) 107   Ht '5\' 4"'$  (1.626 m)   Wt 207 lb 4 oz (94 kg)   SpO2 98%   BMI 35.57 kg/m   Wt Readings from Last 3 Encounters:  12/06/19 208 lb 3.2 oz (94.4 kg)  11/14/19 211 lb 3.2 oz (95.8 kg)  10/13/19 210 lb (95.3 kg)     Exam: General: Pt appears well and is in NAD  Lungs: Clear with good BS bilat with no rales, rhonchi, or wheezes  Heart: RRR with normal S1 and S2 and no gallops; no murmurs; no rub  Abdomen:  Soft with supra pubic tenderness  Extremities: Trace pretibial edema.  Neuro: MS is good with appropriate affect, pt is alert and Ox3       DM foot exam: 11/2019  The skin of the feet is intact without sores or ulcerations. The pedal pulses are 2+ on right and 2+ on left. The sensation is intact to a screening 5.07, 10 gram monofilament bilaterally    DATA REVIEWED:  Lab Results  Component Value Date   HGBA1C 10.7 (A) 10/13/2019   HGBA1C 12.2 (A) 06/07/2019   HGBA1C 11.8 (A) 11/30/2018   Lab Results  Component Value Date   LDLCALC 54 05/06/2019   CREATININE 2.17 (H) 05/06/2019     Lab Results  Component Value Date   CHOL 114 05/06/2019   HDL 35  (L) 05/06/2019   LDLCALC 54 05/06/2019   TRIG 145 05/06/2019   CHOLHDL 3.3 05/06/2019         ASSESSMENT / PLAN / RECOMMENDATIONS:   1) Type 2 Diabetes Mellitus, Poorly controlled, With CKD III, retinopathy and neuropathic complications - Most recent A1c of 8.5 %. Goal A1c < 7.5 %.      - A1c improving  - Tolerating Rybelsus without side effects - Unable to download freestyle libre, but looking at the log book manually, there's drastic changes in her BG's ranging from hypoglycemia to 300's  -We have attempted to put her on the V-go but that was cost prohibitive  MEDICATIONS: Decreased Toujeo to 40 units once daily Decrease Humalog 10 units with each meal -Increase Rybelsus 7 mg 1 tablet with breakfast -CF: Humalog (BG -130/30)     EDUCATION / INSTRUCTIONS:  BG monitoring instructions: Patient is instructed to check her blood sugars 3 times a day, before meals . I reviewed the Rule of 15 for the treatment of hypoglycemia in detail with the patient. Literature supplied.      F/U in 4 months    Signed electronically by: Mack Guise, MD  [facility] Endocrinology  [facility] [address] [place], [place] [address] Phone: [phone] FAX: [phone]   CC: Leeroy Cha, [place] [place] [address] Phone: [phone]  Fax: [phone]  Return to Endocrinology clinic as below: Future Appointments  Date Time Provider [place]  02/13/2020  9:30 AM Yenifer Saccente, Melanie Crazier, MD [facility] None  04/03/2020 11:40 AM Wellington Hampshire, MD [facility]  05/07/2020  9:00 AM [facility] VASC 1 [facility]

## 2020-02-13 NOTE — Patient Instructions (Signed)
-   Increase Rybelsus to 7 mg, 1 tablet before Breakfast  - Decrease Toujeo to 40  units once daily  - Decrease  Humalog to 10  units with each meal  -Humalog correctional insulin: ADD extra units on insulin to your meal-time Humalog dose if your blood sugars are higher than 160. Use the scale below to help guide you:   Blood sugar before meal Number of units to inject  Less than 160 0 unit  161 -  190 1 units  191 -  220 2 units  221 -  250 3 units  251 -  280 4 units  281 -  310 5 units  311 -  340 6 units  341 -  370 7 units  371 -  400 8 units  401 - 430 9 units         HOW TO TREAT LOW BLOOD SUGARS (Blood sugar LESS THAN 70 MG/DL)  Please follow the RULE OF 15 for the treatment of hypoglycemia treatment (when your (blood sugars are less than 70 mg/dL)    STEP 1: Take 15 grams of carbohydrates when your blood sugar is low, which includes:   3-4 GLUCOSE TABS  OR  3-4 OZ OF JUICE OR REGULAR SODA OR  ONE TUBE OF GLUCOSE GEL     STEP 2: RECHECK blood sugar in 15 MINUTES STEP 3: If your blood sugar is still low at the 15 minute recheck --> then, go back to STEP 1 and treat AGAIN with another 15 grams of carbohydrates.

## 2020-02-14 MED ORDER — INSULIN LISPRO (1 UNIT DIAL) 100 UNIT/ML (KWIKPEN)
10.0000 [IU] | PEN_INJECTOR | Freq: Three times a day (TID) | SUBCUTANEOUS | 6 refills | Status: DC
Start: 2020-02-14 — End: 2020-06-12

## 2020-02-14 MED ORDER — TOUJEO MAX SOLOSTAR 300 UNIT/ML ~~LOC~~ SOPN: 40.0000 [IU] | PEN_INJECTOR | Freq: Every day | SUBCUTANEOUS | 6 refills | Status: DC

## 2020-03-28 ENCOUNTER — Other Ambulatory Visit: Payer: Self-pay | Admitting: Internal Medicine

## 2020-03-28 DIAGNOSIS — Z1231 Encounter for screening mammogram for malignant neoplasm of breast: Secondary | ICD-10-CM

## 2020-04-03 ENCOUNTER — Encounter: Payer: Self-pay | Admitting: Cardiovascular Disease

## 2020-04-03 ENCOUNTER — Ambulatory Visit (INDEPENDENT_AMBULATORY_CARE_PROVIDER_SITE_OTHER): Payer: Medicare Other | Admitting: Cardiovascular Disease

## 2020-04-03 ENCOUNTER — Other Ambulatory Visit: Payer: Self-pay

## 2020-04-03 VITALS — BP 140/71 | HR 99 | Ht 64.0 in | Wt 204.4 lb

## 2020-04-03 DIAGNOSIS — I739 Peripheral vascular disease, unspecified: Secondary | ICD-10-CM | POA: Diagnosis not present

## 2020-04-03 DIAGNOSIS — E785 Hyperlipidemia, unspecified: Secondary | ICD-10-CM | POA: Diagnosis not present

## 2020-04-03 DIAGNOSIS — I10 Essential (primary) hypertension: Secondary | ICD-10-CM | POA: Diagnosis not present

## 2020-04-03 NOTE — Progress Notes (Signed)
Cardiology Office Note   Date:  04/03/2020   ID:  Franklyn, Damien 13-Sep-1950, MRN [id number]  PCP:  Leeroy Cha, MD  Cardiologist: Dr. Audie Box  No chief complaint on file.     History of Present Illness: Hayley Case is a 70 y.o. female who is here today for follow-up visit regarding peripheral arterial disease.   She has known history of diabetes mellitus, hyperlipidemia, hypertension, chronic kidney disease and carotid artery disease. Cardiac testing in 2020 was unremarkable including an echocardiogram and a Lexiscan Myoview.  She was seen few months ago for right calf claudication without rest pain or lower extremity ulceration.  Noninvasive vascular studies showed an ABI of 0.59 on the right and 1.01 on the left.  Duplex showed borderline significant stenosis in the right SFA/popliteal artery although an occlusion that could not be completely excluded given significant medial calcification.  Most recent creatinine was 2.17 with a GFR of 26.  She was started on small cilostazol.  She reports some improvement in claudication but noticed GI side effects with the medication.  No significant change in symptoms overall.  Past Medical History:  Diagnosis Date  . CKD (chronic kidney disease)   . Diabetes mellitus without complication ([place])   . Hyperlipidemia   . Hypertension     Past Surgical History:  Procedure Laterality Date  . BREAST BIOPSY    . BREAST EXCISIONAL BIOPSY Left      Current Outpatient Medications  Medication Sig Dispense Refill  . acetaminophen (TYLENOL) 500 MG tablet Take 500 mg by mouth every 4 (four) hours as needed for headache (pain).    Marland Kitchen albuterol (VENTOLIN HFA) 108 (90 Base) MCG/ACT inhaler Inhale 2 puffs into the lungs every 6 (six) hours as needed for wheezing or shortness of breath. 1 g 3  . allopurinol (ZYLOPRIM) 100 MG tablet Take 1 tablet (100 mg total) by mouth daily. Must have office visit for refills 30 tablet 0  .  amLODipine (NORVASC) 10 MG tablet TAKE 1 TABLET BY MOUTH DAILY 90 tablet 3  . amLODIPine Benzoate 1 MG/ML SUSP Take 10 mg by mouth.    . Ascorbic Acid (VITAMIN C PO) Take 1 tablet by mouth 2 (two) times a week.    . Carboxymethylcellulose Sodium (REFRESH TEARS OP) Place 1 drop into both eyes at bedtime.     . cilostazol (PLETAL) 50 MG tablet Take 1 tablet (50 mg total) by mouth 2 (two) times daily. 60 tablet 5  . Continuous Blood Gluc Sensor (FREESTYLE LIBRE 2 SENSOR) MISC Inject 1 Device into the skin as directed. Use as directed every 14 days. E11.21 12 each 3  . Docusate Sodium (COLACE PO) Take by mouth as needed.    . ezetimibe (ZETIA) 10 MG tablet Take 1 tablet (10 mg total) by mouth daily. 90 tablet 1  . febuxostat (ULORIC) 40 MG tablet Take 40 mg by mouth daily.    . fluticasone (FLONASE) 50 MCG/ACT nasal spray Place 2 sprays into both nostrils daily. 16 g 6  . insulin glargine, 2 Unit Dial, (TOUJEO MAX SOLOSTAR) 300 UNIT/ML Solostar Pen Inject 40 Units into the skin daily. 15 mL 6  . insulin lispro (HUMALOG KWIKPEN) 100 UNIT/ML KwikPen Inject 10 Units into the skin 3 (three) times daily. Max daily 70 units with correction scale 30 mL 6  . Insulin Pen Needle (PEN NEEDLES) 32G X 4 MM MISC 1 Device by Does not apply route 2 (two) times daily. 100 each  11  . Insulin Syringes, Disposable, U-100 1 ML MISC 1 Device by Does not apply route daily. 100 each 11  . loratadine (CLARITIN) 10 MG tablet Take 10 mg by mouth daily as needed (seasonal allergies).    . losartan (COZAAR) 25 MG tablet Take 1 tablet (25 mg total) by mouth daily. 90 tablet 3  . Misc. Devices MISC Please provide patient with insurance approved blood pressure monitor. 1 each 0  . Semaglutide (RYBELSUS) 7 MG TABS Take 7 mg by mouth daily at 2 PM. 90 tablet 2  . Vitamin D, Ergocalciferol, (DRISDOL) 1.25 MG (50000 UT) CAPS capsule TAKE 1 CAPSULE BY MOUTH EVERY 7 DAYS 12 capsule 1  . rosuvastatin (CRESTOR) 40 MG tablet Take 1 tablet  (40 mg total) by mouth daily. 90 tablet 3   No current facility-administered medications for this visit.    Allergies:   Lisinopril, Lipitor [atorvastatin], Penicillins, and Septra [sulfamethoxazole-trimethoprim]    Social History:  The patient  reports that she has never smoked. She has never used smokeless tobacco. She reports that she does not drink alcohol and does not use drugs.   Family History:  The patient's family history includes Colon cancer in her sister; Heart attack in her father; Skin cancer in her mother.    ROS:  Please see the history of present illness.   Otherwise, review of systems are positive for none.   All other systems are reviewed and negative.    PHYSICAL EXAM: VS:  BP 140/71   Pulse 99   Ht '5\' 4"'$  (1.626 m)   Wt 204 lb 6.4 oz (92.7 kg)   SpO2 99%   BMI 35.09 kg/m  , BMI Body mass index is 35.09 kg/m. GEN: Well nourished, well developed, in no acute distress  HEENT: normal  Neck: no JVD,  or masses.  Right carotid bruit Cardiac: RRR; no murmurs, rubs, or gallops,no edema  Respiratory:  clear to auscultation bilaterally, normal work of breathing GI: soft, nontender, nondistended, + BS MS: no deformity or atrophy  Skin: warm and dry, no rash Neuro:  Strength and sensation are intact Psych: euthymic mood, full affect Vascular: Femoral pulses normal bilaterally.  Distal pulses are palpable on the left side only.   EKG:  EKG is not ordered today.    Recent Labs: 05/06/2019: ALT 26; BUN 30; Creatinine, Ser 2.17; Potassium 4.9; Sodium 137    Lipid Panel    Component Value Date/Time   CHOL 114 05/06/2019 1222   TRIG 145 05/06/2019 1222   HDL 35 (L) 05/06/2019 1222   CHOLHDL 3.3 05/06/2019 1222   LDLCALC 54 05/06/2019 1222      Wt Readings from Last 3 Encounters:  04/03/20 204 lb 6.4 oz (92.7 kg)  02/13/20 207 lb 4 oz (94 kg)  12/06/19 208 lb 3.2 oz (94.4 kg)       No flowsheet data found.    ASSESSMENT AND PLAN:  1.  Peripheral  arterial disease with severe right calf claudication [place] class III.  She had some improvement with cilostazol but she reports some upset stomach which is likely a side effect.  I elected to discontinue the medication.  Right calf claudication seems to be stable overall.  Continue treatment of risk factors and a walking program.  Reserve angiography for worsening symptoms given underlying chronic kidney disease.  2.  Essential hypertension: Blood pressures controlled on current medications.  3.  Hyperlipidemia: Currently on rosuvastatin and Zetia.  Most recent lipid profile showed  an LDL of 54.  4.  Chronic kidney disease: Most recent GFR was 26.    Disposition:   FU with me in 6 months  Signed,  Kathlyn Sacramento, MD  04/03/2020 11:38 AM    [place]

## 2020-04-03 NOTE — Patient Instructions (Signed)
Medication Instructions:  STOP PLETAL  *If you need a refill on your cardiac medications before your next appointment, please call your pharmacy*  Follow-Up: At [facility], you and your health needs are our priority.  As part of our continuing mission to provide you with exceptional heart care, we have created designated Provider Care Teams.  These Care Teams include your primary Cardiologist (physician) and Advanced Practice Providers (APPs -  Physician Assistants and Nurse Practitioners) who all work together to provide you with the care you need, when you need it.  Your next appointment:   6 month(s)  The format for your next appointment:   In Person  Provider:   Kathlyn Sacramento, MD

## 2020-05-07 ENCOUNTER — Ambulatory Visit (HOSPITAL_COMMUNITY)
Admission: RE | Admit: 2020-05-07 | Discharge: 2020-05-07 | Disposition: A | Discharge status: Home / Self Care | Payer: Medicare Other | Source: Ambulatory Visit | Attending: Cardiology | Admitting: Cardiology

## 2020-05-07 ENCOUNTER — Other Ambulatory Visit (HOSPITAL_COMMUNITY): Payer: Self-pay | Admitting: Cardiovascular Disease

## 2020-05-07 ENCOUNTER — Other Ambulatory Visit: Payer: Self-pay

## 2020-05-07 DIAGNOSIS — M79604 Pain in right leg: Secondary | ICD-10-CM

## 2020-05-07 DIAGNOSIS — I739 Peripheral vascular disease, unspecified: Secondary | ICD-10-CM

## 2020-05-07 DIAGNOSIS — I10 Essential (primary) hypertension: Secondary | ICD-10-CM | POA: Diagnosis not present

## 2020-05-07 DIAGNOSIS — E782 Mixed hyperlipidemia: Secondary | ICD-10-CM

## 2020-05-07 DIAGNOSIS — I6523 Occlusion and stenosis of bilateral carotid arteries: Secondary | ICD-10-CM

## 2020-06-04 NOTE — Progress Notes (Signed)
Cardiology Office Note:   Date:  06/06/2020  NAME:  Hayley Case    MRN: [id number] DOB:  05-Mar-1950   PCP:  Leeroy Cha, MD  Cardiologist:  Evalina Field, MD  Electrophysiologist:  None   Referring MD: Leeroy Cha,*   Chief Complaint  Patient presents with  . Follow-up   History of Present Illness:   Hayley Case is a 70 y.o. female with a hx of HTN, obesity, DM, PAD, carotid artery disease, CKD 3/4, OSA who presents for follow-up. Diagnosed with PAD. Conservative management recommended given CKD. Blood pressure slightly elevated 158/74.  It is been well controlled on recent visits.  I recommended she check this periodically.  She denies any chest pain or shortness of breath.  Her leg pain has improved.  Diabetes number has improved with an A1c of 8.5.  Kidney function is stable.  She reports that she is still not exercising.  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  She reports this is quite exhausting work.  Nonetheless she denies any significant chest pain or shortness of breath.  Most recent LDL cholesterol bit high.  She was closer to goal on her last visit.  I recommended continued medications as well as glucose control.  Carotid ultrasound show her left ICA is unchanged.  Problem List 1. HTN 2. Obesity (BMI 36) 3. Diabetes -A1c8.5 -T chol 140, HDL 38, LDL 79, triglycerides 132 4. Carotid artery disease 03/09/2019 -R ICA 1-39% -L ICA 40-59% 5. 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 PAD -R SFA 50-74% -L popliteal 50-74%  Past Medical History: Past Medical History:  Diagnosis Date  . CKD (chronic kidney disease)   . Diabetes mellitus without complication ([place])   . Hyperlipidemia   . Hypertension     Past Surgical History: Past Surgical History:  Procedure Laterality Date  . BREAST BIOPSY    . BREAST EXCISIONAL BIOPSY Left     Current Medications: Current Meds  Medication Sig  . acetaminophen (TYLENOL) 500 MG tablet Take 500 mg by mouth every 4  (four) hours as needed for headache (pain).  Marland Kitchen albuterol (VENTOLIN HFA) 108 (90 Base) MCG/ACT inhaler Inhale 2 puffs into the lungs every 6 (six) hours as needed for wheezing or shortness of breath.  Marland Kitchen amLODipine (NORVASC) 10 MG tablet TAKE 1 TABLET BY MOUTH DAILY  . amLODIPine Benzoate 1 MG/ML SUSP Take 10 mg by mouth.  . Carboxymethylcellulose Sodium (REFRESH TEARS OP) Place 1 drop into both eyes at bedtime.   . Continuous Blood Gluc Sensor (FREESTYLE LIBRE 2 SENSOR) MISC Inject 1 Device into the skin as directed. Use as directed every 14 days. E11.21  . Docusate Sodium (COLACE PO) Take by mouth as needed.  . ezetimibe (ZETIA) 10 MG tablet Take 1 tablet (10 mg total) by mouth daily.  . insulin glargine, 2 Unit Dial, (TOUJEO MAX SOLOSTAR) 300 UNIT/ML Solostar Pen Inject 40 Units into the skin daily.  . insulin lispro (HUMALOG KWIKPEN) 100 UNIT/ML KwikPen Inject 10 Units into the skin 3 (three) times daily. Max daily 70 units with correction scale  . Insulin Pen Needle (PEN NEEDLES) 32G X 4 MM MISC 1 Device by Does not apply route 2 (two) times daily.  . Insulin Syringes, Disposable, U-100 1 ML MISC 1 Device by Does not apply route daily.  Marland Kitchen loratadine (CLARITIN) 10 MG tablet Take 10 mg by mouth daily as needed (seasonal allergies).  . losartan (COZAAR) 25 MG tablet Take 1 tablet (25  mg total) by mouth daily.  . Misc. Devices MISC Please provide patient with insurance approved blood pressure monitor.  . Semaglutide (RYBELSUS) 7 MG TABS Take 7 mg by mouth daily at 2 PM.  . Vitamin D, Ergocalciferol, (DRISDOL) 1.25 MG (50000 UT) CAPS capsule TAKE 1 CAPSULE BY MOUTH EVERY 7 DAYS  . [DISCONTINUED] allopurinol (ZYLOPRIM) 100 MG tablet Take 1 tablet (100 mg total) by mouth daily. Must have office visit for refills     Allergies:    Lisinopril, Lipitor [atorvastatin], Penicillins, and Septra [sulfamethoxazole-trimethoprim]   Social History: Social History   Socioeconomic History  . Marital  status: Widowed    Spouse name: Not on file  . Number of children: Not on file  . Years of education: Not on file  . Highest education level: Not on file  Occupational History  . Not on file  Tobacco Use  . Smoking status: Never Smoker  . Smokeless tobacco: Never Used  Substance and Sexual Activity  . Alcohol use: No  . Drug use: No  . Sexual activity: Not Currently  Other Topics Concern  . Not on file  Social History Narrative  . Not on file   Social Determinants of Health   Financial Resource Strain: Not on file  Food Insecurity: Not on file  Transportation Needs: Unmet Transportation Needs  . Lack of Transportation (Medical): Yes  . Lack of Transportation (Non-Medical): Yes  Physical Activity: Not on file  Stress: Not on file  Social Connections: Not on file     Family History: The patient's family history includes Colon cancer in her sister; Heart attack in her father; Skin cancer in her mother.  ROS:   All other ROS reviewed and negative. Pertinent positives noted in the HPI.     EKGs/Labs/Other Studies Reviewed:   The following studies were personally reviewed by me today:  Recent Labs: No results found for requested labs within last 8760 hours.   Recent Lipid Panel    Component Value Date/Time   CHOL 114 05/06/2019 1222   TRIG 145 05/06/2019 1222   HDL 35 (L) 05/06/2019 1222   CHOLHDL 3.3 05/06/2019 1222   LDLCALC 54 05/06/2019 1222    Physical Exam:   VS:  BP (!) 158/74   Pulse 88   Ht '5\' 4"'$  (1.626 m)   Wt 200 lb (90.7 kg)   SpO2 100%   BMI 34.33 kg/m    Wt Readings from Last 3 Encounters:  06/06/20 200 lb (90.7 kg)  04/03/20 204 lb 6.4 oz (92.7 kg)  02/13/20 207 lb 4 oz (94 kg)    General: Well nourished, well developed, in no acute distress Head: Atraumatic, normal size  Eyes: PEERLA, EOMI  Neck: Supple, left carotid bruit Endocrine: No thryomegaly Cardiac: Normal S1, S2; RRR; no murmurs, rubs, or gallops Lungs: Clear to auscultation  bilaterally, no wheezing, rhonchi or rales  Abd: Soft, nontender, no hepatomegaly  Ext: Poor pulses bilaterally Musculoskeletal: No deformities, BUE and BLE strength normal and equal Skin: Warm and dry, no rashes   Neuro: Alert and oriented to person, place, time, and situation, CNII-XII grossly intact, no focal deficits  Psych: Normal mood and affect   ASSESSMENT:   Hayley Case is a 70 y.o. female who presents for the following: 1. Bilateral carotid artery stenosis   2. PAD (peripheral artery disease) ([place])   3. Mixed hyperlipidemia   4. Essential hypertension     PLAN:   1. Bilateral carotid artery stenosis -Right  ICA 1-39%, left ICA 40-59% -No strokelike symptoms.  She will continue aspirin as well as statin medications.  She is on Crestor and Zetia.  Most recent LDL 79.  This is close to goal.  2. PAD (peripheral artery disease) ([place]) -Right SFA 50-74%, left popliteal 50-74% -Followed by vascular.  Symptoms of claudication have improved. -Continue with aspirin as well as blood pressure control and lipid-lowering agents. -Nuclear medicine stress test 09/24/2018 shows normal perfusion.  3. Mixed hyperlipidemia -Continue Crestor 40 mg daily.  Continue Zetia 10 mg daily.  Most recent LDL 79 which is close to goal.  4. Essential hypertension -BP normally well controlled.  Slightly elevated today.  I recommended that she closely monitor this.  Problem List 1. HTN 2. Obesity (BMI 36) 3. Diabetes -A1c8.5 -T chol 140, HDL 38, LDL 79, triglycerides 132 4. Carotid artery disease 03/09/2019 -R ICA 1-39% -L ICA 40-59% 5. 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 PAD -R SFA 50-74% -L popliteal 50-74% -Normal MPI 09/24/2018   Disposition: Return in about 1 year (around 06/06/2021).  Medication Adjustments/Labs and Tests Ordered: Current medicines are reviewed at length with the patient today.  Concerns regarding medicines are outlined above.  Orders Placed This Encounter  Procedures  .  VAS US CAROTID   No orders of the defined types were placed in this encounter.   Patient Instructions  Medication Instructions:  The current medical regimen is effective;  continue present plan and medications.  *If you need a refill on your cardiac medications before your next appointment, please call your pharmacy*   Testing/Procedures: Your physician has requested that you have a carotid duplex (7month). This test is an ultrasound of the carotid arteries in your neck. It looks at blood flow through these arteries that supply the brain with blood. Allow one hour for this exam. There are no restrictions or special instructions.   Follow-Up: [facility] you and your health needs are our priority.  As part of our continuing mission to provide you with exceptional heart care, we have created designated Provider Care Teams.  These Care Teams include your primary Cardiologist (physician) and Advanced Practice Providers (APPs -  Physician Assistants and Nurse Practitioners) who all work together to provide you with the care you need, when you need it.  We recommend signing up for the patient portal called "MyChart".  Sign up information is provided on this After Visit Summary.  MyChart is used to connect with patients for Virtual Visits (Telemedicine).  Patients are able to view lab/test results, encounter notes, upcoming appointments, etc.  Non-urgent messages can be sent to your provider as well.   To learn more about what you can do with MyChart, go to [email or web address]    Your next appointment:   12 month(s)  The format for your next appointment:   In Person  Provider:   WEleonore Chiquito MD       Time Spent with Patient: I have spent a total of 25 minutes with patient reviewing hospital notes, telemetry, EKGs, labs and examining the patient as well as establishing an assessment and plan that was discussed with the patient.  > 50% of time was spent in direct patient  care.  Signed, WAddison Naegeli OAudie Box MD, [place] [address] [place] [phone] 06/06/2020 11:46 AM

## 2020-06-06 ENCOUNTER — Ambulatory Visit (INDEPENDENT_AMBULATORY_CARE_PROVIDER_SITE_OTHER): Payer: Medicare Other | Admitting: Cardiovascular Disease

## 2020-06-06 ENCOUNTER — Other Ambulatory Visit: Payer: Self-pay

## 2020-06-06 ENCOUNTER — Encounter: Payer: Self-pay | Admitting: Cardiovascular Disease

## 2020-06-06 VITALS — BP 158/74 | HR 88 | Ht 64.0 in | Wt 200.0 lb

## 2020-06-06 DIAGNOSIS — I10 Essential (primary) hypertension: Secondary | ICD-10-CM

## 2020-06-06 DIAGNOSIS — E782 Mixed hyperlipidemia: Secondary | ICD-10-CM | POA: Diagnosis not present

## 2020-06-06 DIAGNOSIS — I739 Peripheral vascular disease, unspecified: Secondary | ICD-10-CM | POA: Diagnosis not present

## 2020-06-06 DIAGNOSIS — I6523 Occlusion and stenosis of bilateral carotid arteries: Secondary | ICD-10-CM

## 2020-06-06 NOTE — Patient Instructions (Signed)
Medication Instructions:  The current medical regimen is effective;  continue present plan and medications.  *If you need a refill on your cardiac medications before your next appointment, please call your pharmacy*   Testing/Procedures: Your physician has requested that you have a carotid duplex (12 months). This test is an ultrasound of the carotid arteries in your neck. It looks at blood flow through these arteries that supply the brain with blood. Allow one hour for this exam. There are no restrictions or special instructions.    Follow-Up: At [facility], you and your health needs are our priority.  As part of our continuing mission to provide you with exceptional heart care, we have created designated Provider Care Teams.  These Care Teams include your primary Cardiologist (physician) and Advanced Practice Providers (APPs -  Physician Assistants and Nurse Practitioners) who all work together to provide you with the care you need, when you need it.  We recommend signing up for the patient portal called "MyChart".  Sign up information is provided on this After Visit Summary.  MyChart is used to connect with patients for Virtual Visits (Telemedicine).  Patients are able to view lab/test results, encounter notes, upcoming appointments, etc.  Non-urgent messages can be sent to your provider as well.   To learn more about what you can do with MyChart, go to [email or web address].    Your next appointment:   12 month(s)  The format for your next appointment:   In Person  Provider:   Lonoke O'Neal, MD          

## 2020-06-12 ENCOUNTER — Ambulatory Visit (INDEPENDENT_AMBULATORY_CARE_PROVIDER_SITE_OTHER): Payer: Medicare Other | Admitting: Internal Medicine

## 2020-06-12 ENCOUNTER — Other Ambulatory Visit: Payer: Self-pay

## 2020-06-12 VITALS — BP 142/72 | HR 93 | Ht 64.0 in | Wt 199.2 lb

## 2020-06-12 DIAGNOSIS — E1122 Type 2 diabetes mellitus with diabetic chronic kidney disease: Secondary | ICD-10-CM | POA: Diagnosis not present

## 2020-06-12 DIAGNOSIS — E11319 Type 2 diabetes mellitus with unspecified diabetic retinopathy without macular edema: Secondary | ICD-10-CM

## 2020-06-12 DIAGNOSIS — Z794 Long term (current) use of insulin: Secondary | ICD-10-CM | POA: Diagnosis not present

## 2020-06-12 DIAGNOSIS — E1142 Type 2 diabetes mellitus with diabetic polyneuropathy: Secondary | ICD-10-CM

## 2020-06-12 DIAGNOSIS — N1832 Chronic kidney disease, stage 3b: Secondary | ICD-10-CM

## 2020-06-12 LAB — POCT GLYCOSYLATED HEMOGLOBIN (HGB A1C): Hemoglobin A1C: 8.4 % — AB (ref 4.0–5.6)

## 2020-06-12 MED ORDER — RYBELSUS 7 MG PO TABS: 7.0000 mg | ORAL_TABLET | Freq: Every day | ORAL | 3 refills | Status: DC

## 2020-06-12 MED ORDER — TOUJEO MAX SOLOSTAR 300 UNIT/ML ~~LOC~~ SOPN
36.0000 [IU] | PEN_INJECTOR | Freq: Every day | SUBCUTANEOUS | 6 refills | Status: DC
Start: 2020-06-12 — End: 2021-07-02

## 2020-06-12 MED ORDER — HUMALOG KWIKPEN 200 UNIT/ML ~~LOC~~ SOPN: 12.0000 [IU] | PEN_INJECTOR | Freq: Three times a day (TID) | SUBCUTANEOUS | 3 refills | Status: DC

## 2020-06-12 MED ORDER — PEN NEEDLES 32G X 4 MM MISC
1.0000 | Freq: Four times a day (QID) | 3 refills | Status: DC
Start: 2020-06-12 — End: 2021-06-21

## 2020-06-12 NOTE — Patient Instructions (Signed)
-   Continue Rybelsus 7 mg, 1 tablet before Breakfast  - Decrease Toujeo to 36 units once daily  - Increase  Humalog 12  units with each meal  - Take Humalog 5 units with a bedtime Snack  -Humalog correctional insulin: ADD extra units on insulin to your meal-time Humalog dose if your blood sugars are higher than 160. Use the scale below to help guide you:   Blood sugar before meal Number of units to inject  Less than 160 0 unit  161 -  190 1 units  191 -  220 2 units  221 -  250 3 units  251 -  280 4 units  281 -  310 5 units  311 -  340 6 units  341 -  370 7 units  371 -  400 8 units  401 - 430 9 units         HOW TO TREAT LOW BLOOD SUGARS (Blood sugar LESS THAN 70 MG/DL)  Please follow the RULE OF 15 for the treatment of hypoglycemia treatment (when your (blood sugars are less than 70 mg/dL)    STEP 1: Take 15 grams of carbohydrates when your blood sugar is low, which includes:   3-4 GLUCOSE TABS  OR  3-4 OZ OF JUICE OR REGULAR SODA OR  ONE TUBE OF GLUCOSE GEL     STEP 2: RECHECK blood sugar in 15 MINUTES STEP 3: If your blood sugar is still low at the 15 minute recheck --> then, go back to STEP 1 and treat AGAIN with another 15 grams of carbohydrates.

## 2020-06-12 NOTE — Progress Notes (Signed)
Name: Hayley Case  Age/ Sex: 70 y.o., female   MRN/ DOB: [id number], Dec 05, 1950     PCP: Leeroy Cha, MD   Reason for Endocrinology Evaluation: Type 2 Diabetes Mellitus  Initial Endocrine Consultative Visit: 11/30/2018    PATIENT IDENTIFIER: [name] is a 70 y.o. female with a past medical history of HTN, T2DM, PAD, coronary artery disease and Dyslipidemia . The patient has followed with Endocrinology clinic since 11/30/2018 for consultative assistance with management of her diabetes.  DIABETIC HISTORY:  Hayley Case was diagnosed with T2DM > 20 yrs ago. She was on metformin, Trulicity, and Byetta. Has been on insulin since ~ 2010.  Her hemoglobin A1c has ranged from 7.6 % in 2019, peaking at 10.7 % in 2020.   On her initial visit to our clinic, she had an A1c of 11.8%. She was on lantus/humalog but was not taking regularly, switched to insulin mix for ease of take.    Was on Lipitor but caused elevated LFT's requiring hospitalization.  Moved from [place] to help her son who is on peritoneal dialysis. Has lost another son to MI    By 06/2019 we attempted to put her on the V-Go but was cost prohibitive.   By 09/2019 we switched insulin mix to MDi regimen due to persistent hyperglycemia  SUBJECTIVE:   During the last visit (02/13/2020): A1c 8.5 %, Stopped Humalog mix, started MRI regimen     Today (06/12/2020): Hayley Case is here for a follow up on diabetes.  She checks her blood sugars multiple times daily,through CGM.  preprandial. The patient has had hypoglycemic episodes since the last clinic visit, which typically occur 1 x / month- most often occuring in the morning . The patient is symptomatic with these episodes.      Denies  diarrhea , has occasional constipation  Denies vomiting unless she coughs    HOME DIABETES REGIMEN:  Toujeo 40 units once daily Humalog 10 units with each meal Rybelsus 7 mg 1 tablet with breakfast CF: Humalog (BG  -130/30)      Statin: Off due to elevated LFT's ACE-I/ARB: Yes     CONTINUOUS GLUCOSE MONITORING RECORD INTERPRETATION    Dates of Recording: 5/17-5/30/2022  Sensor description:freestyle libre  Results statistics:   CGM use % of time 64  Average and SD 196/34.3  Time in range    47  %  % Time Above 180 18  % Time above 250 0  % Time Below target 0    Glycemic patterns summary: Hyperglycemia all day and trends down overnight   Hyperglycemic episodes  All day   Hypoglycemic episodes occurred fasting   Overnight periods: Trends down         DIABETIC COMPLICATIONS: Microvascular complications:   CKD III, DR , neuropathy   Last eye exam: Completed > 1 yr   Macrovascular complications:   PVD  Denies: CAD, CVA   HISTORY:  Past Medical History:  Past Medical History:  Diagnosis Date  . CKD (chronic kidney disease)   . Diabetes mellitus without complication ([place])   . Hyperlipidemia   . Hypertension    Past Surgical History:  Past Surgical History:  Procedure Laterality Date  . BREAST BIOPSY    . BREAST EXCISIONAL BIOPSY Left     Social History:  reports that she has never smoked. She has never used smokeless tobacco. She reports that she does not drink alcohol and does not use drugs. Family History:  Family History  Problem Relation Age of Onset  . Skin cancer Mother   . Heart attack Father   . Colon cancer Sister      HOME MEDICATIONS: Allergies as of 06/12/2020      Reactions   Lisinopril Cough   Lipitor [atorvastatin] Other (See Comments)   Liver function   Penicillins Hives, Itching   Did it involve swelling of the face/tongue/throat, SOB, or low BP? No Did it involve sudden or severe rash/hives, skin peeling, or any reaction on the inside of your mouth or nose? Yes Did you need to seek medical attention at a hospital or doctor's office? Yes When did it last happen?2012 If all above answers are "NO", may proceed with  cephalosporin use.   Septra [sulfamethoxazole-trimethoprim] Hives      Medication List       Accurate as of Jun 12, 2020  9:32 AM. If you have any questions, ask your nurse or doctor.        acetaminophen 500 MG tablet Commonly known as: TYLENOL Take 500 mg by mouth every 4 (four) hours as needed for headache (pain).   albuterol 108 (90 Base) MCG/ACT inhaler Commonly known as: VENTOLIN HFA Inhale 2 puffs into the lungs every 6 (six) hours as needed for wheezing or shortness of breath.   amLODipine 10 MG tablet Commonly known as: NORVASC TAKE 1 TABLET BY MOUTH DAILY   COLACE PO Take by mouth as needed.   ezetimibe 10 MG tablet Commonly known as: ZETIA Take 1 tablet (10 mg total) by mouth daily.   FreeStyle Libre 2 Sensor Misc Inject 1 Device into the skin as directed. Use as directed every 14 days. E11.21   insulin lispro 100 UNIT/ML KwikPen Commonly known as: HumaLOG KwikPen Inject 10 Units into the skin 3 (three) times daily. Max daily 70 units with correction scale   Insulin Syringes (Disposable) U-100 1 ML Misc 1 Device by Does not apply route daily.   loratadine 10 MG tablet Commonly known as: CLARITIN Take 10 mg by mouth daily as needed (seasonal allergies).   losartan 25 MG tablet Commonly known as: COZAAR Take 1 tablet (25 mg total) by mouth daily.   Misc. Devices Misc Please provide patient with insurance approved blood pressure monitor.   Pen Needles 32G X 4 MM Misc 1 Device by Does not apply route 2 (two) times daily.   REFRESH TEARS OP Place 1 drop into both eyes at bedtime.   rosuvastatin 40 MG tablet Commonly known as: CRESTOR Take 1 tablet (40 mg total) by mouth daily.   Rybelsus 7 MG Tabs Generic drug: Semaglutide Take 7 mg by mouth daily at 2 PM.   Toujeo Max SoloStar 300 UNIT/ML Solostar Pen Generic drug: insulin glargine (2 Unit Dial) Inject 40 Units into the skin daily.   Vitamin D (Ergocalciferol) 1.25 MG (50000 UNIT) Caps  capsule Commonly known as: DRISDOL TAKE 1 CAPSULE BY MOUTH EVERY 7 DAYS        OBJECTIVE:   Vital Signs: BP (!) 142/72   Pulse 93   Ht '5\' 4"'$  (1.626 m)   Wt 199 lb 4 oz (90.4 kg)   SpO2 98%   BMI 34.20 kg/m   Wt Readings from Last 3 Encounters:  06/12/20 199 lb 4 oz (90.4 kg)  06/06/20 200 lb (90.7 kg)  04/03/20 204 lb 6.4 oz (92.7 kg)     Exam: General: Pt appears well and is in NAD  Lungs: Clear with good BS bilat with no  rales, rhonchi, or wheezes  Heart: RRR with normal S1 and S2 and no gallops; no murmurs; no rub  Abdomen:  Soft with supra pubic tenderness  Extremities: Trace pretibial edema.  Neuro: MS is good with appropriate affect, pt is alert and Ox3       DM foot exam: 11/2019  The skin of the feet is intact without sores or ulcerations. The pedal pulses are 2+ on right and 2+ on left. The sensation is intact to a screening 5.07, 10 gram monofilament bilaterally    DATA REVIEWED:  Lab Results  Component Value Date   HGBA1C 8.4 (A) 06/12/2020   HGBA1C 8.5 (A) 02/13/2020   HGBA1C 10.7 (A) 10/13/2019   Lab Results  Component Value Date   LDLCALC 54 05/06/2019   CREATININE 2.17 (H) 05/06/2019     Lab Results  Component Value Date   CHOL 114 05/06/2019   HDL 35 (L) 05/06/2019   LDLCALC 54 05/06/2019   TRIG 145 05/06/2019   CHOLHDL 3.3 05/06/2019         ASSESSMENT / PLAN / RECOMMENDATIONS:   1) Type 2 Diabetes Mellitus, Poorly controlled, With CKD III, retinopathy and neuropathic  and macrovascular complications - Most recent A1c of 8.4 %. Goal A1c < 7.5 %.      - A1c improving  - Tolerating Rybelsus without side effects -We have attempted to put her on the V-go but that was cost prohibitive - Has bee noted with fasting hypoglycemia , will reduce basal rate  - We reviewed proper way of hypoglycemic correction as she over corrects   MEDICATIONS: Decreased Toujeo to 36 units once daily - Increase  Humalog 12 units with each  meal - Take Humalog 5 units with snacks -Continue  Rybelsus 7 mg 1 tablet with breakfast -CF: Humalog (BG -130/30)     EDUCATION / INSTRUCTIONS:  BG monitoring instructions: Patient is instructed to check her blood sugars 3 times a day, before meals . I reviewed the Rule of 15 for the treatment of hypoglycemia in detail with the patient. Literature supplied.   2) PVD/Dyslipidemia : Managed by cardiology   F/U in 4 months    Signed electronically by: Mack Guise, MD  [facility]  [facility] [address] [place], [place] [address] Phone: [phone] FAX: [phone]   CC: Leeroy Cha, [address] [place] [address] Phone: [phone]  Fax: [phone]  Return to Endocrinology clinic as below: Future Appointments  Date Time Provider [place]  06/22/2020  9:50 AM [facility] MM 2 [facility]  09/25/2020 11:40 AM Wellington Hampshire, MD [facility]

## 2020-06-22 ENCOUNTER — Other Ambulatory Visit: Payer: Self-pay

## 2020-06-22 ENCOUNTER — Ambulatory Visit
Admission: RE | Admit: 2020-06-22 | Discharge: 2020-06-22 | Disposition: A | Discharge status: Home / Self Care | Payer: Medicare Other | Source: Ambulatory Visit | Attending: Internal Medicine | Admitting: Internal Medicine

## 2020-06-22 DIAGNOSIS — Z1231 Encounter for screening mammogram for malignant neoplasm of breast: Secondary | ICD-10-CM

## 2020-09-18 ENCOUNTER — Other Ambulatory Visit: Payer: Self-pay

## 2020-09-18 ENCOUNTER — Ambulatory Visit (INDEPENDENT_AMBULATORY_CARE_PROVIDER_SITE_OTHER): Payer: Medicare Other | Admitting: Family

## 2020-09-18 ENCOUNTER — Encounter: Payer: Self-pay | Admitting: Family

## 2020-09-18 ENCOUNTER — Ambulatory Visit (HOSPITAL_BASED_OUTPATIENT_CLINIC_OR_DEPARTMENT_OTHER)
Admission: RE | Admit: 2020-09-18 | Discharge: 2020-09-18 | Disposition: A | Discharge status: Home / Self Care | Payer: Medicare Other | Source: Ambulatory Visit | Attending: Family | Admitting: Family

## 2020-09-18 VITALS — BP 160/80 | HR 102 | Temp 98.3°F | Ht 63.0 in | Wt 201.6 lb

## 2020-09-18 DIAGNOSIS — F439 Reaction to severe stress, unspecified: Secondary | ICD-10-CM | POA: Diagnosis not present

## 2020-09-18 DIAGNOSIS — M5441 Lumbago with sciatica, right side: Secondary | ICD-10-CM

## 2020-09-18 DIAGNOSIS — M791 Myalgia, unspecified site: Secondary | ICD-10-CM

## 2020-09-18 DIAGNOSIS — I10 Essential (primary) hypertension: Secondary | ICD-10-CM

## 2020-09-18 DIAGNOSIS — E1122 Type 2 diabetes mellitus with diabetic chronic kidney disease: Secondary | ICD-10-CM

## 2020-09-18 DIAGNOSIS — Z794 Long term (current) use of insulin: Secondary | ICD-10-CM

## 2020-09-18 DIAGNOSIS — N1832 Chronic kidney disease, stage 3b: Secondary | ICD-10-CM

## 2020-09-18 DIAGNOSIS — L659 Nonscarring hair loss, unspecified: Secondary | ICD-10-CM | POA: Diagnosis not present

## 2020-09-18 DIAGNOSIS — N184 Chronic kidney disease, stage 4 (severe): Secondary | ICD-10-CM

## 2020-09-18 LAB — COMPREHENSIVE METABOLIC PANEL
ALT: 13 U/L (ref 0–35)
AST: 13 U/L (ref 0–37)
Albumin: 3.6 g/dL (ref 3.5–5.2)
Alkaline Phosphatase: 105 U/L (ref 39–117)
BUN: 31 mg/dL — ABNORMAL HIGH (ref 6–23)
CO2: 26 mEq/L (ref 19–32)
Calcium: 9.3 mg/dL (ref 8.4–10.5)
Chloride: 106 mEq/L (ref 96–112)
Creatinine, Ser: 1.98 mg/dL — ABNORMAL HIGH (ref 0.40–1.20)
GFR: 25.33 mL/min — ABNORMAL LOW (ref >60.00)
Glucose, Bld: 97 mg/dL (ref 70–99)
Potassium: 4.2 mEq/L (ref 3.5–5.1)
Sodium: 141 mEq/L (ref 135–145)
Total Bilirubin: 0.6 mg/dL (ref 0.2–1.2)
Total Protein: 6.8 g/dL (ref 6.0–8.3)

## 2020-09-18 LAB — CBC WITH DIFFERENTIAL/PLATELET
Basophils Absolute: 0 10*3/uL (ref 0.0–0.1)
Basophils Relative: 0.4 % (ref 0.0–3.0)
Eosinophils Absolute: 0.1 10*3/uL (ref 0.0–0.7)
Eosinophils Relative: 1.1 % (ref 0.0–5.0)
HCT: 34.2 % — ABNORMAL LOW (ref 36.0–46.0)
Hemoglobin: 11.7 g/dL — ABNORMAL LOW (ref 12.0–15.0)
Lymphocytes Relative: 16.7 % (ref 12.0–46.0)
Lymphs Abs: 1.6 10*3/uL (ref 0.7–4.0)
MCHC: 34.1 g/dL (ref 30.0–36.0)
MCV: 84.3 fl (ref 78.0–100.0)
Monocytes Absolute: 0.8 10*3/uL (ref 0.1–1.0)
Monocytes Relative: 8.3 % (ref 3.0–12.0)
Neutro Abs: 6.9 10*3/uL (ref 1.4–7.7)
Neutrophils Relative %: 73.5 % (ref 43.0–77.0)
Platelets: 329 10*3/uL (ref 150.0–400.0)
RBC: 4.05 Mil/uL (ref 3.87–5.11)
RDW: 13.8 % (ref 11.5–15.5)
WBC: 9.4 10*3/uL (ref 4.0–10.5)

## 2020-09-18 LAB — CK: Total CK: 113 U/L (ref 7–177)

## 2020-09-18 LAB — TSH: TSH: 2.63 u[IU]/mL (ref 0.35–5.50)

## 2020-09-18 NOTE — Progress Notes (Signed)
Hayley Case is a 70 y.o. female with the following history as recorded in [employer]:  Patient Active Problem List   Diagnosis Date Noted   Flank pain 10/14/2019   Type 2 diabetes mellitus with diabetic polyneuropathy, with long-term current use of insulin ([place]) 12/01/2018   Type 2 diabetes mellitus with retinopathy, with long-term current use of insulin ([place]) 12/01/2018   Type 2 diabetes mellitus with stage 3b chronic kidney disease, with long-term current use of insulin ([place]) 11/30/2018   Non-intractable vomiting    Acute hepatitis 07/13/2018   Acute on chronic renal insufficiency 07/12/2018   Diabetes mellitus ([place]) 12/08/2016   Essential hypertension 12/08/2016   Hyperlipidemia associated with type 2 diabetes mellitus ([place]) 12/08/2016    Current Outpatient Medications  Medication Sig Dispense Refill   acetaminophen (TYLENOL) 500 MG tablet Take 500 mg by mouth every 4 (four) hours as needed for headache (pain).     albuterol (VENTOLIN HFA) 108 (90 Base) MCG/ACT inhaler Inhale 2 puffs into the lungs every 6 (six) hours as needed for wheezing or shortness of breath. 1 g 3   amLODipine (NORVASC) 10 MG tablet TAKE 1 TABLET BY MOUTH DAILY 90 tablet 3   Carboxymethylcellulose Sodium (REFRESH TEARS OP) Place 1 drop into both eyes at bedtime.      Continuous Blood Gluc Sensor (FREESTYLE LIBRE 2 SENSOR) MISC Inject 1 Device into the skin as directed. Use as directed every 14 days. E11.21 12 each 3   Docusate Sodium (COLACE PO) Take by mouth as needed.     ezetimibe (ZETIA) 10 MG tablet Take 1 tablet (10 mg total) by mouth daily. (Patient taking differently: Take 5 mg by mouth daily.) 90 tablet 1   insulin glargine, 2 Unit Dial, (TOUJEO MAX SOLOSTAR) 300 UNIT/ML Solostar Pen Inject 36 Units into the skin daily. 15 mL 6   insulin lispro (HUMALOG KWIKPEN) 200 UNIT/ML KwikPen Inject 12 Units into the skin with breakfast, with lunch, and with evening meal. Max daily 80 units with correction scale 45  mL 3   Insulin Pen Needle (PEN NEEDLES) 32G X 4 MM MISC 1 Device by Does not apply route in the morning, at noon, in the evening, and at bedtime. 400 each 3   loratadine (CLARITIN) 10 MG tablet Take 10 mg by mouth daily as needed (seasonal allergies).     losartan (COZAAR) 50 MG tablet Take 50 mg by mouth daily.     Misc. Devices MISC Please provide patient with insurance approved blood pressure monitor. 1 each 0   rosuvastatin (CRESTOR) 40 MG tablet Take 1 tablet (40 mg total) by mouth daily. (Patient taking differently: Take 20 mg by mouth daily.) 90 tablet 3   Semaglutide (RYBELSUS) 7 MG TABS Take 7 mg by mouth daily at 2 PM. 90 tablet 3   No current facility-administered medications for this visit.    Allergies: Lisinopril, Lipitor [atorvastatin], Penicillins, and Septra [sulfamethoxazole-trimethoprim]  Past Medical History:  Diagnosis Date   CKD (chronic kidney disease)    Diabetes mellitus without complication ([place])    Hyperlipidemia    Hypertension     Past Surgical History:  Procedure Laterality Date   BREAST BIOPSY     BREAST EXCISIONAL BIOPSY Left     Family History  Problem Relation Age of Onset   Skin cancer Mother    Heart attack Father    Colon cancer Sister     Social History   Tobacco Use   Smoking status: Never  Smokeless tobacco: Never  Substance Use Topics   Alcohol use: No    Subjective:  Right sided low back pain x 1 month- seemed start after recent car trip to [place]; feels pain radiating down from back into lower extremities;  Notes that has to help her son with at home dialysis- 4 x per week/ 5 hours per day; has to take vitals to help with son's dialysis; does not get to sit and rest;   Discussed concerns with former PCP- in the past week, was told to cut the Crestor in 1/2; has noticed on the mornings, she doesn't take Crestor and Zetia, she does feel better;   Of note, patient does have nephrologist with CKD stage 4; sees every 6 months;        Objective:  Vitals:   09/18/20 0859  BP: (!) 160/80  Pulse: (!) 102  Temp: 98.3 F (36.8 C)  TempSrc: Oral  SpO2: 99%  Weight: 201 lb 9.6 oz (91.4 kg)  Height: _0  (1.6 m)    General: Well developed, well nourished, in no acute distress  Skin : Warm and dry.  Head: Normocephalic and atraumatic  Eyes: Sclera and conjunctiva clear; pupils round and reactive to light; extraocular movements intact  Ears: External normal; canals clear; tympanic membranes normal  Oropharynx: Pink, supple. No suspicious lesions  Neck: Supple without thyromegaly, adenopathy  Lungs: Respirations unlabored; clear to auscultation bilaterally without wheeze, rales, rhonchi  CVS exam: normal rate and regular rhythm.  Abdomen: Soft; nontender; nondistended; normoactive bowel sounds; no masses or hepatosplenomegaly  Musculoskeletal: No deformities; no active joint inflammation  Extremities: No edema, cyanosis, clubbing  Vessels: Symmetric bilaterally  Neurologic: Alert and oriented; speech intact; face symmetrical; moves all extremities well; CNII-XII intact without focal deficit  Assessment:  1. Myalgia   2. Situational stress   3. Acute right-sided low back pain with right-sided sciatica   4. Alopecia   5. Type 2 diabetes mellitus with stage 3b chronic kidney disease, with long-term current use of insulin ([facility])   6. Stage 4 chronic kidney disease ([place])   7. Essential hypertension     Plan:  ? Reaction to Crestor; hold for now and re-evaluate in 1 month;  Refer to therapist; Update lumbar X-ray; to consider PT- suspect this is more source of her pain than Crestor;  Refer to dermatology; Continue with endocrinology; Continue with nephrology; Continue with cardiology;  This visit occurred during the SARS-CoV-2 public health emergency.  Safety protocols were in place, including screening questions prior to the visit, additional usage of staff PPE, and extensive cleaning of exam room while  observing appropriate contact time as indicated for disinfecting solutions.    No follow-ups on file.  Orders Placed This Encounter  Procedures   DG Lumbar Spine Complete    Standing Status:   Future    Number of Occurrences:   1    Standing Expiration Date:   09/18/2021    Order Specific Question:   Reason for Exam (SYMPTOM  OR DIAGNOSIS REQUIRED)    Answer:   low back pain with radiculopathy    Order Specific Question:   Preferred imaging location?    Answer:   [facility]   Comp Met (CMET)   CK (Creatine Kinase)   CBC with Differential/Platelet   TSH   Ambulatory referral to Psychology    Referral Priority:   Routine    Referral Type:   Psychiatric    Referral Reason:   Specialty  Services Required    Requested Specialty:   Psychology    Number of Visits Requested:   1   Ambulatory referral to Dermatology    Referral Priority:   Routine    Referral Type:   Consultation    Referral Reason:   Specialty Services Required    Requested Specialty:   Dermatology    Number of Visits Requested:   1    Requested Prescriptions    No prescriptions requested or ordered in this encounter

## 2020-09-18 NOTE — Patient Instructions (Signed)
Please hold the Rosuvastatin (Crestor) as we discussed and let's see how you feel; you will continue the Zetia ( Ezetimibe); Okay to take a whole pill of Zetia;

## 2020-09-20 ENCOUNTER — Other Ambulatory Visit: Payer: Self-pay | Admitting: Family

## 2020-09-20 DIAGNOSIS — G8929 Other chronic pain: Secondary | ICD-10-CM

## 2020-09-25 ENCOUNTER — Ambulatory Visit (INDEPENDENT_AMBULATORY_CARE_PROVIDER_SITE_OTHER): Payer: Medicare Other | Admitting: Cardiovascular Disease

## 2020-09-25 ENCOUNTER — Encounter: Payer: Self-pay | Admitting: Cardiovascular Disease

## 2020-09-25 ENCOUNTER — Other Ambulatory Visit: Payer: Self-pay

## 2020-09-25 VITALS — BP 138/62 | HR 100 | Ht 64.0 in | Wt 203.0 lb

## 2020-09-25 DIAGNOSIS — E785 Hyperlipidemia, unspecified: Secondary | ICD-10-CM

## 2020-09-25 DIAGNOSIS — I10 Essential (primary) hypertension: Secondary | ICD-10-CM | POA: Diagnosis not present

## 2020-09-25 DIAGNOSIS — I739 Peripheral vascular disease, unspecified: Secondary | ICD-10-CM | POA: Diagnosis not present

## 2020-09-25 NOTE — Patient Instructions (Signed)

## 2020-09-25 NOTE — Progress Notes (Signed)
Cardiology Office Note   Date:  09/25/2020   ID:  Hayley Case, DOB 02-Mar-1950, MRN [id number]  PCP:  Marrian Salvage, FNP  Cardiologist: Dr. Audie Box  No chief complaint on file.     History of Present Illness: Hayley Case is a 70 y.o. female who is here today for follow-up visit regarding peripheral arterial disease.   She has known history of diabetes mellitus, hyperlipidemia, hypertension, chronic kidney disease and carotid artery disease. Cardiac testing in 2020 was unremarkable including an echocardiogram and a Lexiscan Myoview.  She is followed for right calf claudication with suspected right SFA/popliteal disease or occlusion.  ABI was normal on the left side and moderately reduced on the right and 0.59.  She did not tolerate cilostazol due to GI symptoms. She is dealing now with severe right leg pain that starts in the thigh area and goes down.  This can happen both at rest or with exertion but does not get worse with activities.  It is most noticeable at night when she is trying to sleep.  It was thought that some of her symptoms might be related to rosuvastatin.  This has been on hold for 1 month and she only had minimal change in symptoms.   Past Medical History:  Diagnosis Date   CKD (chronic kidney disease)    Diabetes mellitus without complication ([place])    Hyperlipidemia    Hypertension     Past Surgical History:  Procedure Laterality Date   BREAST BIOPSY     BREAST EXCISIONAL BIOPSY Left      Current Outpatient Medications  Medication Sig Dispense Refill   acetaminophen (TYLENOL) 500 MG tablet Take 500 mg by mouth every 4 (four) hours as needed for headache (pain).     albuterol (VENTOLIN HFA) 108 (90 Base) MCG/ACT inhaler Inhale 2 puffs into the lungs every 6 (six) hours as needed for wheezing or shortness of breath. 1 g 3   amLODipine (NORVASC) 10 MG tablet TAKE 1 TABLET BY MOUTH DAILY 90 tablet 3   Carboxymethylcellulose Sodium (REFRESH  TEARS OP) Place 1 drop into both eyes at bedtime.      Continuous Blood Gluc Sensor (FREESTYLE LIBRE 2 SENSOR) MISC Inject 1 Device into the skin as directed. Use as directed every 14 days. E11.21 12 each 3   Docusate Sodium (COLACE PO) Take by mouth as needed.     ezetimibe (ZETIA) 10 MG tablet Take 1 tablet (10 mg total) by mouth daily. (Patient taking differently: Take 5 mg by mouth daily.) 90 tablet 1   insulin glargine, 2 Unit Dial, (TOUJEO MAX SOLOSTAR) 300 UNIT/ML Solostar Pen Inject 36 Units into the skin daily. 15 mL 6   insulin lispro (HUMALOG KWIKPEN) 200 UNIT/ML KwikPen Inject 12 Units into the skin with breakfast, with lunch, and with evening meal. Max daily 80 units with correction scale 45 mL 3   Insulin Pen Needle (PEN NEEDLES) 32G X 4 MM MISC 1 Device by Does not apply route in the morning, at noon, in the evening, and at bedtime. 400 each 3   loratadine (CLARITIN) 10 MG tablet Take 10 mg by mouth daily as needed (seasonal allergies).     losartan (COZAAR) 50 MG tablet Take 50 mg by mouth daily.     Misc. Devices MISC Please provide patient with insurance approved blood pressure monitor. 1 each 0   Semaglutide (RYBELSUS) 7 MG TABS Take 7 mg by mouth daily at 2 PM. 90 tablet  3   No current facility-administered medications for this visit.    Allergies:   Lisinopril, Lipitor [atorvastatin], Penicillins, and Septra [sulfamethoxazole-trimethoprim]    Social History:  The patient  reports that she has never smoked. She has never used smokeless tobacco. She reports that she does not drink alcohol and does not use drugs.   Family History:  The patient's family history includes Colon cancer in her sister; Heart attack in her father; Skin cancer in her mother.    ROS:  Please see the history of present illness.   Otherwise, review of systems are positive for none.   All other systems are reviewed and negative.    PHYSICAL EXAM: VS:  BP 138/62 (BP Location: Right Arm, Patient  Position: Sitting, Cuff Size: Normal)   Pulse 100   Ht '5\' 4"'$  (1.626 m)   Wt 203 lb (92.1 kg)   SpO2 98%   BMI 34.84 kg/m  , BMI Body mass index is 34.84 kg/m. GEN: Well nourished, well developed, in no acute distress  HEENT: normal  Neck: no JVD,  or masses.  Right carotid bruit Cardiac: RRR; no murmurs, rubs, or gallops,no edema  Respiratory:  clear to auscultation bilaterally, normal work of breathing GI: soft, nontender, nondistended, + BS MS: no deformity or atrophy  Skin: warm and dry, no rash Neuro:  Strength and sensation are intact Psych: euthymic mood, full affect Vascular: Femoral pulses normal bilaterally.  Distal pulses are palpable on the left side only.   EKG:  EKG is  ordered today. EKG showed normal sinus rhythm with poor R wave progression in the anterior leads.   Recent Labs: 09/18/2020: ALT 13; BUN 31; Creatinine, Ser 1.98; Hemoglobin 11.7; Platelets 329.0; Potassium 4.2; Sodium 141; TSH 2.63    Lipid Panel    Component Value Date/Time   CHOL 114 05/06/2019 1222   TRIG 145 05/06/2019 1222   HDL 35 (L) 05/06/2019 1222   CHOLHDL 3.3 05/06/2019 1222   LDLCALC 54 05/06/2019 1222      Wt Readings from Last 3 Encounters:  09/25/20 203 lb (92.1 kg)  09/18/20 201 lb 9.6 oz (91.4 kg)  06/12/20 199 lb 4 oz (90.4 kg)       No flowsheet data found.    ASSESSMENT AND PLAN:  1.  Peripheral arterial disease with  right calf claudication .  Her current right leg pain that starts up in the hip and thigh area does not seem to be vascular as she has no evidence of inflow disease and right femoral pulse remains normal.  Suspect another etiology.  She is undergoing further work-up but I suspect that she will require an MRI of the LS spine. Right calf claudication seems to be stable.  2.  Essential hypertension: Blood pressures controlled on current medications.  3.  Hyperlipidemia: Rosuvastatin was stopped due to concerns of causing right leg pain but I suspect  that it has no correlation.  She is currently on Zetia.  Consider resuming rosuvastatin at a lower dose.  4.  Chronic kidney disease: Most recent GFR was 26.    Disposition:   FU with me in 6 months  Signed,  Kathlyn Sacramento, MD  09/25/2020 12:57 PM    [place]

## 2020-10-01 IMAGING — CT CT ABDOMEN AND PELVIS WITHOUT CONTRAST
2 of 4 series · 16 of 46 positions shown, 18 images · non-contrast
Comparison: None.

CLINICAL DATA: Lower abdominal pain for 10 days. UTI. Nausea and
vomiting.

EXAM:
CT ABDOMEN AND PELVIS WITHOUT CONTRAST
TECHNIQUE: Multidetector CT imaging of the abdomen and pelvis was performed
following the standard protocol without IV contrast.

[Series 3: a/p w/o 5mm · axial · non-contrast · 0.76mm/px · z∈[+791,+1206]mm · 13 of 93 slices shown, 15 images]
[im 5/93  soft-tissue]
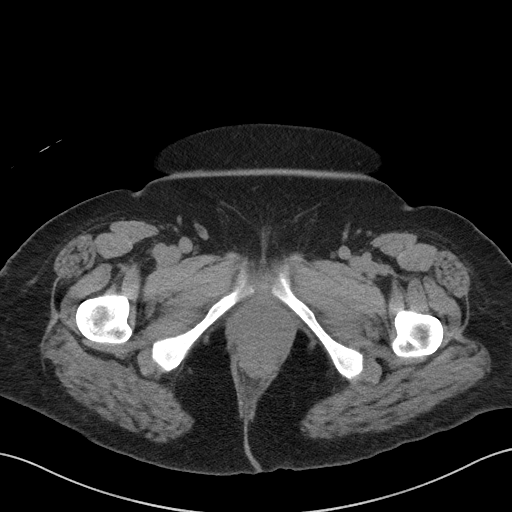
[im 5/93  bone]
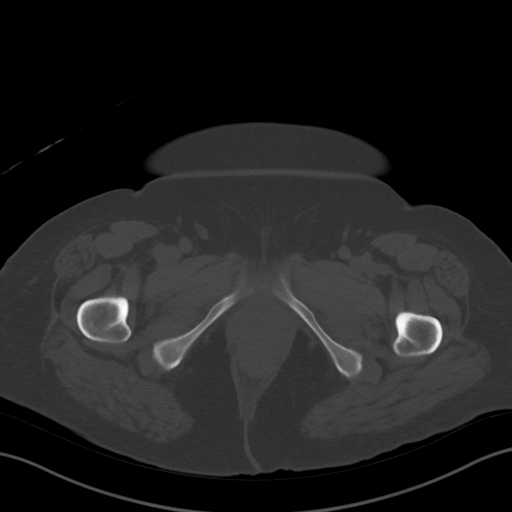
[im 13/93  soft-tissue]
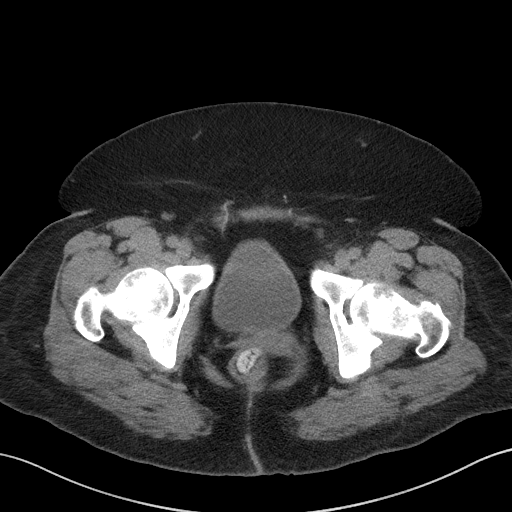
[im 21/93  soft-tissue]
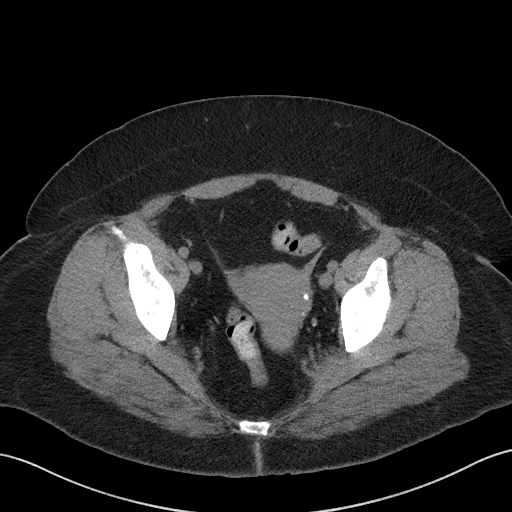
[im 26/93  soft-tissue]
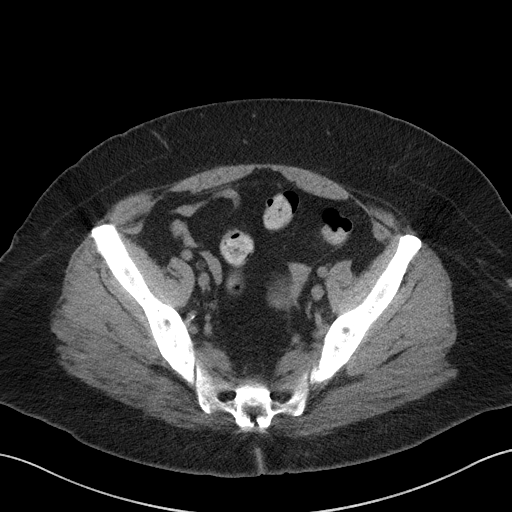
[im 34/93  soft-tissue]
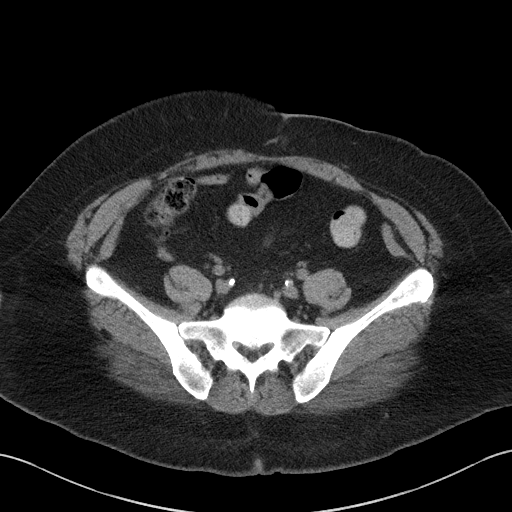
[im 38/93  soft-tissue]
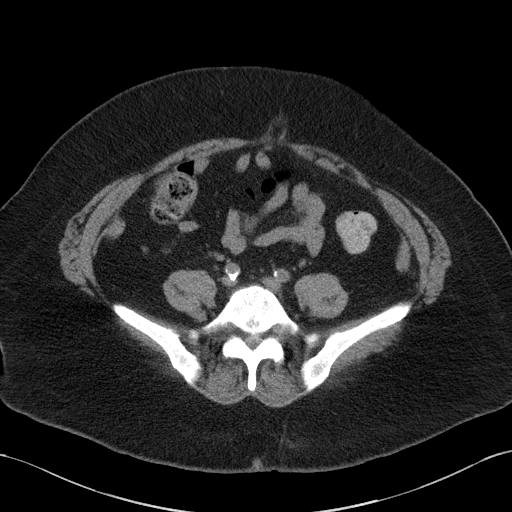
[im 47/93  soft-tissue]
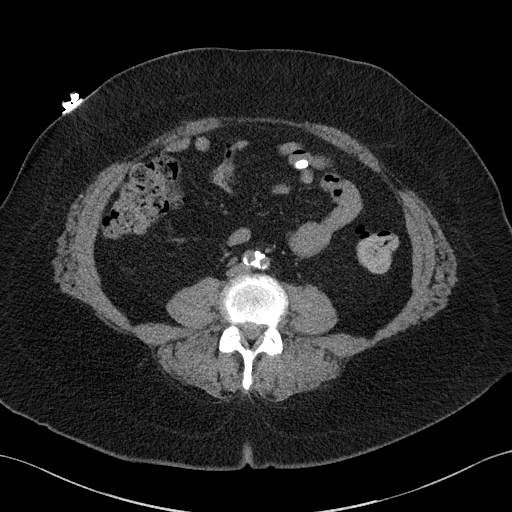
[im 55/93  soft-tissue]
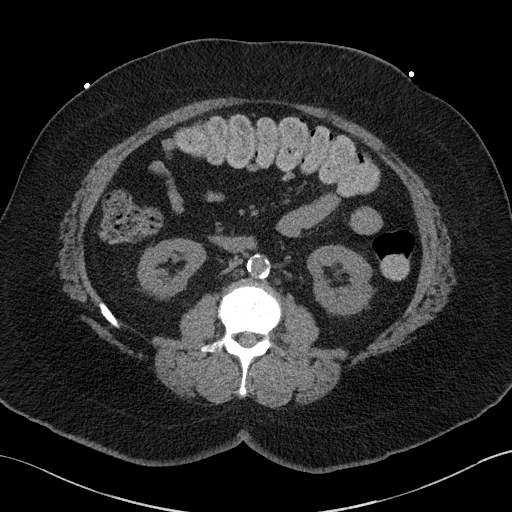
[im 59/93  soft-tissue]
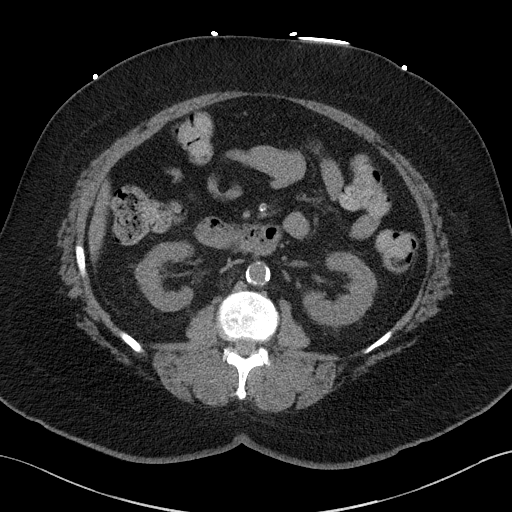
[im 59/93  bone]
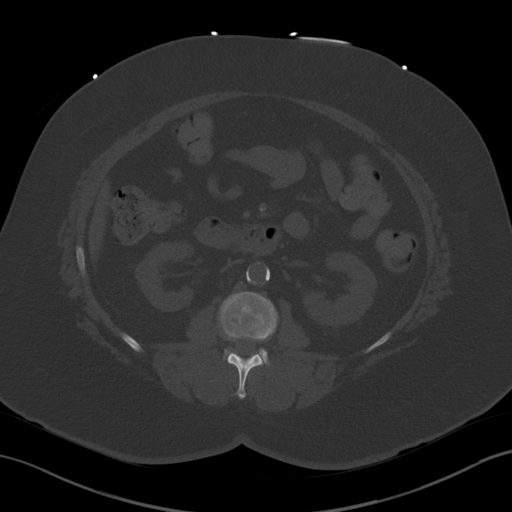
[im 67/93  soft-tissue]
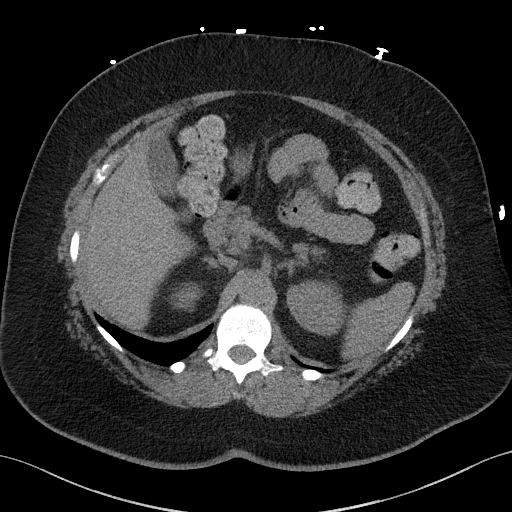
[im 72/93  soft-tissue]
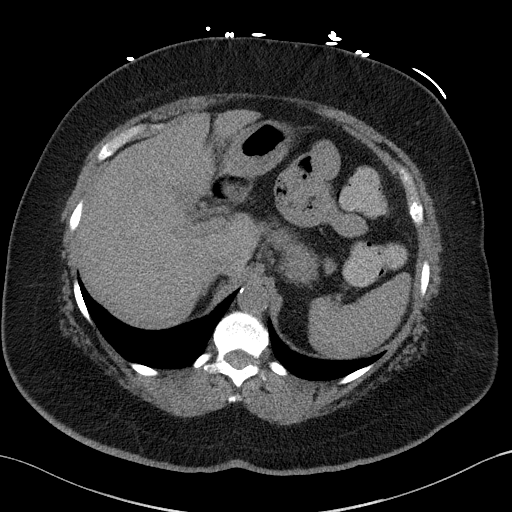
[im 80/93  soft-tissue]
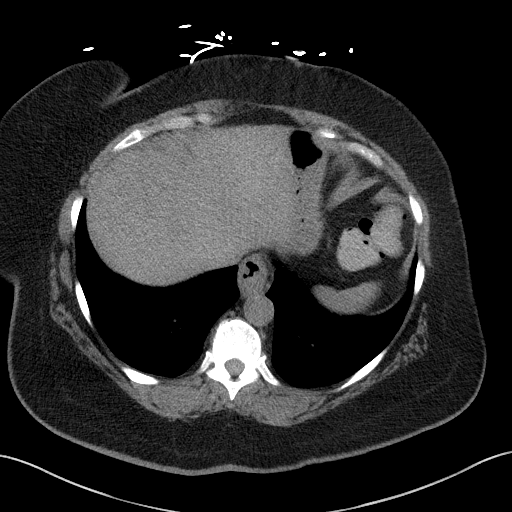
[im 88/93  soft-tissue]
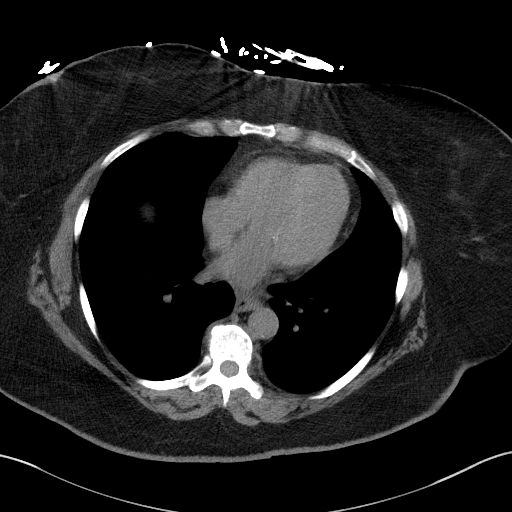

[Series 6: a/p w/o cor · coronal · non-contrast · 0.82mm/px · 3 of 151 slices shown]
[im 51/151  soft-tissue]
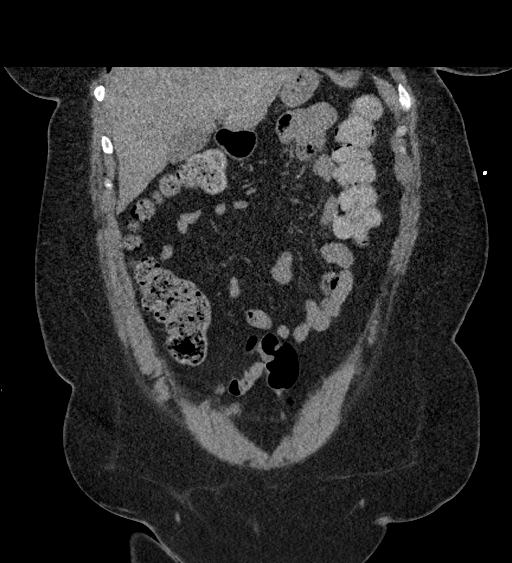
[im 67/151  soft-tissue]
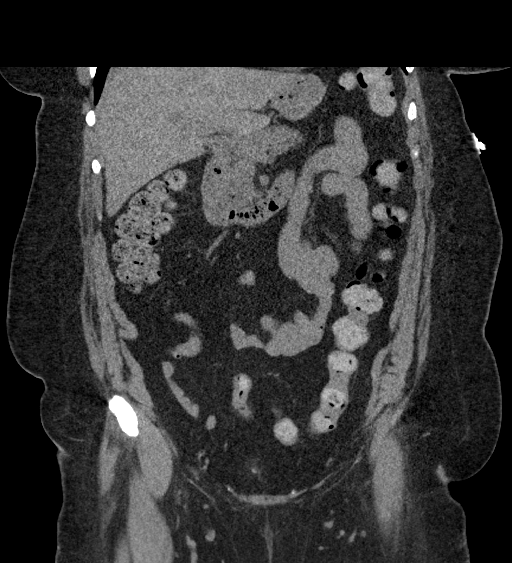
[im 84/151  soft-tissue]
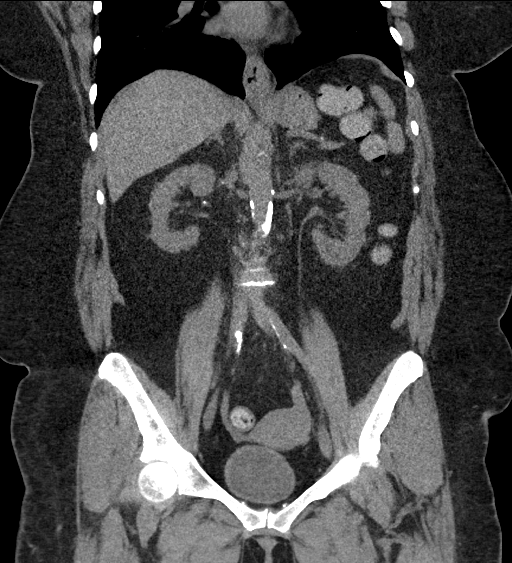

[16 of 46 positions shown; findings below may reference images not displayed]

FINDINGS: Lower chest: The lung bases are clear of acute process. No pleural
effusion or pulmonary lesions. The heart is normal in size. No
pericardial effusion. Aortic and coronary artery calcifications are
noted. The distal esophagus and aorta are unremarkable. Small hiatal
hernia.

Hepatobiliary: No focal hepatic lesions or intrahepatic biliary
dilatation. The gallbladder is unremarkable. No common bile duct
dilatation.

Pancreas: No mass, inflammation or ductal dilatation.

Spleen: Normal size.  No focal lesions.

Adrenals/Urinary Tract: The adrenal glands and kidneys are
unremarkable. No renal, ureteral or bladder calculi or mass is
identified without contrast.

Stomach/Bowel: The stomach, duodenum, small bowel and colon are
grossly normal without oral contrast. No inflammatory changes, mass
lesions or obstructive findings. The terminal ileum and appendix are
normal.

Vascular/Lymphatic: Moderate to advanced atherosclerotic
calcifications for age but no aneurysm. No mesenteric or
retroperitoneal mass or adenopathy.

Reproductive: The uterus and ovaries are unremarkable.

Other: No pelvic mass or adenopathy. No free pelvic fluid
collections. No inguinal mass or adenopathy. No abdominal wall
hernia or subcutaneous lesions.

Musculoskeletal: Musculoskeletal no significant bony findings.
IMPRESSION: 1. No acute abdominal/pelvic findings, mass lesions or adenopathy
without contrast.
2. Age advanced atherosclerotic calcifications involving the aorta
and branch vessels including coronary artery calcifications.
3. No renal, ureteral or bladder calculi or mass.

## 2020-10-10 ENCOUNTER — Other Ambulatory Visit: Payer: Self-pay | Admitting: Cardiovascular Disease

## 2020-10-17 ENCOUNTER — Other Ambulatory Visit: Payer: Self-pay

## 2020-10-17 ENCOUNTER — Ambulatory Visit: Payer: Medicare Other | Attending: Family | Admitting: Physical Therapy

## 2020-10-17 DIAGNOSIS — M6281 Muscle weakness (generalized): Secondary | ICD-10-CM | POA: Insufficient documentation

## 2020-10-17 DIAGNOSIS — G8929 Other chronic pain: Secondary | ICD-10-CM | POA: Diagnosis present

## 2020-10-17 DIAGNOSIS — R252 Cramp and spasm: Secondary | ICD-10-CM | POA: Diagnosis present

## 2020-10-17 DIAGNOSIS — M5441 Lumbago with sciatica, right side: Secondary | ICD-10-CM | POA: Diagnosis not present

## 2020-10-17 NOTE — Therapy (Signed)
[facility] [address]  [place], [place], [address] Phone: [phone]   Fax:  [phone]  Physical Therapy Evaluation  Patient Details  Name: Hayley Case MRN: [id number] Date of Birth: March 07, 1950 Referring Provider (PT): Marrian Salvage, FNP   Encounter Date: 10/17/2020   PT End of Session - 10/17/20 1216     Visit Number 1    Number of Visits 12    Date for PT Re-Evaluation 11/28/20    Authorization Type [employer] Medicare    Progress Note Due on Visit 10    PT Start Time 0930    PT Stop Time 1015    PT Time Calculation (min) 45 min    Activity Tolerance Patient tolerated treatment well    Behavior During Therapy [facility] for tasks assessed/performed             Past Medical History:  Diagnosis Date   CKD (chronic kidney disease)    Diabetes mellitus without complication ([place])    Hyperlipidemia    Hypertension     Past Surgical History:  Procedure Laterality Date   BREAST BIOPSY     BREAST EXCISIONAL BIOPSY Left     There were no vitals filed for this visit.    Subjective Assessment - 10/17/20 0943     Subjective Hayley Case reports history of chronic back pain, but then she drove to [place] 10 hours there and back, which caused a flare up of pain going down her R leg.  She reports the pain is worse in the morning, she can't lay on her right side, and makes transfers difficult, slowing her down.  She helps her son with dialysis at home 4 days/week which takes a lot out of her.  She reports no improvement in symptoms after stopping crestor.  Her blood sugars are up and down, good in morning but she admits she stress eats a lot.    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 tries to not sit long, R leg feels heavy    How long can you stand  comfortably? 5 min    How long can you walk comfortably? 10-15 min    Diagnostic tests lumbar X-ray on 09/18/20   IMPRESSION:  No fracture or dislocation of the lumbar spine. Mild multilevel disc  space height loss and osteophytosis, as well as mild multilevel  facet degenerative change of the lower levels. Lumbar disc and  neural foraminal pathology may be further evaluated by MRI if  indicated by neurologically localizing signs and symptoms.    Patient Stated Goals be more active, be able to sleep on R side without pain, decrease pain    Currently in Pain? Yes    Pain Score 0-No pain   took tylenol prior to coming, pain increases to 7-8/10 at worst   Pain Location Back    Pain Orientation Lower    Pain Descriptors / Indicators Heaviness;Sore   leg feels heavy   Pain Type Acute pain;Chronic pain    Pain Radiating Towards down R side to R knee, sometimes to calf    Pain Onset More than a month ago    Pain Frequency Intermittent    Aggravating Factors  mornings, sitting for too long make getting up difficult    Pain Relieving Factors tylenol,  warm shower    Effect of Pain on Daily Activities makes me slow to do things, can't be very active                [facility] PT Assessment - 10/17/20 0001       Assessment   Medical Diagnosis M54.41,G89.29 (ICD-10-CM) - Chronic right-sided low back pain with right-sided sciatica    Referring Provider (PT) Marrian Salvage, FNP    Onset Date/Surgical Date 09/13/20   acute on chronic   Next MD Visit 10/19/2020    Prior Therapy none      Precautions   Precautions None      Restrictions   Weight Bearing Restrictions No      Balance Screen   Has the patient fallen in the past 6 months No   sometimes feel off balance   Has the patient had a decrease in activity level because of a fear of falling?  No    Is the patient reluctant to leave their home because of a fear of falling?  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    Focus on Therapeutic Outcomes (FOTO)  lumbar 40      Sensation   Light Touch Impaired by gross assessment   diminished over L L3, reports neuropathy in feet     Posture/Postural Control   Posture/Postural Control Postural limitations    Postural Limitations Rounded Shoulders;Forward head;Decreased lumbar lordosis      ROM / Strength   AROM / PROM / Strength Strength;PROM;AROM      AROM   Overall AROM  Within functional limits for tasks performed    Overall AROM Comments standing, WNL but reports increased pain R side with all movements.      PROM   Overall PROM  Within functional limits for tasks performed    Overall PROM Comments supine    PROM Assessment Site Hip    Right/Left Hip Right;Left    Right Hip External Rotation  --   WNL   Right Hip Internal Rotation  --   decreased ~ 5 deg   Left Hip External Rotation  --   WNL   Left Hip Internal Rotation  --   decreased ~ 5 deg     Strength   Overall Strength Within functional limits for tasks performed    Overall Strength Comments tested in sitting    Strength Assessment Site Hip;Knee;Ankle    Right/Left Hip Right;Left    Right Hip Flexion 4/5    Right Hip ABduction 4+/5    Right Hip ADduction 5/5    Left Hip Flexion 4/5    Left Hip ABduction 4+/5    Left Hip ADduction 5/5    Right/Left Knee Right;Left    Right Knee Flexion 4+/5    Right Knee Extension 5/5    Left Knee Flexion 4+/5    Left Knee Extension  5/5    Right/Left Ankle Right;Left     Right Ankle Dorsiflexion 4+/5    Left Ankle Dorsiflexion 4+/5      Flexibility   Soft Tissue Assessment /Muscle Length yes    Hamstrings --   tightness bil HS with SLR to 60 deg limited by tightness   ITB tight R side      Palpation   Spinal mobility good mobility, slightly tender L3-4    SI assessment  tenderness R SIJ    Palpation comment tender throughout R lumbar paraspinals, piriformis, R SIJ, R glutes      Special Tests   Other special tests Positive supine to long sit test, negative retest after MET      Transfers   Five time sit to stand comments  27 seconds, hands on thighs, limited by L knee OA      Ambulation/Gait   Ambulation/Gait Yes    Assistive device None                        Objective measurements completed on examination: See above findings.       [facility] Adult PT Treatment/Exercise - 10/17/20 0001       Manual Therapy   Manual Therapy Muscle Energy Technique;Soft tissue mobilization    Manual therapy comments to correct pelvic alignment and decrease pain.    Soft tissue mobilization STM to R SIJ and piriformis.    Muscle Energy Technique resisted R hip extension 5 x 6 sec hold, 2 rounds, to correct pelvic rotation.                     PT Education - 10/17/20 1215     Education Details education on findings, POC and possible modalities.  Recommended trying pillow under knees if sleep on back or between knees if on side to help with pain at night.    Person(s) Educated Patient    Methods Explanation    Comprehension Verbalized understanding                 PT Long Term Goals - 10/17/20 1318       PT LONG TERM GOAL #1   Title Pt. will be independent with progressed HEP to improve outcomes.    Time 6    Period Weeks    Status New    Target Date 11/28/20      PT LONG TERM GOAL #2   Title Pt. will demonstrate improved functional strength and mobility by improving 5x STS to <20 seconds    Baseline 27 seconds     Time 6    Period Weeks    Status New    Target Date 11/28/20      PT LONG TERM GOAL #3   Title Pt. will report 75% improvement in sleep disruption due to pain.    Baseline cannot lay on R side, increased pain 7-8/10 in AM    Time 6    Period Weeks    Status New    Target Date 11/28/20      PT LONG TERM GOAL #4   Title Patient will report improved tolerance to standing/walking to 15 minutes without increased LBP    Time 6    Period Weeks    Status New    Target Date 11/28/20                    Plan - 10/17/20 1217  Clinical Impression Statement Hayley Case is a 70 year old female referred for acute on chronic LBP with R sided sciatica.  Examination is consistent with referral.  Hayley Case demonstrates low back pain, noted today supine to long sit test indicating adaptive shortening of lumbopelvic musculature, addressed with MET.  She demonstrates difficulty with transitions, decreased ROM and decreased tolerance to activities, including standing and walking.  She would benefit from skilled physical therapy to decrease pain and improve mobility.    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, stage 4 CKD, HTN    Examination-Activity Limitations Bed Mobility;Dressing;Transfers;Sleep;Lift;Caring for Others;Locomotion Level;Stand    Examination-Participation Restrictions Cleaning;Laundry;Community Activity;Driving;Shop;Other    Stability/Clinical Decision Making Evolving/Moderate complexity    Clinical Decision Making Moderate    Rehab Potential Good    PT Frequency 2x / week    PT Duration 6 weeks    PT Treatment/Interventions ADLs/Self Care Home Management;Cryotherapy;Electrical Stimulation;Moist Heat;Traction;Ultrasound;Iontophoresis 68m/ml Dexamethasone;Gait training;Stair training;Functional mobility training;Therapeutic activities;Therapeutic exercise;Balance  training;Neuromuscular re-education;Patient/family education;Manual techniques;Passive range of motion;Dry needling;Taping;Spinal Manipulations;Joint Manipulations    PT Next Visit Plan initiate HEP for core strengthening, manual therapy, modalities PRN.  Give sleep posture education sheet.  consider DN to R piriformis/glut med and lumbar paraspinals.    Consulted and Agree with Plan of Care Patient             Patient will benefit from skilled therapeutic intervention in order to improve the following deficits and impairments:  Decreased activity tolerance, Decreased endurance, Decreased range of motion, Decreased strength, Increased fascial restricitons, Improper body mechanics, Pain, Decreased balance, Decreased mobility, Difficulty walking, Increased muscle spasms, Impaired flexibility, Postural dysfunction  Visit Diagnosis: Chronic midline low back pain with right-sided sciatica  Cramp and spasm  Muscle weakness (generalized)     Problem List Patient Active Problem List   Diagnosis Date Noted   Flank pain 10/14/2019   Type 2 diabetes mellitus with diabetic polyneuropathy, with long-term current use of insulin ([place] 12/01/2018   Type 2 diabetes mellitus with retinopathy, with long-term current use of insulin ([place] 12/01/2018   Type 2 diabetes mellitus with stage 3b chronic kidney disease, with long-term current use of insulin ([place] 11/30/2018   Non-intractable vomiting    Acute hepatitis 07/13/2018   Acute on chronic renal insufficiency 07/12/2018   Diabetes mellitus ([place] 12/08/2016   Essential hypertension 12/08/2016   Hyperlipidemia associated with type 2 diabetes mellitus ([place] 12/08/2016    ERennie Natter PT, DPT 10/17/2020, 1:34 PM  [facility] [address] [place] [address]Phone: [phone]  Fax:  [phone] Name: Hayley DELPRIOREMRN: [id number] of Birth: [birth year]

## 2020-10-19 ENCOUNTER — Encounter: Payer: Self-pay | Admitting: Family

## 2020-10-19 ENCOUNTER — Ambulatory Visit (INDEPENDENT_AMBULATORY_CARE_PROVIDER_SITE_OTHER): Payer: Medicare Other | Admitting: Family

## 2020-10-19 ENCOUNTER — Other Ambulatory Visit: Payer: Self-pay

## 2020-10-19 VITALS — BP 140/70 | HR 97 | Temp 98.0°F | Ht 64.0 in | Wt 207.2 lb

## 2020-10-19 DIAGNOSIS — E785 Hyperlipidemia, unspecified: Secondary | ICD-10-CM | POA: Diagnosis not present

## 2020-10-19 DIAGNOSIS — M5441 Lumbago with sciatica, right side: Secondary | ICD-10-CM

## 2020-10-19 DIAGNOSIS — E1169 Type 2 diabetes mellitus with other specified complication: Secondary | ICD-10-CM | POA: Diagnosis not present

## 2020-10-19 DIAGNOSIS — Z23 Encounter for immunization: Secondary | ICD-10-CM

## 2020-10-19 MED ORDER — ROSUVASTATIN CALCIUM 20 MG PO TABS: 20.0000 mg | ORAL_TABLET | Freq: Every day | ORAL | 3 refills | Status: DC

## 2020-10-19 NOTE — Progress Notes (Signed)
Hayley Case is a 70 y.o. female with the following history as recorded in EpicCare:  Patient Active Problem List   Diagnosis Date Noted   Flank pain 10/14/2019   Type 2 diabetes mellitus with diabetic polyneuropathy, with long-term current use of insulin ([place]) 12/01/2018   Type 2 diabetes mellitus with retinopathy, with long-term current use of insulin ([place]) 12/01/2018   Type 2 diabetes mellitus with stage 3b chronic kidney disease, with long-term current use of insulin ([place]) 11/30/2018   Non-intractable vomiting    Acute hepatitis 07/13/2018   Acute on chronic renal insufficiency 07/12/2018   Diabetes mellitus ([place]) 12/08/2016   Essential hypertension 12/08/2016   Hyperlipidemia associated with type 2 diabetes mellitus ([place]) 12/08/2016    Current Outpatient Medications  Medication Sig Dispense Refill   acetaminophen (TYLENOL) 500 MG tablet Take 500 mg by mouth every 4 (four) hours as needed for headache (pain). '625mg'$      albuterol (VENTOLIN HFA) 108 (90 Base) MCG/ACT inhaler Inhale 2 puffs into the lungs every 6 (six) hours as needed for wheezing or shortness of breath. 1 g 3   amLODipine (NORVASC) 10 MG tablet TAKE 1 TABLET BY MOUTH DAILY 90 tablet 3   Carboxymethylcellulose Sodium (REFRESH TEARS OP) Place 1 drop into both eyes at bedtime.      cholecalciferol (VITAMIN D3) 25 MCG (1000 UNIT) tablet Take 1,000 Units by mouth daily. Takes 2 tablets so 2,000     Continuous Blood Gluc Sensor (FREESTYLE LIBRE 2 SENSOR) MISC Inject 1 Device into the skin as directed. Use as directed every 14 days. E11.21 12 each 3   Docusate Sodium (COLACE PO) Take by mouth as needed.     ezetimibe (ZETIA) 10 MG tablet Take 0.5 tablets (5 mg total) by mouth daily. 90 tablet 1   insulin glargine, 2 Unit Dial, (TOUJEO MAX SOLOSTAR) 300 UNIT/ML Solostar Pen Inject 36 Units into the skin daily. 15 mL 6   insulin lispro (HUMALOG KWIKPEN) 200 UNIT/ML KwikPen Inject 12 Units into the skin with breakfast, with  lunch, and with evening meal. Max daily 80 units with correction scale 45 mL 3   Insulin Pen Needle (PEN NEEDLES) 32G X 4 MM MISC 1 Device by Does not apply route in the morning, at noon, in the evening, and at bedtime. 400 each 3   loratadine (CLARITIN) 10 MG tablet Take 10 mg by mouth daily as needed (seasonal allergies).     losartan (COZAAR) 50 MG tablet Take 50 mg by mouth daily.     Misc. Devices MISC Please provide patient with insurance approved blood pressure monitor. 1 each 0   rosuvastatin (CRESTOR) 20 MG tablet Take 1 tablet (20 mg total) by mouth daily. 90 tablet 3   Semaglutide (RYBELSUS) 7 MG TABS Take 7 mg by mouth daily at 2 PM. 90 tablet 3   vitamin B-12 (CYANOCOBALAMIN) 1000 MCG tablet Take 1,000 mcg by mouth daily.     No current facility-administered medications for this visit.    Allergies: Lisinopril, Lipitor [atorvastatin], Penicillins, and Septra [sulfamethoxazole-trimethoprim]  Past Medical History:  Diagnosis Date   CKD (chronic kidney disease)    Diabetes mellitus without complication ([place])    Hyperlipidemia    Hypertension     Past Surgical History:  Procedure Laterality Date   BREAST BIOPSY     BREAST EXCISIONAL BIOPSY Left     Family History  Problem Relation Age of Onset   Skin cancer Mother    Heart attack Father  Colon cancer Sister     Social History   Tobacco Use   Smoking status: Never   Smokeless tobacco: Never  Substance Use Topics   Alcohol use: No    Subjective:   1 month follow up on myalgias/ low back pain/ hyperlipidemia; Has started PT and is optimistic about response; does think she is feeling some better since stopping Crestor 40 mg; her vascular provider has also discussed with her and recommending trial of lower dose statin;  Due to see her endocrinologist in December 2022;     Objective:  Vitals:   10/19/20 0935  BP: 140/70  Pulse: 97  Temp: 98 F (36.7 C)  TempSrc: Oral  SpO2: 98%  Weight: 207 lb 3.2 oz (94  kg)  Height: '5\' 4"'$  (1.626 m)    General: Well developed, well nourished, in no acute distress  Skin : Warm and dry.  Head: Normocephalic and atraumatic  Eyes: Sclera and conjunctiva clear; pupils round and reactive to light; extraocular movements intact  Ears: External normal; canals clear; tympanic membranes normal  Oropharynx: Pink, supple. No suspicious lesions  Neck: Supple without thyromegaly, adenopathy  Lungs: Respirations unlabored; clear to auscultation bilaterally without wheeze, rales, rhonchi  CVS exam: normal rate and regular rhythm.  Neurologic: Alert and oriented; speech intact; face symmetrical; moves all extremities well; CNII-XII intact without focal deficit   Assessment:  1. Right-sided low back pain with right-sided sciatica, unspecified chronicity   2. Hyperlipidemia associated with type 2 diabetes mellitus ([place])   3. Need for influenza vaccination     Plan:  Update lumbar MRI; continue PT; will need to consider ortho evaluation; Trial of Crestor 20 mg daily; continue Zetia 10 mg; keep planned follow up with cardiology, vascular specialist; Flu shot given;   This visit occurred during the SARS-CoV-2 public health emergency.  Safety protocols were in place, including screening questions prior to the visit, additional usage of staff PPE, and extensive cleaning of exam room while observing appropriate contact time as indicated for disinfecting solutions.    No follow-ups on file.  Orders Placed This Encounter  Procedures   MR Lumbar Spine Wo Contrast    Standing Status:   Future    Standing Expiration Date:   10/19/2021    Order Specific Question:   What is the patient's sedation requirement?    Answer:   No Sedation    Order Specific Question:   Does the patient have a pacemaker or implanted devices?    Answer:   No    Order Specific Question:   Preferred imaging location?    Answer:   [facility] (table limit-550lbs)   Flu Vaccine QUAD High Dose(Fluad)     Requested Prescriptions   Signed Prescriptions Disp Refills   rosuvastatin (CRESTOR) 20 MG tablet 90 tablet 3    Sig: Take 1 tablet (20 mg total) by mouth daily.

## 2020-10-22 ENCOUNTER — Other Ambulatory Visit: Payer: Self-pay

## 2020-10-22 ENCOUNTER — Ambulatory Visit: Payer: Medicare Other | Admitting: Physical Therapy

## 2020-10-22 DIAGNOSIS — M5441 Lumbago with sciatica, right side: Secondary | ICD-10-CM | POA: Diagnosis not present

## 2020-10-22 DIAGNOSIS — M6281 Muscle weakness (generalized): Secondary | ICD-10-CM

## 2020-10-22 DIAGNOSIS — G8929 Other chronic pain: Secondary | ICD-10-CM

## 2020-10-22 DIAGNOSIS — R252 Cramp and spasm: Secondary | ICD-10-CM

## 2020-10-22 NOTE — Therapy (Signed)
[facility] [address]  [place], [place], [address] Phone: [phone]   Fax:  [phone]  Physical Therapy Treatment  Patient Details  Name: Hayley Case MRN: [id number] Date of Birth: [birth year] Referring Provider (PT): Marrian Salvage, FNP   Encounter Date: 10/22/2020   PT End of Session - 10/22/20 0936     Visit Number 2    Number of Visits 12    Date for PT Re-Evaluation 11/28/20    Authorization Type UHC Medicare    Progress Note Due on Visit 10    PT Start Time 0932    PT Stop Time 1015    PT Time Calculation (min) 43 min    Activity Tolerance Patient tolerated treatment well    Behavior During Therapy [facility] for tasks assessed/performed             Past Medical History:  Diagnosis Date   CKD (chronic kidney disease)    Diabetes mellitus without complication ([place])    Hyperlipidemia    Hypertension     Past Surgical History:  Procedure Laterality Date   BREAST BIOPSY     BREAST EXCISIONAL BIOPSY Left     There were no vitals filed for this visit.   Subjective Assessment - 10/22/20 0935     Subjective Pt. reports she had a severe muscle cramp in R thigh day after initial eval.  Back hurt this morning but improved after a warm shower.    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 tries to not sit long, R leg feels heavy    How long can you stand comfortably? 5 min    How long can you walk comfortably? 10-15 min    Diagnostic tests lumbar X-ray on 09/18/20   IMPRESSION:  No fracture or dislocation of the lumbar spine. Mild multilevel disc  space height loss and osteophytosis, as well as mild multilevel  facet degenerative change of the lower levels. Lumbar disc and  neural foraminal pathology may be further evaluated by MRI if   indicated by neurologically localizing signs and symptoms.    Patient Stated Goals be more active, be able to sleep on R side without pain, decrease pain    Currently in Pain? No/denies    Pain Onset More than a month ago                               [facility] Adult PT Treatment/Exercise - 10/22/20 0001       Exercises   Exercises Lumbar      Lumbar Exercises: Stretches   Active Hamstring Stretch Right;Left;Other (comment)    Active Hamstring Stretch Limitations knee extensions x 10 followed by 2 x 30 sec stretch with strap    Lower Trunk Rotation Other (comment)    Lower Trunk Rotation Limitations x 10 bil    Pelvic Tilt 10 reps    Pelvic Tilt Limitations cues to not hold breathe      Lumbar Exercises: Aerobic   Recumbent Bike L5 x 6 min      Lumbar Exercises: Seated   Other Seated Lumbar Exercises demo'd sitting HS stretch      Lumbar Exercises: Sidelying   Clam 20 reps    Clam  Limitations cues for technique, 2 x 10 bil      Lumbar Exercises: Prone   Other Prone Lumbar Exercises prone knee bends 2 x 10      Manual Therapy   Manual Therapy Joint mobilization;Soft tissue mobilization    Manual therapy comments supine to decrease LBP and improve vertebral mobility    Joint Mobilization R UPA mobs to L1-L4 grade 2-3    Soft tissue mobilization IASTM with foam roller to R HS, glutes and lumbar paraspinals, STM to lumbar paraspinals,                     PT Education - 10/22/20 1022     Education Details Access Code: [email or web address]              PT Short Term Goals - 10/22/20 1033       PT SHORT TERM GOAL #1   Title Pt will be independent with initial HEP for core strengthening    Time 2    Period Weeks    Status On-going   given initial 10/10   Target Date 10/29/20               PT Long Term Goals - 10/22/20 1033       PT LONG TERM GOAL #1   Title Pt. will be independent with progressed HEP to improve outcomes.    Time 6     Period Weeks    Status New      PT LONG TERM GOAL #2   Title Pt. will demonstrate improved functional strength and mobility by improving 5x STS to <20 seconds    Baseline 27 seconds    Time 6    Period Weeks    Status New      PT LONG TERM GOAL #3   Title Pt. will report 75% improvement in sleep disruption due to pain.    Baseline cannot lay on R side, increased pain 7-8/10 in AM    Time 6    Period Weeks    Status New      PT LONG TERM GOAL #4   Title Patient will report improved tolerance to standing/walking to 15 minutes without increased LBP    Time 6    Period Weeks    Status New                   Plan - 10/22/20 1024     Clinical Impression Statement Today focused on initiating HEP for core strengthening focusing on neutral spine exercises.  She tolerated these well without complaint of pain or cramping.  End of session performed manual therapy, noted significant tenderness in R lumbar and lower thoracic paraspinals.  She would benefit from continued skilled therapy.    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, stage 4 CKD, HTN    Examination-Activity Limitations Bed Mobility;Dressing;Transfers;Sleep;Lift;Caring for Others;Locomotion Level;Stand    Examination-Participation Restrictions Cleaning;Laundry;Community Activity;Driving;Shop;Other    Stability/Clinical Decision Making Evolving/Moderate complexity    Rehab Potential Good    PT Frequency 2x / week    PT Duration 6 weeks    PT Treatment/Interventions ADLs/Self Care Home Management;Cryotherapy;Electrical Stimulation;Moist Heat;Traction;Ultrasound;Iontophoresis '4mg'$ /ml Dexamethasone;Gait training;Stair training;Functional mobility training;Therapeutic activities;Therapeutic exercise;Balance training;Neuromuscular re-education;Patient/family education;Manual techniques;Passive range of motion;Dry  needling;Taping;Spinal Manipulations;Joint Manipulations    PT Next Visit Plan review HEP for core strengthening, manual therapy, modalities PRN.  Consider DN to R piriformis/glut med  and lumbar paraspinals.    Consulted and Agree with Plan of Care Patient             Patient will benefit from skilled therapeutic intervention in order to improve the following deficits and impairments:  Decreased activity tolerance, Decreased endurance, Decreased range of motion, Decreased strength, Increased fascial restricitons, Improper body mechanics, Pain, Decreased balance, Decreased mobility, Difficulty walking, Increased muscle spasms, Impaired flexibility, Postural dysfunction  Visit Diagnosis: Chronic midline low back pain with right-sided sciatica  Cramp and spasm  Muscle weakness (generalized)     Problem List Patient Active Problem List   Diagnosis Date Noted   Flank pain 10/14/2019   Type 2 diabetes mellitus with diabetic polyneuropathy, with long-term current use of insulin ([place]) 12/01/2018   Type 2 diabetes mellitus with retinopathy, with long-term current use of insulin ([place]) 12/01/2018   Type 2 diabetes mellitus with stage 3b chronic kidney disease, with long-term current use of insulin ([place]) 11/30/2018   Non-intractable vomiting    Acute hepatitis 07/13/2018   Acute on chronic renal insufficiency 07/12/2018   Diabetes mellitus ([place]) 12/08/2016   Essential hypertension 12/08/2016   Hyperlipidemia associated with type 2 diabetes mellitus ([place]) 12/08/2016    Rennie Natter, PT, DPT 10/22/2020, 10:44 AM  [facility] [address]  [place], [place], [address] Phone: [phone]   Fax:  [phone]  Name: Hayley Case MRN: [id number] Date of Birth: 1950-05-28

## 2020-10-22 NOTE — Patient Instructions (Signed)
Access Code: [email or web address] URL: [email or web address] Date: 10/22/2020 Prepared by: Glenetta Hew  Exercises Supine Lower Trunk Rotation - 1 x daily - 7 x weekly - 2 sets - 10 reps Supine Posterior Pelvic Tilt - 1 x daily - 7 x weekly - 2 sets - 10 reps - 5 sec hold Hooklying Hamstring Stretch with Strap - 1 x daily - 7 x weekly - 1 sets - 3 reps - 30 secc hold Seated Hamstring Stretch - 1 x daily - 7 x weekly - 1 sets - 3 reps - 30 sec hold Clamshell - 1 x daily - 7 x weekly - 2 sets - 10 reps Prone Knee Flexion - 1 x daily - 7 x weekly - 2 sets - 10 reps  Patient Education Sleep Positions

## 2020-10-25 ENCOUNTER — Ambulatory Visit: Payer: Medicare Other

## 2020-10-25 ENCOUNTER — Other Ambulatory Visit: Payer: Self-pay

## 2020-10-25 DIAGNOSIS — R252 Cramp and spasm: Secondary | ICD-10-CM

## 2020-10-25 DIAGNOSIS — G8929 Other chronic pain: Secondary | ICD-10-CM

## 2020-10-25 DIAGNOSIS — M6281 Muscle weakness (generalized): Secondary | ICD-10-CM

## 2020-10-25 DIAGNOSIS — M5441 Lumbago with sciatica, right side: Secondary | ICD-10-CM | POA: Diagnosis not present

## 2020-10-25 NOTE — Therapy (Signed)
[facility] [address]  [place], [place], [address] Phone: [phone]   Fax:  [phone]  Physical Therapy Treatment  Patient Details  Name: Hayley Case MRN: [id number] Date of Birth: [birth year] Referring Provider (PT): Marrian Salvage, FNP   Encounter Date: 10/25/2020   PT End of Session - 10/25/20 1016     Visit Number 3    Number of Visits 12    Date for PT Re-Evaluation 11/28/20    Authorization Type UHC Medicare    Progress Note Due on Visit 10    PT Start Time 0932    PT Stop Time 1013    PT Time Calculation (min) 41 min    Activity Tolerance Patient tolerated treatment well    Behavior During Therapy [facility] for tasks assessed/performed             Past Medical History:  Diagnosis Date   CKD (chronic kidney disease)    Diabetes mellitus without complication ([place])    Hyperlipidemia    Hypertension     Past Surgical History:  Procedure Laterality Date   BREAST BIOPSY     BREAST EXCISIONAL BIOPSY Left     There were no vitals filed for this visit.   Subjective Assessment - 10/25/20 0936     Subjective Pt notes R knee pain and not much pain in the back today.    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 Mild multilevel disc  space height loss and osteophytosis, as well as mild multilevel  facet degenerative change of the lower levels. Lumbar disc and  neural foraminal pathology may be further evaluated by MRI if  indicated by neurologically localizing signs and symptoms.    Patient Stated Goals be more active, be able to sleep on R side without pain, decrease pain    Currently in Pain? Yes    Pain Score 5     Pain Location Knee    Pain Orientation Right;Anterior    Pain Descriptors / Indicators  Heaviness;Sore    Pain Type Acute pain;Chronic pain                               [facility] Adult PT Treatment/Exercise - 10/25/20 0001       Exercises   Exercises Lumbar      Lumbar Exercises: Stretches   Lower Trunk Rotation Limitations 10x5"    Pelvic Tilt 10 reps;5 seconds    Pelvic Tilt Limitations PPT    Piriformis Stretch Right;2 reps;30 seconds;Left    Piriformis Stretch Limitations supine    Figure 4 Stretch 2 reps;30 seconds;Supine;With overpressure    Figure 4 Stretch Limitations R/L      Lumbar Exercises: Aerobic   Recumbent Bike L1x74mn      Lumbar Exercises: Supine   Bridge 10 reps      Lumbar Exercises: Sidelying   Clam Right;Left;10 reps    Clam Limitations red TB                       PT Short Term Goals - 10/22/20 1033       PT SHORT TERM GOAL #1   Title Pt will be  independent with initial HEP for core strengthening    Time 2    Period Weeks    Status On-going   given initial 10/10   Target Date 10/29/20               PT Long Term Goals - 10/22/20 1033       PT LONG TERM GOAL #1   Title Pt. will be independent with progressed HEP to improve outcomes.    Time 6    Period Weeks    Status New      PT LONG TERM GOAL #2   Title Pt. will demonstrate improved functional strength and mobility by improving 5x STS to <20 seconds    Baseline 27 seconds    Time 6    Period Weeks    Status New      PT LONG TERM GOAL #3   Title Pt. will report 75% improvement in sleep disruption due to pain.    Baseline cannot lay on R side, increased pain 7-8/10 in AM    Time 6    Period Weeks    Status New      PT LONG TERM GOAL #4   Title Patient will report improved tolerance to standing/walking to 15 minutes without increased LBP    Time 6    Period Weeks    Status New                   Plan - 10/25/20 1017     Clinical Impression Statement Pt reported 5/10 R knee pain before the start of the sesison. She  toleated the exercises well today. Progressed clamshells with red TB and given for at home. Instructions given intermittently with exercises to isolate the correct muscles. She did show some mod pririformis tightness bilaterally. Pt responded well.    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  Consider DN to R piriformis/glut med and lumbar paraspinals; progress core strengthening    Consulted and Agree with Plan of Care Patient             Patient will benefit from skilled therapeutic intervention in order to improve the following deficits and impairments:  Decreased activity tolerance, Decreased endurance, Decreased range of motion, Decreased strength, Increased fascial restricitons, Improper body mechanics, Pain, Decreased balance, Decreased mobility, Difficulty walking, Increased muscle spasms, Impaired flexibility, Postural dysfunction  Visit Diagnosis: Chronic midline low back pain with right-sided sciatica  Cramp and spasm  Muscle weakness (generalized)     Problem List Patient Active Problem List   Diagnosis Date Noted   Flank pain 10/14/2019   Type 2 diabetes mellitus with diabetic polyneuropathy, with long-term current use of insulin ([place]) 12/01/2018   Type 2 diabetes mellitus with retinopathy, with  long-term current use of insulin ([place]) 12/01/2018   Type 2 diabetes mellitus with stage 3b chronic kidney disease, with long-term current use of insulin ([place]) 11/30/2018   Non-intractable vomiting    Acute hepatitis 07/13/2018   Acute on chronic renal insufficiency 07/12/2018   Diabetes mellitus ([place])  12/08/2016   Essential hypertension 12/08/2016   Hyperlipidemia associated with type 2 diabetes mellitus ([place]) 12/08/2016    Artist Pais, PTA 10/25/2020, 10:53 AM  [facility] [address]  [place], [place], [address] Phone: [phone]   Fax:  [phone]  Name: REYLYNN CAMPOPIANO MRN: [id number] Date of Birth: [birth year]

## 2020-10-29 ENCOUNTER — Encounter: Payer: Self-pay | Admitting: Physical Therapy

## 2020-10-29 ENCOUNTER — Other Ambulatory Visit: Payer: Self-pay

## 2020-10-29 ENCOUNTER — Ambulatory Visit: Payer: Medicare Other | Admitting: Physical Therapy

## 2020-10-29 DIAGNOSIS — M6281 Muscle weakness (generalized): Secondary | ICD-10-CM

## 2020-10-29 DIAGNOSIS — R252 Cramp and spasm: Secondary | ICD-10-CM

## 2020-10-29 DIAGNOSIS — M5441 Lumbago with sciatica, right side: Secondary | ICD-10-CM | POA: Diagnosis not present

## 2020-10-29 DIAGNOSIS — G8929 Other chronic pain: Secondary | ICD-10-CM

## 2020-10-29 NOTE — Therapy (Signed)
[facility] [address]  [place], [place], [address] Phone: [phone]   Fax:  [phone]  Physical Therapy Treatment  Patient Details  Name: Hayley Case MRN: [id number] Date of Birth: Sep 17, 1950 Referring Provider (PT): Marrian Salvage, FNP   Encounter Date: 10/29/2020   PT End of Session - 10/29/20 0933     Visit Number 4    Number of Visits 12    Date for PT Re-Evaluation 11/28/20    Authorization Type UHC Medicare    Progress Note Due on Visit 10    PT Start Time 0929    PT Stop Time 1014    PT Time Calculation (min) 45 min    Activity Tolerance Patient tolerated treatment well    Behavior During Therapy [facility] for tasks assessed/performed             Past Medical History:  Diagnosis Date   CKD (chronic kidney disease)    Diabetes mellitus without complication ([place])    Hyperlipidemia    Hypertension     Past Surgical History:  Procedure Laterality Date   BREAST BIOPSY     BREAST EXCISIONAL BIOPSY Left     There were no vitals filed for this visit.   Subjective Assessment - 10/29/20 0930     Subjective Patient reports her pain is off and on.  She does do a few exercises in morning before she gets up.  Has MRI scheduled for the 25th.    Diagnostic tests lumbar X-ray on 09/18/20   IMPRESSION:  No fracture or dislocation of the lumbar spine. Mild multilevel disc  space height loss and osteophytosis, as well as mild multilevel  facet degenerative change of the lower levels. Lumbar disc and  neural foraminal pathology may be further evaluated by MRI if  indicated by neurologically localizing signs and symptoms.    Patient Stated Goals be more active, be able to sleep on R side without pain, decrease pain    Currently in Pain? Yes    Pain Score 1     Pain Location Hip    Pain Orientation Right                               [facility] Adult PT Treatment/Exercise - 10/29/20  0001       Exercises   Exercises Lumbar      Lumbar Exercises: Stretches   Piriformis Stretch Right;2 reps;30 seconds;Left    Piriformis Stretch Limitations supine      Lumbar Exercises: Aerobic   Recumbent Bike L1x30mn      Lumbar Exercises: Standing   Other Standing Lumbar Exercises standing side glides (r side to wall) x 15, standing extension at wall x 15, no pain      Lumbar Exercises: Supine   Pelvic Tilt 20 reps;5 seconds    Clam 20 reps;Limitations    Clam Limitations RTB, PPT    Bridge 20 reps    Bridge Limitations with PPT to initiate    Bridge with March 10 reps    Bridge with BCardinal HealthLimitations PPT not bridge      Lumbar Exercises: Sidelying   Clam Right;Left;10 reps    Clam Limitations red TB      Manual Therapy   Manual Therapy Joint mobilization;Soft tissue mobilization    Manual therapy comments supine to decrease LBP and improve vertebral mobility    Joint  Mobilization R UPA mobs to L1-L4 grade 2-3    Soft tissue mobilization IASTM with foam roller to R HS, glutes and lumbar paraspinals, STM to lumbar paraspinals and R piriformis                     PT Education - 10/29/20 1024     Education Details HEP update    Person(s) Educated Patient    Methods Explanation;Demonstration;Verbal cues;Handout    Comprehension Verbalized understanding;Returned demonstration              PT Short Term Goals - 10/29/20 1025       PT SHORT TERM GOAL #1   Title Pt will be independent with initial HEP for core strengthening    Time 2    Period Weeks    Status Achieved   10/29/20- performs daily   Target Date 10/29/20               PT Long Term Goals - 10/29/20 1025       PT LONG TERM GOAL #1   Title Pt. will be independent with progressed HEP to improve outcomes.    Time 6    Period Weeks    Status On-going   10/17- progressed   Target Date 11/28/20      PT LONG TERM GOAL #2   Title Pt. will demonstrate improved functional  strength and mobility by improving 5x STS to <20 seconds    Baseline 27 seconds    Time 6    Period Weeks    Status On-going    Target Date 11/28/20      PT LONG TERM GOAL #3   Title Pt. will report 75% improvement in sleep disruption due to pain.    Baseline cannot lay on R side, increased pain 7-8/10 in AM    Time 6    Period Weeks    Status On-going   10/17- progressing   Target Date 11/28/20      PT LONG TERM GOAL #4   Title Patient will report improved tolerance to standing/walking to 15 minutes without increased LBP    Time 6    Period Weeks    Status On-going    Target Date 11/28/20                   Plan - 10/29/20 1022     Clinical Impression Statement Patient reports improvements overall, with minimal R hip pain today, "even with the rain."  Progressed HEP to include standing mckenzie extension exercises followed by continued core strengthening, all of which she tolerated well without increased pain.  Manual therapy to low back, she did report some tenderness in R L3/4 region still.  She would benefit from continued skilled therapy.    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  Consider DN to R piriformis/glut med and lumbar paraspinals; progress core strengthening    Consulted and Agree  with Plan of Care Patient  Patient will benefit from skilled therapeutic intervention in order to improve the following deficits and impairments:  Decreased activity tolerance, Decreased endurance, Decreased range of motion, Decreased strength, Increased fascial restricitons, Improper body mechanics, Pain, Decreased balance, Decreased mobility, Difficulty walking, Increased muscle spasms, Impaired flexibility, Postural dysfunction  Visit Diagnosis: Chronic midline low back pain with right-sided sciatica  Cramp and spasm  Muscle weakness (generalized)     Problem List Patient Active Problem List   Diagnosis Date Noted   Flank pain 10/14/2019   Type 2 diabetes mellitus with diabetic polyneuropathy, with long-term current use of insulin ([place]) 12/01/2018   Type 2 diabetes mellitus with retinopathy, with long-term current use of insulin ([place]) 12/01/2018   Type 2 diabetes mellitus with stage 3b chronic kidney disease, with long-term current use of insulin ([place]) 11/30/2018   Non-intractable vomiting    Acute hepatitis 07/13/2018   Acute on chronic renal insufficiency 07/12/2018   Diabetes mellitus ([place]) 12/08/2016   Essential hypertension 12/08/2016   Hyperlipidemia associated with type 2 diabetes mellitus ([place]) 12/08/2016    Rennie Natter, PT, DPT 10/29/2020, 12:09 PM  [facility] [address]  [place], [place], [address] Phone: [phone]   Fax:  [phone]  Name: Hayley Case MRN: [id number] Date of Birth: 10-Jul-1950

## 2020-10-29 NOTE — Patient Instructions (Signed)
Access Code: [facility] URL: [email or web address] Date: 10/29/2020 Prepared by: Glenetta Hew  Exercises Right Standing Lateral Shift Correction at [place] - 3 x daily - 7 x weekly - 1 sets - 15 reps Standing Lumbar Extension at [place] - 3 x daily - 7 x weekly - 1 sets - 15 reps

## 2020-11-01 ENCOUNTER — Encounter: Payer: Self-pay | Admitting: Physical Therapy

## 2020-11-01 ENCOUNTER — Other Ambulatory Visit: Payer: Self-pay

## 2020-11-01 ENCOUNTER — Ambulatory Visit: Payer: Medicare Other | Admitting: Physical Therapy

## 2020-11-01 DIAGNOSIS — G8929 Other chronic pain: Secondary | ICD-10-CM

## 2020-11-01 DIAGNOSIS — R252 Cramp and spasm: Secondary | ICD-10-CM

## 2020-11-01 DIAGNOSIS — M5441 Lumbago with sciatica, right side: Secondary | ICD-10-CM

## 2020-11-01 DIAGNOSIS — M6281 Muscle weakness (generalized): Secondary | ICD-10-CM

## 2020-11-01 NOTE — Patient Instructions (Signed)
Access Code: [email or web address] URL: [email or web address] Date: 11/01/2020 Prepared by: Glenetta Hew  Exercises Seated Sciatic Tensioner - 1 x daily - 7 x weekly - 3 sets - 10 reps Long Sitting Calf Stretch with Strap - 1 x daily - 7 x weekly - 1 sets - 3 reps - 15 sec hold Standing Gastroc Stretch at Counter - 1 x daily - 7 x weekly - 1 sets - 3 reps - 15 sec hold Standing Hip Extension with Counter Support - 1 x daily - 7 x weekly - 2 sets - 10 reps Seated Pelvic Tilt - 1 x daily - 7 x weekly - 2 sets - 10 reps

## 2020-11-01 NOTE — Therapy (Signed)
[facility] [address]  [place], [place], [address] Phone: [phone]   Fax:  [phone]  Physical Therapy Treatment  Patient Details  Name: Hayley Case MRN: [id number] Date of Birth: July 11, 1950 Referring Provider (PT): Marrian Salvage, FNP   Encounter Date: 11/01/2020   PT End of Session - 11/01/20 1038     Visit Number 5    Number of Visits 12    Date for PT Re-Evaluation 11/28/20    Authorization Type [employer] Medicare    Progress Note Due on Visit 10    PT Start Time 0935    PT Stop Time 1020    PT Time Calculation (min) 45 min    Activity Tolerance Patient tolerated treatment well    Behavior During Therapy [facility] for tasks assessed/performed             Past Medical History:  Diagnosis Date   CKD (chronic kidney disease)    Diabetes mellitus without complication ([place])    Hyperlipidemia    Hypertension     Past Surgical History:  Procedure Laterality Date   BREAST BIOPSY     BREAST EXCISIONAL BIOPSY Left     There were no vitals filed for this visit.   Subjective Assessment - 11/01/20 0941     Subjective Patient reports very little pain today, but has a lot of muscle cramps in legs at night.    Diagnostic tests lumbar X-ray on 09/18/20   IMPRESSION:  No fracture or dislocation of the lumbar spine. Mild multilevel disc  space height loss and osteophytosis, as well as mild multilevel  facet degenerative change of the lower levels. Lumbar disc and  neural foraminal pathology may be further evaluated by MRI if  indicated by neurologically localizing signs and symptoms.    Patient Stated Goals be more active, be able to sleep on R side without pain, decrease pain    Currently in Pain? Yes    Pain Score 2     Pain Location Hip    Pain Orientation Right                               [facility] Adult PT Treatment/Exercise - 11/01/20 0001       Exercises   Exercises Lumbar       Lumbar Exercises: Stretches   Gastroc Stretch Right;Left;2 reps;20 seconds    Gastroc Stretch Limitations both seated with towel and standing with chair for support      Lumbar Exercises: Aerobic   Other Aerobic Exercise walking x 450'      Lumbar Exercises: Standing   Row Strengthening;Both;20 reps;Theraband    Theraband Level (Row) Level 2 (Red)    Row Limitations cues for core engagement and breathing    Shoulder Extension Strengthening;Both;20 reps;Theraband    Theraband Level (Shoulder Extension) Level 2 (Red)    Shoulder Extension Limitations cues for core engagement and breathing    Other Standing Lumbar Exercises standing side glides (r side to wall) x 10, standing extension at wall x 10, no pain    Other Standing Lumbar Exercises hip extensions at counter x 10 bil      Lumbar Exercises: Seated   Long Arc Quad on Chair 10 reps    LAQ on Chair Limitations for nerve glide RLE    Hip Flexion on Ball AROM;10 reps    Hip Flexion on Ball Limitations  foward flexion to decrease LBP    Other Seated Lumbar Exercises seated pelvic tilts x 10, repeated in standing x 10      Manual Therapy   Manual Therapy Joint mobilization;Soft tissue mobilization    Manual therapy comments prone to decrease LBP and improve vertebral mobility    Joint Mobilization R UPA mobs to L1-L4 grade 2-3    Soft tissue mobilization IASTM with foam roller to R HS, glutes and lumbar paraspinals, STM to R QL                     PT Education - 11/01/20 1038     Education Details HEP update    Person(s) Educated Patient    Methods Explanation;Demonstration;Verbal cues;Handout    Comprehension Verbalized understanding;Returned demonstration              PT Short Term Goals - 10/29/20 1025       PT SHORT TERM GOAL #1   Title Pt will be independent with initial HEP for core strengthening    Time 2    Period Weeks    Status Achieved   10/29/20- performs daily   Target Date 10/29/20                PT Long Term Goals - 11/01/20 1041       PT LONG TERM GOAL #1   Title Pt. will be independent with progressed HEP to improve outcomes.    Time 6    Period Weeks    Status On-going   10/17- progressed     PT LONG TERM GOAL #2   Title Pt. will demonstrate improved functional strength and mobility by improving 5x STS to <20 seconds    Baseline 27 seconds    Time 6    Period Weeks    Status On-going      PT LONG TERM GOAL #3   Title Pt. will report 75% improvement in sleep disruption due to pain.    Baseline cannot lay on R side, increased pain 7-8/10 in AM    Time 6    Period Weeks    Status On-going   10/17- progressing     PT LONG TERM GOAL #4   Title Patient will report improved tolerance to standing/walking to 15 minutes without increased LBP    Time 6    Period Weeks    Status On-going   10/20- 450' ( ~4 min) walking before R radicular symptoms increased                  Plan - 11/01/20 1039     Clinical Impression Statement Patient reports minimal pain, however with warm up walked 450' before needing to stop due to R radicular symptoms.  Reports able to walk farther in stores when pushing buggy.  Focused today's session on sciatic nerve glides for radicular symptoms, stretches to address complaint of muscle cramping and starting standing exercises for lumbar strengthening.  Noted tenderness in R QL today with manual therapy.  She would benefit from continued skilled therapy.    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  Consider DN to R piriformis/glut med and lumbar paraspinals; progress core strengthening    Consulted and Agree with Plan of Care Patient             Patient will benefit from skilled therapeutic intervention in order to improve the following deficits and impairments:  Decreased activity tolerance, Decreased endurance, Decreased range of motion, Decreased strength, Increased fascial restricitons, Improper body mechanics, Pain, Decreased balance, Decreased mobility, Difficulty walking, Increased muscle spasms, Impaired flexibility, Postural dysfunction  Visit Diagnosis: Chronic midline low back pain with right-sided sciatica  Cramp and spasm  Muscle weakness (generalized)     Problem List Patient Active Problem List   Diagnosis Date Noted   Flank pain 10/14/2019   Type 2 diabetes mellitus with diabetic polyneuropathy, with long-term current use of insulin ([place]) 12/01/2018   Type 2 diabetes mellitus with retinopathy, with long-term current use of insulin ([place]) 12/01/2018   Type 2 diabetes mellitus with stage 3b chronic kidney disease, with long-term current use of insulin ([place]) 11/30/2018   Non-intractable vomiting    Acute hepatitis 07/13/2018   Acute on chronic renal insufficiency 07/12/2018   Diabetes mellitus ([place]) 12/08/2016   Essential hypertension 12/08/2016   Hyperlipidemia associated with type 2 diabetes mellitus ([place]) 12/08/2016    Rennie Natter, PT, DPT 11/01/2020, 10:51 AM  [facility] [address]  [place], [place], [address] Phone: [phone]   Fax:  [phone]  Name: Hayley Case MRN: [id number] Date of Birth: 04-26-1950

## 2020-11-05 ENCOUNTER — Ambulatory Visit (INDEPENDENT_AMBULATORY_CARE_PROVIDER_SITE_OTHER): Payer: Medicare Other | Admitting: Psychology

## 2020-11-05 DIAGNOSIS — F4323 Adjustment disorder with mixed anxiety and depressed mood: Secondary | ICD-10-CM

## 2020-11-06 ENCOUNTER — Ambulatory Visit: Payer: Medicare Other

## 2020-11-06 ENCOUNTER — Other Ambulatory Visit: Payer: Self-pay

## 2020-11-06 DIAGNOSIS — G8929 Other chronic pain: Secondary | ICD-10-CM

## 2020-11-06 DIAGNOSIS — M5441 Lumbago with sciatica, right side: Secondary | ICD-10-CM | POA: Diagnosis not present

## 2020-11-06 DIAGNOSIS — R252 Cramp and spasm: Secondary | ICD-10-CM

## 2020-11-06 DIAGNOSIS — M6281 Muscle weakness (generalized): Secondary | ICD-10-CM

## 2020-11-06 NOTE — Therapy (Signed)
[facility] [address]  [place], [place], [address] Phone: [phone]   Fax:  [phone]  Physical Therapy Treatment  Patient Details  Name: Hayley Case MRN: [id number] Date of Birth: 1950/01/15 Referring Provider (PT): Marrian Salvage, FNP   Encounter Date: 11/06/2020   PT End of Session - 11/06/20 1015     Visit Number 6    Number of Visits 12    Date for PT Re-Evaluation 11/28/20    Authorization Type UHC Medicare    Progress Note Due on Visit 10    PT Start Time 0930    PT Stop Time 1012    PT Time Calculation (min) 42 min    Activity Tolerance Patient tolerated treatment well    Behavior During Therapy [facility] for tasks assessed/performed             Past Medical History:  Diagnosis Date   CKD (chronic kidney disease)    Diabetes mellitus without complication ([place])    Hyperlipidemia    Hypertension     Past Surgical History:  Procedure Laterality Date   BREAST BIOPSY     BREAST EXCISIONAL BIOPSY Left     There were no vitals filed for this visit.   Subjective Assessment - 11/06/20 0932     Subjective Pt reports that she is scheduled for an MRI later on today for her back. Pt notes some continued R knee pain.    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 Mild multilevel disc  space height loss and osteophytosis, as well as mild multilevel  facet degenerative change of the lower levels. Lumbar disc and  neural foraminal pathology may be further evaluated by MRI if  indicated by neurologically localizing signs and symptoms.    Patient Stated Goals be more active, be able to sleep on R side without pain, decrease pain    Currently in Pain? Yes    Pain Score 4     Pain Location Knee    Pain Orientation  Right    Pain Descriptors / Indicators Sharp;Aching    Pain Type Chronic pain                               [facility] Adult PT Treatment/Exercise - 11/06/20 0001       Exercises   Exercises Lumbar      Lumbar Exercises: Stretches   Other Lumbar Stretch Exercise fwd flexion rollout with green pball 15x5"      Lumbar Exercises: Aerobic   Nustep L4x21min      Lumbar Exercises: Standing   Other Standing Lumbar Exercises bird dog at counter 10    Other Standing Lumbar Exercises hip extension with counter support 10x; cues not to lean fwd      Lumbar Exercises: Seated   Sit to Stand 20 reps    Sit to Stand Limitations 2x10; cues needed to increase fwd WS to decrease pressure on the R knee      Lumbar Exercises: Supine   Bridge with [employer] 20 reps    Bridge with [employer] Limitations PPT    Straight Leg Raise 20 reps    Straight Leg Raises Limitations 2x10  Knee/Hip Exercises: Standing   Heel Raises Both;2 sets;10 reps    Hip Flexion Stengthening;Both;2 sets;10 reps (marches)   Other Standing Knee Exercises standing hs curls with UE assist 2x10                       PT Short Term Goals - 10/29/20 1025       PT SHORT TERM GOAL #1   Title Pt will be independent with initial HEP for core strengthening    Time 2    Period Weeks    Status Achieved   10/29/20- performs daily   Target Date 10/29/20               PT Long Term Goals - 11/01/20 1041       PT LONG TERM GOAL #1   Title Pt. will be independent with progressed HEP to improve outcomes.    Time 6    Period Weeks    Status On-going   10/17- progressed     PT LONG TERM GOAL #2   Title Pt. will demonstrate improved functional strength and mobility by improving 5x STS to <20 seconds    Baseline 27 seconds    Time 6    Period Weeks    Status On-going      PT LONG TERM GOAL #3   Title Pt. will report 75% improvement in sleep disruption due to pain.    Baseline  cannot lay on R side, increased pain 7-8/10 in AM    Time 6    Period Weeks    Status On-going   10/17- progressing     PT LONG TERM GOAL #4   Title Patient will report improved tolerance to standing/walking to 15 minutes without increased LBP    Time 6    Period Weeks    Status On-going   10/20- 450' ( ~4 min) walking before R radicular symptoms increased                  Plan - 11/06/20 1016     Clinical Impression Statement Pt reported only R knee pain today. Focused on general strengthening to improve B LE stability and support for the low back. She noted that is having an MRI for her back to rule out any other diagnoses. Cues required with standing hip exercises to prevent compensation with her trunk and for upright posture. She had no complaints with the exercises today.    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  Consider DN to R piriformis/glut med and lumbar paraspinals; progress core strengthening    Consulted and Agree with Plan of Care Patient             Patient will benefit from skilled therapeutic intervention in order to improve the following deficits and impairments:  Decreased activity tolerance, Decreased endurance, Decreased range of motion, Decreased strength, Increased fascial  restricitons, Improper body mechanics, Pain, Decreased balance, Decreased mobility, Difficulty walking, Increased muscle spasms, Impaired flexibility, Postural dysfunction  Visit Diagnosis: Chronic midline low  back pain with right-sided sciatica  Cramp and spasm  Muscle weakness (generalized)     Problem List Patient Active Problem List   Diagnosis Date Noted   Flank pain 10/14/2019   Type 2 diabetes mellitus with diabetic polyneuropathy, with long-term current use of insulin ([place]) 12/01/2018   Type 2 diabetes mellitus with retinopathy, with long-term current use of insulin ([place]) 12/01/2018   Type 2 diabetes mellitus with stage 3b chronic kidney disease, with long-term current use of insulin ([place]) 11/30/2018   Non-intractable vomiting    Acute hepatitis 07/13/2018   Acute on chronic renal insufficiency 07/12/2018   Diabetes mellitus ([place]) 12/08/2016   Essential hypertension 12/08/2016   Hyperlipidemia associated with type 2 diabetes mellitus ([place]) 12/08/2016    Artist Pais, PTA 11/06/2020, 10:36 AM  [facility] [address]  [place], [place], [address] Phone: [phone]   Fax:  [phone]  Name: Hayley Case MRN: [id number] Date of Birth: 03-May-1950

## 2020-11-08 ENCOUNTER — Ambulatory Visit: Payer: Medicare Other | Admitting: Physical Therapy

## 2020-11-08 ENCOUNTER — Other Ambulatory Visit: Payer: Self-pay

## 2020-11-08 ENCOUNTER — Encounter: Payer: Self-pay | Admitting: Physical Therapy

## 2020-11-08 DIAGNOSIS — M6281 Muscle weakness (generalized): Secondary | ICD-10-CM

## 2020-11-08 DIAGNOSIS — G8929 Other chronic pain: Secondary | ICD-10-CM

## 2020-11-08 DIAGNOSIS — M5441 Lumbago with sciatica, right side: Secondary | ICD-10-CM | POA: Diagnosis not present

## 2020-11-08 DIAGNOSIS — R252 Cramp and spasm: Secondary | ICD-10-CM

## 2020-11-08 NOTE — Patient Instructions (Signed)
Access Code: [email or web address] URL: [email or web address] Date: 11/08/2020 Prepared by: Glenetta Hew  Patient Education Lifting Techniques

## 2020-11-08 NOTE — Therapy (Signed)
[facility] [address]  [place], [place], [address] Phone: [phone]   Fax:  [phone]  Physical Therapy Treatment  Patient Details  Name: Hayley Case MRN: [id number] Date of Birth: [birth year] Referring Provider (PT): Marrian Salvage, FNP   Encounter Date: 11/08/2020   PT End of Session - 11/08/20 0938     Visit Number 7    Number of Visits 12    Date for PT Re-Evaluation 11/28/20    Authorization Type UHC Medicare    Progress Note Due on Visit 10    PT Start Time 0931    PT Stop Time 1016    PT Time Calculation (min) 45 min    Activity Tolerance Patient tolerated treatment well    Behavior During Therapy [facility] for tasks assessed/performed             Past Medical History:  Diagnosis Date   CKD (chronic kidney disease)    Diabetes mellitus without complication ([place])    Hyperlipidemia    Hypertension     Past Surgical History:  Procedure Laterality Date   BREAST BIOPSY     BREAST EXCISIONAL BIOPSY Left     There were no vitals filed for this visit.   Subjective Assessment - 11/08/20 0934     Subjective Patient reports that MRI machine broke so her appt. was rescheduled to Monday.  She reports they lowered the dose of her statin to 20mg  and that seems to be helping.    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 Mild multilevel disc  space height loss and osteophytosis, as well as mild multilevel  facet degenerative change of the lower levels. Lumbar disc and  neural foraminal pathology may be further evaluated by MRI if  indicated by neurologically localizing signs and symptoms.    Patient Stated Goals be more active, be able to sleep on R side without pain, decrease pain    Currently in Pain? No/denies                                [facility] Adult PT Treatment/Exercise - 11/08/20 0001       Therapeutic Activites    Therapeutic Activities Lifting    Lifting education on safety with lifting, lifting 12# wooden box from floor to counter, education on golfer's lift, return demo picking up objects with support.      Exercises   Exercises Lumbar      Lumbar Exercises: Aerobic   Nustep L5x56min      Lumbar Exercises: Standing   Other Standing Lumbar Exercises foward T's 2 x 10 with hand on counter    Other Standing Lumbar Exercises hip extensions 2 x 10 at counter      Lumbar Exercises: Seated   Sit to Stand Limitations 2x10; IASTM between to R quad to decrease discomfort/tightness, improved second set      Manual Therapy   Manual Therapy Soft tissue mobilization    Manual therapy comments R knee in sitting to decrease pain with sit to stands    Soft tissue mobilization IASTM to R quad  PT Education - 11/08/20 1203     Education Details education on safety, ADLs and lifting techniques    Person(s) Educated Patient    Methods Explanation;Demonstration;Handout    Comprehension Verbalized understanding;Returned demonstration              PT Short Term Goals - 10/29/20 1025       PT SHORT TERM GOAL #1   Title Pt will be independent with initial HEP for core strengthening    Time 2    Period Weeks    Status Achieved   10/29/20- performs daily   Target Date 10/29/20               PT Long Term Goals - 11/01/20 1041       PT LONG TERM GOAL #1   Title Pt. will be independent with progressed HEP to improve outcomes.    Time 6    Period Weeks    Status On-going   10/17- progressed     PT LONG TERM GOAL #2   Title Pt. will demonstrate improved functional strength and mobility by improving 5x STS to <20 seconds    Baseline 27 seconds    Time 6    Period Weeks    Status On-going      PT LONG TERM GOAL #3   Title  Pt. will report 75% improvement in sleep disruption due to pain.    Baseline cannot lay on R side, increased pain 7-8/10 in AM    Time 6    Period Weeks    Status On-going   10/17- progressing     PT LONG TERM GOAL #4   Title Patient will report improved tolerance to standing/walking to 15 minutes without increased LBP    Time 6    Period Weeks    Status On-going   10/20- 450' ( ~4 min) walking before R radicular symptoms increased                  Plan - 11/08/20 0939     Clinical Impression Statement Patient is doing well, reporting no back pain otday, but still has MRI scheduled for Monday.  Focus of skilled interventions today was on safety with ADLs and lifting to avoid back strain, since she has to lift boxes of supplies for son's dialysis as they are delivered.  Also discussed options for aquatic therapy following PT to continue to maintain strength and flexibility, especially as she reports the warm water in shower improves her ability to exercise.  Also cautioned not to perform any exercises in the shower due to safety concerns.  Pt. would benefit from continued skilled therapy.    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  Consider DN to R piriformis/glut med and lumbar paraspinals; progress core  strengthening    Consulted and Agree with Plan of Care Patient             Patient will benefit from skilled therapeutic intervention in order to improve the following deficits and impairments:  Decreased activity tolerance,  Decreased endurance, Decreased range of motion, Decreased strength, Increased fascial restricitons, Improper body mechanics, Pain, Decreased balance, Decreased mobility, Difficulty walking, Increased muscle spasms, Impaired flexibility, Postural dysfunction  Visit Diagnosis: Chronic midline low back pain with right-sided sciatica  Cramp and spasm  Muscle weakness (generalized)     Problem List Patient Active Problem List   Diagnosis Date Noted   Flank pain 10/14/2019   Type 2 diabetes mellitus with diabetic polyneuropathy, with long-term current use of insulin ([place]) 12/01/2018   Type 2 diabetes mellitus with retinopathy, with long-term current use of insulin ([place]) 12/01/2018   Type 2 diabetes mellitus with stage 3b chronic kidney disease, with long-term current use of insulin ([place]) 11/30/2018   Non-intractable vomiting    Acute hepatitis 07/13/2018   Acute on chronic renal insufficiency 07/12/2018   Diabetes mellitus ([place]) 12/08/2016   Essential hypertension 12/08/2016   Hyperlipidemia associated with type 2 diabetes mellitus ([place]) 12/08/2016    Rennie Natter, PT, DPT 11/08/2020, 12:09 PM  [facility] [address]  [place], [place], [address] Phone: [phone]   Fax:  [phone]  Name: ERIELLE GAWRONSKI MRN: [id number] Date of Birth: 08-29-1950

## 2020-11-12 ENCOUNTER — Other Ambulatory Visit: Payer: Self-pay

## 2020-11-12 ENCOUNTER — Ambulatory Visit
Admission: RE | Admit: 2020-11-12 | Discharge: 2020-11-12 | Disposition: A | Discharge status: Home / Self Care | Payer: Medicare Other | Source: Ambulatory Visit | Attending: Family | Admitting: Family

## 2020-11-12 DIAGNOSIS — M5441 Lumbago with sciatica, right side: Secondary | ICD-10-CM

## 2020-11-14 ENCOUNTER — Other Ambulatory Visit: Payer: Self-pay | Admitting: Family

## 2020-11-14 DIAGNOSIS — M5416 Radiculopathy, lumbar region: Secondary | ICD-10-CM

## 2020-11-15 ENCOUNTER — Ambulatory Visit: Payer: Medicare Other | Attending: Family | Admitting: Physical Therapy

## 2020-11-15 ENCOUNTER — Other Ambulatory Visit: Payer: Self-pay

## 2020-11-15 ENCOUNTER — Encounter: Payer: Self-pay | Admitting: Physical Therapy

## 2020-11-15 DIAGNOSIS — G8929 Other chronic pain: Secondary | ICD-10-CM | POA: Insufficient documentation

## 2020-11-15 DIAGNOSIS — R252 Cramp and spasm: Secondary | ICD-10-CM | POA: Diagnosis present

## 2020-11-15 DIAGNOSIS — M6281 Muscle weakness (generalized): Secondary | ICD-10-CM | POA: Diagnosis present

## 2020-11-15 DIAGNOSIS — M5441 Lumbago with sciatica, right side: Secondary | ICD-10-CM | POA: Insufficient documentation

## 2020-11-15 NOTE — Therapy (Signed)
[facility] [address]  [place], [place], [address] Phone: [phone]   Fax:  [phone]  Physical Therapy Treatment  Patient Details  Name: MCKENZI BUONOMO MRN: [id number] Date of Birth: November 27, 1950 Referring Provider (PT): Marrian Salvage, FNP   Encounter Date: 11/15/2020   PT End of Session - 11/15/20 0944     Visit Number 8    Number of Visits 12    Date for PT Re-Evaluation 11/28/20    Authorization Type U[employer] Medicare    Progress Note Due on Visit 10    PT Start Time 0935    PT Stop Time 1015    PT Time Calculation (min) 40 min    Activity Tolerance Patient tolerated treatment well    Behavior During Therapy [facility] for tasks assessed/performed             Past Medical History:  Diagnosis Date   CKD (chronic kidney disease)    Diabetes mellitus without complication ([place])    Hyperlipidemia    Hypertension     Past Surgical History:  Procedure Laterality Date   BREAST BIOPSY     BREAST EXCISIONAL BIOPSY Left     There were no vitals filed for this visit.   Subjective Assessment - 11/15/20 0943     Subjective Patient reports her back is feeling better, her R knee bothers her more than her back now, although she does get stiff easily.    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 Mild multilevel disc  space height loss and osteophytosis, as well as mild multilevel  facet degenerative change of the lower levels. Lumbar disc and  neural foraminal pathology may be further evaluated by MRI if  indicated by neurologically localizing signs and symptoms.    Patient Stated Goals be more active, be able to sleep on R side without pain, decrease pain    Currently in Pain? Yes    Pain Score 2     Pain Location Back     Pain Orientation Right    Multiple Pain Sites Yes    Pain Score 5    Pain Location Knee    Pain Orientation Right                               [facility] Adult PT Treatment/Exercise - 11/15/20 0001       Self-Care   Self-Care Other Self-Care Comments    Other Self-Care Comments  education on strategies to minimize LBP with long car rides - lumbar support, frequent breaks, stretches, etc.      Lumbar Exercises: Stretches   Other Lumbar Stretch Exercise hip flexor stretch in standing 2 x 30 sec bil      Lumbar Exercises: Aerobic   Tread Mill 6 min at 0.8 mph, grade 1.0   reported increased R groin pain 4-5/10   Nustep L5x75mn      Lumbar Exercises: Seated   Long Arc Quad on Chair Strengthening;Right;10 reps    LAQ on Chair Limitations with ankle DF for glide    Sit to Stand Limitations x10    Other Seated Lumbar Exercises ankle pumps  PT Short Term Goals - 10/29/20 1025       PT SHORT TERM GOAL #1   Title Pt will be independent with initial HEP for core strengthening    Time 2    Period Weeks    Status Achieved   10/29/20- performs daily   Target Date 10/29/20               PT Long Term Goals - 11/15/20 0945       PT LONG TERM GOAL #1   Title Pt. will be independent with progressed HEP to improve outcomes.    Time 6    Period Weeks    Status On-going   10/17- progressed  11/15/2020- met for current     PT LONG TERM GOAL #2   Title Pt. will demonstrate improved functional strength and mobility by improving 5x STS to <20 seconds    Baseline 27 seconds    Time 6    Period Weeks    Status Achieved   11/15/20- 16 seconds     PT LONG TERM GOAL #3   Title Pt. will report 75% improvement in sleep disruption due to pain.    Baseline cannot lay on R side, increased pain 7-8/10 in AM    Time 6    Period Weeks    Status Achieved   10/17- progressing  11/15/20- not being woken up due to pain, able to sleep on R side  again.     PT LONG TERM GOAL #4   Title Patient will report improved tolerance to standing/walking to 15 minutes without increased LBP    Time 6    Period Weeks    Status On-going   10/20- 450' ( ~4 min) walking before R radicular symptoms increased                  Plan - 11/15/20 1201     Clinical Impression Statement Patient is doing well, her R knee pain is bothering her more than her back, but still has pain that radiates into groin with prolonged walking, but walking with flexion, like pushing a cart, does not bother her, consistent with stenosis seen on recent MRI.  Today initiated treadmill training with slight incline to improve tolerance to walking she was able to walk for 6 weeks without significant increase in groin pain, which resolved after taking short seated rest break.  ALso given gentle hip flexor stretch today, and discussed strategies to use with long car rides.  Patient would benefit from continued skilled therapy.    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  Consider DN to R piriformis/glut med and lumbar paraspinals; progress core strengthening    Consulted and Agree with Plan of Care Patient             Patient will benefit from skilled therapeutic intervention in order to improve  the following deficits and impairments:  Decreased activity tolerance, Decreased endurance, Decreased range of motion, Decreased strength, Increased fascial restricitons, Improper body mechanics, Pain, Decreased balance, Decreased mobility, Difficulty  walking, Increased muscle spasms, Impaired flexibility, Postural dysfunction  Visit Diagnosis: Chronic midline low back pain with right-sided sciatica  Cramp and spasm  Muscle weakness (generalized)     Problem List Patient Active Problem List   Diagnosis Date Noted   Flank pain 10/14/2019   Type 2 diabetes mellitus with diabetic polyneuropathy, with long-term current use of insulin ([place]) 12/01/2018   Type 2 diabetes mellitus with retinopathy, with long-term current use of insulin ([place]) 12/01/2018   Type 2 diabetes mellitus with stage 3b chronic kidney disease, with long-term current use of insulin ([place]) 11/30/2018   Non-intractable vomiting    Acute hepatitis 07/13/2018   Acute on chronic renal insufficiency 07/12/2018   Diabetes mellitus ([place]) 12/08/2016   Essential hypertension 12/08/2016   Hyperlipidemia associated with type 2 diabetes mellitus ([place]) 12/08/2016    Rennie Natter, PT, DPT  11/15/2020, 12:13 PM  [facility] [address]  [place], [place], [address] Phone: [phone]   Fax:  [phone]  Name: SKYLINN VIALPANDO MRN: [id number] Date of Birth: 06-09-1950

## 2020-11-19 ENCOUNTER — Other Ambulatory Visit: Payer: Self-pay

## 2020-11-19 ENCOUNTER — Encounter: Payer: Self-pay | Admitting: Physical Therapy

## 2020-11-19 ENCOUNTER — Ambulatory Visit: Payer: Medicare Other | Admitting: Physical Therapy

## 2020-11-19 DIAGNOSIS — M5441 Lumbago with sciatica, right side: Secondary | ICD-10-CM | POA: Diagnosis not present

## 2020-11-19 DIAGNOSIS — R252 Cramp and spasm: Secondary | ICD-10-CM

## 2020-11-19 DIAGNOSIS — G8929 Other chronic pain: Secondary | ICD-10-CM

## 2020-11-19 DIAGNOSIS — M6281 Muscle weakness (generalized): Secondary | ICD-10-CM

## 2020-11-19 NOTE — Therapy (Signed)
[facility] [address]  [place], [place], [address] Phone: [phone]   Fax:  [phone]  Physical Therapy Treatment  Patient Details  Name: Hayley Case MRN: [id number] Date of Birth: [birth year] Referring Provider (PT): Marrian Salvage, FNP   Encounter Date: 11/19/2020   PT End of Session - 11/19/20 1314     Visit Number 9    Number of Visits 12    Date for PT Re-Evaluation 11/28/20    Authorization Type UHC Medicare    Progress Note Due on Visit 10    PT Start Time 1315    PT Stop Time 1359    PT Time Calculation (min) 44 min    Activity Tolerance Patient tolerated treatment well    Behavior During Therapy WFL for tasks assessed/performed             Past Medical History:  Diagnosis Date   CKD (chronic kidney disease)    Diabetes mellitus without complication ([place])    Hyperlipidemia    Hypertension     Past Surgical History:  Procedure Laterality Date   BREAST BIOPSY     BREAST EXCISIONAL BIOPSY Left     There were no vitals filed for this visit.   Subjective Assessment - 11/19/20 1832     Subjective Patient reports no new concerns today, feels like she is getting stronger, used education about lifting this weekend when got new delivery of supplies.    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 Mild multilevel disc  space height loss and osteophytosis, as well as mild multilevel  facet degenerative change of the lower levels. Lumbar disc and  neural foraminal pathology may be further evaluated by MRI if  indicated by neurologically localizing signs and symptoms.    Patient Stated Goals be more active, be able to sleep on R side without pain, decrease pain                                [facility] Adult PT Treatment/Exercise - 11/19/20 0001       Lumbar Exercises: Stretches   Piriformis Stretch Right;2 reps;30 seconds;Left    Other Lumbar Stretch Exercise lumbar flexion in sitting with swiss ball 2 x 10      Lumbar Exercises: Aerobic   Tread Mill 2 x 6 min at 0.54mh and 1.0 incline      Lumbar Exercises: Seated   Other Seated Lumbar Exercises --      Lumbar Exercises: Supine   Bridge 5 reps   reports increased pulling in lower ab today     Lumbar Exercises: Sidelying   Clam Right;Both;20 reps    Clam Limitations red TB    Other Sidelying Lumbar Exercises open books x 10 bil      Manual Therapy   Manual Therapy Soft tissue mobilization;Myofascial release;Joint mobilization    Manual therapy comments in prone to decrease muscle spasm    Joint Mobilization R UPA mobs to L1-L4 grade 2-3    Soft tissue mobilization IASTM with foam roller to bil glutes, QL, prox hamstrings, and lumbar paraspinals  PT Short Term Goals - 10/29/20 1025       PT SHORT TERM GOAL #1   Title Pt will be independent with initial HEP for core strengthening    Time 2    Period Weeks    Status Achieved   10/29/20- performs daily   Target Date 10/29/20               PT Long Term Goals - 11/19/20 1342       PT LONG TERM GOAL #1   Title Pt. will be independent with progressed HEP to improve outcomes.    Time 6    Period Weeks    Status On-going   10/17- progressed  11/15/2020- met for current     PT LONG TERM GOAL #2   Title Pt. will demonstrate improved functional strength and mobility by improving 5x STS to <20 seconds    Baseline 27 seconds    Time 6    Period Weeks    Status Achieved   11/15/20- 16 seconds     PT LONG TERM GOAL #3   Title Pt. will report 75% improvement in sleep disruption due to pain.    Baseline cannot lay on R side, increased pain 7-8/10 in AM    Time 6    Period Weeks    Status  Achieved   10/17- progressing  11/15/20- not being woken up due to pain, able to sleep on R side again.     PT LONG TERM GOAL #4   Title Patient will report improved tolerance to standing/walking to 15 minutes without increased LBP    Time 6    Period Weeks    Status On-going   10/20- 450' ( ~4 min) walking before R radicular symptoms increased  11/19/20- able to walk 2 x 6 min on treadmill with break for foward flexion to decrease complaint RLE pain.                  Plan - 11/19/20 1833     Clinical Impression Statement Patient reports she is doing well today.   She entered without pain, but had some increase in groin pain after first trial of treadmill walking, alleviated with stretches, and no pain with second trial.  She did report some pulling in lower abdomen with bridges today, but no difficulty with neutral spine, She would benefit from continued skilled therapy to improve activity tolerance.    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  Consider DN to R piriformis/glut med and lumbar paraspinals; progress core strengthening    Consulted and Agree with Plan of Care Patient             Patient will benefit from skilled therapeutic intervention in order to improve the following deficits and  impairments:  Decreased activity tolerance, Decreased endurance, Decreased range of motion, Decreased strength, Increased fascial restricitons, Improper body mechanics, Pain, Decreased balance, Decreased mobility, Difficulty walking, Increased muscle spasms, Impaired flexibility, Postural dysfunction  Visit Diagnosis: Chronic midline low back pain with right-sided sciatica  Cramp and spasm  Muscle weakness (generalized)     Problem List Patient Active Problem List   Diagnosis Date Noted   Flank pain 10/14/2019   Type 2 diabetes mellitus with diabetic polyneuropathy, with long-term current use of insulin ([place]) 12/01/2018   Type 2 diabetes mellitus with retinopathy, with long-term current use of insulin ([place]) 12/01/2018   Type 2 diabetes mellitus with stage 3b chronic kidney disease, with long-term current use of insulin ([place]) 11/30/2018   Non-intractable vomiting    Acute hepatitis 07/13/2018   Acute on chronic renal insufficiency 07/12/2018   Diabetes mellitus ([place]) 12/08/2016   Essential hypertension 12/08/2016   Hyperlipidemia associated with type 2 diabetes mellitus ([place]) 12/08/2016    Rennie Natter, PT, DPT 11/19/2020, 6:38 PM  [facility] [address]  [place], [place], [address] Phone: [phone]   Fax:  [phone]  Name: Hayley Case MRN: [id number] Date of Birth: 26-Oct-1950

## 2020-11-26 ENCOUNTER — Ambulatory Visit: Payer: Medicare Other

## 2020-11-29 ENCOUNTER — Ambulatory Visit: Payer: Medicare Other

## 2020-12-10 ENCOUNTER — Ambulatory Visit (INDEPENDENT_AMBULATORY_CARE_PROVIDER_SITE_OTHER): Payer: Medicare Other | Admitting: Psychology

## 2020-12-10 DIAGNOSIS — F4323 Adjustment disorder with mixed anxiety and depressed mood: Secondary | ICD-10-CM | POA: Diagnosis not present

## 2020-12-11 ENCOUNTER — Ambulatory Visit: Payer: Medicare Other | Admitting: Physical Therapy

## 2020-12-11 ENCOUNTER — Other Ambulatory Visit: Payer: Self-pay

## 2020-12-11 DIAGNOSIS — R252 Cramp and spasm: Secondary | ICD-10-CM

## 2020-12-11 DIAGNOSIS — M5441 Lumbago with sciatica, right side: Secondary | ICD-10-CM

## 2020-12-11 DIAGNOSIS — M6281 Muscle weakness (generalized): Secondary | ICD-10-CM

## 2020-12-11 DIAGNOSIS — G8929 Other chronic pain: Secondary | ICD-10-CM

## 2020-12-11 NOTE — Therapy (Signed)
[facility] [address]  [place], [place], [address] Phone: [phone]   Fax:  [phone]  Physical Therapy Treatment  Patient Details  Name: Hayley Case MRN: [id number] Date of Birth: [birth year] Referring Provider (PT): Marrian Salvage, FNP   Encounter Date: 12/11/2020   PT End of Session - 12/11/20 1116     Visit Number 10    Number of Visits 12    Date for PT Re-Evaluation 11/28/20    Authorization Type UHC Medicare    Progress Note Due on Visit 10    PT Start Time 1114    PT Stop Time 1200    PT Time Calculation (min) 46 min    Activity Tolerance Patient tolerated treatment well    Behavior During Therapy WFL for tasks assessed/performed             Past Medical History:  Diagnosis Date   CKD (chronic kidney disease)    Diabetes mellitus without complication ([place])    Hyperlipidemia    Hypertension     Past Surgical History:  Procedure Laterality Date   BREAST BIOPSY     BREAST EXCISIONAL BIOPSY Left     There were no vitals filed for this visit.   Subjective Assessment - 12/11/20 1206     Subjective Reports nothing new. Notes some cramping on the front of her leg while walking.    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 Mild multilevel disc  space height loss and osteophytosis, as well as mild multilevel  facet degenerative change of the lower levels. Lumbar disc and  neural foraminal pathology may be further evaluated by MRI if  indicated by neurologically localizing signs and symptoms.    Patient Stated Goals be more active, be able to sleep on R side without pain, decrease pain                               [facility] Adult PT Treatment/Exercise - 12/11/20 0001        Lumbar Exercises: Stretches   Quad Stretch 30 seconds;Right    Piriformis Stretch Right;2 reps;30 seconds;Left    Other Lumbar Stretch Exercise modified thomas stretch x30 sec on R; seated hip flexor stretch x30 sec on R    Other Lumbar Stretch Exercise TFL stretch x60 sec      Lumbar Exercises: Aerobic   Tread Mill 2 x 6 min at 1 mph, 1 incline      Lumbar Exercises: Supine   Straight Leg Raise 20 reps      Lumbar Exercises: Sidelying   Clam Right;Both;20 reps    Clam Limitations red tband      Lumbar Exercises: Prone   Straight Leg Raise 20 reps    Other Prone Lumbar Exercises Hamstring curl 2x10 red tband      Manual Therapy   Soft tissue mobilization IASTM R glute/piriformis and quads. TPR of TFL and quads                     PT Education - 12/11/20 1206     Education Details Discussed hip stretches    Person(s) Educated Patient    Methods Explanation;Demonstration  Comprehension Verbalized understanding;Returned demonstration              PT Short Term Goals - 10/29/20 1025       PT SHORT TERM GOAL #1   Title Pt will be independent with initial HEP for core strengthening    Time 2    Period Weeks    Status Achieved   10/29/20- performs daily   Target Date 10/29/20               PT Long Term Goals - 11/19/20 1342       PT LONG TERM GOAL #1   Title Pt. will be independent with progressed HEP to improve outcomes.    Time 6    Period Weeks    Status On-going   10/17- progressed  11/15/2020- met for current     PT LONG TERM GOAL #2   Title Pt. will demonstrate improved functional strength and mobility by improving 5x STS to <20 seconds    Baseline 27 seconds    Time 6    Period Weeks    Status Achieved   11/15/20- 16 seconds     PT LONG TERM GOAL #3   Title Pt. will report 75% improvement in sleep disruption due to pain.    Baseline cannot lay on R side, increased pain 7-8/10 in AM    Time 6    Period Weeks    Status Achieved    10/17- progressing  11/15/20- not being woken up due to pain, able to sleep on R side again.     PT LONG TERM GOAL #4   Title Patient will report improved tolerance to standing/walking to 15 minutes without increased LBP    Time 6    Period Weeks    Status On-going   10/20- 450' ( ~4 min) walking before R radicular symptoms increased  11/19/20- able to walk 2 x 6 min on treadmill with break for foward flexion to decrease complaint RLE pain.                  Plan - 12/11/20 1132     Clinical Impression Statement Treatment focused on continuing to strengthen and stretch pt's hips. Worked to progress pt's walking. Manual therapy provided for multiple trigger points found in pt's R quad and glutes/piriformis. Worked on stretching pt's hip flexors/quads as this may be contributing to her groin pain and "cramping" especially since pt sleeps sitting up in a recliner.    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  Consider DN to R piriformis/glut med and lumbar paraspinals; progress core strengthening    Consulted and Agree with Plan of Care Patient             Patient will benefit from skilled therapeutic intervention in order to improve the following deficits and impairments:  Decreased activity tolerance,  Decreased endurance, Decreased range of motion, Decreased strength, Increased fascial restricitons, Improper body mechanics, Pain, Decreased balance, Decreased mobility, Difficulty walking, Increased muscle spasms, Impaired flexibility, Postural dysfunction  Visit Diagnosis: Chronic  midline low back pain with right-sided sciatica  Cramp and spasm  Muscle weakness (generalized)     Problem List Patient Active Problem List   Diagnosis Date Noted   Flank pain 10/14/2019   Type 2 diabetes mellitus with diabetic polyneuropathy, with long-term current use of insulin ([place]) 12/01/2018   Type 2 diabetes mellitus with retinopathy, with long-term current use of insulin ([place]) 12/01/2018   Type 2 diabetes mellitus with stage 3b chronic kidney disease, with long-term current use of insulin ([place]) 11/30/2018   Non-intractable vomiting    Acute hepatitis 07/13/2018   Acute on chronic renal insufficiency 07/12/2018   Diabetes mellitus ([place]) 12/08/2016   Essential hypertension 12/08/2016   Hyperlipidemia associated with type 2 diabetes mellitus [facility]) 12/08/2016    Apolonio Cutting April Gordy Levan, PT, DPT 12/11/2020, 12:09 PM  [facility] [address]  [place], [place], [address] Phone: [phone]   Fax:  [phone]  Name: FAYLINN SCHWENN MRN: [id number] Date of Birth: 08/21/1950

## 2020-12-14 ENCOUNTER — Other Ambulatory Visit: Payer: Self-pay

## 2020-12-14 ENCOUNTER — Encounter: Payer: Self-pay | Admitting: Physical Therapy

## 2020-12-14 ENCOUNTER — Ambulatory Visit: Payer: Medicare Other | Attending: Family | Admitting: Physical Therapy

## 2020-12-14 ENCOUNTER — Telehealth: Payer: Self-pay | Admitting: Family

## 2020-12-14 DIAGNOSIS — M6281 Muscle weakness (generalized): Secondary | ICD-10-CM | POA: Insufficient documentation

## 2020-12-14 DIAGNOSIS — R252 Cramp and spasm: Secondary | ICD-10-CM | POA: Diagnosis present

## 2020-12-14 DIAGNOSIS — G8929 Other chronic pain: Secondary | ICD-10-CM | POA: Insufficient documentation

## 2020-12-14 DIAGNOSIS — M5441 Lumbago with sciatica, right side: Secondary | ICD-10-CM | POA: Diagnosis not present

## 2020-12-14 NOTE — Telephone Encounter (Signed)
Please ask her to stop her Crestor; she needs to stay on the Zetia. I would like her to discuss options for cholesterol management with her cardiologist other than statin therapy.

## 2020-12-14 NOTE — Telephone Encounter (Signed)
I have called pt back and relayed the message from the provider. Pt stated understanding.

## 2020-12-14 NOTE — Therapy (Signed)
[facility] [address]  [place], [place], [address] Phone: [phone]   Fax:  [phone]  Physical Therapy Treatment Progress Note Reporting Period 10/17/2020 to 12/14/2020  See note below for Objective Data and Assessment of Progress/Goals.     Patient Details  Name: Hayley Case MRN: [id number] Date of Birth: 06-14-1950 Referring Provider (PT): Marrian Salvage, FNP   Encounter Date: 12/14/2020   PT End of Session - 12/14/20 1028     Visit Number 11    Number of Visits 12    Date for PT Re-Evaluation 01/11/21    Authorization Type [employer] Medicare    Progress Note Due on Visit 10    PT Start Time 1019    PT Stop Time 1101    PT Time Calculation (min) 42 min    Activity Tolerance Patient tolerated treatment well    Behavior During Therapy WFL for tasks assessed/performed             Past Medical History:  Diagnosis Date   CKD (chronic kidney disease)    Diabetes mellitus without complication ([place])    Hyperlipidemia    Hypertension     Past Surgical History:  Procedure Laterality Date   BREAST BIOPSY     BREAST EXCISIONAL BIOPSY Left     There were no vitals filed for this visit.   Subjective Assessment - 12/14/20 1027     Subjective Patient reports that she feels PT has helped, noticed the week she was unable to attend had more pain in L thigh.  No pain today.    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 Mild multilevel disc  space height loss and osteophytosis, as well as mild multilevel  facet degenerative change of the lower levels. Lumbar disc and  neural foraminal pathology may be further evaluated by MRI if  indicated by neurologically localizing signs and symptoms.    Patient Stated Goals be more  active, be able to sleep on R side without pain, decrease pain    Currently in Pain? No/denies                [facility] PT Assessment - 12/14/20 0001       Assessment   Medical Diagnosis M54.41,G89.29 (ICD-10-CM) - Chronic right-sided low back pain with right-sided sciatica    Referring Provider (PT) Marrian Salvage, FNP    Onset Date/Surgical Date 09/13/20   acute on chronic     Observation/Other Assessments   Focus on Therapeutic Outcomes (FOTO)  26                           [facility] PT Treatment/Exercise - 12/14/20 0001       Self-Care   Other Self-Care Comments  education on standing posture to relieve LBP, review of current plan, goals and concerns, educated on side effects of statins, recommended contacting PCP.      Exercises   Exercises Lumbar      Lumbar Exercises: Aerobic   Tread Mill 2 x 6 min at 1 mph, 1% incline    Nustep [id number]      Manual Therapy   Manual Therapy Soft tissue mobilization    Manual therapy  comments to decrease muscle spasm and pain    Soft tissue mobilization IASTM to R quads, TFL                       PT Short Term Goals - 10/29/20 1025       PT SHORT TERM GOAL #1   Title Pt will be independent with initial HEP for core strengthening    Time 2    Period Weeks    Status Achieved   10/29/20- performs daily   Target Date 10/29/20               PT Long Term Goals - 12/14/20 1031       PT LONG TERM GOAL #1   Title Pt. will be independent with progressed HEP to improve outcomes.    Time 6    Period Weeks    Status On-going   10/17- progressed  11/15/2020- met for current     PT LONG TERM GOAL #2   Title Pt. will demonstrate improved functional strength and mobility by improving 5x STS to <20 seconds    Baseline 27 seconds    Time 6    Period Weeks    Status Achieved   11/15/20- 16 seconds     PT LONG TERM GOAL #3   Title Pt. will report 75% improvement in sleep disruption due to  pain.    Baseline cannot lay on R side, increased pain 7-8/10 in AM    Time 6    Period Weeks    Status Achieved   10/17- progressing  11/15/20- not being woken up due to pain, able to sleep on R side again.     PT LONG TERM GOAL #4   Title Patient will report improved tolerance to standing/walking to 15 minutes without increased LBP    Time 6    Period Weeks    Status On-going   10/20- 450' ( ~4 min) walking before R radicular symptoms increased  11/19/20- able to walk 2 x 6 min on treadmill with break for foward flexion to decrease complaint RLE pain.  12/2- 5 minutes.   Target Date 01/11/21                   Plan - 12/14/20 1058     Clinical Impression Statement Pt. reports continued cramping in R thigh and increased pain radiating to R groin with standing/walking.  Otherwise she has made good progress and met all goals except standing/walking tolerance.  Her FOTO has improved from 40 to 52%.  She does report R knee buckling due to pain at home, and connects this to starting rouvastatin in October.  Contacted PCP to inform of mylagia and increased risk of falls.  Due to continued limitations with standing and walking, and report of muscle bickling, extended visits to 01/11/21 to continue progress.    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  Consider DN to R piriformis/glut med and lumbar paraspinals; progress core strengthening    Consulted and  Agree with Plan of Care Patient             Patient will benefit from skilled therapeutic intervention in order to improve the following deficits and impairments:  Decreased activity tolerance, Decreased endurance, Decreased range of motion, Decreased strength, Increased fascial restricitons, Improper body mechanics, Pain, Decreased balance, Decreased mobility, Difficulty walking, Increased muscle spasms, Impaired flexibility, Postural dysfunction  Visit Diagnosis: Chronic midline low back pain with right-sided sciatica  Cramp and spasm  Muscle weakness (generalized)     Problem List Patient Active Problem List   Diagnosis Date Noted   Flank pain 10/14/2019   Type 2 diabetes mellitus with diabetic polyneuropathy, with long-term current use of insulin ([place]) 12/01/2018   Type 2 diabetes mellitus with retinopathy, with long-term current use of insulin ([place]) 12/01/2018   Type 2 diabetes mellitus with stage 3b chronic kidney disease, with long-term current use of insulin ([place]) 11/30/2018   Non-intractable vomiting    Acute hepatitis 07/13/2018   Acute on chronic renal insufficiency 07/12/2018   Diabetes mellitus ([place]) 12/08/2016   Essential hypertension 12/08/2016   Hyperlipidemia associated with type 2 diabetes mellitus ([place]) 12/08/2016    Rennie Natter, PT, DPT  12/14/2020, 12:05 PM  [facility] [address]  [place], [place], [address] Phone: [phone]   Fax:  [phone]  Name: SAHITI JOSWICK MRN: [id number] Date of Birth: [birth year]

## 2020-12-14 NOTE — Telephone Encounter (Signed)
-----   Message from Rennie Natter, PT sent at 12/14/2020 10:33 AM EST ----- Regarding: myalgia with statin Mrs. Luhmann is here with me today, and reports that since she started the rouvastatin, she has had significant increase in R thigh pain, to the point her leg buckles and she has fallen.  Could you contact if she needs medication adjustment.  She reports she was hospitalized for reaction to the lipitor.   Thank you,   Sharlene Motts, PT

## 2020-12-14 NOTE — Telephone Encounter (Signed)
I have called pt and there was no answer so I left a message to call back.

## 2020-12-18 ENCOUNTER — Encounter: Payer: Medicare Other | Admitting: Physical Therapy

## 2020-12-18 ENCOUNTER — Ambulatory Visit: Payer: Medicare Other | Admitting: Internal Medicine

## 2020-12-18 ENCOUNTER — Telehealth: Payer: Self-pay | Admitting: Cardiovascular Disease

## 2020-12-18 NOTE — Telephone Encounter (Signed)
  Pt c/o medication issue:  1. Name of Medication: rosuvastatin (CRESTOR) 20 MG tablet  2. How are you currently taking this medication (dosage and times per day)? Take 1 tablet (20 mg total) by mouth daily.  3. Are you having a reaction (difficulty breathing--STAT)?   4. What is your medication issue? Pt said she still having side effects with this meds, she said she gets muscle pains and would like to ask if Dr. Audie Box can change this meds

## 2020-12-18 NOTE — Telephone Encounter (Signed)
Call attempted x2 -- "call cannot be completed as dialed"

## 2020-12-19 ENCOUNTER — Encounter: Payer: Medicare Other | Admitting: Physical Therapy

## 2020-12-19 MED ORDER — EZETIMIBE 10 MG PO TABS
10.0000 mg | ORAL_TABLET | Freq: Every day | ORAL | 1 refills | Status: DC
Start: 2020-12-19 — End: 2021-02-25

## 2020-12-19 NOTE — Telephone Encounter (Signed)
Spoke with pt, she reports she just does not want to take the crestor anymore and would like to try something else. Will make dr Audie Box aware.

## 2020-12-19 NOTE — Telephone Encounter (Signed)
Spoke with pt, she reports she is taking zetia 10 mg once daily

## 2020-12-19 NOTE — Telephone Encounter (Signed)
Spoke with pt, she has been off the crestor for about 1 week now and she does feel like her her symptoms have improved. She also reports having problems with lipitor. She is willing to talk with the pharm md if needed. Aware will forward to dr Audie Box and let her know his recommendations.

## 2020-12-21 NOTE — Telephone Encounter (Signed)
Called patient, she states the last time she tried lipitor she had a reaction to it as well that caused her to end up at the hospital.  I advised I would notify MD and we would see what he suggest to do, if anything different.

## 2020-12-21 NOTE — Telephone Encounter (Signed)
Called patient, aware of response from MD.  Patient verbalized understanding.

## 2020-12-24 ENCOUNTER — Ambulatory Visit (INDEPENDENT_AMBULATORY_CARE_PROVIDER_SITE_OTHER): Payer: Medicare Other | Admitting: Psychology

## 2020-12-24 DIAGNOSIS — F4323 Adjustment disorder with mixed anxiety and depressed mood: Secondary | ICD-10-CM

## 2020-12-24 NOTE — Progress Notes (Signed)
[facility] Counselor/Therapist Progress Note  Patient ID: Hayley Case, MRN: [id number],    Date: 12/24/2020  Time Spent: 60 minutes   Treatment Type: Individual Therapy  Reported Symptoms: stress and overwhelm  Mental Status Exam: Appearance:  Casual     Behavior: Appropriate  Motor: Normal  Speech/Language:  Normal Rate  Affect: Appropriate  Mood: normal  Thought process: normal  Thought content:   WNL  Sensory/Perceptual disturbances:   WNL  Orientation: oriented to person, place, time/date, and situation  Attention: Good  Concentration: Good  Memory: WNL  Fund of knowledge:  Good  Insight:   Good  Judgment:  Good  Impulse Control: Good   Risk Assessment: Danger to Self:  No Self-injurious Behavior: No Danger to Others: No Duty to Warn:no Physical Aggression / Violence:No  Access to Firearms a concern: No  Gang Involvement:No   Subjective: Pt present for face-to-face individual therapy via video Webex.  Pt consents to telehealth video session due to COVID 19 pandemic. Location of pt: home. Location of therapist: home office.  Pt talked about health concerns.  She is having negative side effects from her cholesterol medication.  She feels like her medical provider is not listening to her concerns.   Helped pt process her feelings and concerns and worked on problem solving issues with medical providers.   Pt talked about caregiving for her son.  Pt's son has had some complication regarding his dialysis.  Pt tends to worry about him.   Worked on managing worry thoughts. Pt talked about getting lonely at times.  Once pt is done caregiving for her son she hopes to move back to [place].  She was living with her daughter there and misses her family in [place].  Pt talked about her challenges with healthy eating and sleep.   Provided supportive therapy.     Interventions: Cognitive Behavioral Therapy and Insight-Oriented  Diagnosis: F43.23  Plan:  See pt's Treatment Plan for depression and anxiety in Therapy Charts.  (Treatment Plan Target Date: 11/05/2021) Pt is progressing with treatment goals.   Plan to continue to see pt monthly.    Grier Czerwinski, LCSW

## 2020-12-25 ENCOUNTER — Encounter: Payer: Self-pay | Admitting: Internal Medicine

## 2020-12-25 ENCOUNTER — Other Ambulatory Visit: Payer: Self-pay

## 2020-12-25 ENCOUNTER — Ambulatory Visit: Payer: Medicare Other

## 2020-12-25 ENCOUNTER — Ambulatory Visit (INDEPENDENT_AMBULATORY_CARE_PROVIDER_SITE_OTHER): Payer: Medicare Other | Admitting: Internal Medicine

## 2020-12-25 VITALS — BP 130/86 | HR 100 | Ht 64.0 in | Wt 205.0 lb

## 2020-12-25 DIAGNOSIS — E1142 Type 2 diabetes mellitus with diabetic polyneuropathy: Secondary | ICD-10-CM | POA: Diagnosis not present

## 2020-12-25 DIAGNOSIS — Z794 Long term (current) use of insulin: Secondary | ICD-10-CM

## 2020-12-25 DIAGNOSIS — E1122 Type 2 diabetes mellitus with diabetic chronic kidney disease: Secondary | ICD-10-CM | POA: Diagnosis not present

## 2020-12-25 DIAGNOSIS — M5441 Lumbago with sciatica, right side: Secondary | ICD-10-CM

## 2020-12-25 DIAGNOSIS — N1832 Chronic kidney disease, stage 3b: Secondary | ICD-10-CM

## 2020-12-25 DIAGNOSIS — E11319 Type 2 diabetes mellitus with unspecified diabetic retinopathy without macular edema: Secondary | ICD-10-CM

## 2020-12-25 DIAGNOSIS — G8929 Other chronic pain: Secondary | ICD-10-CM

## 2020-12-25 DIAGNOSIS — R252 Cramp and spasm: Secondary | ICD-10-CM

## 2020-12-25 DIAGNOSIS — M6281 Muscle weakness (generalized): Secondary | ICD-10-CM

## 2020-12-25 LAB — POCT GLYCOSYLATED HEMOGLOBIN (HGB A1C): Hemoglobin A1C: 7.9 % — AB (ref 4.0–5.6)

## 2020-12-25 NOTE — Progress Notes (Signed)
Name: Hayley Case  Age/ Sex: 70 y.o., female   MRN/ DOB: [id number], 1950/12/01     PCP: Marrian Salvage, FNP   Reason for Endocrinology Evaluation: Type 2 Diabetes Mellitus  Initial Endocrine Consultative Visit: 11/30/2018    PATIENT IDENTIFIER: Hayley Case is a 70 y.o. female with a past medical history of HTN, T2DM, PAD, coronary artery disease and Dyslipidemia . The patient has followed with Endocrinology clinic since 11/30/2018 for consultative assistance with management of her diabetes.  DIABETIC HISTORY:  Hayley Case was diagnosed with T2DM > 20 yrs ago. Hayley Case was on metformin, Trulicity, and Byetta. Has been on insulin since ~ 2010.  Her hemoglobin A1c has ranged from 7.6 % in 2019, peaking at 10.7 % in 2020.     On her initial visit to our clinic, Hayley Case had an A1c of 11.8%. Hayley Case was on lantus/humalog but was not taking regularly, switched to insulin mix for ease of take.    Was on Lipitor but caused elevated LFT's requiring hospitalization.   Moved from [place] to help her son who is on peritoneal dialysis. Has lost another son to MI    By 06/2019 we attempted to put her on the V-Go but was cost prohibitive.   By 09/2019 we switched insulin mix to MDi regimen due to persistent hyperglycemia  SUBJECTIVE:   During the last visit (06/12/2020): A1c 8.4 %, adjusted MDI regimen and continued Rybelsus     Today (12/25/2020): Hayley Case is here for a follow up on diabetes.  Hayley Case checks her blood sugars multiple times daily,through CGM.  preprandial. The patient has had hypoglycemic episodes since the last clinic visit.The patient is symptomatic with these episodes.      Hayley Case met with Mickel Baas  Hayley Case is doing PT due to joint pain  which is improving   Her joint pain resolved with discontinuation with statin therapy , currently on Zetia   Has some nausea and constipation  Sees a therapist as well  Son continues with dialysis, pending weight loss sx to qualify for  transplant   HOME DIABETES REGIMEN:  Toujeo 36 units once daily Humalog 12 units with each meal Rybelsus 7 mg 1 tablet with breakfast CF: Humalog (BG -130/30)      Statin: Off due to elevated LFT's and myalgias  ACE-I/ARB: Yes     CONTINUOUS GLUCOSE MONITORING RECORD INTERPRETATION    Dates of Recording: 1/29- 02/23/2021 ( Incorrect dates)   Sensor description:freestyle libre  Results statistics:   CGM use % of time 21  Average and SD 176/39.3  Time in range   53 %  % Time Above 180 32  % Time above 250 15  % Time Below target 0    Glycemic patterns summary: No data during the day but has been noted with lat night hyperglycemia and trends down over night   Hyperglycemic episodes  postprandial   Hypoglycemic episodes occurred fasting   Overnight periods: Trends down         DIABETIC COMPLICATIONS: Microvascular complications:  CKD III, DR , neuropathy  Last eye exam: Completed > 1 yr    Macrovascular complications:   PVD Denies: CAD, CVA   HISTORY:  Past Medical History:  Past Medical History:  Diagnosis Date   CKD (chronic kidney disease)    Diabetes mellitus without complication ([place])    Hyperlipidemia    Hypertension    Past Surgical History:  Past Surgical History:  Procedure Laterality Date  BREAST BIOPSY     BREAST EXCISIONAL BIOPSY Left    Social History:  reports that Hayley Case has never smoked. Hayley Case has never used smokeless tobacco. Hayley Case reports that Hayley Case does not drink alcohol and does not use drugs. Family History:  Family History  Problem Relation Age of Onset   Skin cancer Mother    Heart attack Father    Colon cancer Sister      HOME MEDICATIONS: Allergies as of 12/25/2020       Reactions   Lisinopril Cough   Lipitor [atorvastatin] Other (See Comments)   Liver function   Penicillins Hives, Itching   Did it involve swelling of the face/tongue/throat, SOB, or low BP? No Did it involve sudden or severe rash/hives, skin  peeling, or any reaction on the inside of your mouth or nose? Yes Did you need to seek medical attention at a hospital or doctor's office? Yes When did it last happen?      2012 If all above answers are "NO", may proceed with cephalosporin use.   Septra [sulfamethoxazole-trimethoprim] Hives        Medication List        Accurate as of December 25, 2020  9:51 AM. If you have any questions, ask your nurse or doctor.          acetaminophen 500 MG tablet Commonly known as: TYLENOL Take 500 mg by mouth every 4 (four) hours as needed for headache (pain). 625m   albuterol 108 (90 Base) MCG/ACT inhaler Commonly known as: VENTOLIN HFA Inhale 2 puffs into the lungs every 6 (six) hours as needed for wheezing or shortness of breath.   amLODipine 10 MG tablet Commonly known as: NORVASC TAKE 1 TABLET BY MOUTH DAILY   cholecalciferol 25 MCG (1000 UNIT) tablet Commonly known as: VITAMIN D3 Take 1,000 Units by mouth daily. Takes 2 tablets so 2,000   COLACE PO Take by mouth as needed.   ezetimibe 10 MG tablet Commonly known as: ZETIA Take 1 tablet (10 mg total) by mouth daily.   FreeStyle Libre 2 Sensor Misc Inject 1 Device into the skin as directed. Use as directed every 14 days. E11.21   HumaLOG KwikPen 200 UNIT/ML KwikPen Generic drug: insulin lispro Inject 12 Units into the skin with breakfast, with lunch, and with evening meal. Max daily 80 units with correction scale   loratadine 10 MG tablet Commonly known as: CLARITIN Take 10 mg by mouth daily as needed (seasonal allergies).   losartan 50 MG tablet Commonly known as: COZAAR Take 50 mg by mouth daily.   Misc. Devices Misc Please provide patient with insurance approved blood pressure monitor.   Pen Needles 32G X 4 MM Misc 1 Device by Does not apply route in the morning, at noon, in the evening, and at bedtime.   REFRESH TEARS OP Place 1 drop into both eyes at bedtime.   Rybelsus 7 MG Tabs Generic drug:  Semaglutide Take 7 mg by mouth daily at 2 PM.   Toujeo Max SoloStar 300 UNIT/ML Solostar Pen Generic drug: insulin glargine (2 Unit Dial) Inject 36 Units into the skin daily.   vitamin B-12 1000 MCG tablet Commonly known as: CYANOCOBALAMIN Take 1,000 mcg by mouth daily.         OBJECTIVE:   Vital Signs: BP 130/86 (BP Location: Left Arm, Patient Position: Sitting, Cuff Size: Small)   Pulse 100   Ht _0  (1.626 m)   Wt 205 lb (93 kg)   SpO2 99%  BMI 35.19 kg/m   Wt Readings from Last 3 Encounters:  12/25/20 205 lb (93 kg)  10/19/20 207 lb 3.2 oz (94 kg)  09/25/20 203 lb (92.1 kg)     Exam: General: Pt appears well and is in NAD  Lungs: Clear with good BS bilat with no rales, rhonchi, or wheezes  Heart: RRR with normal S1 and S2 and no gallops; no murmurs; no rub  Abdomen:  Soft with supra pubic tenderness  Extremities: Trace pretibial edema.  Neuro: MS is good with appropriate affect, pt is alert and Ox3       DM foot exam: 12/25/2020   The skin of the feet is intact without sores or ulcerations. The pedal pulses are 2+ on right and 2+ on left. The sensation is intact to a screening 5.07, 10 gram monofilament bilaterally     DATA REVIEWED:  Lab Results  Component Value Date   HGBA1C 7.9 (A) 12/25/2020   HGBA1C 8.4 (A) 06/12/2020   HGBA1C 8.5 (A) 02/13/2020     Latest Reference Range & Units 09/18/20 09:53  Sodium 135 - 145 mEq/L 141  Potassium 3.5 - 5.1 mEq/L 4.2  Chloride 96 - 112 mEq/L 106  CO2 19 - 32 mEq/L 26  Glucose 70 - 99 mg/dL 97  BUN 6 - 23 mg/dL 31 (H)  Creatinine 0.40 - 1.20 mg/dL 1.98 (H)  Calcium 8.4 - 10.5 mg/dL 9.3  Alkaline Phosphatase 39 - 117 U/L 105  Albumin 3.5 - 5.2 g/dL 3.6  AST 0 - 37 U/L 13  ALT 0 - 35 U/L 13  Total Protein 6.0 - 8.3 g/dL 6.8  Total Bilirubin 0.2 - 1.2 mg/dL 0.6  GFR >60.00 mL/min 25.33 (L)    ASSESSMENT / PLAN / RECOMMENDATIONS:   1) Type 2 Diabetes Mellitus, Poorly controlled, With CKD IV,  retinopathy and neuropathic  and macrovascular complications - Most recent A1c of 7.9 %. Goal A1c < 7.5 %.      - A1c continues to improve down from 8.4 % - Will not increase Rybelsus due to nausea  - We have attempted to put her on the V-go but that was cost prohibitive - Has bee noted with fasting hypoglycemia , will reduce basal rate  - No data during the day and I suspect this is the reason for hyperglycemia, Hayley Case is  also missing out on using correction scale. I have encouraged her to check glucose during the day   MEDICATIONS: - Decreased Toujeo to 32 units once daily - Continue Humalog 12 units with each meal - Continue  Rybelsus 7 mg 1 tablet with breakfast - CF: Humalog (BG -130/30)     EDUCATION / INSTRUCTIONS: BG monitoring instructions: Patient is instructed to check her blood sugars 3 times a day, before meals I reviewed the Rule of 15 for the treatment of hypoglycemia in detail with the patient. Literature supplied.   2) PVD/Dyslipidemia : Managed by cardiology   F/U in 4 months    Signed electronically by: Mack Guise, MD  [facility] Endocrinology  [facility] [address] [place], [place] [address] Phone: [phone] FAX: [phone]   CC: Marrian Salvage, [address] [place] [address] Phone: [phone]  Fax: [phone]  Return to Endocrinology clinic as below: Future Appointments  Date Time Provider [place]  12/25/2020  4:15 PM Artist Pais, PTA [facility]  12/27/2020  8:45 AM Artist Pais, PTA [facility]  [facility]  01/01/2021  2:00 PM Artist Pais, PTA [facility]  01/03/2021 11:00 AM Rennie Natter, PT [facility]  01/10/2021 11:00 AM Artist Pais, PTA [facility]  01/28/2021  3:00 PM Bauert, Nicolasa Ducking, LCSW [facility] None  02/12/2021 10:00 AM Bauert, Nicolasa Ducking, LCSW [facility] None  03/19/2021 10:00 AM Wellington Hampshire, MD [facility]  [facility]

## 2020-12-25 NOTE — Therapy (Signed)
[facility] [address]  [place], [place], [address] Phone: [phone]   Fax:  [phone]  Physical Therapy Treatment  Patient Details  Name: Hayley Case MRN: [id number] Date of Birth: 01/15/1950 Referring Provider (PT): Marrian Salvage, FNP   Encounter Date: 12/25/2020   PT End of Session - 12/25/20 1808     Visit Number 12    Number of Visits 20    Date for PT Re-Evaluation 01/11/21    Authorization Type UHC Medicare    Progress Note Due on Visit 20    PT Start Time 1617    PT Stop Time 1700    PT Time Calculation (min) 43 min    Activity Tolerance Patient tolerated treatment well    Behavior During Therapy [facility] for tasks assessed/performed             Past Medical History:  Diagnosis Date   CKD (chronic kidney disease)    Diabetes mellitus without complication ([place])    Hyperlipidemia    Hypertension     Past Surgical History:  Procedure Laterality Date   BREAST BIOPSY     BREAST EXCISIONAL BIOPSY Left     There were no vitals filed for this visit.   Subjective Assessment - 12/25/20 1620     Subjective Pt feels that she need to do more posture work no LE pain today except some knee soreness.    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 Mild multilevel disc  space height loss and osteophytosis, as well as mild multilevel  facet degenerative change of the lower levels. Lumbar disc and  neural foraminal pathology may be further evaluated by MRI if  indicated by neurologically localizing signs and symptoms.    Patient Stated Goals be more active, be able to sleep on R side without pain, decrease pain    Currently in Pain? Yes    Pain Score 3     Pain Location Knee    Pain Orientation Right    Pain  Descriptors / Indicators Aching    Pain Type Chronic pain                               [facility] Adult PT Treatment/Exercise - 12/25/20 0001       Lumbar Exercises: Standing   Row Strengthening;Both;20 reps;Theraband    Theraband Level (Row) Level 2 (Red)    Shoulder Extension Strengthening;Both;20 reps;Theraband    Theraband Level (Shoulder Extension) Level 2 (Red)    Other Standing Lumbar Exercises B ER with red TB 10x    Other Standing Lumbar Exercises standing marches with unilat arm raises 10x      Lumbar Exercises: Seated   Other Seated Lumbar Exercises seated DL with blue weight ball 2x10    Other Seated Lumbar Exercises OHP with blue weight ball 2x10      Knee/Hip Exercises: Standing   Hip ADduction Strengthening;Both;10 reps    Hip ADduction Limitations abd + ext                       PT Short Term Goals - 10/29/20 1025       PT SHORT TERM  GOAL #1   Title Pt will be independent with initial HEP for core strengthening    Time 2    Period Weeks    Status Achieved   10/29/20- performs daily   Target Date 10/29/20               PT Long Term Goals - 12/14/20 1031       PT LONG TERM GOAL #1   Title Pt. will be independent with progressed HEP to improve outcomes.    Time 6    Period Weeks    Status On-going   10/17- progressed  11/15/2020- met for current     PT LONG TERM GOAL #2   Title Pt. will demonstrate improved functional strength and mobility by improving 5x STS to <20 seconds    Baseline 27 seconds    Time 6    Period Weeks    Status Achieved   11/15/20- 16 seconds     PT LONG TERM GOAL #3   Title Pt. will report 75% improvement in sleep disruption due to pain.    Baseline cannot lay on R side, increased pain 7-8/10 in AM    Time 6    Period Weeks    Status Achieved   10/17- progressing  11/15/20- not being woken up due to pain, able to sleep on R side again.     PT LONG TERM GOAL #4   Title Patient will report  improved tolerance to standing/walking to 15 minutes without increased LBP    Time 6    Period Weeks    Status On-going   10/20- 450' ( ~4 min) walking before R radicular symptoms increased  11/19/20- able to walk 2 x 6 min on treadmill with break for foward flexion to decrease complaint RLE pain.  12/2- 5 minutes.   Target Date 01/11/21                   Plan - 12/25/20 1816     Clinical Impression Statement Pt showed more compensation with rows to avoid dropping arms. Also instruction not to round her back with the seated deadlifts. Worked on posture to improve overall alignment to reduce stress on the spine. We worked on more UE exercises to help strengthen the back and decrease stiffness. 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  Consider DN to R piriformis/glut med and lumbar paraspinals; progress core strengthening    Consulted and Agree with Plan of Care Patient             Patient will benefit from skilled therapeutic intervention in order to improve the following deficits and impairments:  Decreased activity tolerance, Decreased endurance, Decreased range of motion, Decreased strength, Increased fascial restricitons, Improper body mechanics,  Pain, Decreased balance, Decreased mobility, Difficulty walking, Increased muscle spasms, Impaired flexibility, Postural dysfunction  Visit Diagnosis: Chronic midline low back pain with right-sided sciatica  Cramp and spasm  Muscle weakness (generalized)     Problem List Patient  Active Problem List   Diagnosis Date Noted   Flank pain 10/14/2019   Type 2 diabetes mellitus with diabetic polyneuropathy, with long-term current use of insulin ([place]) 12/01/2018   Type 2 diabetes mellitus with retinopathy, with long-term current use of insulin ([place]) 12/01/2018   Type 2 diabetes mellitus with stage 3b chronic kidney disease, with long-term current use of insulin ([place]) 11/30/2018   Non-intractable vomiting    Acute hepatitis 07/13/2018   Acute on chronic renal insufficiency 07/12/2018   Diabetes mellitus ([place]) 12/08/2016   Essential hypertension 12/08/2016   Hyperlipidemia associated with type 2 diabetes mellitus ([place]) 12/08/2016    Artist Pais, PTA 12/25/2020, 6:18 PM  [facility] [address]  [place], [place], [address] Phone: [phone]   Fax:  [phone]  Name: DIEGO DELANCEY MRN: [id number] Date of Birth: [birth year]

## 2020-12-25 NOTE — Patient Instructions (Addendum)
-   Continue Rybelsus 7 mg, 1 tablet before Breakfast  - Decrease Toujeo to 32 units once daily  - Continue  Humalog 12  units with each meal   -Humalog correctional insulin: ADD extra units on insulin to your meal-time Humalog dose if your blood sugars are higher than 160. Use the scale below to help guide you:   Blood sugar before meal Number of units to inject  Less than 160 0 unit  161 -  190 1 units  191 -  220 2 units  221 -  250 3 units  251 -  280 4 units  281 -  310 5 units  311 -  340 6 units  341 -  370 7 units  371 -  400 8 units  401 - 430 9 units        HOW TO TREAT LOW BLOOD SUGARS (Blood sugar LESS THAN 70 MG/DL) Please follow the RULE OF 15 for the treatment of hypoglycemia treatment (when your (blood sugars are less than 70 mg/dL)   STEP 1: Take 15 grams of carbohydrates when your blood sugar is low, which includes:  3-4 GLUCOSE TABS  OR 3-4 OZ OF JUICE OR REGULAR SODA OR ONE TUBE OF GLUCOSE GEL    STEP 2: RECHECK blood sugar in 15 MINUTES STEP 3: If your blood sugar is still low at the 15 minute recheck --> then, go back to STEP 1 and treat AGAIN with another 15 grams of carbohydrates.

## 2020-12-27 ENCOUNTER — Other Ambulatory Visit: Payer: Self-pay

## 2020-12-27 ENCOUNTER — Ambulatory Visit: Payer: Medicare Other

## 2020-12-27 DIAGNOSIS — M6281 Muscle weakness (generalized): Secondary | ICD-10-CM

## 2020-12-27 DIAGNOSIS — R252 Cramp and spasm: Secondary | ICD-10-CM

## 2020-12-27 DIAGNOSIS — M5441 Lumbago with sciatica, right side: Secondary | ICD-10-CM | POA: Diagnosis not present

## 2020-12-27 DIAGNOSIS — G8929 Other chronic pain: Secondary | ICD-10-CM

## 2020-12-27 NOTE — Therapy (Signed)
[facility] [address]  [place], [place], [address] Phone: [phone]   Fax:  [phone]  Physical Therapy Treatment  Patient Details  Name: Hayley Case MRN: [id number] Date of Birth: September 26, 1950 Referring Provider (PT): Marrian Salvage, FNP   Encounter Date: 12/27/2020   PT End of Session - 12/27/20 0938     Visit Number 13    Number of Visits 20    Date for PT Re-Evaluation 01/11/21    Authorization Type UHC Medicare    Progress Note Due on Visit 19    PT Start Time 0849    PT Stop Time 0933    PT Time Calculation (min) 44 min    Activity Tolerance Patient tolerated treatment well    Behavior During Therapy [facility] for tasks assessed/performed             Past Medical History:  Diagnosis Date   CKD (chronic kidney disease)    Diabetes mellitus without complication ([place])    Hyperlipidemia    Hypertension     Past Surgical History:  Procedure Laterality Date   BREAST BIOPSY     BREAST EXCISIONAL BIOPSY Left     There were no vitals filed for this visit.   Subjective Assessment - 12/27/20 0853     Subjective Pt reports that she felt good after the last session, did not have any soreness afterwards.    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 Mild multilevel disc  space height loss and osteophytosis, as well as mild multilevel  facet degenerative change of the lower levels. Lumbar disc and  neural foraminal pathology may be further evaluated by MRI if  indicated by neurologically localizing signs and symptoms.    Patient Stated Goals be more active, be able to sleep on R side without pain, decrease pain    Currently in Pain? No/denies                               [facility] Adult PT  Treatment/Exercise - 12/27/20 0001       Lumbar Exercises: Aerobic   Tread Mill 78mh, incline on 1, 6 min    Nustep L5x61m      Lumbar Exercises: Machines for Strengthening   Other Lumbar Machine Exercise standing (10lb 2x10) and sitting lat pulls (20lb 10 reps)    Other Lumbar Machine Exercise cybex row machine 25lb 2x10      Lumbar Exercises: Standing   Functional Squats 10 reps;2 seconds    Functional Squats Limitations counter support      Lumbar Exercises: Seated   Sit to Stand 5 reps    Sit to Stand Limitations STS with OHP using blue weight ball    Other Seated Lumbar Exercises ER with red TB 10x each      Knee/Hip Exercises: Standing   Hip ADduction Strengthening;Both;10 reps    Hip ADduction Limitations abd + ext                       PT Short Term Goals - 10/29/20 1025       PT SHORT TERM GOAL #1   Title Pt will be independent with  initial HEP for core strengthening    Time 2    Period Weeks    Status Achieved   10/29/20- performs daily   Target Date 10/29/20               PT Long Term Goals - 12/27/20 0923       PT LONG TERM GOAL #1   Title Pt. will be independent with progressed HEP to improve outcomes.    Time 6    Period Weeks    Status On-going   10/17- progressed  11/15/2020- met for current     PT LONG TERM GOAL #2   Title Pt. will demonstrate improved functional strength and mobility by improving 5x STS to <20 seconds    Baseline 27 seconds    Time 6    Period Weeks    Status Achieved   11/15/20- 16 seconds     PT LONG TERM GOAL #3   Title Pt. will report 75% improvement in sleep disruption due to pain.    Baseline cannot lay on R side, increased pain 7-8/10 in AM    Time 6    Period Weeks    Status Achieved   10/17- progressing  11/15/20- not being woken up due to pain, able to sleep on R side again.     PT LONG TERM GOAL #4   Title Patient will report improved tolerance to standing/walking to 15 minutes without  increased LBP    Time 6    Period Weeks    Status On-going   12/15- pt able to stand for 10 min but supported, able to walk 6 min during session   Target Date 01/11/21                   Plan - 12/27/20 0947     Clinical Impression Statement Pt has improved with her standing tolerance, as stated during session she stood for ~10 min this morning but supported. At times she required reminders to keep an upright posture, especially while walking on the treadmill. Cues still needed with rows to avoid swaying her trunk. She had a good tolerance for the exercises today and making progress toward goals.    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  Consider DN to R piriformis/glut med and lumbar paraspinals; progress core strengthening    Consulted and Agree with Plan of Care Patient             Patient will benefit from skilled therapeutic intervention in order to improve the following deficits and impairments:  Decreased activity tolerance, Decreased endurance, Decreased range of motion, Decreased strength, Increased fascial restricitons, Improper body mechanics, Pain, Decreased balance, Decreased mobility, Difficulty walking, Increased muscle spasms, Impaired flexibility, Postural dysfunction  Visit  Diagnosis: Chronic midline low back pain with right-sided sciatica  Cramp and spasm  Muscle weakness (generalized)     Problem List Patient Active Problem List   Diagnosis Date Noted   Flank pain 10/14/2019   Type 2 diabetes mellitus with diabetic polyneuropathy, with long-term current  use of insulin ([place]) 12/01/2018   Type 2 diabetes mellitus with retinopathy, with long-term current use of insulin ([place]) 12/01/2018   Type 2 diabetes mellitus with stage 3b chronic kidney disease, with long-term current use of insulin ([place]) 11/30/2018   Non-intractable vomiting    Acute hepatitis 07/13/2018   Acute on chronic renal insufficiency 07/12/2018   Diabetes mellitus ([place]) 12/08/2016   Essential hypertension 12/08/2016   Hyperlipidemia associated with type 2 diabetes mellitus ([place]) 12/08/2016    Artist Pais, PTA 12/27/2020, 9:50 AM  [facility] [address]  [place], [place], [address] Phone: [phone]   Fax:  [phone]  Name: Hayley Case MRN: [id number] Date of Birth: 1950-03-17

## 2021-01-01 ENCOUNTER — Ambulatory Visit: Payer: Medicare Other

## 2021-01-03 ENCOUNTER — Other Ambulatory Visit: Payer: Self-pay

## 2021-01-03 ENCOUNTER — Encounter: Payer: Self-pay | Admitting: Physical Therapy

## 2021-01-03 ENCOUNTER — Ambulatory Visit: Payer: Medicare Other | Admitting: Physical Therapy

## 2021-01-03 DIAGNOSIS — G8929 Other chronic pain: Secondary | ICD-10-CM

## 2021-01-03 DIAGNOSIS — M6281 Muscle weakness (generalized): Secondary | ICD-10-CM

## 2021-01-03 DIAGNOSIS — R252 Cramp and spasm: Secondary | ICD-10-CM

## 2021-01-03 DIAGNOSIS — M5441 Lumbago with sciatica, right side: Secondary | ICD-10-CM | POA: Diagnosis not present

## 2021-01-03 NOTE — Therapy (Addendum)
PHYSICAL THERAPY DISCHARGE SUMMARY  Visits from Start of Care: 14   Current functional level related to goals / functional outcomes: Patient reports significant improvement in leg pain and cramping since stopping the statin.  She is able to do all activities at home now without restriction from pain, and reports that she never notices leg pain with shopping.  She does occasionally get thigh cramps but this has also improved since stopping her statin.   Due to progress, placed on 30 day hold on 01/03/2021, and has not returned during that time frame.     Remaining deficits: Increased LE pain with prolonged walking    Education / Equipment: HE  Plan: Patient agrees to discharge.   Patient is being discharged due to meeting the stated rehab goals.     Rennie Natter, PT, DPT 02/11/2021  [facility] [address]  [place], [place], [address] Phone: [phone]   Fax:  [phone]  Physical Therapy Treatment  Patient Details  Name: Hayley Case MRN: [id number] Date of Birth: 11/23/1950 Referring Provider (PT): Marrian Salvage, FNP   Encounter Date: 01/03/2021   PT End of Session - 01/03/21 1109     Visit Number 14    Number of Visits 20    Date for PT Re-Evaluation 01/11/21    Authorization Type UHC Medicare    Progress Note Due on Visit 20    PT Start Time 1107    PT Stop Time 1145    PT Time Calculation (min) 38 min    Activity Tolerance Patient tolerated treatment well    Behavior During Therapy [facility] for tasks assessed/performed             Past Medical History:  Diagnosis Date   CKD (chronic kidney disease)    Diabetes mellitus without complication (Hayley Case)    Hyperlipidemia    Hypertension     Past Surgical History:  Procedure Laterality Date   BREAST BIOPSY     BREAST EXCISIONAL BIOPSY Left     There were no vitals filed for this visit.   Subjective Assessment - 01/03/21 1108      Subjective Patient thinks she's been sleeping wrong, shoulder hurting a little, but back ok.  Some pain in knee from arthritis.  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 Mild multilevel disc  space height loss and osteophytosis, as well as mild multilevel  facet degenerative change of the lower levels. Lumbar disc and  neural foraminal pathology may be further evaluated by MRI if  indicated by neurologically localizing signs and symptoms.    Patient Stated Goals be more active, be able to sleep on R side without pain, decrease pain    Currently in Pain? No/denies                               [facility] Adult PT Treatment/Exercise - 01/03/21 0001       Lumbar Exercises: Stretches   Other Lumbar Stretch Exercise seated hip flexor  stretch R x 30 sec      Lumbar Exercises: Aerobic   Tread Mill 41mh, incline 1%, x 8 min    Nustep L5 x 10 min    Other Aerobic Exercise gait x 5 min self selected pace, cues for posture                       PT Short Term Goals - 10/29/20 1025       PT SHORT TERM GOAL #1   Title Pt will be independent with initial HEP for core strengthening    Time 2    Period Weeks    Status Achieved   10/29/20- performs daily   Target Date 10/29/20               PT Long Term Goals - 01/03/21 1111       PT LONG TERM GOAL #1   Title Pt. will be independent with progressed HEP to improve outcomes.    Time 6    Period Weeks    Status Achieved   10/17- progressed  11/15/2020- met for current  01/03/21- good compliance     PT LONG TERM GOAL #2   Title Pt. will demonstrate improved functional strength  and mobility by improving 5x STS to <20 seconds    Baseline 27 seconds    Time 6    Period Weeks    Status Achieved   11/15/20- 16 seconds     PT LONG TERM GOAL #3   Title Pt. will report 75% improvement in sleep disruption due to pain.    Baseline cannot lay on R side, increased pain 7-8/10 in AM    Time 6    Period Weeks    Status Achieved   10/17- progressing  11/15/20- not being woken up due to pain, able to sleep on R side again.     PT LONG TERM GOAL #4   Title Patient will report improved tolerance to standing/walking to 15 minutes without increased LBP    Time 6    Period Weeks    Status Partially Met   12/15- pt able to stand for 10 min but supported, able to walk 6 min during session  01/03/21- able to walk on treadmill for 8 min, reports R hip flexor pain but no back pain.   Target Date 01/11/21                   Plan - 01/03/21 1110     Clinical Impression Statement Patient reports significant improvement in leg pain and cramping since stopping the statin.  She is able to do all activities at home now without restriction from pain, and reports that she never notices leg pain with shopping.  She does occasionally get thigh cramps but this has also improved since stopping her statin.  today focused on more endurance/aerobic exercise, she did report increased R hip flexor pain with walking on treadmill with incline, but returned to 0 after short seated rest break.  With walking on level ground she reported much less discomfort.  Educated throughout session on diet choices to improve heart health, including substitutions for pasta since she reports emotional eating, and maintaining activity level.  Due to progress, placed on 30 day hold today, to continue to perform HEP at home.    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  Consider DN to R piriformis/glut med and lumbar paraspinals; progress core strengthening    Consulted and Agree with Plan of Care Patient             Patient will benefit from skilled therapeutic intervention in order to improve the following deficits and impairments:  Decreased activity tolerance, Decreased endurance, Decreased range of motion, Decreased strength, Increased fascial restricitons, Improper body mechanics, Pain, Decreased balance, Decreased mobility, Difficulty walking, Increased muscle spasms, Impaired flexibility, Postural dysfunction  Visit Diagnosis: Chronic midline low back pain with right-sided sciatica  Cramp and spasm  Muscle weakness (generalized)     Problem List Patient Active Problem List   Diagnosis Date Noted   Flank pain 10/14/2019   Type 2 diabetes mellitus with diabetic polyneuropathy, with long-term current use of insulin ([place] 12/01/2018   Type 2 diabetes mellitus with retinopathy, with long-term current use of insulin ([place] 12/01/2018   Type 2 diabetes mellitus with stage 3b chronic kidney disease, with long-term current use of insulin ([place] 11/30/2018   Non-intractable vomiting    Acute hepatitis 07/13/2018   Acute on chronic renal insufficiency 07/12/2018   Diabetes mellitus ([place] 12/08/2016   Essential hypertension 12/08/2016   Hyperlipidemia associated with type 2 diabetes mellitus ([place] 12/08/2016     ERennie Natter PT, DPT  01/03/2021, 12:03 PM  [facility] [address] [place] [address]: [phone]  Fax:  [phone] Name: Hayley PROTHEROMRN: [id number] of Birth: 324-Apr-1952

## 2021-01-10 ENCOUNTER — Other Ambulatory Visit: Payer: Self-pay | Admitting: Internal Medicine

## 2021-01-28 ENCOUNTER — Ambulatory Visit: Payer: Medicare Other | Admitting: Psychology

## 2021-02-12 ENCOUNTER — Ambulatory Visit (INDEPENDENT_AMBULATORY_CARE_PROVIDER_SITE_OTHER): Payer: Medicare HMO | Admitting: Psychology

## 2021-02-12 DIAGNOSIS — F4323 Adjustment disorder with mixed anxiety and depressed mood: Secondary | ICD-10-CM

## 2021-02-12 NOTE — Progress Notes (Signed)
[facility] Counselor/Therapist Progress Note  Patient ID: Hayley Case, MRN: [id number],    Date: 02/12/2021  Time Spent: 10:00am-11:00am   60 minutes   Treatment Type: Individual Therapy  Reported Symptoms: stress and overwhelm  Mental Status Exam: Appearance:  Casual     Behavior: Appropriate  Motor: Normal  Speech/Language:  Normal Rate  Affect: Appropriate  Mood: normal  Thought process: normal  Thought content:   WNL  Sensory/Perceptual disturbances:   WNL  Orientation: oriented to person, place, time/date, and situation  Attention: Good  Concentration: Good  Memory: WNL  Fund of knowledge:  Good  Insight:   Good  Judgment:  Good  Impulse Control: Good   Risk Assessment: Danger to Self:  No Self-injurious Behavior: No Danger to Others: No Duty to Warn:no Physical Aggression / Violence:No  Access to Firearms a concern: No  Gang Involvement:No   Subjective: Pt present for face-to-face individual therapy via video Webex.  Pt consents to telehealth video session due to COVID 19 pandemic. Location of pt: home. Location of therapist: home office.  Pt talked about caregiving for her son.  Pt's son has had some complication regarding his dialysis.  Pt's son has been throwing up and having issues.  Pt tends to worry about him.   Worked on managing worry thoughts.  Pt use to have a nurse tech help her but she stopped coming.   Addressed the stress of caregiving.  Worked on Child psychotherapist.  Pt talked about conversations with her daughter in [place].   She wants pt and son to move back there bc there are better resources.   Pt is thinking about doing some volunteer work with children.   Pt talked about her health.  She has arthritis and is in pain at times.   Pt has finished PT but misses it bc she misses the social contact.  Addressed other ways pt can get social connection.  She does not drive so it is challenging.  Pt connects with her sisters by texting  and once a month they have a family zoom call.   Pt talked about feeling overwhelmed with the caregiving for her son.  At times she feels lonely when he is at work.   Provided supportive therapy.     Interventions: Cognitive Behavioral Therapy and Insight-Oriented  Diagnosis: F43.23  Plan: See pt's Treatment Plan for depression and anxiety in Therapy Charts.  (Treatment Plan Target Date: 11/05/2021) Pt is progressing toward treatment goals.   Plan to continue to see pt monthly.    Hayley Hauschild, LCSW                  Hayley Mourer Clara, LCSW

## 2021-02-25 ENCOUNTER — Telehealth: Payer: Self-pay

## 2021-02-25 ENCOUNTER — Telehealth: Payer: Self-pay | Admitting: Cardiovascular Disease

## 2021-02-25 ENCOUNTER — Other Ambulatory Visit: Payer: Self-pay

## 2021-02-25 MED ORDER — EZETIMIBE 10 MG PO TABS: 10.0000 mg | ORAL_TABLET | Freq: Every day | ORAL | 1 refills | Status: DC

## 2021-02-25 NOTE — Telephone Encounter (Signed)
Attempted to call patient regarding medication refill sent to pharmacy unable to contact.

## 2021-02-25 NOTE — Telephone Encounter (Signed)
°*  STAT* If patient is at the pharmacy, call can be transferred to refill team.   1. Which medications need to be refilled? (please list name of each medication and dose if known) ezetimibe (ZETIA) 10 MG tablet  2. Which pharmacy/location (including street and city if local pharmacy) is medication to be sent to? [id number] - [place], [place] - [address] AT [address]  3. Do they need a 30 day or 90 day supply? 90 day   Patient is out of medication

## 2021-02-26 ENCOUNTER — Ambulatory Visit (INDEPENDENT_AMBULATORY_CARE_PROVIDER_SITE_OTHER): Payer: Medicare HMO | Admitting: Psychology

## 2021-02-26 DIAGNOSIS — F4323 Adjustment disorder with mixed anxiety and depressed mood: Secondary | ICD-10-CM | POA: Diagnosis not present

## 2021-02-26 NOTE — Progress Notes (Signed)
[facility] Counselor/Therapist Progress Note  Patient ID: SHALEAH NISSLEY, MRN: [id number],    Date: 02/26/2021  Time Spent: 10:00am-11:00am   60 minutes   Treatment Type: Individual Therapy  Reported Symptoms: stress and overwhelm  Mental Status Exam: Appearance:  Casual     Behavior: Appropriate  Motor: Normal  Speech/Language:  Normal Rate  Affect: Appropriate  Mood: normal  Thought process: normal  Thought content:   WNL  Sensory/Perceptual disturbances:   WNL  Orientation: oriented to person, place, time/date, and situation  Attention: Good  Concentration: Good  Memory: WNL  Fund of knowledge:  Good  Insight:   Good  Judgment:  Good  Impulse Control: Good   Risk Assessment: Danger to Self:  No Self-injurious Behavior: No Danger to Others: No Duty to Warn:no Physical Aggression / Violence:No  Access to Firearms a concern: No  Gang Involvement:No   Subjective: Pt present for face-to-face individual therapy via video Webex.  Pt consents to telehealth video session due to COVID 19 pandemic. Location of pt: home. Location of therapist: home office.  Pt talked about caregiving for her son.  Pt tends to worry about him.   Worked on managing worry thoughts.  Pt use to have a nurse tech help her but she stopped coming.   Addressed the stress of caregiving.  Worked on Child psychotherapist.  Pt talked about trying to use [employer] for transportation.   Pt had some trouble using the app to get home and had trouble and she was anxious and scared.  She called her son who helped her navigate the app and got her a ride.  He stayed on the phone with pt since she felt anxious.   Pt talked about her health.  She needs to go to the hearing doctor to get hearing aids.   Pt talked about feeling overwhelmed with the caregiving for her son.  At times she feels lonely when he is at work.  Addressed how pt an increase social connections.   Pt talked about she and her son looking for  a house to rent to buy since their lease is up in October.  Pt plans to stay with her son until he gets a kidney transplant and then she hopes to be able to move back to [place].   Pt talked about her family.   A sister lost her son and found out about his death on Facebook which was very upsetting.  Pt's nephew had to have a toe amputated bc of diabetes.  Addressed pt's concerns about her family.   Pt is planning a trip to see family in [place] once the weather gets warm.  Pt talked about needing the respite from caregiving.   Provided supportive therapy.     Interventions: Cognitive Behavioral Therapy and Insight-Oriented  Diagnosis: F43.23  Plan: See pt's Treatment Plan for depression and anxiety in Therapy Charts.  (Treatment Plan Target Date: 11/05/2021) Pt is progressing toward treatment goals.   Plan to continue to see pt monthly.    Yavuz Kirby, LCSW

## 2021-02-27 MED ORDER — EZETIMIBE 10 MG PO TABS: 10.0000 mg | ORAL_TABLET | Freq: Every day | ORAL | 1 refills | Status: DC

## 2021-02-27 NOTE — Telephone Encounter (Signed)
Patient is calling in regards to this prescription states she ran out of the medication early due to the previous prescription advising to take a 1/2 a tablet daily instead of a whole tablet like she takes it. [facility] has not received medication. Please advise.

## 2021-02-27 NOTE — Telephone Encounter (Signed)
Refill sent to pharmacy.   

## 2021-03-11 ENCOUNTER — Telehealth: Payer: Self-pay | Admitting: Licensed Clinical Social Worker

## 2021-03-11 NOTE — Telephone Encounter (Signed)
LCSW received update that pt had called Amy, Health Coach's phone. I called pt back at [phone]. Reintroduced self and role. Pt shares that her retirement plan changed from [facility] to [facility]. She is unsure if she still has transportation benefits with that plan. LCSW shared how pt could contact member services and shared that each plan may have different specifics about if they have rides, how many rides they have per calendar year etc. Pt shares that she has an upcoming appt w/ Dr. Fletcher Anon on 3/7. LCSW walked pt through how to locate number on card, what to ask, and encouraged her to do so today and let me know if she runs into any issues. Pt appreciative of guidance and understands I will mail additional options. Remain available, pt took down my number. Pt also shares that she called office to schedule an appt with Dr. Audie Box (had received letter telling her it was time) but was unable to get through, I will route this to Dr. Heather Roberts nurse to see if team is able to help further with this.   Westley Hummer, MSW, [place]  [phone]- work cell phone (preferred) [phone]- desk phone

## 2021-03-12 NOTE — Telephone Encounter (Signed)
Pt responded to let me know that through her benefits 24 rides a year are covered with unlimited mileage. Pt unsure if 24 will be enough for the year. Encouraged her to let me know if she runs into any issues with getting to our office/if rides run low. Pt has been mailed additional options to consider as well.   Westley Hummer, MSW, [place]  [phone]- work cell phone (preferred) [phone]- desk phone

## 2021-03-19 ENCOUNTER — Ambulatory Visit (INDEPENDENT_AMBULATORY_CARE_PROVIDER_SITE_OTHER): Payer: Medicare HMO | Admitting: Cardiovascular Disease

## 2021-03-19 ENCOUNTER — Encounter: Payer: Self-pay | Admitting: Cardiovascular Disease

## 2021-03-19 ENCOUNTER — Other Ambulatory Visit: Payer: Self-pay

## 2021-03-19 VITALS — BP 160/90 | HR 96 | Ht 64.0 in | Wt 202.4 lb

## 2021-03-19 DIAGNOSIS — E785 Hyperlipidemia, unspecified: Secondary | ICD-10-CM

## 2021-03-19 DIAGNOSIS — I779 Disorder of arteries and arterioles, unspecified: Secondary | ICD-10-CM

## 2021-03-19 DIAGNOSIS — I739 Peripheral vascular disease, unspecified: Secondary | ICD-10-CM | POA: Diagnosis not present

## 2021-03-19 DIAGNOSIS — I10 Essential (primary) hypertension: Secondary | ICD-10-CM

## 2021-03-19 NOTE — Patient Instructions (Signed)
Medication Instructions:  ?The current medical regimen is effective;  continue present plan and medications. ? ?*If you need a refill on your cardiac medications before your next appointment, please call your pharmacy* ? ? ?Testing/Procedures: ?Your physician has requested that you have a carotid duplex (in April). This test is an ultrasound of the carotid arteries in your neck. It looks at blood flow through these arteries that supply the brain with blood. Allow one hour for this exam. There are no restrictions or special instructions.  ? ? ?Follow-Up: ?At [facility], you and your health needs are our priority.  As part of our continuing mission to provide you with exceptional heart care, we have created designated Provider Care Teams.  These Care Teams include your primary Cardiologist (physician) and Advanced Practice Providers (APPs -  Physician Assistants and Nurse Practitioners) who all work together to provide you with the care you need, when you need it. ? ?We recommend signing up for the patient portal called "MyChart".  Sign up information is provided on this After Visit Summary.  MyChart is used to connect with patients for Virtual Visits (Telemedicine).  Patients are able to view lab/test results, encounter notes, upcoming appointments, etc.  Non-urgent messages can be sent to your provider as well.   ?To learn more about what you can do with MyChart, go to [email or web address].   ? ?Your next appointment:   ?12 month(s) ? ?The format for your next appointment:   ?In Person ? ?Provider:   ?Kathlyn Sacramento, MD  ? ? ?

## 2021-03-19 NOTE — Progress Notes (Signed)
?  ?Cardiology Office Note ? ? ?Date:  03/19/2021  ? ?ID:  Hayley Case, DOB [birth year], MRN [id number] ? ?PCP:  Marrian Salvage, [place]  ?Cardiologist: Dr. Audie Box ? ?No chief complaint on file. ? ? ? ?  ?History of Present Illness: ?Hayley Case is a 71 y.o. female who is here today for follow-up visit regarding peripheral arterial disease.   ?She has known history of diabetes mellitus, hyperlipidemia, hypertension, chronic kidney disease and carotid artery disease. ?Cardiac testing in 2020 was unremarkable including an echocardiogram and a Lexiscan Myoview. ? ?She is followed for right calf claudication with suspected right SFA/popliteal disease or occlusion.  ABI was normal on the left side and moderately reduced on the right and 0.59.  She did not tolerate cilostazol due to GI symptoms. ?During last visit, she complained of severe right leg pain starting in the groin area that was not felt to be due to peripheral arterial disease.  She underwent MRI of the lumbar spine which showed spondylosis but no acute findings.  She reports that her symptoms improved with physical therapy.  She has right knee joint pain.  Right calf claudication continues to be mild. ? ? ? ?Past Medical History:  ?Diagnosis Date  ? CKD (chronic kidney disease)   ? Diabetes mellitus without complication ([place])   ? Hyperlipidemia   ? Hypertension   ? ? ?Past Surgical History:  ?Procedure Laterality Date  ? BREAST BIOPSY    ? BREAST EXCISIONAL BIOPSY Left   ? ? ? ?Current Outpatient Medications  ?Medication Sig Dispense Refill  ? acetaminophen (TYLENOL) 500 MG tablet Take 500 mg by mouth every 4 (four) hours as needed for headache (pain). 625mg     ? albuterol (VENTOLIN HFA) 108 (90 Base) MCG/ACT inhaler Inhale 2 puffs into the lungs every 6 (six) hours as needed for wheezing or shortness of breath. 1 g 3  ? amLODipine (NORVASC) 10 MG tablet TAKE 1 TABLET BY MOUTH DAILY 90 tablet 3  ? Carboxymethylcellulose Sodium (REFRESH TEARS OP)  Place 1 drop into both eyes at bedtime.     ? cholecalciferol (VITAMIN D3) 25 MCG (1000 UNIT) tablet Take 1,000 Units by mouth daily. Takes 2 tablets so 2,000    ? Continuous Blood Gluc Sensor (FREESTYLE LIBRE 2 SENSOR) MISC Inject 1 Device into the skin as directed. Use as directed every 14 days. E11.21 12 each 3  ? Docusate Sodium (COLACE PO) Take by mouth as needed.    ? ezetimibe (ZETIA) 10 MG tablet Take 1 tablet (10 mg total) by mouth daily. 90 tablet 1  ? insulin glargine, 2 Unit Dial, (TOUJEO MAX SOLOSTAR) 300 UNIT/ML Solostar Pen Inject 36 Units into the skin daily. 15 mL 6  ? insulin lispro (HUMALOG KWIKPEN) 200 UNIT/ML KwikPen Inject 12 Units into the skin with breakfast, with lunch, and with evening meal. Max daily 80 units with correction scale 45 mL 3  ? Insulin Pen Needle (PEN NEEDLES) 32G X 4 MM MISC 1 Device by Does not apply route in the morning, at noon, in the evening, and at bedtime. 400 each 3  ? loratadine (CLARITIN) 10 MG tablet Take 10 mg by mouth daily as needed (seasonal allergies).    ? losartan (COZAAR) 50 MG tablet Take 50 mg by mouth daily.    ? Misc. Devices MISC Please provide patient with insurance approved blood pressure monitor. 1 each 0  ? RYBELSUS 7 MG TABS TAKE 1 TABLET BY MOUTH DAILY AT 2  PM 90 tablet 3  ? vitamin B-12 (CYANOCOBALAMIN) 1000 MCG tablet Take 1,000 mcg by mouth daily.    ? ?No current facility-administered medications for this visit.  ? ? ?Allergies:   Lisinopril, Lipitor [atorvastatin], Penicillins, and Septra [sulfamethoxazole-trimethoprim]  ? ? ?Social History:  The patient  reports that she has never smoked. She has never used smokeless tobacco. She reports that she does not drink alcohol and does not use drugs.  ? ?Family History:  The patient's family history includes Colon cancer in her sister; Heart attack in her father; Skin cancer in her mother.  ? ? ?ROS:  Please see the history of present illness.   Otherwise, review of systems are positive for none.    All other systems are reviewed and negative.  ? ? ?PHYSICAL EXAM: ?VS:  BP (!) 160/90 (BP Location: Right Arm, Patient Position: Sitting)   Pulse 96   Ht 5\' 4"  (1.626 m)   Wt 202 lb 6.4 oz (91.8 kg)   SpO2 98%   BMI 34.74 kg/m?  , BMI Body mass index is 34.74 kg/m?. ?GEN: Well nourished, well developed, in no acute distress  ?HEENT: normal  ?Neck: no JVD,  or masses.  Bilateral carotid bruits ?Cardiac: RRR; no rubs, or gallops,no edema .  2 out of 6 systolic murmur in the aortic area ?Respiratory:  clear to auscultation bilaterally, normal work of breathing ?GI: soft, nontender, nondistended, + BS ?MS: no deformity or atrophy  ?Skin: warm and dry, no rash ?Neuro:  Strength and sensation are intact ?Psych: euthymic mood, full affect ? ? ? ?EKG:  EKG is  ordered today. ?EKG showed normal sinus rhythm with no significant ST or T wave changes. ? ? ?Recent Labs: ?09/18/2020: ALT 13; BUN 31; Creatinine, Ser 1.98; Hemoglobin 11.7; Platelets 329.0; Potassium 4.2; Sodium 141; TSH 2.63  ? ? ?Lipid Panel ?   ?Component Value Date/Time  ? CHOL 114 05/06/2019 1222  ? TRIG 145 05/06/2019 1222  ? HDL 35 (L) 05/06/2019 1222  ? CHOLHDL 3.3 05/06/2019 1222  ? LDLCALC 54 05/06/2019 1222  ? ?  ? ?Wt Readings from Last 3 Encounters:  ?03/19/21 202 lb 6.4 oz (91.8 kg)  ?12/25/20 205 lb (93 kg)  ?10/19/20 207 lb 3.2 oz (94 kg)  ?  ? ? ? ?No flowsheet data found. ? ? ? ?ASSESSMENT AND PLAN: ? ?1.  Peripheral arterial disease with  right calf claudication .  Her symptoms are overall mild and stable.  She does have significant arthritis pain.  Continue medical therapy for peripheral arterial disease.   ? ?2.  Essential hypertension: Blood pressure is elevated today but she was under stress and also did not take her morning antihypertensive medications.  Her blood pressure is usually controlled. ? ?3.  Hyperlipidemia: She is no longer on rosuvastatin due to myalgia.  She is currently on ezetimibe.  She also has intolerance to  atorvastatin.  Recommended target LDL of less than 70.  If that is not achievable with ezetimibe, consider a PCSK9 inhibitor. ? ?4.  Chronic kidney disease: Stable overall. ? ?5.  Carotid disease: Moderate left carotid stenosis.  She is due for a follow-up carotid Doppler to be done in April which was requested. ? ? ? ?Disposition:   FU with me in 12 months ? ?Signed, ? ?Kathlyn Sacramento, MD  ?03/19/2021 10:13 AM    ?[place] ?

## 2021-03-25 ENCOUNTER — Ambulatory Visit (INDEPENDENT_AMBULATORY_CARE_PROVIDER_SITE_OTHER): Payer: Medicare HMO | Admitting: Psychology

## 2021-03-25 DIAGNOSIS — F4323 Adjustment disorder with mixed anxiety and depressed mood: Secondary | ICD-10-CM

## 2021-03-25 NOTE — Progress Notes (Signed)
[facility] Counselor/Therapist Progress Note ? ?Patient ID: Hayley Case, MRN: [id number],   ? ?Date: 03/25/2021 ? ?Time Spent: 10:00am-11:00am   60 minutes  ? ?Treatment Type: Individual Therapy ? ?Reported Symptoms: stress and overwhelm ? ?Mental Status Exam: ?Appearance:  Casual     ?Behavior: Appropriate  ?Motor: Normal  ?Speech/Language:  Normal Rate  ?Affect: Appropriate  ?Mood: normal  ?Thought process: normal  ?Thought content:   WNL  ?Sensory/Perceptual disturbances:   WNL  ?Orientation: oriented to person, place, time/date, and situation  ?Attention: Good  ?Concentration: Good  ?Memory: WNL  ?Fund of knowledge:  Good  ?Insight:   Good  ?Judgment:  Good  ?Impulse Control: Good  ? ?Risk Assessment: ?Danger to Self:  No ?Self-injurious Behavior: No ?Danger to Others: No ?Duty to Warn:no ?Physical Aggression / Violence:No  ?Access to Firearms a concern: No  ?Gang Involvement:No  ? ?Subjective: Pt present for face-to-face individual therapy via video Webex.  Pt consents to telehealth video session due to COVID 19 pandemic. ?Location of pt: home. ?Location of therapist: home office.  ?Pt talked about her and her son trying to find a house to buy.  Pt is feeling overwhelmed with the process of buying a home.  Pt feels like it is all on her shoulders.  She had to get approved the loan based on her income and in her name bc her credit is better than her son's.  Addressed pt's overwhelm.   Worked on Child psychotherapist.   ?Pt talked about her health.  She has a clogged artery in her neck and she may need surgery.  Addressed pt's health concerns.   ?Pt talked about caregiving for her son.  He has not been feeling well.   Pt tends to worry about him.   Worked on managing worry thoughts.   Addressed the stress of caregiving.  Worked on Child psychotherapist.  ?Pt talked about her husband's nephew passing away recently.  He was 41 years old.  Helped pt process her feelings and grief.   ?Provided supportive  therapy.    ? ?Interventions: Cognitive Behavioral Therapy and Insight-Oriented ? ?Diagnosis: F43.23 ? ?Plan: See pt's Treatment Plan for depression and anxiety in Therapy Charts.  (Treatment Plan Target Date: 11/05/2021) ?Pt is progressing toward treatment goals.   ?Plan to continue to see pt monthly.   ? ?Carneshia Raker, LCSW ? ? ?

## 2021-04-18 ENCOUNTER — Telehealth: Payer: Self-pay

## 2021-04-18 ENCOUNTER — Ambulatory Visit (INDEPENDENT_AMBULATORY_CARE_PROVIDER_SITE_OTHER): Payer: Medicare HMO

## 2021-04-18 VITALS — Ht 64.0 in | Wt 202.0 lb

## 2021-04-18 DIAGNOSIS — Z0000 Encounter for general adult medical examination without abnormal findings: Secondary | ICD-10-CM

## 2021-04-18 DIAGNOSIS — E1122 Type 2 diabetes mellitus with diabetic chronic kidney disease: Secondary | ICD-10-CM | POA: Diagnosis not present

## 2021-04-18 DIAGNOSIS — G4733 Obstructive sleep apnea (adult) (pediatric): Secondary | ICD-10-CM | POA: Insufficient documentation

## 2021-04-18 DIAGNOSIS — E785 Hyperlipidemia, unspecified: Secondary | ICD-10-CM

## 2021-04-18 DIAGNOSIS — E1165 Type 2 diabetes mellitus with hyperglycemia: Secondary | ICD-10-CM | POA: Insufficient documentation

## 2021-04-18 DIAGNOSIS — I10 Essential (primary) hypertension: Secondary | ICD-10-CM

## 2021-04-18 DIAGNOSIS — M109 Gout, unspecified: Secondary | ICD-10-CM | POA: Insufficient documentation

## 2021-04-18 DIAGNOSIS — M1610 Unilateral primary osteoarthritis, unspecified hip: Secondary | ICD-10-CM | POA: Insufficient documentation

## 2021-04-18 DIAGNOSIS — I739 Peripheral vascular disease, unspecified: Secondary | ICD-10-CM | POA: Insufficient documentation

## 2021-04-18 DIAGNOSIS — Z800 Family history of malignant neoplasm of digestive organs: Secondary | ICD-10-CM | POA: Insufficient documentation

## 2021-04-18 DIAGNOSIS — Z794 Long term (current) use of insulin: Secondary | ICD-10-CM | POA: Insufficient documentation

## 2021-04-18 DIAGNOSIS — N1832 Chronic kidney disease, stage 3b: Secondary | ICD-10-CM

## 2021-04-18 DIAGNOSIS — E113299 Type 2 diabetes mellitus with mild nonproliferative diabetic retinopathy without macular edema, unspecified eye: Secondary | ICD-10-CM | POA: Insufficient documentation

## 2021-04-18 DIAGNOSIS — Z860100 Personal history of colon polyps, unspecified: Secondary | ICD-10-CM | POA: Insufficient documentation

## 2021-04-18 DIAGNOSIS — Z5941 Food insecurity: Secondary | ICD-10-CM

## 2021-04-18 DIAGNOSIS — H9190 Unspecified hearing loss, unspecified ear: Secondary | ICD-10-CM | POA: Insufficient documentation

## 2021-04-18 NOTE — Progress Notes (Signed)
? ?Subjective:  ? Hayley Case is a 71 y.o. female who presents for an Initial Medicare Annual Wellness Visit. ?Virtual Visit via Telephone Note ? ?I connected with  Hayley Case on 04/18/21 at 10:00 AM EDT by telephone and verified that I am speaking with the correct person using two identifiers. ? ?Location: ?Patient: HOME ?Provider: [facility] ?Persons participating in the virtual visit: patient/Nurse Health Advisor ?  ?I discussed the limitations, risks, security and privacy concerns of performing an evaluation and management service by telephone and the availability of in person appointments. The patient expressed understanding and agreed to proceed. ? ?Interactive audio and video telecommunications were attempted between this nurse and patient, however failed, due to patient having technical difficulties OR patient did not have access to video capability.  We continued and completed visit with audio only. ? ?Some vital signs may be absent or patient reported.  ? ?Chriss Driver, LPN ? ?Review of Systems    ? ?Cardiac Risk Factors include: advanced age (>7men, >70 women);diabetes mellitus;dyslipidemia;hypertension;sedentary lifestyle;obesity (BMI >30kg/m2) ? ?   ?Objective:  ?  ?Today's Vitals  ? 04/18/21 1006  ?Weight: 202 lb (91.6 kg)  ?Height: 5\' 4"  (1.626 m)  ? ?Body mass index is 34.67 kg/m?. ? ? ?  04/18/2021  ? 10:33 AM 10/17/2020  ?  9:33 AM 07/12/2018  ? 11:42 AM 10/15/2016  ?  1:52 PM  ?Advanced Directives  ?Does Patient Have a Medical Advance Directive? Yes Yes No No  ?Type of Paramedic of [place];Living will Living will    ?Copy of [place] in Chart? Yes - validated most recent copy scanned in chart (See row information)     ?Would patient like information on creating a medical advance directive?   No - Patient declined No - Patient declined  ? ? ?Current Medications (verified) ?Outpatient Encounter Medications as of 04/18/2021  ?Medication Sig  ?  acetaminophen (TYLENOL) 500 MG tablet Take 500 mg by mouth every 4 (four) hours as needed for headache (pain). 625mg   ? albuterol (VENTOLIN HFA) 108 (90 Base) MCG/ACT inhaler Inhale 2 puffs into the lungs every 6 (six) hours as needed for wheezing or shortness of breath.  ? amLODipine (NORVASC) 10 MG tablet TAKE 1 TABLET BY MOUTH DAILY  ? Biotin 5000 MCG CAPS Take by mouth.  ? Carboxymethylcellulose Sodium (REFRESH TEARS OP) Place 1 drop into both eyes at bedtime.   ? cholecalciferol (VITAMIN D3) 25 MCG (1000 UNIT) tablet Take 1,000 Units by mouth daily. Takes 2 tablets so 2,000  ? Continuous Blood Gluc Sensor (FREESTYLE LIBRE 2 SENSOR) MISC Inject 1 Device into the skin as directed. Use as directed every 14 days. E11.21  ? Docusate Sodium (COLACE PO) Take by mouth as needed.  ? ezetimibe (ZETIA) 10 MG tablet Take 1 tablet (10 mg total) by mouth daily.  ? insulin glargine, 2 Unit Dial, (TOUJEO MAX SOLOSTAR) 300 UNIT/ML Solostar Pen Inject 36 Units into the skin daily.  ? insulin lispro (HUMALOG KWIKPEN) 200 UNIT/ML KwikPen Inject 12 Units into the skin with breakfast, with lunch, and with evening meal. Max daily 80 units with correction scale  ? Insulin Pen Needle (PEN NEEDLES) 32G X 4 MM MISC 1 Device by Does not apply route in the morning, at noon, in the evening, and at bedtime.  ? loratadine (CLARITIN) 10 MG tablet Take 10 mg by mouth daily as needed (seasonal allergies).  ? losartan (COZAAR) 50 MG tablet Take  50 mg by mouth daily.  ? Misc. Devices MISC Please provide patient with insurance approved blood pressure monitor.  ? RYBELSUS 7 MG TABS TAKE 1 TABLET BY MOUTH DAILY AT 2 PM  ? vitamin B-12 (CYANOCOBALAMIN) 1000 MCG tablet Take 1,000 mcg by mouth daily.  ? ?No facility-administered encounter medications on file as of 04/18/2021.  ? ? ?Allergies (verified) ?Lisinopril, Lipitor [atorvastatin], Penicillins, and Septra [sulfamethoxazole-trimethoprim]  ? ?History: ?Past Medical History:  ?Diagnosis Date  ? CKD  (chronic kidney disease)   ? Diabetes mellitus without complication ([place])   ? Hyperlipidemia   ? Hypertension   ? ?Past Surgical History:  ?Procedure Laterality Date  ? BREAST BIOPSY    ? BREAST EXCISIONAL BIOPSY Left   ? ?Family History  ?Problem Relation Age of Onset  ? Skin cancer Mother   ? Heart attack Father   ? Colon cancer Sister   ? ?Social History  ? ?Socioeconomic History  ? Marital status: Widowed  ?  Spouse name: Not on file  ? Number of children: Not on file  ? Years of education: Not on file  ? Highest education level: Not on file  ?Occupational History  ? Not on file  ?Tobacco Use  ? Smoking status: Never  ? Smokeless tobacco: Never  ?Substance and Sexual Activity  ? Alcohol use: No  ? Drug use: No  ? Sexual activity: Not Currently  ?Other Topics Concern  ? Not on file  ?Social History Narrative  ? Not on file  ? ?Social Determinants of Health  ? ?Financial Resource Strain: Medium Risk  ? Difficulty of Paying Living Expenses: Somewhat hard  ?Food Insecurity: No Food Insecurity  ? Worried About Charity fundraiser in the Last Year: Never true  ? Ran Out of Food in the Last Year: Never true  ?Transportation Needs: No Transportation Needs  ? Lack of Transportation (Medical): No  ? Lack of Transportation (Non-Medical): No  ?Physical Activity: Sufficiently Active  ? Days of Exercise per Week: 5 days  ? Minutes of Exercise per Session: 30 min  ?Stress: Stress Concern Present  ? Feeling of Stress : To some extent  ?Social Connections: Moderately Integrated  ? Frequency of Communication with Friends and Family: More than three times a week  ? Frequency of Social Gatherings with Friends and Family: Never  ? Attends Religious Services: More than 4 times per year  ? Active Member of Clubs or Organizations: Yes  ? Attends Archivist Meetings: More than 4 times per year  ? Marital Status: Widowed  ? ? ?Tobacco Counseling ?Counseling given: Not Answered ? ? ?Clinical Intake: ? ?Pre-visit preparation  completed: Yes ? ?Pain : No/denies pain ? ?  ? ?BMI - recorded: 34.67 ?Nutritional Status: BMI > 30  Obese ?Nutritional Risks: None ?Diabetes: Yes ? ?How often do you need to have someone help you when you read instructions, pamphlets, or other written materials from your doctor or pharmacy?: 1 - Never ? ?Diabetic?Nutrition Risk Assessment: ? ?Has the patient had any N/V/D within the last 2 months?  No  ?Does the patient have any non-healing wounds?  No  ?Has the patient had any unintentional weight loss or weight gain?  No  ? ?Diabetes: ? ?Is the patient diabetic?  Yes  ?If diabetic, was a CBG obtained today?  No  ?Did the patient bring in their glucometer from home?  No  Phone visit. ?How often do you monitor your CBG's? Freestyle Libre 2.  ? ?  Financial Strains and Diabetes Management: ? ?Are you having any financial strains with the device, your supplies or your medication? No .  ?Does the patient want to be seen by Chronic Care Management for management of their diabetes?  No  ?Would the patient like to be referred to a Nutritionist or for Diabetic Management?  No  ? ?Diabetic Exams: ? ?Diabetic Eye Exam: Completed 06/2020. Pt has been advised about the importance in completing this exam.  ?Diabetic Foot Exam: Completed 02/09/2017. Pt has been advised about the importance in completing this exam. ? ?Interpreter Needed?: No ? ?Information entered by :: mj Orva Gwaltney, lpn ? ? ?Activities of Daily Living ? ?  04/18/2021  ? 10:34 AM  ?In your present state of health, do you have any difficulty performing the following activities:  ?Hearing? 0  ?Vision? 0  ?Difficulty concentrating or making decisions? 0  ?Walking or climbing stairs? 0  ?Dressing or bathing? 0  ?Doing errands, shopping? 0  ?Preparing Food and eating ? N  ?Using the Toilet? N  ?In the past six months, have you accidently leaked urine? N  ?Do you have problems with loss of bowel control? N  ?Managing your Medications? N  ?Managing your Finances? N   ?Housekeeping or managing your Housekeeping? N  ? ? ?Patient Care Team: ?Marrian Salvage, FNP as PCP - General (Internal Medicine) ?Geralynn Rile, MD as PCP - Cardiology (Cardiology) ? ?Indicate any recent

## 2021-04-18 NOTE — Telephone Encounter (Signed)
Pt  requests referrals to new Dermatology, ENT, Optometrist and GYN due to change in insurance. ?Derm-pt c/o "bumps" on scalp ?ENT-hearing loss and needs new hearing aids. ?Optometrist-vision changes ?GYN-vaginal irritation ?

## 2021-04-18 NOTE — Patient Instructions (Signed)
Hayley Case , ?Thank you for taking time to come for your Medicare Wellness Visit. I appreciate your ongoing commitment to your health goals. Please review the following plan we discussed and let me know if I can assist you in the future.  ? ?Screening recommendations/referrals: ?Colonoscopy: Done 05/25/2017 Repeat in 10 years ? ?Mammogram: Done 06/22/2020 Repeat annually ? ?Bone Density: Done 06/10/2017 Repeat every 2 years ? ?Recommended yearly ophthalmology/optometry visit for glaucoma screening and checkup ?Recommended yearly dental visit for hygiene and checkup ? ?Vaccinations: ?Influenza vaccine: Done 10/19/2020 Repeat annually ? ?Pneumococcal vaccine: Done 12/23/2019. Second dose due at your convenience. ?Tdap vaccine: Done 07/27/2019 Repeat in 10 years ? ?Shingles vaccine: Discussed.    ?Covid-19:Done 03/11/19, 04/05/19 and 11/18/2019. ? ?Advanced directives: Documents in chart. ? ?Conditions/risks identified: Aim for 30 minutes of exercise or brisk walking, 6-8 glasses of water, and 5 servings of fruits and vegetables each day. ? ? ?Next appointment: Follow up in one year for your annual wellness visit 2024. ? ? ?Preventive Care 70 Years and Older, Female ?Preventive care refers to lifestyle choices and visits with your health care provider that can promote health and wellness. ?What does preventive care include? ?A yearly physical exam. This is also called an annual well check. ?Dental exams once or twice a year. ?Routine eye exams. Ask your health care provider how often you should have your eyes checked. ?Personal lifestyle choices, including: ?Daily care of your teeth and gums. ?Regular physical activity. ?Eating a healthy diet. ?Avoiding tobacco and drug use. ?Limiting alcohol use. ?Practicing safe sex. ?Taking low-dose aspirin every day. ?Taking vitamin and mineral supplements as recommended by your health care provider. ?What happens during an annual well check? ?The services and screenings done by your  health care provider during your annual well check will depend on your age, overall health, lifestyle risk factors, and family history of disease. ?Counseling  ?Your health care provider may ask you questions about your: ?Alcohol use. ?Tobacco use. ?Drug use. ?Emotional well-being. ?Home and relationship well-being. ?Sexual activity. ?Eating habits. ?History of falls. ?Memory and ability to understand (cognition). ?Work and work Statistician. ?Reproductive health. ?Screening  ?You may have the following tests or measurements: ?Height, weight, and BMI. ?Blood pressure. ?Lipid and cholesterol levels. These may be checked every 5 years, or more frequently if you are over 24 years old. ?Skin check. ?Lung cancer screening. You may have this screening every year starting at age 40 if you have a 30-pack-year history of smoking and currently smoke or have quit within the past 15 years. ?Fecal occult blood test (FOBT) of the stool. You may have this test every year starting at age 45. ?Flexible sigmoidoscopy or colonoscopy. You may have a sigmoidoscopy every 5 years or a colonoscopy every 10 years starting at age 30. ?Hepatitis C blood test. ?Hepatitis B blood test. ?Sexually transmitted disease (STD) testing. ?Diabetes screening. This is done by checking your blood sugar (glucose) after you have not eaten for a while (fasting). You may have this done every 1-3 years. ?Bone density scan. This is done to screen for osteoporosis. You may have this done starting at age 37. ?Mammogram. This may be done every 1-2 years. Talk to your health care provider about how often you should have regular mammograms. ?Talk with your health care provider about your test results, treatment options, and if necessary, the need for more tests. ?Vaccines  ?Your health care provider may recommend certain vaccines, such as: ?Influenza vaccine. This is recommended every  year. ?Tetanus, diphtheria, and acellular pertussis (Tdap, Td) vaccine. You may  need a Td booster every 10 years. ?Zoster vaccine. You may need this after age 10. ?Pneumococcal 13-valent conjugate (PCV13) vaccine. One dose is recommended after age 19. ?Pneumococcal polysaccharide (PPSV23) vaccine. One dose is recommended after age 45. ?Talk to your health care provider about which screenings and vaccines you need and how often you need them. ?This information is not intended to replace advice given to you by your health care provider. Make sure you discuss any questions you have with your health care provider. ?Document Released: 01/26/2015 Document Revised: 09/19/2015 Document Reviewed: 10/31/2014 ?Elsevier Interactive Patient Education ? 2017 [place]. ? ?Fall Prevention in the Home ?Falls can cause injuries. They can happen to people of all ages. There are many things you can do to make your home safe and to help prevent falls. ?What can I do on the outside of my home? ?Regularly fix the edges of walkways and driveways and fix any cracks. ?Remove anything that might make you trip as you walk through a door, such as a raised step or threshold. ?Trim any bushes or trees on the path to your home. ?Use bright outdoor lighting. ?Clear any walking paths of anything that might make someone trip, such as rocks or tools. ?Regularly check to see if handrails are loose or broken. Make sure that both sides of any steps have handrails. ?Any raised decks and porches should have guardrails on the edges. ?Have any leaves, snow, or ice cleared regularly. ?Use sand or salt on walking paths during winter. ?Clean up any spills in your garage right away. This includes oil or grease spills. ?What can I do in the bathroom? ?Use night lights. ?Install grab bars by the toilet and in the tub and shower. Do not use towel bars as grab bars. ?Use non-skid mats or decals in the tub or shower. ?If you need to sit down in the shower, use a plastic, non-slip stool. ?Keep the floor dry. Clean up any water that spills on  the floor as soon as it happens. ?Remove soap buildup in the tub or shower regularly. ?Attach bath mats securely with double-sided non-slip rug tape. ?Do not have throw rugs and other things on the floor that can make you trip. ?What can I do in the bedroom? ?Use night lights. ?Make sure that you have a light by your bed that is easy to reach. ?Do not use any sheets or blankets that are too big for your bed. They should not hang down onto the floor. ?Have a firm chair that has side arms. You can use this for support while you get dressed. ?Do not have throw rugs and other things on the floor that can make you trip. ?What can I do in the kitchen? ?Clean up any spills right away. ?Avoid walking on wet floors. ?Keep items that you use a lot in easy-to-reach places. ?If you need to reach something above you, use a strong step stool that has a grab bar. ?Keep electrical cords out of the way. ?Do not use floor polish or wax that makes floors slippery. If you must use wax, use non-skid floor wax. ?Do not have throw rugs and other things on the floor that can make you trip. ?What can I do with my stairs? ?Do not leave any items on the stairs. ?Make sure that there are handrails on both sides of the stairs and use them. Fix handrails that are broken or  loose. Make sure that handrails are as long as the stairways. ?Check any carpeting to make sure that it is firmly attached to the stairs. Fix any carpet that is loose or worn. ?Avoid having throw rugs at the top or bottom of the stairs. If you do have throw rugs, attach them to the floor with carpet tape. ?Make sure that you have a light switch at the top of the stairs and the bottom of the stairs. If you do not have them, ask someone to add them for you. ?What else can I do to help prevent falls? ?Wear shoes that: ?Do not have high heels. ?Have rubber bottoms. ?Are comfortable and fit you well. ?Are closed at the toe. Do not wear sandals. ?If you use a stepladder: ?Make sure  that it is fully opened. Do not climb a closed stepladder. ?Make sure that both sides of the stepladder are locked into place. ?Ask someone to hold it for you, if possible. ?Clearly mark and make sure that y

## 2021-04-22 ENCOUNTER — Ambulatory Visit (HOSPITAL_COMMUNITY)
Admission: RE | Admit: 2021-04-22 | Discharge: 2021-04-22 | Disposition: A | Discharge status: Home / Self Care | Payer: Medicare HMO | Source: Ambulatory Visit | Attending: Cardiology | Admitting: Cardiology

## 2021-04-22 DIAGNOSIS — I6523 Occlusion and stenosis of bilateral carotid arteries: Secondary | ICD-10-CM

## 2021-04-23 ENCOUNTER — Ambulatory Visit (INDEPENDENT_AMBULATORY_CARE_PROVIDER_SITE_OTHER): Payer: Medicare HMO | Admitting: Psychology

## 2021-04-23 ENCOUNTER — Telehealth: Payer: Self-pay | Admitting: *Deleted

## 2021-04-23 ENCOUNTER — Other Ambulatory Visit: Payer: Self-pay | Admitting: Family

## 2021-04-23 DIAGNOSIS — H9190 Unspecified hearing loss, unspecified ear: Secondary | ICD-10-CM

## 2021-04-23 DIAGNOSIS — H539 Unspecified visual disturbance: Secondary | ICD-10-CM

## 2021-04-23 DIAGNOSIS — F4323 Adjustment disorder with mixed anxiety and depressed mood: Secondary | ICD-10-CM

## 2021-04-23 DIAGNOSIS — L989 Disorder of the skin and subcutaneous tissue, unspecified: Secondary | ICD-10-CM

## 2021-04-23 DIAGNOSIS — N898 Other specified noninflammatory disorders of vagina: Secondary | ICD-10-CM

## 2021-04-23 NOTE — Chronic Care Management (AMB) (Signed)
?  Care Management  ? ?Note ? ?04/23/2021 ?Name: Hayley Case MRN: [id number] DOB: 11/06/1950 ? ?Hayley Case is a 71 y.o. year old female who is a primary care patient of Marrian Salvage, [place]. I reached out to Rigoberto Noel by phone today offer care coordination services.  ? ?[name] was given information about care management services today including:  ?Care management services include personalized support from designated clinical staff supervised by her physician, including individualized plan of care and coordination with other care providers ?24/7 contact phone numbers for assistance for urgent and routine care needs. ?The patient may stop care management services at any time by phone call to the office staff. ? ?Patient agreed to services and verbal consent obtained.  ? ?Follow up plan: ?Telephone appointment with care management team member scheduled for: 05/15/2021 ? ?Kitzia Camus, CCMA ?Care Guide, Embedded Care Coordination ?[facility]  Care Management  ?Direct Dial: [phone] ? ? ?

## 2021-04-23 NOTE — Progress Notes (Signed)
[facility] Counselor/Therapist Progress Note ? ?Patient ID: Hayley Case, MRN: [id number],   ? ?Date: 04/23/2021 ? ?Time Spent: 11:00am-11:55am   55 minutes  ? ?Treatment Type: Individual Therapy ? ?Reported Symptoms: stress and overwhelm ? ?Mental Status Exam: ?Appearance:  Casual     ?Behavior: Appropriate  ?Motor: Normal  ?Speech/Language:  Normal Rate  ?Affect: Appropriate  ?Mood: normal  ?Thought process: normal  ?Thought content:   WNL  ?Sensory/Perceptual disturbances:   WNL  ?Orientation: oriented to person, place, time/date, and situation  ?Attention: Good  ?Concentration: Good  ?Memory: WNL  ?Fund of knowledge:  Good  ?Insight:   Good  ?Judgment:  Good  ?Impulse Control: Good  ? ?Risk Assessment: ?Danger to Self:  No ?Self-injurious Behavior: No ?Danger to Others: No ?Duty to Warn:no ?Physical Aggression / Violence:No  ?Access to Firearms a concern: No  ?Gang Involvement:No  ? ?Subjective: Pt present for face-to-face individual therapy via video Webex.  Pt consents to telehealth video session due to COVID 19 pandemic. ?Location of pt: home. ?Location of therapist: home office.  ?Pt and son will be looking at houses to buy tomorrow.  Pt feels stress about the loan process.   ?Addressed pt's overwhelm.   Worked on Child psychotherapist.   ?Pt talked about her health.  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   Addressed pt's health concerns.   ?Pt talked about caregiving for her son.  He has not been feeling well.   Pt tends to worry about him.   Worked on managing worry thoughts.   Addressed the stress of caregiving.  Worked on Child psychotherapist.  ?Provided supportive therapy.    ? ?Interventions: Cognitive Behavioral Therapy and Insight-Oriented ? ?Diagnosis: F43.23 ? ?Plan: See pt's Treatment Plan for depression and anxiety in Therapy Charts.  (Treatment Plan Target Date: 11/05/2021) ?Pt is progressing toward treatment goals.   ?Plan to  continue to see pt monthly.   ? ?[place]on Pam, LCSW ? ? ? ?

## 2021-05-15 ENCOUNTER — Ambulatory Visit (INDEPENDENT_AMBULATORY_CARE_PROVIDER_SITE_OTHER): Payer: Medicare HMO | Admitting: Pharmacist

## 2021-05-15 DIAGNOSIS — E1122 Type 2 diabetes mellitus with diabetic chronic kidney disease: Secondary | ICD-10-CM

## 2021-05-15 DIAGNOSIS — I10 Essential (primary) hypertension: Secondary | ICD-10-CM

## 2021-05-15 DIAGNOSIS — E785 Hyperlipidemia, unspecified: Secondary | ICD-10-CM

## 2021-05-15 NOTE — Chronic Care Management (AMB) (Signed)
? ? ?Chronic Care Management ?Pharmacy Note ? ?05/19/2021 ?Name:  Hayley Case MRN:  [id number] DOB:  05/15/1950 ? ?Summary: ?Medication Management: Patient reported that cost for Medications was high. Discussed today and highest cost medications are Toujeo, Rybelsus and Humalog. All 3 medications are $75 copay per 90 days per patient. Screened for LIS / Medicare Extra Help and also for medication assistance programs with manufacturers but patient's yearly income was too high (has Medicare + pension).  ?Patient provided with information on [employer] over-the-counter benefits. She has $45 per quarter to use on over-the-counter medications. Provided catalog to order over-the-counter products.  ?Diabetes: Discussed medications and indications. She is seeing Dr Kelton Pillar. Informed patient that upcoming appointment will be at main [place] office and not the Primary care office this time. Patient is using Continuous Glucose Monitor regularly to monitor blood glucose and adjust Humalog dose.  ?Hyperlipidemia: Last LDL was at goal of < 70 but was checked when she was still taking statin. Patient reports she was instructed by cardiologist recently to restarted rosuvastatin 27m daily however she has not restarted yet. States that she is concerned due to having myalgias previously when she took rosuvastatin and increase in LFTs with atorvastatin. She is taking ezetimibe 147mdaily. Recommended patient retry rosuvastatin 4067m take 0.5 tablet = 46m71m MWF 3 times per week. Add Co Enzyme Q10 daily. Continue ezetimibe. Recheck lipids and LFTs in 4 to 6 weeks.  ?Hypertension: last blood pressure was elevated above goal of < 130/80 (CKD) . But blood pressure usually at goal. Patient states she is not checking blood pressure at home. Does not have blood pressure cuff. Discussed purchasing blood pressure cuff with over-the-counter benefits ? ?Appointment for follow up with PCP made today for June 21, 2021 to recheck hypertension  and lipids.  ? ?Subjective: ?Hayley Case who is a primary patient of [facility] CCM team was consulted for assistance with disease management and care coordination needs.   ? ?Engaged with patient by telephone for initial visit in response to provider referral for pharmacy case management and/or care coordination services.  ? ?Consent to Services:  ?The patient was given the following information about Chronic Care Management services today, agreed to services, and gave verbal consent: 1. CCM service includes personalized support from designated clinical staff supervised by the primary care provider, including individualized plan of care and coordination with other care providers 2. 24/7 contact phone numbers for assistance for urgent and routine care needs. 3. Service will only be billed when office clinical staff spend 20 minutes or more in a month to coordinate care. 4. Only one practitioner may furnish and bill the service in a calendar month. 5.The patient may stop CCM services at any time (effective at the end of the month) by phone call to the office staff. 6. The patient will be responsible for cost sharing (co-pay) of up to 20% of the service fee (after annual deductible is met). Patient agreed to services and consent obtained. ? ?Patient Care Team: ?MurrMarrian SalvageP as PCP - General (Internal Medicine) ?O'NeGeralynn Rile as PCP - Cardiology (Cardiology) ?EckaCherre RobinsH-CPP (Pharmacist) ? ?Recent office visits: ?10/19/2020 - Int Med ([facility]) Seen for back pain and myalgias. Crestor 40mg83mpped at last visit and myalgias improving. Prescribed lower dose of Crestor 46mg 21my, continue ezetimibe. Ordered MRI of lumbar spine.  ? ?Recent consult visits: ?03/19/2021 -  Cardio (Dr Fletcher Anon) Seen for PAD. No med changes noted. Has been intolerate to statin in past. On ezetimibe, if not able to get LDL to < 70 , then consider  PCSK9. ?12/25/2020 - Endo (Dr Kelton Pillar) F/U Diabetes. Lowered dose of Toujeo to 32 units caily. Continue Humalog 12 units with correction factor with each meal and continue Rybelsus 64m wiht breakfast.  ? ?Hospital visits: ?None in previous 6 months ? ?Objective: ? ?Lab Results  ?Component Value Date  ? CREATININE 1.98 (H) 09/18/2020  ? CREATININE 2.17 (H) 05/06/2019  ? CREATININE 1.86 (H) 07/28/2018  ? ? ?Lab Results  ?Component Value Date  ? HGBA1C 7.9 (A) 12/25/2020  ? ?Last diabetic Eye exam:  ?Lab Results  ?Component Value Date/Time  ? HMDIABEYEEXA No Retinopathy 12/16/2018 02:40 PM  ?  ?Last diabetic Foot exam: No results found for: HMDIABFOOTEX  ? ?   ?Component Value Date/Time  ? CHOL 114 05/06/2019 1222  ? TRIG 145 05/06/2019 1222  ? HDL 35 (L) 05/06/2019 1222  ? CHOLHDL 3.3 05/06/2019 1222  ? [place]54 05/06/2019 1222  ? ? ? ?  Latest Ref Rng & Units 09/18/2020  ?  9:53 AM 05/06/2019  ? 12:22 PM 07/28/2018  ?  4:43 PM  ?Hepatic Function  ?Total Protein 6.0 - 8.3 g/dL 6.8   6.5   6.8    ?Albumin 3.5 - 5.2 g/dL 3.6   3.6   3.7    ?AST 0 - 37 U/L 13   17   172    ?ALT 0 - 35 U/L 13   26   295    ?Alk Phosphatase 39 - 117 U/L 105   212   527    ?Total Bilirubin 0.2 - 1.2 mg/dL 0.6   0.5   0.8    ? ? ?Lab Results  ?Component Value Date/Time  ? TSH 2.63 09/18/2020 09:53 AM  ? ? ? ?  Latest Ref Rng & Units 09/18/2020  ?  9:53 AM 07/28/2018  ?  4:43 PM 07/14/2018  ?  5:53 AM  ?CBC  ?WBC 4.0 - 10.5 K/uL 9.4   6.1   4.8    ?Hemoglobin 12.0 - 15.0 g/dL 11.7   12.2   11.3    ?Hematocrit 36.0 - 46.0 % 34.2   36.7   34.2    ?Platelets 150.0 - 400.0 K/uL 329.0   232   189    ? ? ?Lab Results  ?Component Value Date/Time  ? VD25OH 21.3 (L) 05/18/2018 02:46 PM  ? VD25OH 16.7 (L) 11/13/2017 11:36 AM  ? ? ?Clinical ASCVD: Yes  ?The ASCVD Risk score (Arnett DK, et al., 2019) failed to calculate for the following reasons: ?  The valid total cholesterol range is 130 to 320 mg/dL   ? ? ?Social History  ? ?Tobacco Use  ?Smoking Status  Never  ?Smokeless Tobacco Never  ? ?BP Readings from Last 3 Encounters:  ?03/19/21 (!) 160/90  ?12/25/20 130/86  ?10/19/20 140/70  ? ?Pulse Readings from Last 3 Encounters:  ?03/19/21 96  ?12/25/20 100  ?10/19/20 97  ? ?Wt Readings from Last 3 Encounters:  ?04/18/21 202 lb (91.6 kg)  ?03/19/21 202 lb 6.4 oz (91.8 kg)  ?12/25/20 205 lb (93 kg)  ? ? ?Assessment: Review of patient past medical history, allergies, medications, health status, including review of consultants reports, laboratory and other test data, was performed as part of comprehensive evaluation and provision of chronic care management services.  ? ?  SDOH:  (Social Determinants of Health) assessments and interventions performed:  ? ? ?CCM Care Plan ? ?Allergies  ?Allergen Reactions  ? Lisinopril Cough  ? Lipitor [Atorvastatin] Other (See Comments)  ?  Liver function test elevation  ? Penicillins Hives and Itching  ?  Did it involve swelling of the face/tongue/throat, SOB, or low BP? No ?Did it involve sudden or severe rash/hives, skin peeling, or any reaction on the inside of your mouth or nose? Yes ?Did you need to seek medical attention at a hospital or doctor's office? Yes ?When did it last happen?      2012 ?If all above answers are "NO", may proceed with cephalosporin use.  ? Septra [Sulfamethoxazole-Trimethoprim] Hives  ? ? ?Medications Reviewed Today   ? ? Reviewed by Cherre Robins, RPH-CPP (Pharmacist) on 05/15/21 at 1047  Med List Status: <None>  ? ?Medication Order Taking? Sig Documenting Provider Last Dose Status Informant  ?ACETAMINOPHEN PO [id number] Yes Take 650 mg by mouth every 6 (six) hours as needed for headache or moderate pain. [provider] Taking Active Self  ?albuterol (VENTOLIN HFA) 108 (90 Base) MCG/ACT inhaler [id number] Yes Inhale 2 puffs into the lungs every 6 (six) hours as needed for wheezing or shortness of breath. Geralynn Rile, MD Taking Active   ?allopurinol (ZYLOPRIM) 100 MG tablet [id number] Yes Take  100 mg by mouth daily. [provider] Taking Active   ?amLODipine (NORVASC) 10 MG tablet [id number] Yes TAKE 1 TABLET BY MOUTH DAILY O'Neal, Cassie Freer, MD Taking Active   ?Biotin 5000 MCG CAPS [id number]

## 2021-05-19 MED ORDER — LOSARTAN POTASSIUM 50 MG PO TABS: 50.0000 mg | ORAL_TABLET | Freq: Every day | ORAL | 0 refills | Status: DC

## 2021-05-19 NOTE — Patient Instructions (Signed)
Hayley Case,  ?It was a pleasure speaking with you  ?Below is a summary of your health goals and care plan ? ?If you have any questions or concerns, please feel free to contact me either at the phone number below or with a MyChart message.  ? ?Keep up the good work! ? ?Cherre Robins, PharmD ?Clinical Pharmacist ?[facility] ?[place] ?[phone] (direct line)  ?[phone] (main office number) ? ? ?Chronic Care Management Care Plan ? ?Diabetes:   ?Not at goal; A1c goal < 7.5%.  ?Lab Results  ?Component Value Date  ? HGBA1C 7.9 (A) 12/25/2020  ? ?Current treatment: ?Rybelsus 7mg  daily  ?Toujeo Max Pen 300u/ mL - inject 36 units daily ?Humalog U200/mL - inject 12 units + additional correction amounts prior to each meal based on blood glucose.  ?Freestyle Libre 2 Sensor - Continuous Glucose Monitor  ?Interventions:  ?Reviewed home blood glucose readings and reviewed goals  ?Fasting blood glucose goal (before meals) = 80 to 130 ?Blood glucose goal after a meal = less than 180  ?Reminded patient of upcoming endocrinology appointment. Informed patient that upcoming appointment will be at main [place] office and not the Primary care office this time.  ? ?Hyperlipidemia:  ?Last LDL was at goal of < 70 but was checked when she was still taking statin.  ? ?Lab Results  ?Component Value Date  ? [place] 54 05/06/2019  ? LDLCALC 132 (H) 05/18/2018  ? LDLCALC 124 (H) 02/09/2017  ? ?Lab Results  ?Component Value Date  ? TRIG 145 05/06/2019  ? TRIG 296 (H) 05/18/2018  ? TRIG 125 02/09/2017  ?  ?Current treatment:  ?ezetimibe 10mg  daily.  ?Interventions:  ?Recommended patient retry rosuvastatin 40mg  - take 0.5 tablet = 20mg  on Mondays, Wednesdays and Fridays 3 times per week.  ?Add Co Enzyme Q10 100 to 200mg  daily help with possible myalgias.  ?Continue ezetimibe.  ?Recheck lipids in 4 to 6 weeks.  ? ?Hypertension:  ?last blood pressure was elevated above goal of < 130/80. But blood pressure usually  at goal.  ?Current treatment:  ?Amlodipine 10mg  daily  ?Losartan 50mg  daily ?Interventions:  ?Discussed purchasing blood pressure cuff with over-the-counter benefits thru [employer].  ?Start checking blood pressure 1 to 2 times per week, record and provide at future appointments.  ?Reviewed goal blood pressure of < 130/80 ?Limit intake of sodium to less than 2300mg  of sodium per day.  ? ?Medication Management:  ?Patient reported that cost for Medications was high. Discussed today and highest cost medications are Toujeo, Rybelsus and Humalog.   ?Goal: work with PharmD and providers to maintain optimal medication adherence and when able identify opportunities to lower medication cost. ?Current pharmacy: [facility] ?Interventions:  ?All 3 medications - Toujeo, Rybelsus and Humalog - are $75 copay per 90 days per patient. Screened for Medicare Extra Help and also for medication assistance programs with manufacturers. Mailed application for [employer] for patient to complete and return to office.  ?Reviewed refill history and assessed adherence. Sent in updated Rx for 90 days for losartan instead of 30 days.  ?Patient provided with information on [employer] over-the-counter benefits. Has $45 per quarter to use on over-the-counter medications. Naval architect over-the-counter products and how to order to patient.  ? ?Patient Goals/Self-Care Activities ?Over the next 90 days, patient will:  ?take medications as prescribed,  ?check glucose using Continuous Glucose Monitor prior to every meal and as needed, document, and provide at future appointments,  ?check  blood pressure 1 to 2 times per week (after purchases home blood pressure cuff), document, and provide at future appointments,  ?collaborate with provider on medication access solutions ?[employer] over-the-counter benefits ($45 per quarter) - mailing a catalog to you.  ?Restart rosuvastatin 40mg  - take 0.5 tablet = 20mg  on Monday, Wednesdays and Friday.  ?Take  CoEnzyme Q10 100 to 200mg  daily to help lower chance of myalgia with statin medication. ? ?Follow Up Plan: Telephone follow up appointment with care management team member scheduled for:  6 weeks.  Appointment for follow up with PCP made today for June 21, 2021 to recheck hypertension and lipids.  ? ?Patient verbalizes understanding of instructions and care plan provided today and agrees to view in [place]. Active MyChart status confirmed with patient.    ?

## 2021-05-19 NOTE — Chronic Care Management (AMB) (Signed)
Patient Care Plan: General Pharmacy (Adult)  ?  ? ?Problem Identified: Chronic Conditions: hypertension, hyperlipidemia, diabetes, decrease in kidney function   ?Priority: High  ?Onset Date: 05/15/2021  ?  ? ?Long-Range Goal: Provide education, support and care coordination for medication therapy and chronic conditions   ?Start Date: 05/15/2021  ?Priority: High  ?Note:   ?Current Barriers:  ?Unable to independently afford treatment regimen ?Unable to independently monitor therapeutic efficacy ?Unable to maintain control of hypertension and hyperlipidemia ? ?Pharmacist Clinical Goal(s):  ?Over the next 90 days, patient will verbalize ability to afford treatment regimen ?achieve control of hypertension and HDL as evidenced by blood pressure < 130/80 and LDL < 70 ?adhere to plan to optimize therapeutic regimen for type 2 diabetes and HDL as evidenced by report of adherence to recommended medication management changes through collaboration with PharmD and provider.  ? ?Interventions: ?1:1 collaboration with Marrian Salvage, [place] regarding development and update of comprehensive plan of care as evidenced by provider attestation and co-signature ?Inter-disciplinary care team collaboration (see longitudinal plan of care) ?Comprehensive medication review performed; medication list updated in electronic medical record ? ? ?Diabetes:   ?Not at goal; A1c goal < 7.5%.  ?Sees Dr Kelton [facility]illar at [facility] ?Current treatment: ?Rybelsus 7mg  daily  ?Toujeo Max Pen 300u/ mL - inject 36 units daily ?Humalog U200/mL - inject 12 units + additional correction amounts prior to each meal based on blood glucose.  ?Freestyle Libre 2 Sensor - Continuous Glucose Monitor  ?Denies hypoglycemic/hyperglycemic symptoms ?Interventions:  ?Reviewed home blood glucose readings and reviewed goals  ?Fasting blood glucose goal (before meals) = 80 to 130 ?Blood glucose goal after a meal = less than 180  ?Reviewed for medication assistance  program for Ryblesus, Toujeo and Humalog (See below - medication manaagement) ?Reminded patient of upcoming endocrinology appointment. Informed patient that upcoming appointment will be at main [place] office and not the Primary care office this time.  ? ?Hyperlipidemia:  ?Last LDL was at goal of < 70 but was checked when she was still taking statin.  ?She has not taken statin in several months due to rosuvastatin 40mg  causing myalgias and atorvastatin causing increased LFTs in the past.  ?Patient reports she was instructed by cardiologist recently to restarted rosuvastatin 40mg  daily however she has not restarted yet. States that she is concerned about myalgias.  ?Current treatment:  ?ezetimibe 10mg  daily.  ?Interventions:  ?Recommended patient retry rosuvastatin 40mg  - take 0.5 tablet = 20mg  on MWF 3 times per week.  ?Add Co Enzyme Q10 100 to 200mg  daily help with possible myalgias.  ?Continue ezetimibe.  ?Recheck lipids and LFTs in 4 to 6 weeks.  ? ?Hypertension:  ?last blood pressure was elevated above goal of < 130/80 (CKD) . But blood pressure usually at goal.  ?Current treatment:  ?Amlodipine 10mg  daily  ?Losartan 50mg  daily ?Patient states she is not checking blood pressure at home. Does not have blood pressure cuff.  ?Interventions:  ?Discussed purchasing blood pressure cuff with over-the-counter benefits thru [employer].  ?Start checking blood pressure 1 to 2 times per week, record and provide at future appointments.  ?Reviewed goal blood pressure of < 130/80 ?Limit intake of sodium to less than 2300mg  of sodium per day.  ? ? ?Medication Management:  ?Patient reported that cost for Medications was high. Discussed today and highest cost medications are Toujeo, Rybelsus and Humalog.   ?Goal: work with PharmD and providers to maintain optimal medication adherence and when able identify  opportunities to lower medication cost. ?Current pharmacy: [facility] ? ?Interventions:  ?All 3 medications - Toujeo,  Rybelsus and Humalog - are $75 copay per 90 days per patient. Screened for LIS / Medicare Extra Help and also for medication assistance programs with manufacturers but patient's yearly income likely will not meet eligibility criteria. Mailed application for [employer] for patient to complete and return to office.  ?Reviewed refill history and assessed adherence. Sent in updated Rx for 90 days for losartan instead of 30 days.  ?Patient provided with information on [employer] over-the-counter benefits. She has $45 per quarter to use on over-the-counter medications. Provided catalog to order over-the-counter products.  ? ? ? ?Patient Goals/Self-Care Activities ?Over the next 90 days, patient will:  ?take medications as prescribed,  ?check glucose using Continuous Glucose Monitor prior to every meal and as needed, document, and provide at future appointments,  ?check blood pressure 1 to 2 times per week (after purchases home blood pressure cuff), document, and provide at future appointments,  ?collaborate with provider on medication access solutions ?[employer] over-the-counter benefits ($45 per quarter) - mailing a catalog to you.  ?Restart rosuvastatin 40mg  - take 0.5 tablet = 20mg  on Monday, Wednesdays and Friday.  ?Take CoEnzyme Q10 100 to 200mg  daily to help lower chance of myalgia with statin medication. ? ?Follow Up Plan: Telephone follow up appointment with care management team member scheduled for:  6 weeks.   Appointment for follow up with PCP made today for June 21, 2021 to recheck hypertension and lipids.  ? ?  ?\ ?

## 2021-05-21 ENCOUNTER — Ambulatory Visit (INDEPENDENT_AMBULATORY_CARE_PROVIDER_SITE_OTHER): Payer: Medicare HMO | Admitting: Psychology

## 2021-05-21 DIAGNOSIS — F4323 Adjustment disorder with mixed anxiety and depressed mood: Secondary | ICD-10-CM

## 2021-05-21 NOTE — Progress Notes (Signed)
[facility] Counselor/Therapist Progress Note ? ?Patient ID: Hayley Case, MRN: [id number],   ? ?Date: 05/21/2021 ? ?Time Spent: 10:00am-10:55am   55 minutes  ? ?Treatment Type: Individual Therapy ? ?Reported Symptoms: stress and overwhelm ? ?Mental Status Exam: ?Appearance:  Casual     ?Behavior: Appropriate  ?Motor: Normal  ?Speech/Language:  Normal Rate  ?Affect: Appropriate  ?Mood: normal  ?Thought process: normal  ?Thought content:   WNL  ?Sensory/Perceptual disturbances:   WNL  ?Orientation: oriented to person, place, time/date, and situation  ?Attention: Good  ?Concentration: Good  ?Memory: WNL  ?Fund of knowledge:  Good  ?Insight:   Good  ?Judgment:  Good  ?Impulse Control: Good  ? ?Risk Assessment: ?Danger to Self:  No ?Self-injurious Behavior: No ?Danger to Others: No ?Duty to Warn:no ?Physical Aggression / Violence:No  ?Access to Firearms a concern: No  ?Gang Involvement:No  ? ?Subjective: Pt present for face-to-face individual therapy via video Webex.  Pt consents to telehealth video session due to COVID 19 pandemic. ?Location of pt: home. ?Location of therapist: home office.  ?Pt talked about traveling with her son.  He surprised her with having family join them.   Pt was very happy to see her family that she has not seen in a while.   ?Pt talked about house hunting.  They put an offer in on a house but they did not get the bid.  Pt was disappointed.  Addressed pt's disappointment and frustration regarding the process of looking for a home.  ?Pt talked about her health.  Pt saw her PCP and states it was a frustrating appointment.   Pt has high cholestoral.  The medicine she has to take gives her muscle cramps.   Pt felt like her PCP did not listen to her.   Helped pt process her feelings.  Addressed how pt can access a new PCP if she decides that would be best for her.   ?Pt talked about her brother's wife being in hospice.  Pt is talking with him and trying to be supportive.  Her  brother's wife has dementia.   ?Pt talked about caregiving for her son.    Pt tends to worry about him.   Worked on managing worry thoughts.   Addressed the stress of caregiving.  Worked on Child psychotherapist.  ?Provided supportive therapy.    ? ?Interventions: Cognitive Behavioral Therapy and Insight-Oriented ? ?Diagnosis: F43.23 ? ?Plan: See pt's Treatment Plan for depression and anxiety in Therapy Charts.  (Treatment Plan Target Date: 11/05/2021) ?Pt is progressing toward treatment goals.   ?Plan to continue to see pt monthly.   ? ?[name], LCSW ? ? ? ? ?

## 2021-05-28 ENCOUNTER — Telehealth: Payer: Self-pay | Admitting: Pharmacist

## 2021-05-28 NOTE — Telephone Encounter (Signed)
Patient called to report that she has been taking lower dose of rosuvastatin at 20mg  3 times per week and she also started CoEnzyme Q10 daily but she is still having muscle cramps in her legs and having a hard time walking. Recommended she continue ezetimibe 10mg  daily but suggested she hold rosuvastatin for the next 2 to 3 weeks. She has follow up with PCP 06/15/2021.  ? ?Patient also said she had a call from nurse at [facility] to verify if patient was taking allopurinol 10 or 100mg  daily. Patient is taking allopurinol 100mg  daily - there are only 3 strengths 100mg , 200mg  and 300mg . Patient could not remember exactly what the nurse said but seemed concerned about allopurinol dose.  ?Explained to patient that allopurinol is dosed based on her kidney function. She thought that Scr was 2.00 when nephrologist checked. Based on that, estimated CrCL would be 17 mL/min.  ?Recommended dose of allopurinol is 100mg  daily as long as CrCl is > 69mL / min.  ?Provided number for [facility] [phone] and recommended patient contact their office for instruction and clarification.  ?

## 2021-05-30 ENCOUNTER — Encounter: Payer: Self-pay | Admitting: Family Medicine

## 2021-05-30 ENCOUNTER — Ambulatory Visit (INDEPENDENT_AMBULATORY_CARE_PROVIDER_SITE_OTHER): Payer: Medicare HMO | Admitting: Family Medicine

## 2021-05-30 VITALS — BP 137/73 | HR 101 | Wt 205.0 lb

## 2021-05-30 DIAGNOSIS — N898 Other specified noninflammatory disorders of vagina: Secondary | ICD-10-CM

## 2021-05-30 MED ORDER — FLUCONAZOLE 150 MG PO TABS: 150.0000 mg | ORAL_TABLET | ORAL | 0 refills | Status: DC

## 2021-05-30 NOTE — Patient Instructions (Signed)
Take diflucan tablet today, Sunday, and Wednesday. You can use coconut oil twice a day for vaginal irritation

## 2021-05-30 NOTE — Progress Notes (Signed)
Patient states she is having some vaginal irritation. Patient states she previously had a UTI. Patient states she tried monistat and didn't get relief.  Kathrene Alu RN

## 2021-05-30 NOTE — Progress Notes (Signed)
   Subjective:    Patient ID: Hayley Case, female    DOB: [birth year], 71 y.o.   MRN: [id number]  HPI Patient seen for vaginal irritation.  She was recently placed on antibiotics for UTI.  Irritation started following that.  She does wear a pad fairly continuously due to urinary problems.  She was seeing urologist.  She has tried Monistat and Vagisil without improvement.  Itching is mostly on the outside but also on the inside of the vaginal canal.   Review of Systems     Objective:   Physical Exam Vitals reviewed. Exam conducted with a chaperone present.  Constitutional:      Appearance: Normal appearance.  Genitourinary:   Neurological:     Mental Status: She is alert.      Assessment & Plan:  1. Vaginal irritation Will give diflucan. Discussed symptomatic relief with coconut oil periodically. F/u in 2-3 months.

## 2021-06-02 NOTE — Progress Notes (Signed)
Cardiology Office Note:   Date:  06/03/2021  NAME:  Hayley Case    MRN: [id number] DOB:  1950-02-08   PCP:  Marrian Salvage, FNP  Cardiologist:  Evalina Field, MD  Electrophysiologist:  None   Referring MD: Marrian Salvage,*   Chief Complaint  Patient presents with   Follow-up   History of Present Illness:   Hayley Case is a 71 y.o. female with a hx of DM, CKD III, PAD, carotid artery disease who presents for follow-up.  Cholesterol uncontrolled.  Cannot tolerate Crestor.  Hayley Case cannot tolerate statin medications due to leg cramping.  On Zetia.  We discussed PCSK9 inhibitor therapy.  Hayley Case is interested.  Blood pressure 130/68.  Not exercising.  Her son does home dialysis and this requires a lot of her time.  Hayley Case reports no chest pain.  Hayley Case can get short of breath with activity but Hayley Case attributes this to inactivity.  Hayley Case continues to have pain in her legs.  Describes sharp pain.  Occurs while standing for long time.  Does not appear to be related to activity other than just standing.  Hayley Case has been seen by Dr. Velva Harman.  He has recommended medical management.  Carotids remain similar.  Kidney function is stable.  Hayley Case is not anemic.  Big issue today is getting cholesterol under control.  Problem List 1. HTN 2. Obesity (BMI 36) 3. Diabetes -A1c 7.9 -T chol 270, HDL 59, LDL 180, triglycerides 172 4. Carotid artery disease  -R ICA 1-39% -L ICA 1-39% 5. 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 PAD -R SFA 50-74% -L popliteal 50-74%  Past Medical History: Past Medical History:  Diagnosis Date   CKD (chronic kidney disease)    Diabetes mellitus without complication ([place])    Hyperlipidemia    Hypertension     Past Surgical History: Past Surgical History:  Procedure Laterality Date   BREAST BIOPSY     BREAST EXCISIONAL BIOPSY Left     Current Medications: Current Meds  Medication Sig   ACETAMINOPHEN PO Take 650 mg by mouth every 6 (six) hours as needed for headache or moderate  pain.   albuterol (VENTOLIN HFA) 108 (90 Base) MCG/ACT inhaler Inhale 2 puffs into the lungs every 6 (six) hours as needed for wheezing or shortness of breath.   allopurinol (ZYLOPRIM) 100 MG tablet Take 100 mg by mouth daily.   amLODipine (NORVASC) 10 MG tablet TAKE 1 TABLET BY MOUTH DAILY   Biotin 5000 MCG CAPS Take 1 capsule by mouth daily.   Carboxymethylcellulose Sodium (REFRESH TEARS OP) Place 1 drop into both eyes at bedtime.    cholecalciferol (VITAMIN D3) 25 MCG (1000 UNIT) tablet Take 2,000 Units by mouth daily.   Co-Enzyme Q10 100 MG CAPS Take 1 tablet by mouth daily.   Continuous Blood Gluc Sensor (FREESTYLE LIBRE 2 SENSOR) MISC Inject 1 Device into the skin as directed. Use as directed every 14 days. E11.21   Docusate Sodium (COLACE PO) Take 1 capsule by mouth daily.   ezetimibe (ZETIA) 10 MG tablet Take 1 tablet (10 mg total) by mouth daily.   fluconazole (DIFLUCAN) 150 MG tablet Take 1 tablet (150 mg total) by mouth every 3 (three) days. For three doses   fluocinonide (LIDEX) 0.05 % external solution Apply topically in the morning and at bedtime.   insulin glargine, 2 Unit Dial, (TOUJEO MAX SOLOSTAR) 300 UNIT/ML Solostar Pen Inject 36 Units into the skin daily.   insulin lispro (HUMALOG KWIKPEN) 200  UNIT/ML KwikPen Inject 12 Units into the skin with breakfast, with lunch, and with evening meal. Max daily 80 units with correction scale   Insulin Pen Needle (PEN NEEDLES) 32G X 4 MM MISC 1 Device by Does not apply route in the morning, at noon, in the evening, and at bedtime.   loratadine (CLARITIN) 10 MG tablet Take 10 mg by mouth daily as needed (seasonal allergies).   losartan (COZAAR) 50 MG tablet Take 1 tablet (50 mg total) by mouth daily.   RYBELSUS 7 MG TABS TAKE 1 TABLET BY MOUTH DAILY AT 2 PM   vitamin B-12 (CYANOCOBALAMIN) 1000 MCG tablet Take 1,000 mcg by mouth daily.     Allergies:    Lisinopril, Lipitor [atorvastatin], Penicillins, Rosuvastatin, and Septra  [sulfamethoxazole-trimethoprim]   Social History: Social History   Socioeconomic History   Marital status: Widowed    Spouse name: Not on file   Number of children: Not on file   Years of education: Not on file   Highest education level: Not on file  Occupational History   Not on file  Tobacco Use   Smoking status: Never   Smokeless tobacco: Never  Vaping Use   Vaping Use: Never used  Substance and Sexual Activity   Alcohol use: No   Drug use: No   Sexual activity: Not Currently  Other Topics Concern   Not on file  Social History Narrative   Not on file   Social Determinants of Health   Financial Resource Strain: Medium Risk   Difficulty of Paying Living Expenses: Somewhat hard  Food Insecurity: No Food Insecurity   Worried About Running Out of Food in the Last Year: Never true   Ran Out of Food in the Last Year: Never true  Transportation Needs: No Transportation Needs   Lack of Transportation (Medical): No   Lack of Transportation (Non-Medical): No  Physical Activity: Sufficiently Active   Days of Exercise per Week: 5 days   Minutes of Exercise per Session: 30 min  Stress: Stress Concern Present   Feeling of Stress : To some extent  Social Connections: Moderately Integrated   Frequency of Communication with Friends and Family: More than three times a week   Frequency of Social Gatherings with Friends and Family: Never   Attends Religious Services: More than 4 times per year   Active Member of [employer] or Organizations: Yes   Attends Archivist Meetings: More than 4 times per year   Marital Status: Widowed     Family History: The patient's family history includes Colon cancer in her sister; Heart attack in her father; Skin cancer in her mother.  ROS:   All other ROS reviewed and negative. Pertinent positives noted in the HPI.     EKGs/Labs/Other Studies Reviewed:   The following studies were personally reviewed by me today:  Carotid US  04/22/2021 Summary:  Right Carotid: Velocities in the right ICA are consistent with a 1-39%  stenosis,                 high end range. Non-hemodynamically significant plaque <50%  noted                 in the CCA.   Left Carotid: Velocities in the left ICA are consistent with a 1-39%  stenosis,                high end range. Non-hemodynamically significant plaque <50%  noted  in the CCA.   TTE 08/17/2018  1. The left ventricle has normal systolic function with an ejection  fraction of 60-65%. The cavity size was normal. There is mild asymmetric  left ventricular hypertrophy. Left ventricular diastolic Doppler  parameters are consistent with impaired  relaxation.   2. No evidence of mitral valve stenosis.   3. No stenosis of the aortic valve.   4. The aorta is normal in size and structure.   5. Grossly normal.   NM Stress 09/24/2018 The left ventricular ejection fraction is normal (55-65%). Nuclear stress EF: 61%. There was no ST segment deviation noted during stress. The study is normal. This is a low risk study.  Recent Labs: 09/18/2020: ALT 13; BUN 31; Creatinine, Ser 1.98; Hemoglobin 11.7; Platelets 329.0; Potassium 4.2; Sodium 141; TSH 2.63   Recent Lipid Panel    Component Value Date/Time   CHOL 114 05/06/2019 1222   TRIG 145 05/06/2019 1222   HDL 35 (L) 05/06/2019 1222   CHOLHDL 3.3 05/06/2019 1222   LDLCALC 54 05/06/2019 1222    Physical Exam:   VS:  BP 130/68   Pulse 95   Ht 5\' 4"  (1.626 m)   Wt 207 lb 3.2 oz (94 kg)   SpO2 98%   BMI 35.57 kg/m    Wt Readings from Last 3 Encounters:  06/03/21 207 lb 3.2 oz (94 kg)  05/30/21 205 lb (93 kg)  04/18/21 202 lb (91.6 kg)    General: Well nourished, well developed, in no acute distress Head: Atraumatic, normal size  Eyes: PEERLA, EOMI  Neck: Supple, no JVD Endocrine: No thryomegaly Cardiac: Normal S1, S2; RRR; no murmurs, rubs, or gallops Lungs: Clear to auscultation bilaterally, no wheezing,  rhonchi or rales  Abd: Soft, nontender, no hepatomegaly  Ext: Diminished pulses in the right lower extremity Musculoskeletal: No deformities, BUE and BLE strength normal and equal Skin: Warm and dry, no rashes   Neuro: Alert and oriented to person, place, time, and situation, CNII-XII grossly intact, no focal deficits  Psych: Normal mood and affect   ASSESSMENT:   Hayley Case is a 71 y.o. female who presents for the following: 1. Bilateral carotid artery stenosis   2. PAD (peripheral artery disease) ([place])   3. Essential hypertension   4. Hyperlipidemia, unspecified hyperlipidemia type     PLAN:   1. Bilateral carotid artery stenosis -Minimal disease on recent carotid ultrasound.  Continue with cholesterol reduction.  Also on aspirin.  2. PAD (peripheral artery disease) ([place]) -Hayley Case does have moderate right SFA disease as well as left popliteal disease.  Hayley Case does describe symptoms with this appears to be more consistent with arthritis.  Evaluated by vascular who recommended medical management.  We will continue aspirin as well as cholesterol-lowering agents.  See discussion below.  3. Essential hypertension -Well-controlled.  Goal LDL less 130/80.  At goal today.  No change to medications.  4. Hyperlipidemia, unspecified hyperlipidemia type -LDL 180.  On Zetia 10 mg daily.  Cannot tolerate statins.  This due to cramping.  I have recommended referral to pharmacy for PCSK9 inhibitor therapy.  Hayley Case is interested.  I believe this would be the best option to treat her vascular disease.  Disposition: Return in about 1 year (around 06/04/2022).  Medication Adjustments/Labs and Tests Ordered: Current medicines are reviewed at length with the patient today.  Concerns regarding medicines are outlined above.  Orders Placed This Encounter  Procedures   AMB Referral to [facility]   VAS  US CAROTID   No orders of the defined types were placed in this encounter.   Patient Instructions   Medication Instructions:  The current medical regimen is effective;  continue present plan and medications.  *If you need a refill on your cardiac medications before your next appointment, please call your pharmacy*   Testing/Procedures: Your physician has requested that you have a carotid duplex (12 month) . This test is an ultrasound of the carotid arteries in your neck. It looks at blood flow through these arteries that supply the brain with blood. Allow one hour for this exam. There are no restrictions or special instructions.   Follow-Up: At [facility], you and your health needs are our priority.  As part of our continuing mission to provide you with exceptional heart care, we have created designated Provider Care Teams.  These Care Teams include your primary Cardiologist (physician) and Advanced Practice Providers (APPs -  Physician Assistants and Nurse Practitioners) who all work together to provide you with the care you need, when you need it.  We recommend signing up for the patient portal called "MyChart".  Sign up information is provided on this After Visit Summary.  MyChart is used to connect with patients for Virtual Visits (Telemedicine).  Patients are able to view lab/test results, encounter notes, upcoming appointments, etc.  Non-urgent messages can be sent to your provider as well.   To learn more about what you can do with MyChart, go to [email or web address].    Your next appointment:   12 month(s)  The format for your next appointment:   In Person  Provider:   Evalina Field, MD     Other Instructions Referral to [facility]- they will contact you for an appointment.           Time Spent with Patient: I have spent a total of 35 minutes with patient reviewing hospital notes, telemetry, EKGs, labs and examining the patient as well as establishing an assessment and plan that was discussed with the patient.  > 50% of time was spent in direct patient  care.  Signed, Addison Naegeli. Audie Box, MD, [place]  [address], [place],  [address] [phone]  06/03/2021 8:26 AM

## 2021-06-03 ENCOUNTER — Ambulatory Visit (INDEPENDENT_AMBULATORY_CARE_PROVIDER_SITE_OTHER): Payer: Medicare HMO | Admitting: Cardiovascular Disease

## 2021-06-03 ENCOUNTER — Encounter: Payer: Self-pay | Admitting: Cardiovascular Disease

## 2021-06-03 VITALS — BP 130/68 | HR 95 | Ht 64.0 in | Wt 207.2 lb

## 2021-06-03 DIAGNOSIS — I739 Peripheral vascular disease, unspecified: Secondary | ICD-10-CM | POA: Diagnosis not present

## 2021-06-03 DIAGNOSIS — I6523 Occlusion and stenosis of bilateral carotid arteries: Secondary | ICD-10-CM | POA: Diagnosis not present

## 2021-06-03 DIAGNOSIS — I10 Essential (primary) hypertension: Secondary | ICD-10-CM

## 2021-06-03 DIAGNOSIS — E785 Hyperlipidemia, unspecified: Secondary | ICD-10-CM

## 2021-06-03 NOTE — Patient Instructions (Signed)
Medication Instructions:  The current medical regimen is effective;  continue present plan and medications.  *If you need a refill on your cardiac medications before your next appointment, please call your pharmacy*   Testing/Procedures: Your physician has requested that you have a carotid duplex (12 month) . This test is an ultrasound of the carotid arteries in your neck. It looks at blood flow through these arteries that supply the brain with blood. Allow one hour for this exam. There are no restrictions or special instructions.   Follow-Up: At [facility], you and your health needs are our priority.  As part of our continuing mission to provide you with exceptional heart care, we have created designated Provider Care Teams.  These Care Teams include your primary Cardiologist (physician) and Advanced Practice Providers (APPs -  Physician Assistants and Nurse Practitioners) who all work together to provide you with the care you need, when you need it.  We recommend signing up for the patient portal called "MyChart".  Sign up information is provided on this After Visit Summary.  MyChart is used to connect with patients for Virtual Visits (Telemedicine).  Patients are able to view lab/test results, encounter notes, upcoming appointments, etc.  Non-urgent messages can be sent to your provider as well.   To learn more about what you can do with MyChart, go to [email or web address].    Your next appointment:   12 month(s)  The format for your next appointment:   In Person  Provider:   Evalina Field, MD     Other Instructions Referral to [facility]- they will contact you for an appointment.

## 2021-06-11 ENCOUNTER — Encounter: Payer: Self-pay | Admitting: Pharmacist Clinician (PhC)/ Clinical Pharmacy Specialist

## 2021-06-11 ENCOUNTER — Ambulatory Visit (INDEPENDENT_AMBULATORY_CARE_PROVIDER_SITE_OTHER): Payer: Medicare HMO | Admitting: Pharmacist Clinician (PhC)/ Clinical Pharmacy Specialist

## 2021-06-11 DIAGNOSIS — E785 Hyperlipidemia, unspecified: Secondary | ICD-10-CM

## 2021-06-11 DIAGNOSIS — E1169 Type 2 diabetes mellitus with other specified complication: Secondary | ICD-10-CM

## 2021-06-11 NOTE — Assessment & Plan Note (Signed)
Patient with familial hyperlipidemia, baseline LDL at 279 (02/2019).  Unable to take statin drugs secondary to liver enzymes elevated to > 3 x UNL.

## 2021-06-11 NOTE — Progress Notes (Unsigned)
06/12/2021 Hayley Case April 12, 1950 [id number]   HPI:  Hayley Case is a 71 y.o. female patient of Dr Audie Box, who presents today for a lipid clinic evaluation.  See pertinent past medical history below.  Patient moved from [place] to the [place] area several years ago to help her son who does home dialysis.  She had previously taken atorvastatin but had to discontinue after her liver enzymes increased by more than 3x UNL.  She is currently on ezetimibe, but because patient has history of familial hyperlipidemia, we know that she will not get to LDL goal on this alone.   Past Medical History: ASCVD Minimal carotid disease on ultrasound, moderate SFA and left popliteal stenosis  DM2 12/22 A1c 7.9 on glargine, humalog and Rybelsus  CKD 9/22 Scr 1.98, GFR 25.3  hypertension Controlled on amlodipine, losartan      Current Medications: ezetimibe 10  Cholesterol Goals:  LDL < 70   Intolerant/previously tried: rosuvastatin, atorvastatin - muscle cramping  Family history: father died in his late 39's from MI; mother died from complications of anesthesia, early 38's; one sister with stent; one brother with pacemaker; 69 still living; 4 children, 1 son died from MI in his 15's; another son on home dialysis - htn, hld,; daughter has hypertension  Diet: eats out some, (admits to liking the convenience of [address] [place]) tries to eat based on son diet; tries to have colorful vegetables with each meal; admits weakness is chocolate and sweet tea  Exercise:  tries to walk some  Labs:  4/23 TC 270, TG 172, HL 59, LDL 180  02/2019 LDL 279   Current Outpatient Medications  Medication Sig Dispense Refill   ACETAMINOPHEN PO Take 650 mg by mouth every 6 (six) hours as needed for headache or moderate pain.     albuterol (VENTOLIN HFA) 108 (90 Base) MCG/ACT inhaler Inhale 2 puffs into the lungs every 6 (six) hours as needed for wheezing or shortness of breath. 1 g 3   allopurinol (ZYLOPRIM) 100 MG tablet  Take 100 mg by mouth daily.     amLODipine (NORVASC) 10 MG tablet TAKE 1 TABLET BY MOUTH DAILY 90 tablet 3   Biotin 5000 MCG CAPS Take 1 capsule by mouth daily.     Carboxymethylcellulose Sodium (REFRESH TEARS OP) Place 1 drop into both eyes at bedtime.      cholecalciferol (VITAMIN D3) 25 MCG (1000 UNIT) tablet Take 2,000 Units by mouth daily.     Continuous Blood Gluc Sensor (FREESTYLE LIBRE 2 SENSOR) MISC Inject 1 Device into the skin as directed. Use as directed every 14 days. E11.21 12 each 3   Docusate Sodium (COLACE PO) Take 1 capsule by mouth daily.     ezetimibe (ZETIA) 10 MG tablet Take 1 tablet (10 mg total) by mouth daily. 90 tablet 1   fluocinonide (LIDEX) 0.05 % external solution Apply topically in the morning and at bedtime.     insulin glargine, 2 Unit Dial, (TOUJEO MAX SOLOSTAR) 300 UNIT/ML Solostar Pen Inject 36 Units into the skin daily. 15 mL 6   insulin lispro (HUMALOG KWIKPEN) 200 UNIT/ML KwikPen Inject 12 Units into the skin with breakfast, with lunch, and with evening meal. Max daily 80 units with correction scale 45 mL 3   Insulin Pen Needle (PEN NEEDLES) 32G X 4 MM MISC 1 Device by Does not apply route in the morning, at noon, in the evening, and at bedtime. 400 each 3   loratadine (CLARITIN) 10 MG tablet  Take 10 mg by mouth daily as needed (seasonal allergies).     losartan (COZAAR) 50 MG tablet Take 1 tablet (50 mg total) by mouth daily. 90 tablet 0   RYBELSUS 7 MG TABS TAKE 1 TABLET BY MOUTH DAILY AT 2 PM 90 tablet 3   vitamin B-12 (CYANOCOBALAMIN) 1000 MCG tablet Take 1,000 mcg by mouth daily.     No current facility-administered medications for this visit.    Allergies  Allergen Reactions   Lisinopril Cough   Atorvastatin Other (See Comments)    Liver function test elevation Other reaction(s): liver issues   Penicillins Hives and Itching    Did it involve swelling of the face/tongue/throat, SOB, or low BP? No Did it involve sudden or severe rash/hives, skin  peeling, or any reaction on the inside of your mouth or nose? Yes Did you need to seek medical attention at a hospital or doctor's office? Yes When did it last happen?      2012 If all above answers are "NO", may proceed with cephalosporin use.   Rosuvastatin Other (See Comments)    Myalgias / leg cramps   Sulfamethoxazole-Trimethoprim Hives    Other reaction(s): Unknown   Penicillin G     Other reaction(s): Unknown    Past Medical History:  Diagnosis Date   CKD (chronic kidney disease)    Diabetes mellitus without complication ([facility])    Hyperlipidemia    Hypertension     Blood pressure (!) 150/86, pulse 95, resp. rate 18, height 5\' 4"  (1.626 m), weight 206 lb (93.4 kg), SpO2 99 %.   Hyperlipidemia associated with type 2 diabetes mellitus ([place]) Patient with familial hyperlipidemia, baseline LDL at 279 (02/2019).  Unable to take statin drugs secondary to liver enzymes elevated to > 3 x UNL.    Dyslipidemia Patient with familial hyperlipidemia and ASCVD, not to LDL goal and unable to take statin drugs secondary to elevation in liver enzymes to more than 3x UNL. Currently on ezetimibe 10 mg, however this will not get her to goal with LDL at 180.   Reviewed options for lowering LDL cholesterol, including PCSK-9 inhibitors, bempedoic acid and inclisiran.  Discussed mechanisms of action, dosing, side effects and potential decreases in LDL cholesterol.  Also reviewed cost information and potential options for patient assistance.  Answered all patient questions.  Based on this information, patient would prefer to start PCSK9 inhibitor.  Her insurance preference is for Praluent, so will start with the 150 mg dose q14d.  She will need to repeat labs (lipid and liver function) in 3 months.   Tommy Medal PharmD CPP [facility] [address] [place], [place] [address] [phone]

## 2021-06-11 NOTE — Patient Instructions (Signed)
Your Results:             Your most recent labs Goal  Total Cholesterol 270 < 200  Triglycerides 172 < 150  HDL (happy/good cholesterol) 59 > 40  LDL (lousy/bad cholesterol 180 < 55   Medication changes:  We will start the process to get Praluent covered by your insurance.  Once the prior authorization is complete, [facility]ville Silos will call you to let you know and confirm pharmacy information.   You will take one injection every 2 weeks.    Lab orders:  We want to repeat labs after 2-3 months.  We will send you a lab order to remind you once we get closer to that time.    Patient Assistance:  The [employer] foundation offers assistance to help pay for medication copays.  They will cover copays for all cholesterol lowering meds, including statins, fibrates, omega-3 oils, ezetimibe, Repatha, Praluent, Nexletol, Nexlizet.  The cards are usually good for $2,500 or 12 months, whichever comes first. Go to healthwellfoundation.org Click on "Apply Now" Answer questions as to whom is applying (patient or representative) Your disease fund will be "hypercholesterolemia - Medicare access" They will ask questions about finances and which medications you are taking for cholesterol When you submit, the approval is usually within minutes.  You will need to print the card information from the site You will need to show this information to your pharmacy, they will bill your Medicare Part D plan first -then bill [employer] --for the copay.   You can also call them at [phone], although the hold times can be quite long.   Thank you for choosing [facility]

## 2021-06-12 ENCOUNTER — Other Ambulatory Visit: Payer: Self-pay | Admitting: Pharmacist Clinician (PhC)/ Clinical Pharmacy Specialist

## 2021-06-12 DIAGNOSIS — E119 Type 2 diabetes mellitus without complications: Secondary | ICD-10-CM

## 2021-06-12 DIAGNOSIS — Z794 Long term (current) use of insulin: Secondary | ICD-10-CM | POA: Diagnosis not present

## 2021-06-12 DIAGNOSIS — E785 Hyperlipidemia, unspecified: Secondary | ICD-10-CM

## 2021-06-12 DIAGNOSIS — I10 Essential (primary) hypertension: Secondary | ICD-10-CM | POA: Diagnosis not present

## 2021-06-12 DIAGNOSIS — E1122 Type 2 diabetes mellitus with diabetic chronic kidney disease: Secondary | ICD-10-CM

## 2021-06-12 MED ORDER — PRALUENT 150 MG/ML ~~LOC~~ SOAJ: 150.0000 mg | SUBCUTANEOUS | 12 refills | Status: DC

## 2021-06-12 NOTE — Telephone Encounter (Signed)
Praluent approved.  Insurance may switch to Repatha 140 mg on July 1.  Will monitor. Repeat labs in 3 months

## 2021-06-12 NOTE — Assessment & Plan Note (Addendum)
Patient with familial hyperlipidemia and ASCVD, not to LDL goal and unable to take statin drugs secondary to elevation in liver enzymes to more than 3x UNL. Currently on ezetimibe 10 mg, however this will not get her to goal with LDL at 180.   Reviewed options for lowering LDL cholesterol, including PCSK-9 inhibitors, bempedoic acid and inclisiran.  Discussed mechanisms of action, dosing, side effects and potential decreases in LDL cholesterol.  Also reviewed cost information and potential options for patient assistance.  Answered all patient questions.  Based on this information, patient would prefer to start PCSK9 inhibitor.  Her insurance preference is for Praluent, so will start with the 150 mg dose q14d.  She will need to repeat labs (lipid and liver function) in 3 months.

## 2021-06-18 ENCOUNTER — Ambulatory Visit: Payer: Medicare HMO | Admitting: Psychology

## 2021-06-21 ENCOUNTER — Other Ambulatory Visit (HOSPITAL_COMMUNITY)
Admission: RE | Admit: 2021-06-21 | Discharge: 2021-06-21 | Disposition: A | Discharge status: Home / Self Care | Payer: Medicare HMO | Source: Ambulatory Visit | Attending: Family | Admitting: Family

## 2021-06-21 ENCOUNTER — Ambulatory Visit (INDEPENDENT_AMBULATORY_CARE_PROVIDER_SITE_OTHER): Payer: Medicare HMO | Admitting: Family

## 2021-06-21 ENCOUNTER — Encounter: Payer: Self-pay | Admitting: Family

## 2021-06-21 VITALS — BP 136/78 | HR 90 | Resp 20 | Ht 64.0 in | Wt 204.0 lb

## 2021-06-21 DIAGNOSIS — R300 Dysuria: Secondary | ICD-10-CM

## 2021-06-21 DIAGNOSIS — N760 Acute vaginitis: Secondary | ICD-10-CM

## 2021-06-21 DIAGNOSIS — I10 Essential (primary) hypertension: Secondary | ICD-10-CM | POA: Diagnosis not present

## 2021-06-21 DIAGNOSIS — N1832 Chronic kidney disease, stage 3b: Secondary | ICD-10-CM

## 2021-06-21 DIAGNOSIS — Z794 Long term (current) use of insulin: Secondary | ICD-10-CM

## 2021-06-21 DIAGNOSIS — E1122 Type 2 diabetes mellitus with diabetic chronic kidney disease: Secondary | ICD-10-CM | POA: Diagnosis not present

## 2021-06-21 DIAGNOSIS — E785 Hyperlipidemia, unspecified: Secondary | ICD-10-CM

## 2021-06-21 MED ORDER — LOSARTAN POTASSIUM 50 MG PO TABS: 50.0000 mg | ORAL_TABLET | Freq: Every day | ORAL | 3 refills | Status: DC

## 2021-06-21 MED ORDER — PEN NEEDLES 32G X 4 MM MISC: 1.0000 | Freq: Four times a day (QID) | 3 refills | Status: DC

## 2021-06-21 NOTE — Progress Notes (Signed)
Hayley Case is a 71 y.o. female with the following history as recorded in Epic[facility]:  Patient Active Problem List   Diagnosis Date Noted   Arthritis of hip 04/18/2021   Background diabetic retinopathy ([place]) 04/18/2021   Chronic kidney disease, stage 3b ([place]) 04/18/2021   Dyslipidemia 04/18/2021   Family history of malignant neoplasm of digestive organs 04/18/2021   Gouty arthritis 04/18/2021   Hearing loss 04/18/2021   Hyperglycemia due to type 2 diabetes mellitus ([place]) 04/18/2021   Long term (current) use of insulin ([place]) 04/18/2021   Obstructive sleep apnea (adult) (pediatric) 04/18/2021   Peripheral arterial disease ([place]) 04/18/2021   Personal history of colonic polyps 04/18/2021   Flank pain 10/14/2019   Type 2 diabetes mellitus with diabetic polyneuropathy, with long-term current use of insulin ([place]) 12/01/2018   Type 2 diabetes mellitus with retinopathy, with long-term current use of insulin ([place]) 12/01/2018   Type 2 diabetes mellitus with stage 3b chronic kidney disease, with long-term current use of insulin ([place]) 11/30/2018   Non-intractable vomiting    Acute hepatitis 07/13/2018   Acute on chronic renal insufficiency 07/12/2018   Diabetes mellitus ([place]) 12/08/2016   Essential hypertension 12/08/2016   Hyperlipidemia associated with type 2 diabetes mellitus ([place]) 12/08/2016    Current Outpatient Medications  Medication Sig Dispense Refill   ACETAMINOPHEN PO Take 650 mg by mouth every 6 (six) hours as needed for headache or moderate pain.     albuterol (VENTOLIN HFA) 108 (90 Base) MCG/ACT inhaler Inhale 2 puffs into the lungs every 6 (six) hours as needed for wheezing or shortness of breath. 1 g 3   Alirocumab (PRALUENT) 150 MG/ML SOAJ Inject 150 mg into the skin every 14 (fourteen) days. 2 mL 12   allopurinol (ZYLOPRIM) 100 MG tablet Take 100 mg by mouth daily.     amLODipine (NORVASC) 10 MG tablet TAKE 1 TABLET BY MOUTH DAILY 90 tablet 3   Biotin 5000 MCG CAPS Take 1  capsule by mouth daily.     Carboxymethylcellulose Sodium (REFRESH TEARS OP) Place 1 drop into both eyes at bedtime.      cholecalciferol (VITAMIN D3) 25 MCG (1000 UNIT) tablet Take 2,000 Units by mouth daily.     Continuous Blood Gluc Sensor (FREESTYLE LIBRE 2 SENSOR) MISC Inject 1 Device into the skin as directed. Use as directed every 14 days. E11.21 12 each 3   Docusate Sodium (COLACE PO) Take 1 capsule by mouth daily.     ezetimibe (ZETIA) 10 MG tablet Take 1 tablet (10 mg total) by mouth daily. 90 tablet 1   fluocinonide (LIDEX) 0.05 % external solution Apply topically in the morning and at bedtime.     insulin glargine, 2 Unit Dial, (TOUJEO MAX SOLOSTAR) 300 UNIT/ML Solostar Pen Inject 36 Units into the skin daily. 15 mL 6   insulin lispro (HUMALOG KWIKPEN) 200 UNIT/ML KwikPen Inject 12 Units into the skin with breakfast, with lunch, and with evening meal. Max daily 80 units with correction scale 45 mL 3   loratadine (CLARITIN) 10 MG tablet Take 10 mg by mouth daily as needed (seasonal allergies).     RYBELSUS 7 MG TABS TAKE 1 TABLET BY MOUTH DAILY AT 2 PM 90 tablet 3   vitamin B-12 (CYANOCOBALAMIN) 1000 MCG tablet Take 1,000 mcg by mouth daily.     Insulin Pen Needle (PEN NEEDLES) 32G X 4 MM MISC 1 Device by Does not apply route in the morning, at noon, in the evening, and at  bedtime. 400 each 3   losartan (COZAAR) 50 MG tablet Take 1 tablet (50 mg total) by mouth daily. 90 tablet 3   No current facility-administered medications for this visit.    Allergies: Lisinopril, Atorvastatin, Penicillins, Rosuvastatin, Sulfamethoxazole-trimethoprim, and Penicillin g  Past Medical History:  Diagnosis Date   CKD (chronic kidney disease)    Diabetes mellitus without complication ([place])    Hyperlipidemia    Hypertension     Past Surgical History:  Procedure Laterality Date   BREAST BIOPSY     BREAST EXCISIONAL BIOPSY Left     Family History  Problem Relation Age of Onset   Skin cancer  Mother    Heart attack Father    Colon cancer Sister     Social History   Tobacco Use   Smoking status: Never   Smokeless tobacco: Never  Substance Use Topics   Alcohol use: No    Subjective:  Follow up on chronic care needs:  1) Cardiology is managing cholesterol medications- doing much better since stopping statin; 2) Does see nephrology every 6 months; saw that office last month and had labs drawn there; 3) Scheduled to see endocrine later this month;  4) Was recently treated for UTI and yeast infection- concerned that has no cleared completely; still having some burning with urination;    Objective:  Vitals:   06/21/21 1004  BP: 136/78  Pulse: 90  Resp: 20  SpO2: 98%  Weight: 204 lb (92.5 kg)  Height: 5\' 4"  (1.626 m)    General: Well developed, well nourished, in no acute distress  Skin : Warm and dry.  Head: Normocephalic and atraumatic  Lungs: Respirations unlabored; clear to auscultation bilaterally without wheeze, rales, rhonchi  CVS exam: normal rate and regular rhythm.  Abdomen: Soft; nontender; nondistended; normoactive bowel sounds; no masses or hepatosplenomegaly  Musculoskeletal: No deformities; no active joint inflammation  Extremities: No edema, cyanosis, clubbing  Vessels: Symmetric bilaterally  Neurologic: Alert and oriented; speech intact; face symmetrical; moves all extremities well; CNII-XII intact without focal deficit  Assessment:  1. Acute vaginitis   2. Dysuria   3. Essential hypertension   4. Type 2 diabetes mellitus with chronic kidney disease, with long-term current use of insulin, unspecified CKD stage ([place])   5. Chronic kidney disease, stage 3b ([place])   6. Dyslipidemia     Plan:  & 2. Check vaginal swab/ urine culture; follow up to be determined; 3.   Stable; refill updated; 4.   Stressed need to keep follow up with endocrine for later this month; 5.   Continue with nephrology as scheduled; 6.   Has recently started Praluent-  tolerating well; keep planned follow up with cardiology;   No follow-ups on file.  Orders Placed This Encounter  Procedures   Urine Culture    Requested Prescriptions   Signed Prescriptions Disp Refills   Insulin Pen Needle (PEN NEEDLES) 32G X 4 MM MISC 400 each 3    Sig: 1 Device by Does not apply route in the morning, at noon, in the evening, and at bedtime.   losartan (COZAAR) 50 MG tablet 90 tablet 3    Sig: Take 1 tablet (50 mg total) by mouth daily.

## 2021-06-22 LAB — URINE CULTURE
MICRO NUMBER:: 13506323
SPECIMEN QUALITY:: ADEQUATE

## 2021-06-24 LAB — CERVICOVAGINAL ANCILLARY ONLY
Bacterial Vaginitis (gardnerella): NEGATIVE
Candida Glabrata: NEGATIVE
Candida Vaginitis: NEGATIVE
Comment: NEGATIVE
Comment: NEGATIVE
Comment: NEGATIVE

## 2021-06-27 ENCOUNTER — Ambulatory Visit (INDEPENDENT_AMBULATORY_CARE_PROVIDER_SITE_OTHER): Payer: Medicare HMO | Admitting: Pharmacist

## 2021-06-27 DIAGNOSIS — N1832 Chronic kidney disease, stage 3b: Secondary | ICD-10-CM

## 2021-06-27 DIAGNOSIS — E785 Hyperlipidemia, unspecified: Secondary | ICD-10-CM

## 2021-06-27 DIAGNOSIS — I10 Essential (primary) hypertension: Secondary | ICD-10-CM

## 2021-06-27 DIAGNOSIS — E1122 Type 2 diabetes mellitus with diabetic chronic kidney disease: Secondary | ICD-10-CM

## 2021-06-27 NOTE — Patient Instructions (Signed)
Mrs. Wisser It was a pleasure speaking with you  Below is a summary of your health goals and care plan  Patient Goals/Self-Care Activities Over the next 90 days, patient will:  take medications as prescribed,  check glucose using Continuous Glucose Monitor prior to every meal and as needed, document, and provide at future appointments,  check blood pressure 1 to 2 times per week (after purchases home blood pressure cuff), document, and provide at future appointments,  collaborate with provider on medication access solutions  Follow Up Plan: Telephone follow up appointment with care management team member scheduled for:  6 weeks.     If you have any questions or concerns, please feel free to contact me either at the phone number below or with a MyChart message.   Keep up the good work!  Cherre Robins, PharmD Clinical Pharmacist [facility] [phone] (direct line)  [phone] (main office number)   Patient verbalizes understanding of instructions and care plan provided today and agrees to view in [facility]. Active MyChart status and patient understanding of how to access instructions and care plan via MyChart confirmed with patient.

## 2021-06-27 NOTE — Chronic Care Management (AMB) (Addendum)
Chronic Care Management Pharmacy Note  06/27/2021 Name:  Hayley Case MRN:  [id number] DOB:  Dec 20, 1950  Summary: Medication Management: Patient reported that cost for Medications was high. Discussed today and highest cost medications are Toujeo, Rybelsus and Humalog. All 3 medications are $75 copay per 90 days per patient. Screened for LIS / Medicare Extra Help and medication assistance programs with manufacturers at previous visit but patient's yearly income was too high (has Medicare + pension).  Diabetes: Discussed medications and indications. She is seeing Dr Kelton Pillar 07/02/2021. Patient did report that blood glucose is low in mornings (59 to 100) and she usually skips morning Humalog dose. Blood glucose usually higher in afternoon and evening 200 to 250. Current therapy is Rybelsus 74m daily; Toujeo 36 units at bedtime and Humalog 12 units with meals + additional as needed to correct blood glucose. She will discuss blood glucose with Dr SKelton Pillarnext week. Possibly needs adjustment of Toujeo dose lower to prevent am lows and increase in either Rybelsus or Humalog to better cover meals. She reports that she gets an error message on her phone that "LElenor Legatoapp is running behind" and she worried that her sensor is not working properly. I asked if possibly the message was that the "LElenor Legatoapp is running in the background" she endorsed that it was that alert. Explained that this just means that the app is running on her phone in the background and the it should do this. Running in the back ground will allow her to get recent blood glucose readings and alerts if blood glucose drops.  Hyperlipidemia: LDL goal per cardiology offie if < 55. She was unable to tolerate atorvastatin or rosuvastatin. Started Praluent 1526mevery 14 days 06/17/2021. Next dose if 07/01/2021. Continues to take ezetimibe 1034maily. Hypertension: last blood pressure was elevated above goal of < 130/80 (CKD) . But blood  pressure usually at goal. Patient states she is not checking blood pressure at home. Does not have blood pressure cuff. She reports that her [employer] plan does not have over-the-counter benefits to purchase blood pressure monitor.    Subjective: Hayley Case an [age]. year old female who is a primary patient of MurMarrian SalvageNPE[place]The CCM team was consulted for assistance with disease management and care coordination needs.    Engaged with patient by telephone for follow up visit in response to provider referral for pharmacy case management and/or care coordination services.   Consent to Services:  The patient was given information about Chronic Care Management services, agreed to services, and gave verbal consent prior to initiation of services.  Please see initial visit note for detailed documentation.   Patient Care Team: MurMarrian SalvageNPHaleiwa PCP - General (Internal Medicine) O'Neal, WesCassie FreerD as PCP - Cardiology (Cardiology) EckCherre RobinsPHPiquaharmacist)  Recent office visits: 06/21/2021 - FamHeriberto Antiguad (MuValere DrossP) Seen for acute vaginitis. Checked vaginal swab and urine culture. Both were clear. Recommended stopping coconut oil to see if vaginitis improved.  10/19/2020 - Int Med (MuValere DrossP) Seen for back pain and myalgias. Crestor 38m75mopped at last visit and myalgias improving. Prescribed lower dose of Crestor 20mg36mly, continue ezetimibe. Ordered MRI of lumbar spine.   Recent consult visits: 06/11/2021 - Cardio ([place], Rph,CPP) seen for initiation of PCSK9. Started Praluent 150mg 85mr 14 days. Repeat lipids / LFTs in 3 months. LDL goal < 55.  06/03/2021 - Cardio (Dr O'NealAudie BoxCAD and hyperlipidemia. Patient  retried rosuvastatin but could not tolerate. Referred to cardio pharmacist to initiate PCSK9 agent. F/U in 1 year.  05/30/2021 - GYN (Dr Nehemiah Settle) Seen for vaginal irritation. Prescribed diflucan 140m every 3 days for 3 doses. Can use  coconut oil to outside of vagina as needed. F/U 2 to 3 months.  03/19/2021 - Cardio (Dr AFletcher Anon Seen for PAD. No med changes noted. Has been intolerate to statin in past. On ezetimibe, if not able to get LDL to < 70 , then consider PCSK9. 12/25/2020 - Endo (Dr SKelton Pillar F/U Diabetes. Lowered dose of Toujeo to 32 units caily. Continue Humalog 12 units with correction factor with each meal and continue Rybelsus 773mwiht breakfast.   Hospital visits: None in previous 6 months  Objective:  Lab Results  Component Value Date   CREATININE 1.98 (H) 09/18/2020   CREATININE 2.17 (H) 05/06/2019   CREATININE 1.86 (H) 07/28/2018    Lab Results  Component Value Date   HGBA1C 7.9 (A) 12/25/2020   Last diabetic Eye exam:  Lab Results  Component Value Date/Time   HMDIABEYEEXA No Retinopathy 12/16/2018 02:40 PM    Last diabetic Foot exam: No results found for: "HMDIABFOOTEX"      Component Value Date/Time   CHOL 114 05/06/2019 1222   TRIG 145 05/06/2019 1222   HDL 35 (L) 05/06/2019 1222   CHOLHDL 3.3 05/06/2019 1222   LDLCALC 54 05/06/2019 1222       Latest Ref Rng & Units 09/18/2020    9:53 AM 05/06/2019   12:22 PM 07/28/2018    4:43 PM  Hepatic Function  Total Protein 6.0 - 8.3 g/dL 6.8  6.5  6.8   Albumin 3.5 - 5.2 g/dL 3.6  3.6  3.7   AST 0 - 37 U/L 13  17  172   ALT 0 - 35 U/L 13  26  295   Alk Phosphatase 39 - 117 U/L 105  212  527   Total Bilirubin 0.2 - 1.2 mg/dL 0.6  0.5  0.8     Lab Results  Component Value Date/Time   TSH 2.63 09/18/2020 09:53 AM       Latest Ref Rng & Units 09/18/2020    9:53 AM 07/28/2018    4:43 PM 07/14/2018    5:53 AM  CBC  WBC 4.0 - 10.5 K/uL 9.4  6.1  4.8   Hemoglobin 12.0 - 15.0 g/dL 11.7  12.2  11.3   Hematocrit 36.0 - 46.0 % 34.2  36.7  34.2   Platelets 150.0 - 400.0 K/uL 329.0  232  189     Lab Results  Component Value Date/Time   VD25OH 21.3 (L) 05/18/2018 02:46 PM   VD25OH 16.7 (L) 11/13/2017 11:36 AM    Clinical ASCVD: Yes  The  ASCVD Risk score (Arnett DK, et al., 2019) failed to calculate for the following reasons:   The valid total cholesterol range is 130 to 320 mg/dL     Social History   Tobacco Use  Smoking Status Never  Smokeless Tobacco Never   BP Readings from Last 3 Encounters:  06/21/21 136/78  06/11/21 (!) 150/86  06/03/21 130/68   Pulse Readings from Last 3 Encounters:  06/21/21 90  06/11/21 95  06/03/21 95   Wt Readings from Last 3 Encounters:  06/21/21 204 lb (92.5 kg)  06/11/21 206 lb (93.4 kg)  06/03/21 207 lb 3.2 oz (94 kg)    Assessment: Review of patient past medical history, allergies, medications, health status, including  review of consultants reports, laboratory and other test data, was performed as part of comprehensive evaluation and provision of chronic care management services.   SDOH:  (Social Determinants of Health) assessments and interventions performed:    CCM Care Plan  Allergies  Allergen Reactions   Lisinopril Cough   Atorvastatin Other (See Comments)    Liver function test elevation Other reaction(s): liver issues   Penicillins Hives and Itching    Did it involve swelling of the face/tongue/throat, SOB, or low BP? No Did it involve sudden or severe rash/hives, skin peeling, or any reaction on the inside of your mouth or nose? Yes Did you need to seek medical attention at a hospital or doctor's office? Yes When did it last happen?      2012 If all above answers are "NO", may proceed with cephalosporin use.   Rosuvastatin Other (See Comments)    Myalgias / leg cramps   Sulfamethoxazole-Trimethoprim Hives    Other reaction(s): Unknown   Penicillin G     Other reaction(s): Unknown    Medications Reviewed Today     Reviewed by Cherre Robins, RPH-CPP (Pharmacist) on 06/27/21 at 83  Med List Status: <None>   Medication Order Taking? Sig Documenting Provider Last Dose Status Informant  ACETAMINOPHEN PO [id number] Yes Take 650 mg by mouth every 6 (six)  hours as needed for headache or moderate pain. [provider] Taking Active Self  albuterol (VENTOLIN HFA) 108 (90 Base) MCG/ACT inhaler [id number] Yes Inhale 2 puffs into the lungs every 6 (six) hours as needed for wheezing or shortness of breath. Geralynn Rile, MD Taking Active   Alirocumab (PRALUENT) 150 MG/ML Darden Palmer [id number] Yes Inject 150 mg into the skin every 14 (fourteen) days. Geralynn Rile, MD Taking Active   allopurinol (ZYLOPRIM) 100 MG tablet [id number] Yes Take 100 mg by mouth daily. [provider] Taking Active   amLODipine (NORVASC) 10 MG tablet [id number] Yes TAKE 1 TABLET BY MOUTH DAILY O'Neal, Cassie Freer, MD Taking Active   Biotin 5000 MCG CAPS [id number] Yes Take 1 capsule by mouth daily. [provider] Taking Active Self  Carboxymethylcellulose Sodium (REFRESH TEARS OP) [id number] Yes Place 1 drop into both eyes at bedtime.  [provider] Taking Active Multiple Informants  cholecalciferol (VITAMIN D3) 25 MCG (1000 UNIT) tablet [id number] Yes Take 2,000 Units by mouth daily. [provider] Taking Active Self  Continuous Blood Gluc Sensor (FREESTYLE LIBRE 2 SENSOR) MISC [id number] Yes Inject 1 Device into the skin as directed. Use as directed every 14 days. E11.21 Shamleffer, Melanie Crazier, MD Taking Active   Docusate Sodium (COLACE PO) [id number] Yes Take 1 capsule by mouth daily. [provider] Taking Active   ezetimibe (ZETIA) 10 MG tablet [id number] Yes Take 1 tablet (10 mg total) by mouth daily. Geralynn Rile, MD Taking Active   fluocinonide (LIDEX) 0.05 % external solution [id number] Yes Apply topically in the morning and at bedtime. [provider] Taking Active   insulin glargine, 2 Unit Dial, (TOUJEO MAX SOLOSTAR) 300 UNIT/ML Solostar Pen [id number] Yes Inject 36 Units into the skin daily. Shamleffer, Melanie Crazier, MD Taking Active   insulin lispro (HUMALOG KWIKPEN) 200 UNIT/ML KwikPen  [id number] Yes Inject 12 Units into the skin with breakfast, with lunch, and with evening meal. Max daily 80 units with correction scale Shamleffer, Melanie Crazier, MD Taking Active   Insulin Pen Needle (PEN NEEDLES) 32G X 4 MM MISC [id number] Yes 1 Device by Does not apply route  in the morning, at noon, in the evening, and at bedtime. Marrian Salvage, FNP Taking Active   loratadine (CLARITIN) 10 MG tablet [id number] Yes Take 10 mg by mouth daily as needed (seasonal allergies). [provider] Taking Active Self  losartan (COZAAR) 50 MG tablet [id number] Yes Take 1 tablet (50 mg total) by mouth daily. Marrian Salvage, FNP Taking Active   RYBELSUS 7 MG TABS [id number] Yes TAKE 1 TABLET BY MOUTH DAILY AT 2 PM Shamleffer, Melanie Crazier, MD Taking Active   vitamin B-12 (CYANOCOBALAMIN) 1000 MCG tablet [id number] Yes Take 1,000 mcg by mouth daily. [provider] Taking Active             Patient Active Problem List   Diagnosis Date Noted   Arthritis of hip 04/18/2021   Background diabetic retinopathy ([place]) 04/18/2021   Chronic kidney disease, stage 3b ([place]) 04/18/2021   Dyslipidemia 04/18/2021   Family history of malignant neoplasm of digestive organs 04/18/2021   Gouty arthritis 04/18/2021   Hearing loss 04/18/2021   Hyperglycemia due to type 2 diabetes mellitus ([place]) 04/18/2021   Long term (current) use of insulin ([place]) 04/18/2021   Obstructive sleep apnea (adult) (pediatric) 04/18/2021   Peripheral arterial disease ([place]) 04/18/2021   Personal history of colonic polyps 04/18/2021   Flank pain 10/14/2019   Type 2 diabetes mellitus with diabetic polyneuropathy, with long-term current use of insulin ([place]) 12/01/2018   Type 2 diabetes mellitus with retinopathy, with long-term current use of insulin ([place]) 12/01/2018   Type 2 diabetes mellitus with stage 3b chronic kidney disease, with long-term current use of insulin ([place]) 11/30/2018   Non-intractable vomiting     Acute hepatitis 07/13/2018   Acute on chronic renal insufficiency 07/12/2018   Diabetes mellitus ([place]) 12/08/2016   Essential hypertension 12/08/2016   Hyperlipidemia associated with type 2 diabetes mellitus ([place]) 12/08/2016    Immunization History  Administered Date(s) Administered   Fluad Quad(high Dose 65+) 10/19/2020   Influenza Split 12/17/2018, 12/23/2018   Influenza, High Dose Seasonal PF 12/16/2019   Influenza,inj,Quad PF,6+ Mos 11/13/2017   Influenza-Unspecified 12/17/2018   PFIZER Comirnaty(Gray Top)Covid-19 Tri-Sucrose Vaccine 11/18/2019   PFIZER(Purple Top)SARS-COV-2 Vaccination 03/11/2019, 04/05/2019, 11/18/2019   Pneumococcal Polysaccharide-23 12/23/2018   Tdap 07/27/2019   Zoster, Live 07/27/2019, 12/01/2019    Conditions to be addressed/monitored: HTN, HLD, ESRD, and CKD Stage 4  Care Plan : General Pharmacy (Adult)  Updates made by Cherre Robins, RPH-CPP since 06/27/2021 12:00 AM     Problem: Chronic Conditions: hypertension, hyperlipidemia, diabetes, decrease in kidney function   Priority: High  Onset Date: 05/15/2021     Long-Range Goal: Provide education, support and care coordination for medication therapy and chronic conditions   Start Date: 05/15/2021  Priority: High  Note:   Current Barriers:  Unable to independently afford treatment regimen Unable to independently monitor therapeutic efficacy Unable to maintain control of hypertension and hyperlipidemia  Pharmacist Clinical Goal(s):  Over the next 90 days, patient will verbalize ability to afford treatment regimen achieve control of hypertension and HDL as evidenced by blood pressure < 130/80 and LDL < 70 adhere to plan to optimize therapeutic regimen for type 2 diabetes and HDL as evidenced by report of adherence to recommended medication management changes through collaboration with PharmD and provider.   Interventions: 1:1 collaboration with Marrian Salvage, [place] regarding development and  update of comprehensive plan of care as evidenced by provider attestation and co-signature Inter-disciplinary care team collaboration (see longitudinal plan of care) Comprehensive  medication review performed; medication list updated in electronic medical record   Diabetes:   Not at goal; A1c goal < 7.5%.  Se[facility] Dr [facility]ton [facility] Current treatment: Rybelsus 52m daily  Toujeo Max Pen 300u/ mL - inject 36 units daily Humalog U200/mL - inject 12 units + additional correction amounts prior to each meal based on blood glucose.  Freestyle Libre 2 Sensor - Continuous Glucose Monitor  Denies hypoglycemic/hyperglycemic symptoms Reports low blood glucose in am (59 to 100) and usually skips Humalog dose with breakfast; blood glucose raises later in day to 200 to 250 Interventions:  Reviewed home blood glucose readings and reviewed goals  Fasting blood glucose goal (before meals) = 80 to 130 Blood glucose goal after a meal = less than 180  Discussed hypoglycemia and reviewed how to treat and prevent. Patient to see endocrinologist in 5 days to discuss medication changes if needed.  Reviewed for medication assistance program for Ryblesus, Toujeo and Humalog (See below - medication manaagement) Reminded patient of upcoming endocrinology appointment. Informed patient that upcoming appointment will be at main [place] and not the Primary care office this time.   Hyperlipidemia:  LDL goal of < 55  She has not taken statin in several months due to rosuvastatin 428mcausing myalgias and atorvastatin causing increased LFTs in the past.  Patient retried rosuvastatin but was not able to tolerated. Praluent started with first dose 06/17/2021 Current treatment:  ezetimibe 1017maily. Praulent 150m84mery 14 days.   Interventions:  Continue ezetimibe and Praluent 150mg79mry 14 days Recheck lipids and LFTs in 1 to 2 months.   Hypertension:  last blood pressure was elevated  above goal of < 130/80 (CKD) . But blood pressure usually at goal.  Current treatment:  Amlodipine 10mg 28my  Losartan 50mg d51m Patient states she is not checking blood pressure at home. Does not have blood pressure cuff.  Interventions:  Discussed purchasing blood pressure cuff with over-the-counter benefits thru [employer].  Start checking blood pressure 1 to 2 times per week, record and provide at future appointments.  Reviewed goal blood pressure of < 130/80 Limit intake of sodium to less than 2300mg of58mium per day.    Medication Management:  Patient reported that cost for Medications was high. Discussed today and highest cost medications are Toujeo, Rybelsus and Humalog.   Goal: work with PharmD and providers to maintain optimal medication adherence and when able identify opportunities to lower medication cost. Current pharmacy: [facility]  Interventions:  All 3 medications - Toujeo, Rybelsus and Humalog - are $75 copay per 90 days per patient. Screened for LIS / Medicare Extra Help and also for medication assistance programs with manufacturers but patient's yearly income likely will not meet eligibility criteria. Mailed application for [employer] at last but she has not completed - states she does not think she will qualify.  Reviewed refill history and assessed adherence.   Patient Goals/Self-Care Activities Over the next 90 days, patient will:  take medications as prescribed,  check glucose using Continuous Glucose Monitor prior to every meal and as needed, document, and provide at future appointments,  check blood pressure 1 to 2 times per week (after purchases home blood pressure cuff), document, and provide at future appointments,  collaborate with provider on medication access solutions  Follow Up Plan: Telephone follow up appointment with care management team member scheduled for:  6 weeks.          Medication  Assistance:  Screened for LIS / Extra Help  and medication assistance program for Toujeo, Rybelsus and Humalog. Unfortunately patient's income is over limit for both LIS and medication assistance programs.   Patient's preferred pharmacy is:  [id number] [address], [address] AT [facility] OF [address] [place] [address] Phone: [phone] Fax: [place], [place]. [place]. [place] [address] Phone: [phone] Fax: [phone]  [facility] [place] [place] [address] Phone: [phone] Fax: [phone]  [facility] [place] [place] [facility] [address] [place] [address] Phone: [phone] Fax: [phone]  [id number] [place] [address]: [phone]: [phone]  Follow Up:  Patient agrees to Care Plan and Follow-up.  Plan: Telephone follow up appointment with care management team member scheduled for:  6 weeks Follow up with provider re: hypertension and hyperlipidemia June 21, 2021  TCherre Robins PharmD Clinical Pharmacist [facility] I have personally reviewed this encounter including the documentation in this note and have collaborated with the care management provider regarding care management and care coordination activities to include development and update of the comprehensive care plan. I am certifying that I agree with the content of this note and encounter as supervising physician.

## 2021-07-02 ENCOUNTER — Other Ambulatory Visit: Payer: Self-pay | Admitting: Internal Medicine

## 2021-07-02 ENCOUNTER — Encounter: Payer: Self-pay | Admitting: Internal Medicine

## 2021-07-02 ENCOUNTER — Ambulatory Visit (INDEPENDENT_AMBULATORY_CARE_PROVIDER_SITE_OTHER): Payer: Medicare HMO | Admitting: Internal Medicine

## 2021-07-02 VITALS — BP 132/84 | HR 86 | Ht 64.0 in | Wt 208.0 lb

## 2021-07-02 DIAGNOSIS — E1159 Type 2 diabetes mellitus with other circulatory complications: Secondary | ICD-10-CM | POA: Diagnosis not present

## 2021-07-02 DIAGNOSIS — E1142 Type 2 diabetes mellitus with diabetic polyneuropathy: Secondary | ICD-10-CM

## 2021-07-02 DIAGNOSIS — Z794 Long term (current) use of insulin: Secondary | ICD-10-CM | POA: Diagnosis not present

## 2021-07-02 DIAGNOSIS — N184 Chronic kidney disease, stage 4 (severe): Secondary | ICD-10-CM

## 2021-07-02 DIAGNOSIS — E1122 Type 2 diabetes mellitus with diabetic chronic kidney disease: Secondary | ICD-10-CM

## 2021-07-02 LAB — POCT GLYCOSYLATED HEMOGLOBIN (HGB A1C): Hemoglobin A1C: 7.4 % — AB (ref 4.0–5.6)

## 2021-07-02 MED ORDER — TOUJEO MAX SOLOSTAR 300 UNIT/ML ~~LOC~~ SOPN
24.0000 [IU] | PEN_INJECTOR | Freq: Every day | SUBCUTANEOUS | 3 refills | Status: DC
Start: 2021-07-02 — End: 2021-07-25

## 2021-07-02 MED ORDER — RYBELSUS 14 MG PO TABS: 14.0000 mg | ORAL_TABLET | Freq: Every day | ORAL | 3 refills | Status: DC

## 2021-07-02 MED ORDER — HUMALOG KWIKPEN 200 UNIT/ML ~~LOC~~ SOPN: PEN_INJECTOR | SUBCUTANEOUS | 6 refills | Status: DC

## 2021-07-02 NOTE — Patient Instructions (Signed)
-   Increase  Rybelsus 14 mg, 1 tablet before Breakfast  - Decrease Toujeo to 24 units once daily  - Increase  Humalog 14  units with each meal   -Humalog correctional insulin: ADD extra units on insulin to your meal-time Humalog dose if your blood sugars are higher than 160. Use the scale below to help guide you:   Blood sugar before meal Number of units to inject  Less than 160 0 unit  161 -  190 1 units  191 -  220 2 units  221 -  250 3 units  251 -  280 4 units  281 -  310 5 units  311 -  340 6 units  341 -  370 7 units  371 -  400 8 units  401 - 430 9 units        HOW TO TREAT LOW BLOOD SUGARS (Blood sugar LESS THAN 70 MG/DL) Please follow the RULE OF 15 for the treatment of hypoglycemia treatment (when your (blood sugars are less than 70 mg/dL)   STEP 1: Take 15 grams of carbohydrates when your blood sugar is low, which includes:  3-4 GLUCOSE TABS  OR 3-4 OZ OF JUICE OR REGULAR SODA OR ONE TUBE OF GLUCOSE GEL    STEP 2: RECHECK blood sugar in 15 MINUTES STEP 3: If your blood sugar is still low at the 15 minute recheck --> then, go back to STEP 1 and treat AGAIN with another 15 grams of carbohydrates.

## 2021-07-02 NOTE — Progress Notes (Addendum)
Name: Hayley Case  Age/ Sex: 71 y.o., female   MRN/ DOB: [id number], 1950-11-04     PCP: Marrian Salvage, FNP   Reason for Endocrinology Evaluation: Type 2 Diabetes Mellitus  Initial Endocrine Consultative Visit: 11/30/2018    PATIENT IDENTIFIER: Hayley Case is a 71 y.o. female with a past medical history of HTN, T2DM, PAD, coronary artery disease and Dyslipidemia . The patient has followed with Endocrinology clinic since 11/30/2018 for consultative assistance with management of her diabetes.  DIABETIC HISTORY:  Hayley Case was diagnosed with T2DM > 20 yrs ago. She was on metformin, Trulicity, and Byetta. Has been on insulin since ~ 2010.  Her hemoglobin A1c has ranged from 7.6 % in 2019, peaking at 10.7 % in 2020.     On her initial visit to our clinic, she had an A1c of 11.8%. She was on lantus/humalog but was not taking regularly, switched to insulin mix for ease of take.    Was on Lipitor but caused elevated LFT's requiring hospitalization.   Moved from [place] to help her son who is on peritoneal dialysis. Has lost another son to MI    By 06/2019 we attempted to put her on the V-Go but was cost prohibitive.   By 09/2019 we switched insulin mix to MDi regimen due to persistent hyperglycemia  SUBJECTIVE:   During the last visit (12/25/2020): A1c 7.9 %, adjusted MDI regimen and continued Rybelsus     Today (07/02/2021): Hayley Case is here for a follow up on diabetes.  She checks her blood sugars multiple times daily,through CGM.  preprandial. The patient has had hypoglycemic episodes since the last clinic visit.The patient is symptomatic with these episodes.      She was seen recently by her PCP for acute vaginitis Was evaluated by cardiology 03/19/2021 for peripheral vascular disease Son continues with dialysis, pending weight loss sx to qualify for transplant   She is on praluent    Denies nausea or vomiting   HOME DIABETES REGIMEN:  Toujeo 32  units once daily Humalog 12 units with each meal Rybelsus 7 mg 1 tablet with breakfast CF: Humalog (BG -130/30)      Statin: Off due to elevated LFT's and myalgias  ACE-I/ARB: Yes     CONTINUOUS GLUCOSE MONITORING RECORD INTERPRETATION    Dates of Recording: 6/7-06/2021  Sensor description:freestyle libre  Results statistics:   CGM use % of time 36  Average and SD 168/32.2  Time in range   54%  % Time Above 180 39  % Time above 250 6  % Time Below target 1    Glycemic patterns summary: Patient has been noted with hypoglycemia overnight, and hyperglycemia during the day  Hyperglycemic episodes  postprandial   Hypoglycemic episodes occurred fasting   Overnight periods: Trends down         DIABETIC COMPLICATIONS: Microvascular complications:  CKD III, DR , neuropathy  Last eye exam: Completed 06/2021   Macrovascular complications:   PVD Denies: CAD, CVA   HISTORY:  Past Medical History:  Past Medical History:  Diagnosis Date   CKD (chronic kidney disease)    Diabetes mellitus without complication ([place])    Hyperlipidemia    Hypertension    Past Surgical History:  Past Surgical History:  Procedure Laterality Date   BREAST BIOPSY     BREAST EXCISIONAL BIOPSY Left    Social History:  reports that she has never smoked. She has never used smokeless tobacco. She  reports that she does not drink alcohol and does not use drugs. Family History:  Family History  Problem Relation Age of Onset   Skin cancer Mother    Heart attack Father    Colon cancer Sister      HOME MEDICATIONS: Allergies as of 07/02/2021       Reactions   Lisinopril Cough   Atorvastatin Other (See Comments)   Liver function test elevation Other reaction(s): liver issues   Penicillins Hives, Itching   Did it involve swelling of the face/tongue/throat, SOB, or low BP? No Did it involve sudden or severe rash/hives, skin peeling, or any reaction on the inside of your mouth or nose?  Yes Did you need to seek medical attention at a hospital or doctor's office? Yes When did it last happen?      2012 If all above answers are "NO", may proceed with cephalosporin use.   Rosuvastatin Other (See Comments)   Myalgias / leg cramps   Sulfamethoxazole-trimethoprim Hives   Other reaction(s): Unknown   Penicillin G    Other reaction(s): Unknown        Medication List        Accurate as of July 02, 2021  9:51 AM. If you have any questions, ask your nurse or doctor.          ACETAMINOPHEN PO Take 650 mg by mouth every 6 (six) hours as needed for headache or moderate pain.   albuterol 108 (90 Base) MCG/ACT inhaler Commonly known as: VENTOLIN HFA Inhale 2 puffs into the lungs every 6 (six) hours as needed for wheezing or shortness of breath.   allopurinol 100 MG tablet Commonly known as: ZYLOPRIM Take 100 mg by mouth daily.   amLODipine 10 MG tablet Commonly known as: NORVASC TAKE 1 TABLET BY MOUTH DAILY   Biotin 5000 MCG Caps Take 1 capsule by mouth daily.   cholecalciferol 25 MCG (1000 UNIT) tablet Commonly known as: VITAMIN D3 Take 2,000 Units by mouth daily.   COLACE PO Take 1 capsule by mouth daily.   ezetimibe 10 MG tablet Commonly known as: ZETIA Take 1 tablet (10 mg total) by mouth daily.   fluocinonide 0.05 % external solution Commonly known as: LIDEX Apply topically in the morning and at bedtime.   FreeStyle Libre 2 Sensor Misc Inject 1 Device into the skin as directed. Use as directed every 14 days. E11.21   HumaLOG KwikPen 200 UNIT/ML KwikPen Generic drug: insulin lispro Inject 12 Units into the skin with breakfast, with lunch, and with evening meal. Max daily 80 units with correction scale   loratadine 10 MG tablet Commonly known as: CLARITIN Take 10 mg by mouth daily as needed (seasonal allergies).   losartan 50 MG tablet Commonly known as: COZAAR Take 1 tablet (50 mg total) by mouth daily.   Pen Needles 32G X 4 MM Misc 1  Device by Does not apply route in the morning, at noon, in the evening, and at bedtime.   Praluent 150 MG/ML Soaj Generic drug: Alirocumab Inject 150 mg into the skin every 14 (fourteen) days.   REFRESH TEARS OP Place 1 drop into both eyes at bedtime.   Rybelsus 14 MG Tabs Generic drug: Semaglutide Take 1 tablet (14 mg total) by mouth daily. What changed:  medication strength See the new instructions. Changed by: Dorita Sciara, MD   Toujeo Max SoloStar 300 UNIT/ML Solostar Pen Generic drug: insulin glargine (2 Unit Dial) Inject 36 Units into the skin daily.  vitamin B-12 1000 MCG tablet Commonly known as: CYANOCOBALAMIN Take 1,000 mcg by mouth daily.         OBJECTIVE:   Vital Signs: BP 132/84 (BP Location: Left Arm, Patient Position: Sitting, Cuff Size: Large)   Pulse 86   Ht 5\' 4"  (1.626 m)   Wt 208 lb (94.3 kg)   SpO2 97%   BMI 35.70 kg/m   Wt Readings from Last 3 Encounters:  07/02/21 208 lb (94.3 kg)  06/21/21 204 lb (92.5 kg)  06/11/21 206 lb (93.4 kg)     Exam: General: Hayley Case appears well and is in NAD  Lungs: Clear with good BS bilat with no rales, rhonchi, or wheezes  Heart: RRR with normal S1 and S2 and no gallops; no murmurs; no rub  Abdomen:  Soft with supra pubic tenderness  Extremities: Trace pretibial edema.  Neuro: MS is good with appropriate affect, Hayley Case is alert and Ox3       DM foot exam: 12/25/2020   The skin of the feet is intact without sores or ulcerations. The pedal pulses are 2+ on right and 2+ on left. The sensation is intact to a screening 5.07, 10 gram monofilament bilaterally     DATA REVIEWED:  Lab Results  Component Value Date   HGBA1C 7.4 (A) 07/02/2021   HGBA1C 7.9 (A) 12/25/2020   HGBA1C 8.4 (A) 06/12/2020     Latest Reference Range & Units 09/18/20 09:53  Sodium 135 - 145 mEq/L 141  Potassium 3.5 - 5.1 mEq/L 4.2  Chloride 96 - 112 mEq/L 106  CO2 19 - 32 mEq/L 26  Glucose 70 - 99 mg/dL 97  BUN 6 - 23  mg/dL 31 (H)  Creatinine 0.40 - 1.20 mg/dL 1.98 (H)  Calcium 8.4 - 10.5 mg/dL 9.3  Alkaline Phosphatase 39 - 117 U/L 105  Albumin 3.5 - 5.2 g/dL 3.6  AST 0 - 37 U/L 13  ALT 0 - 35 U/L 13  Total Protein 6.0 - 8.3 g/dL 6.8  Total Bilirubin 0.2 - 1.2 mg/dL 0.6  GFR >60.00 mL/min 25.33 (L)    ASSESSMENT / PLAN / RECOMMENDATIONS:   1) Type 2 Diabetes Mellitus, Optimally controlled, With CKD IV, retinopathy and neuropathic  and macrovascular complications - Most recent A1c of 7.4 %. Goal A1c < 7.5 %.      -I have praised the patient on continued improvement in her glycemic control -She is asking for increase Rybelsus, she has no side effects - We have attempted to put her on the V-go but that was cost prohibitive -Due to fasting hypoglycemia, and increasing Rybelsus will reduce her basal insulin as below  -She has been noted with postprandial hyperglycemia, will increase prandial insulin as below -She was encouraged to continue with using correction scale if needed    MEDICATIONS: -Decreased Toujeo to 24 units once daily -Increase Humalog 14 units with each meal -Increase Rybelsus 14 mg 1 tablet with breakfast - CF: Humalog (BG -130/30)     EDUCATION / INSTRUCTIONS: BG monitoring instructions: Patient is instructed to check her blood sugars 3 times a day, before meals I reviewed the Rule of 15 for the treatment of hypoglycemia in detail with the patient. Literature supplied.   2) PVD/Dyslipidemia : Managed by cardiology   F/U in 6 months    Signed electronically by: Mack Guise, MD  [facility] Endocrinology  [facility] [address] [place], [place] [address] Phone: [phone] FAX: [phone]   CC:  Marrian Salvage, [address] [address] Phone: [phone]  Fax: [phone]  Return to Endocrinology clinic as below: Future Appointments  Date Time Provider [place]   07/18/2021 10:00 AM Bauert, Nicolasa Ducking, LCSW [facility] None  08/09/2021 10:30 AM [facility] CCM PHARMACIST [facility]  08/16/2021 10:00 AM Bauert, Terri W, LCSW [facility] None  08/28/2021 10:15 AM Truett Mainland, DO [facility] None  04/24/2022 10:30 AM [facility] HEALTH COACH [facility]  05/26/2022 10:00 AM [facility] VASC 2 [facility]

## 2021-07-03 ENCOUNTER — Encounter: Payer: Self-pay | Admitting: Internal Medicine

## 2021-07-04 ENCOUNTER — Other Ambulatory Visit (HOSPITAL_COMMUNITY): Payer: Self-pay

## 2021-07-12 DIAGNOSIS — Z794 Long term (current) use of insulin: Secondary | ICD-10-CM | POA: Diagnosis not present

## 2021-07-12 DIAGNOSIS — I10 Essential (primary) hypertension: Secondary | ICD-10-CM | POA: Diagnosis not present

## 2021-07-12 DIAGNOSIS — E1122 Type 2 diabetes mellitus with diabetic chronic kidney disease: Secondary | ICD-10-CM

## 2021-07-12 DIAGNOSIS — E785 Hyperlipidemia, unspecified: Secondary | ICD-10-CM | POA: Diagnosis not present

## 2021-07-18 ENCOUNTER — Ambulatory Visit (INDEPENDENT_AMBULATORY_CARE_PROVIDER_SITE_OTHER): Payer: Medicare HMO | Admitting: Psychology

## 2021-07-18 DIAGNOSIS — F4323 Adjustment disorder with mixed anxiety and depressed mood: Secondary | ICD-10-CM

## 2021-07-18 NOTE — Progress Notes (Signed)
[facility] Counselor/Therapist Progress Note  Patient ID: Hayley Case, MRN: [id number],    Date: 07/18/2021  Time Spent: 10:00am-10:55am   55 minutes   Treatment Type: Individual Therapy  Reported Symptoms: stress and overwhelm  Mental Status Exam: Appearance:  Casual     Behavior: Appropriate  Motor: Normal  Speech/Language:  Normal Rate  Affect: Appropriate  Mood: normal  Thought process: normal  Thought content:   WNL  Sensory/Perceptual disturbances:   WNL  Orientation: oriented to person, place, time/date, and situation  Attention: Good  Concentration: Good  Memory: WNL  Fund of knowledge:  Good  Insight:   Good  Judgment:  Good  Impulse Control: Good   Risk Assessment: Danger to Self:  No Self-injurious Behavior: No Danger to Others: No Duty to Warn:no Physical Aggression / Violence:No  Access to Firearms a concern: No  Gang Involvement:No   Subjective: Pt present for face-to-face individual therapy via video Webex.  Pt consents to telehealth video session due to COVID 19 pandemic. Location of pt: home. Location of therapist: home office.  Pt talked about the experience of her and her husband looking for a house to buy.  They almost bought a house but it all fell through after the inspection.   The home owner had not been honest about issues with the house.  Pt lost money on her due diligence and is upset about how her realtor handled things.  Addressed pt's frustrations and concerns.   Pt talked about caregiving for her son.   He had to go to the ER twice.  Pt's son was in a lot of pain bc of his back.  Pt tends to worry about him.   Worked on managing worry thoughts.   Addressed the stress of caregiving.  Pt feels like she goes through the motions of each day bc of all the stress of caregiving.  Worked on Child psychotherapist.  Worked on how pt can increase self care.  Provided supportive therapy.     Interventions: Cognitive Behavioral Therapy  and Insight-Oriented  Diagnosis: F43.23  Plan: See pt's Treatment Plan for depression and anxiety in Therapy Charts.  (Treatment Plan Target Date: 11/05/2021) Pt is progressing toward treatment goals.   Plan to continue to see pt monthly.    Marji Kuehnel, LCSW

## 2021-07-23 ENCOUNTER — Other Ambulatory Visit: Payer: Self-pay | Admitting: Family

## 2021-07-23 DIAGNOSIS — Z1231 Encounter for screening mammogram for malignant neoplasm of breast: Secondary | ICD-10-CM

## 2021-07-25 ENCOUNTER — Other Ambulatory Visit: Payer: Self-pay | Admitting: Internal Medicine

## 2021-08-05 ENCOUNTER — Telehealth: Payer: Self-pay

## 2021-08-05 NOTE — Telephone Encounter (Signed)
Nurse Assessment Nurse: Randall An, RN, [place] Date/Time (Eastern Time): 08/03/2021 9:42:48 AM Confirm and document reason for call. If symptomatic, describe symptoms. ---Caller states she has a gout. Her big right toe is giving her walking difficulty. She is looking to get medication. Patient takes medication for gout now but it's not helping. Symptoms started a couple days ago. Does the patient have any new or worsening symptoms? ---Yes Will a triage be completed? ---Yes Related visit to physician within the last 2 weeks? ---No Does the PT have any chronic conditions? (i.e. diabetes, asthma, this includes High risk factors for pregnancy, etc.) ---Yes List chronic conditions. ---Gout DM HTN HLD Is this a behavioral health or substance abuse call? ---No Guidelines Guideline Title Affirmed Question Affirmed Notes Nurse Date/Time (Eastern Time) Toe Pain [1] Swollen toe AND [2] no fever (Exceptions: Just a localized bump from bunion, corns, insect bite, sting.) Dunston, RN, [place] 08/03/2021 9:44:08 AM PLEASE NOTE: All timestamps contained within this report are represented as [place] Standard Time. CONFIDENTIALTY NOTICE: This fax transmission is intended only for the addressee. It contains information that is legally privileged, confidential or otherwise protected from use or disclosure. If you are not the intended recipient, you are strictly prohibited from reviewing, disclosing, copying using or disseminating any of this information or taking any action in reliance on or regarding this information. If you have received this fax in error, please notify us immediately by telephone so that we can arrange for its return to [place]. Phone: [phone], Toll-Free: [phone], Fax: [phone] Page: 2 of 2 Call Id: [id number] [place]. Time Eilene Ghazi Time) Disposition Final User 08/03/2021 9:47:00 AM See PCP within 24 Hours Yes Randall An, RN, [place] Final Disposition 08/03/2021 9:47:00 AM See  PCP within 24 Hours Yes Dunston, RN, [place] Caller Disagree/Comply Comply Caller Understands Yes PreDisposition Did not know what to do Care Advice Given Per Guideline SEE PCP WITHIN 24 HOURS: * IF OFFICE WILL BE CLOSED: You need to be seen within the next 24 hours. A clinic or an urgent care center is often a good source of care if your doctor's office is closed or you can't get an appointment. * For pain relief, you can take either acetaminophen, ibuprofen, or naproxen. PAIN MEDICINES: * Use nurse judgment to select the most appropriate source of care. * You become worse * Fever occurs CALL BACK IF: CARE ADVICE given per Toe Pain (Adult) guideline. Comments User: Dewayne Shorter, RN Date/Time Eilene Ghazi Time): 08/03/2021 9:44:45 AM Shiny with mild swelling Referrals GO TO FACILITY UNDECIDED

## 2021-08-09 ENCOUNTER — Ambulatory Visit (INDEPENDENT_AMBULATORY_CARE_PROVIDER_SITE_OTHER): Payer: Medicare HMO | Admitting: Pharmacist

## 2021-08-09 DIAGNOSIS — I10 Essential (primary) hypertension: Secondary | ICD-10-CM

## 2021-08-09 DIAGNOSIS — E785 Hyperlipidemia, unspecified: Secondary | ICD-10-CM

## 2021-08-09 DIAGNOSIS — Z794 Long term (current) use of insulin: Secondary | ICD-10-CM

## 2021-08-09 DIAGNOSIS — N1832 Chronic kidney disease, stage 3b: Secondary | ICD-10-CM

## 2021-08-09 NOTE — Chronic Care Management (AMB) (Unsigned)
Chronic Care Management Pharmacy Note  08/09/2021 Name:  Hayley Case MRN:  [id number] DOB:  Aug 29, 1950  Summary: Diabetes: Saw endocrinologist 07/02/2021. A1c has improved from 7.9% to 7.4%. Dr Kelton Pillar did review home blood glucose reading and agreed with lowering dose of Toujeo, changed from 36 units to 24 units. Also increased Rybelsus from 22m to 118mdaily. Patient states she is tolerating changes without any problems. She is having fewer low blood pressure readings / warnings form Continuous Glucose Monitor. Noted that Humalog was changed to Novolog due to insurance preference.   Hyperlipidemia: LDL goal per cardiology offie if < 55. She was unable to tolerate atorvastatin or rosuvastatin. Started Praluent 15030mvery 14 days 06/17/2021. Next dose if 07/01/2021. Continues to take ezetimibe 54m48mily. Hypertension: last blood pressure was elevated above goal of < 130/80 (CKD) . But blood pressure usually at goal. Patient states she is not checking blood pressure at home. Does not have blood pressure cuff. Patient has declined to check blood pressure at home. Last office blood pressure was 132/84. Recommended continue current therapy.  Gout: Recent acute gout episode that was treated with prednisone for 5 days. Patient states pain has improved and she has completed course of prednisone. She continues to take allopurinol 100mg1mly for prevention. She has not have gout episode in several years. Discussed foods that can increase uric acid / gout episodes. Send list by MyChart. If frequency of gout episodes increases, can increase allopurinol dose but will need to monitor closely for adverse effects / rash due to CKD.    Subjective: [name] 71 y.71 year old female who is a primary patient of MurraMarrian Case. [place] CCM team was consulted for assistance with disease management and care coordination needs.    Engaged with patient by telephone for follow up visit in  response to provider referral for pharmacy case management and/or care coordination services.   Consent to Services:  The patient was given information about Chronic Care Management services, agreed to services, and gave verbal consent prior to initiation of services.  Please see initial visit note for detailed documentation.   Patient Care Team: MurraMarrian Case aMountain HomeCP - General (Internal Medicine) O'Neal, WesleCassie Freeras PCP - Cardiology (Cardiology) EckarCherre Robins-CSuperiorrmacist)  Recent office visits: 06/21/2021 - Fam MHeriberto Antigua(MurrValere Dross Seen for acute vaginitis. Checked vaginal swab and urine culture. Both were clear. Recommended stopping coconut oil to see if vaginitis improved.   Recent consult visits: 07/18/2021 - Seen by counselor TerriClint BolderW.  07/02/2021 - Endo (Dr ShamlKelton Pillar diabetes. Lowered dose of Lantus form 36 to 24 units daily; Increased Rybelsus to 14mg 3my. ALso increased Humalog to 14 units with each meal + correction for BG > 130 - 1 unit per every 30 points. 06/11/2021 - Cardio ([place], Rph,CPP) seen for initiation of PCSK9. Started Praluent 150mg e81m 14 days. Repeat lipids / LFTs in 3 months. LDL goal < 55.  06/03/2021 - Cardio (Dr O'Neal)Audie BoxAD and hyperlipidemia. Patient retried rosuvastatin but could not tolerate. Referred to cardio pharmacist to initiate PCSK9 agent. F/U in 1 year.  05/30/2021 - GYN (Dr StinsonNehemiah Settlefor vaginal irritation. Prescribed diflucan 150mg ev11m3 days for 3 doses. Can use coconut oil to outside of vagina as needed. F/U 2 to 3 months.  03/19/2021 - Cardio (Dr Arida) SFletcher Anonor PAD. No med changes noted. Has been intolerate to statin in past. On  ezetimibe, if not able to get LDL to < 70 , then consider PCSK9.   Hospital visits: None in previous 6 months  Objective:  Lab Results  Component Value Date   CREATININE 1.98 (H) 09/18/2020   CREATININE 2.17 (H) 05/06/2019   CREATININE 1.86 (H) 07/28/2018     Lab Results  Component Value Date   HGBA1C 7.4 (A) 07/02/2021   Last diabetic Eye exam:  Lab Results  Component Value Date/Time   HMDIABEYEEXA No Retinopathy 12/16/2018 02:40 PM    Last diabetic Foot exam: No results found for: "HMDIABFOOTEX"      Component Value Date/Time   CHOL 114 05/06/2019 1222   TRIG 145 05/06/2019 1222   HDL 35 (L) 05/06/2019 1222   CHOLHDL 3.3 05/06/2019 1222   LDLCALC 54 05/06/2019 1222       Latest Ref Rng & Units 09/18/2020    9:53 AM 05/06/2019   12:22 PM 07/28/2018    4:43 PM  Hepatic Function  Total Protein 6.0 - 8.3 g/dL 6.8  6.5  6.8   Albumin 3.5 - 5.2 g/dL 3.6  3.6  3.7   AST 0 - 37 U/L 13  17  172   ALT 0 - 35 U/L 13  26  295   Alk Phosphatase 39 - 117 U/L 105  212  527   Total Bilirubin 0.2 - 1.2 mg/dL 0.6  0.5  0.8     Lab Results  Component Value Date/Time   TSH 2.63 09/18/2020 09:53 AM       Latest Ref Rng & Units 09/18/2020    9:53 AM 07/28/2018    4:43 PM 07/14/2018    5:53 AM  CBC  WBC 4.0 - 10.5 K/uL 9.4  6.1  4.8   Hemoglobin 12.0 - 15.0 g/dL 11.7  12.2  11.3   Hematocrit 36.0 - 46.0 % 34.2  36.7  34.2   Platelets 150.0 - 400.0 K/uL 329.0  232  189     Lab Results  Component Value Date/Time   VD25OH 21.3 (L) 05/18/2018 02:46 PM   VD25OH 16.7 (L) 11/13/2017 11:36 AM    Clinical ASCVD: Yes  The ASCVD Risk score (Arnett DK, et al., 2019) failed to calculate for the following reasons:   The valid total cholesterol range is 130 to 320 mg/dL     Social History   Tobacco Use  Smoking Status Never  Smokeless Tobacco Never   BP Readings from Last 3 Encounters:  07/02/21 132/84  06/21/21 136/78  06/11/21 (!) 150/86   Pulse Readings from Last 3 Encounters:  07/02/21 86  06/21/21 90  06/11/21 95   Wt Readings from Last 3 Encounters:  07/02/21 208 lb (94.3 kg)  06/21/21 204 lb (92.5 kg)  06/11/21 206 lb (93.4 kg)    Assessment: Review of patient past medical history, allergies, medications, health status,  including review of consultants reports, laboratory and other test data, was performed as part of comprehensive evaluation and provision of chronic care management services.   SDOH:  (Social Determinants of Health) assessments and interventions performed:    CCM Care Plan  Allergies  Allergen Reactions   Lisinopril Cough   Atorvastatin Other (See Comments)    Liver function test elevation Other reaction(s): liver issues   Penicillins Hives and Itching    Did it involve swelling of the face/tongue/throat, SOB, or low BP? No Did it involve sudden or severe rash/hives, skin peeling, or any reaction on the inside of your mouth  or nose? Yes Did you need to seek medical attention at a hospital or doctor's office? Yes When did it last happen?      2012 If all above answers are "NO", may proceed with cephalosporin use.   Rosuvastatin Other (See Comments)    Myalgias / leg cramps   Sulfamethoxazole-Trimethoprim Hives    Other reaction(s): Unknown   Penicillin G     Other reaction(s): Unknown    Medications Reviewed Today     Reviewed by Cherre Robins, RPH-CPP (Pharmacist) on 08/09/21 at 71  Med List Status: <None>   Medication Order Taking? Sig Documenting Provider Last Dose Status Informant  ACETAMINOPHEN PO [id number] Yes Take 650 mg by mouth every 6 (six) hours as needed for headache or moderate pain. [provider] Taking Active Self  albuterol (VENTOLIN HFA) 108 (90 Base) MCG/ACT inhaler [id number]  Inhale 2 puffs into the lungs every 6 (six) hours as needed for wheezing or shortness of breath. Geralynn Rile, MD  Active   Alirocumab (PRALUENT) 150 MG/ML Darden Palmer [id number] Yes Inject 150 mg into the skin every 14 (fourteen) days. Geralynn Rile, MD Taking Active   allopurinol (ZYLOPRIM) 100 MG tablet [id number] Yes Take 100 mg by mouth daily. [provider] Taking Active   amLODipine (NORVASC) 10 MG tablet [id number] Yes TAKE 1 TABLET BY MOUTH DAILY O'Neal,  Cassie Freer, MD Taking Active   Biotin 5000 MCG CAPS [id number] Yes Take 1 capsule by mouth daily. [provider] Taking Active Self  Carboxymethylcellulose Sodium (REFRESH TEARS OP) [id number] Yes Place 1 drop into both eyes at bedtime.  [provider] Taking Active Multiple Informants  cholecalciferol (VITAMIN D3) 25 MCG (1000 UNIT) tablet [id number] Yes Take 2,000 Units by mouth daily. [provider] Taking Active Self  Continuous Blood Gluc Sensor (FREESTYLE LIBRE 2 SENSOR) MISC [id number] Yes Inject 1 Device into the skin as directed. Use as directed every 14 days. E11.21 Shamleffer, Melanie Crazier, MD Taking Active   Docusate Sodium (COLACE PO) [id number] Yes Take 1 capsule by mouth daily. [provider] Taking Active   ezetimibe (ZETIA) 10 MG tablet [id number] Yes Take 1 tablet (10 mg total) by mouth daily. Geralynn Rile, MD Taking Active   fluocinonide (LIDEX) 0.05 % external solution [id number] Yes Apply topically in the morning and at bedtime. [provider] Taking Active            Med Note Antony Contras, Darrian Grzelak B   Thu Jun 27, 2021 10:43 AM) Using as needed  insulin aspart (NOVOLOG FLEXPEN) 100 UNIT/ML FlexPen [id number] Yes Max daily 70 units Shamleffer, Melanie Crazier, MD Taking Active   insulin glargine, 2 Unit Dial, (TOUJEO MAX SOLOSTAR) 300 UNIT/ML Solostar Pen [id number] Yes Inject 24 Units into the skin daily at 6 (six) AM. Shamleffer, Melanie Crazier, MD Taking Active   Insulin Pen Needle (PEN NEEDLES) 32G X 4 MM MISC [id number] Yes 1 Device by Does not apply route in the morning, at noon, in the evening, and at bedtime. Marrian Salvage, FNP Taking Active   loratadine (CLARITIN) 10 MG tablet [id number] Yes Take 10 mg by mouth daily as needed (seasonal allergies). [provider] Taking Active Self  losartan (COZAAR) 50 MG tablet [id number] Yes Take 1 tablet (50 mg total) by mouth daily. Marrian Salvage, FNP Taking  Active   predniSONE (DELTASONE) 20 MG tablet [id number] Yes Take 40 mg by mouth daily. [provider] Taking Active   Semaglutide (RYBELSUS) 14 MG TABS [id number]  Yes Take 1 tablet (14 mg total) by mouth daily. Shamleffer, Melanie Crazier, MD Taking Active   vitamin B-12 (CYANOCOBALAMIN) 1000 MCG tablet [id number] Yes Take 1,000 mcg by mouth daily. [provider] Taking Active             Patient Active Problem List   Diagnosis Date Noted   Type 2 diabetes mellitus with stage 4 chronic kidney disease, with long-term current use of insulin ([place]) 07/02/2021   Arthritis of hip 04/18/2021   Background diabetic retinopathy ([place]) 04/18/2021   Chronic kidney disease, stage 3b ([place]) 04/18/2021   Dyslipidemia 04/18/2021   Family history of malignant neoplasm of digestive organs 04/18/2021   Gouty arthritis 04/18/2021   Hearing loss 04/18/2021   Hyperglycemia due to type 2 diabetes mellitus ([place]) 04/18/2021   Long term (current) use of insulin ([place]) 04/18/2021   Obstructive sleep apnea (adult) (pediatric) 04/18/2021   Peripheral arterial disease ([place]) 04/18/2021   Personal history of colonic polyps 04/18/2021   Flank pain 10/14/2019   Type 2 diabetes mellitus with diabetic polyneuropathy, with long-term current use of insulin ([place]) 12/01/2018   Type 2 diabetes mellitus with retinopathy, with long-term current use of insulin ([place]) 12/01/2018   Type 2 diabetes mellitus with stage 3b chronic kidney disease, with long-term current use of insulin ([place]) 11/30/2018   Non-intractable vomiting    Acute hepatitis 07/13/2018   Acute on chronic renal insufficiency 07/12/2018   Diabetes mellitus ([place]) 12/08/2016   Essential hypertension 12/08/2016   Hyperlipidemia associated with type 2 diabetes mellitus ([place]) 12/08/2016    Immunization History  Administered Date(s) Administered   Fluad Quad(high Dose 65+) 10/19/2020   Influenza Split 12/17/2018, 12/23/2018   Influenza, High Dose  Seasonal PF 12/16/2019   Influenza,inj,Quad PF,6+ Mos 11/13/2017   Influenza-Unspecified 12/17/2018   PFIZER Comirnaty(Gray Top)Covid-19 Tri-Sucrose Vaccine 11/18/2019   PFIZER(Purple Top)SARS-COV-2 Vaccination 03/11/2019, 04/05/2019, 11/18/2019   Pneumococcal Polysaccharide-23 12/23/2018   Tdap 07/27/2019   Zoster, Live 07/27/2019, 12/01/2019    Conditions to be addressed/monitored: HTN, HLD, ESRD, and CKD Stage 4  Care Plan : General Pharmacy (Adult)  Updates made by Cherre Robins, RPH-CPP since 08/12/2021 12:00 AM     Problem: Chronic Conditions: hypertension, hyperlipidemia, diabetes, decrease in kidney function   Priority: High  Onset Date: 05/15/2021     Long-Range Goal: Provide education, support and care coordination for medication therapy and chronic conditions   Start Date: 05/15/2021  Priority: High  Note:   Current Barriers:  Unable to independently afford treatment regimen Unable to independently monitor therapeutic efficacy Unable to maintain control of hypertension and hyperlipidemia  Pharmacist Clinical Goal(s):  Over the next 90 days, patient will verbalize ability to afford treatment regimen achieve control of hypertension and HDL as evidenced by blood pressure < 130/80 and LDL < 70 adhere to plan to optimize therapeutic regimen for type 2 diabetes and HDL as evidenced by report of adherence to recommended medication management changes through collaboration with PharmD and provider.   Interventions: 1:1 collaboration with Marrian Case, [place] regarding development and update of comprehensive plan of care as evidenced by provider attestation and co-signature Inter-disciplinary care team collaboration (see longitudinal plan of care) Comprehensive medication review performed; medication list updated in electronic medical record   Diabetes:   At goal; A1c goal < 7.5%.  Sees Dr Kelton Pillar at [facility] Current treatment: Rybelsus 75m daily   Toujeo Max Pen 300u/ mL - inject 24 units daily Novolog - inject 14 units +  additional correction amounts prior to each meal based on blood glucose.  Freestyle Libre 2 Sensor - Continuous Glucose Monitor  Denies hyperglycemic symptoms Reports low blood glucose improved since dose of Toujeo was lowered Interventions:  Reviewed home blood glucose readings and reviewed goals  Fasting blood glucose goal (before meals) = 80 to 130 Blood glucose goal after a meal = less than 180  Discussed hypoglycemia and reviewed how to treat and prevent. Patient to see endocrinologist in 5 days to discuss medication changes if needed.  Reviewed for medication assistance program for Ryblesus, Toujeo and Novolog (See below - medication manaagement)  Hyperlipidemia:  LDL goal of < 55  She has not taken statin in several months due to rosuvastatin 74m causing myalgias and atorvastatin causing increased LFTs in the past.  Patient retried rosuvastatin but was not able to tolerated. Praluent started with first dose 06/17/2021 Current treatment:  ezetimibe 162mdaily. Praulent 15051mvery 14 days.   Interventions:  Continue ezetimibe and Praluent 150m13mery 14 days Recheck lipids and LFTs in 1 to 2 months.   Hypertension:  last blood pressure was elevated above goal of < 130/80 (CKD) . But blood pressure usually at goal.  Current treatment:  Amlodipine 10mg57mly  Losartan 50mg 62my Patient states she is not checking blood pressure at home. Does not have blood pressure cuff.  Interventions:  Discussed purchasing blood pressure cuff with over-the-counter benefits thru [employer].  Start checking blood pressure 1 to 2 times per week, record and provide at future appointments.  Reviewed goal blood pressure of < 130/80 Limit intake of sodium to less than 2300mg o45mdium per day.    Medication Management:  Patient reported that cost for Medications was high. Discussed today and highest cost medications are  Toujeo, Rybelsus and Novolog.   Goal: work with PharmD and providers to maintain optimal medication adherence and when able identify opportunities to lower medication cost. Current pharmacy: [facility] Interventions:  All 3 medications - Toujeo, Rybelsus and Novolog - are $75 copay per 90 days per patient. Screened for LIS / Medicare Extra Help and also for medication assistance programs with manufacturers but patient's yearly income likely will not meet eligibility criteria. Mailed application for Novo [employer] at last but she has not completed - states she does not think she will qualify. (Addressed at previous visit) Reviewed refill history and assessed adherence.   Patient Goals/Self-Care Activities Over the next 90 days, patient will:  take medications as prescribed,  check glucose using Continuous Glucose Monitor prior to every meal and as needed, document, and provide at future appointments,  check blood pressure 1 to 2 times per week (after purchases home blood pressure cuff), document, and provide at future appointments,  collaborate with provider on medication access solutions Follow low purine diet to prevent gout episodes.   Follow Up Plan: Telephone follow up appointment with care management team member scheduled for:  6 to 8 weeks.           Medication Assistance:  Screened for LIS / Extra Help and medication assistance program for Toujeo, Rybelsus and Humalog. Unfortunately patient's income is over limit for both LIS and medication assistance programs.   [facility]  [id number] [id number] [facility] [address] [phone]  [address]ardene Speak [place] [address] Phone: [phone] Fax: [phone]  [facility] [place] [place] [address] Phone: [phone] Fax: [phone]  [facility] [place] [place] [facility] [address] [place] [address] Phone: [phone] Fax: [phone]  [id number] [place] [address]: [phone]: [phone]  Follow Up:  Patient agrees to Care Plan and Follow-up.  Plan: Telephone follow up appointment with care management team member scheduled for:  6 weeks Follow up with provider re: hypertension and hyperlipidemia June 21, 2021  TCherre Robins PharmD Clinical Pharmacist L[facility]Primary Care SW [facility] I have personally reviewed this encounter including the documentation in this note and have collaborated with the care management provider regarding care management and care coordination activities to include development and update of the comprehensive care plan. I am certifying that I agree with the content of this note and encounter as supervising physician.

## 2021-08-12 DIAGNOSIS — Z794 Long term (current) use of insulin: Secondary | ICD-10-CM

## 2021-08-12 DIAGNOSIS — E1122 Type 2 diabetes mellitus with diabetic chronic kidney disease: Secondary | ICD-10-CM | POA: Diagnosis not present

## 2021-08-12 DIAGNOSIS — I10 Essential (primary) hypertension: Secondary | ICD-10-CM | POA: Diagnosis not present

## 2021-08-12 DIAGNOSIS — N1832 Chronic kidney disease, stage 3b: Secondary | ICD-10-CM

## 2021-08-12 DIAGNOSIS — E785 Hyperlipidemia, unspecified: Secondary | ICD-10-CM

## 2021-08-12 NOTE — Patient Instructions (Signed)
Hayley Case I was so glad to speak with you today.  Below is a summary of your health goals and our recent visit. You can also view your update Chronic Care Management Care plan through your MyChart account.   Patient Goals/Self-Care Activities Over the next 90 days, patient will:  take medications as prescribed,  check glucose using Continuous Glucose Monitor prior to every meal and as needed, document, and provide at future appointments,  check blood pressure 1 to 2 times per week (after purchases home blood pressure cuff), document, and provide at future appointments,  collaborate with provider on medication access solutions Follow low purine diet to prevent gout episodes.   As always if you have any questions or concerns especially regarding medications, please feel free to contact me either at the phone number below or with a MyChart message.   Keep up the good work!  Cherre Robins, PharmD Clinical Pharmacist [place] [phone] (direct line)  [phone] (main office number)  Gout diet: What's allowed, what's not Starting a gout diet? Understand which foods are OK and which to avoid. By [facility] Staff Gout is a painful form of arthritis that occurs when high levels of uric acid in the blood cause crystals to form and accumulate in and around a joint. Uric acid is produced when the body breaks down a chemical called purine. Purine occurs naturally in your body, but it's also found in certain foods. Uric acid is eliminated from the body in urine. A gout diet may help decrease uric acid levels in the blood. A gout diet isn't a cure. But it may lower the risk of recurring gout attacks and slow the progression of joint damage. People with gout who follow a gout diet generally still need medication to manage pain and to lower levels of uric acid. Gout diet goals A gout diet is designed to help you: Achieve a healthy weight and good eating  habits Avoid some, but not all, foods with purines Include some foods that can control uric acid levels A good rule of thumb is to eat moderate portions of healthy foods. Diet details The general principles of a gout diet follow typical healthy-diet recommendations: Weight loss. Being overweight increases the risk of developing gout, and losing weight lowers the risk of gout. Research suggests that reducing the number of calories and losing weight -- even without a purine-restricted diet -- lower uric acid levels and reduce the number of gout attacks. Losing weight also lessens the overall stress on joints. Complex carbs. Eat more fruits, vegetables and whole grains, which provide complex carbohydrates. Avoid foods and beverages with high-fructose corn syrup, and limit consumption of naturally sweet fruit juices. Water. Stay well-hydrated by drinking water. Fats. Cut back on saturated fats from red meat, fatty poultry and high-fat dairy products. Proteins. Focus on lean meat and poultry, low-fat dairy and lentils as sources of protein.   Foods that might increase uric acid: Organ and glandular meats. Avoid meats such as liver, kidney and sweetbreads, which have high purine levels and contribute to high blood levels of uric acid. Red meat. Limit serving sizes of beef, lamb and pork. Seafood. Some types of seafood -- such as anchovies, shellfish (shrimp, oysters crab), sardines and tuna -- are higher in purines than are other types. But the overall health benefits of eating fish may outweigh the risks for people with gout. Moderate portions of fish can be part of a gout diet. High-purine vegetables.  Studies have shown that vegetables high in purines, such as asparagus and spinach, don't increase the risk of gout or recurring gout attacks. Alcohol. Beer and distilled liquors are associated with an increased risk of gout and recurring attacks. Moderate consumption of wine doesn't appear to increase the  risk of gout attacks. Avoid alcohol during gout attacks, and limit alcohol, especially beer, between attacks. Sugary foods and beverages. Limit or avoid sugar-sweetened foods such as sweetened cereals, bakery goods and candies. Limit consumption of naturally sweet fruit juices. Foods that might lower gout risk: Vitamin C. Vitamin C may help lower uric acid levels. Talk to your doctor about whether a 500-milligram vitamin C supplement fits into your diet and medication plan. Coffee. Some research suggests that drinking coffee in moderation, especially regular caffeinated coffee, may be associated with a reduced risk of gout. Drinking coffee may not be appropriate if you have other medical conditions. Talk to your doctor about how much coffee is right for you. Cherries. There is some evidence that eating cherries is associated with a reduced risk of gout attacks.  Results Following a gout diet can help limit uric acid production and increase its elimination. A gout diet isn't likely to lower the uric acid concentration in your blood enough to treat your gout without medication. But it may help decrease the number of attacks and limit their severity. Following a gout diet, along with limiting calories and getting regular exercise, can also improve your overall health by helping you achieve and maintain a healthy weight.  Patient verbalizes understanding of instructions and care plan provided today and agrees to view in [place]. Active MyChart status and patient understanding of how to access instructions and care plan via MyChart confirmed with patient.

## 2021-08-13 ENCOUNTER — Ambulatory Visit
Admission: RE | Admit: 2021-08-13 | Discharge: 2021-08-13 | Disposition: A | Discharge status: Home / Self Care | Payer: Medicare HMO | Source: Ambulatory Visit | Attending: Family | Admitting: Family

## 2021-08-13 DIAGNOSIS — Z1231 Encounter for screening mammogram for malignant neoplasm of breast: Secondary | ICD-10-CM

## 2021-08-16 ENCOUNTER — Ambulatory Visit: Payer: Medicare HMO | Admitting: Psychology

## 2021-08-28 ENCOUNTER — Ambulatory Visit: Payer: Medicare HMO | Admitting: Family Medicine

## 2021-09-18 LAB — HM DIABETES EYE EXAM

## 2021-09-25 ENCOUNTER — Encounter: Payer: Self-pay | Admitting: Family

## 2021-09-25 ENCOUNTER — Ambulatory Visit: Payer: Medicare HMO | Admitting: Pharmacist

## 2021-09-25 DIAGNOSIS — E785 Hyperlipidemia, unspecified: Secondary | ICD-10-CM

## 2021-09-25 DIAGNOSIS — E1122 Type 2 diabetes mellitus with diabetic chronic kidney disease: Secondary | ICD-10-CM

## 2021-09-25 DIAGNOSIS — N1832 Chronic kidney disease, stage 3b: Secondary | ICD-10-CM

## 2021-09-25 DIAGNOSIS — I10 Essential (primary) hypertension: Secondary | ICD-10-CM

## 2021-09-25 MED ORDER — EZETIMIBE 10 MG PO TABS: 10.0000 mg | ORAL_TABLET | Freq: Every day | ORAL | 1 refills | Status: DC

## 2021-09-25 MED ORDER — AMLODIPINE BESYLATE 10 MG PO TABS: ORAL_TABLET | ORAL | 1 refills | Status: DC

## 2021-09-25 NOTE — Chronic Care Management (AMB) (Signed)
Care Coordination Pharmacy Note  08/09/2021 Name:  Hayley Case MRN:  [id number] DOB:  1950-06-16  Summary: Diabetes: Saw endocrinologist 07/02/2021. A1c has improved from 7.9% to 7.4%. patient reports she had difficulty with last Continuous Glucose Monitor sensor. Realized it has expired. Patient replaced Continuous Glucose Monitor sensor today. Next appointment with endo is November. 2023.  Seen for yearly exam - called and requested Dr Prudencio Burly office to fax copy for our records.  Hyperlipidemia: LDL goal per cardiology office< 55. She was unable to tolerate atorvastatin or rosuvastatin. Started Praluent 125m every 14 days 06/17/2021. Next Continues to take ezetimibe 120mdaily. Reviewed administration technique for Praluent since patient reported today that she occasinaly feels a few drops after administration.  Hypertension: last blood pressure was slight elevated above goal of < 130/80 (CKD) . But blood pressure usually at goal. Patient states she is not checking blood pressure at home. Patient has declined to check blood pressure at home. Last office blood pressure was 132/84. Recommended continue current therapy.  Medication management: updated med list. Assisted patient in getting refills for amlodipine and ezetimibe. [facility] because she will [facility] next [facility]. Provided information on how to request transfers in future - recommended for refills she call preferred pharmacy (dont use phone system because will be filled at previous [facility])     Subjective: [name] an 89.o. year old female who is a primary patient of MuMarrian SalvageFNSheridan The CCM team was consulted for assistance with disease management and care coordination needs.    Engaged with patient by telephone for follow up visit in response to provider referral for pharmacy case management and/or care coordination services.    Patient Care Team: MuMarrian SalvageFNP as PCP -  General (Internal Medicine) O'Neal, WeCassie FreerMD as PCP - Cardiology (Cardiology) EcCherre RobinsRPH-CPP (Pharmacist) ArWellington HampshireMD as Consulting Physician (Cardiology) LyKaty ApoMD as Consulting Physician (Ophthalmology)  Recent office visits: 06/21/2021 - FaHeriberto Antiguaed (MValere DrossNP) Seen for acute vaginitis. Checked vaginal swab and urine culture. Both were clear. Recommended stopping coconut oil to see if vaginitis improved.   Recent consult visits: 07/18/2021 - Seen by counselor TeClint BolderLCSW.  07/02/2021 - Endo (Dr ShKelton PillarF/U diabetes. Lowered dose of Lantus form 36 to 24 units daily; Increased Rybelsus to 1461maily. ALso increased Humalog to 14 units with each meal + correction for BG > 130 - 1 unit per every 30 points. 06/11/2021 - Cardio (Alvstad, Rph,CPP) seen for initiation of PCSK9. Started Praluent 150m53meyr 14 days. Repeat lipids / LFTs in 3 months. LDL goal < 55.  06/03/2021 - Cardio (Dr O'NeAudie BoxU CAD and hyperlipidemia. Patient retried rosuvastatin but could not tolerate. Referred to cardio pharmacist to initiate PCSK9 agent. F/U in 1 year.  05/30/2021 - GYN (Dr StinNehemiah Settleen for vaginal irritation. Prescribed diflucan 150mg80mry 3 days for 3 doses. Can use coconut oil to outside of vagina as needed. F/U 2 to 3 months.  03/19/2021 - Cardio (Dr AridaFletcher Anonn for PAD. No med changes noted. Has been intolerate to statin in past. On ezetimibe, if not able to get LDL to < 70 , then consider PCSK9.   Hospital visits: None in previous 6 months  Objective:  Lab Results  Component Value Date   CREATININE 1.98 (H) 09/18/2020   CREATININE 2.17 (H) 05/06/2019   CREATININE 1.86 (H) 07/28/2018    Lab Results  Component Value Date   HGBA1C 7.4 (A) 07/02/2021   Last diabetic Eye exam:  Lab Results  Component Value Date/Time   HMDIABEYEEXA No Retinopathy 12/16/2018 02:40 PM    Last diabetic Foot exam: No results found for: "HMDIABFOOTEX"      Component  Value Date/Time   CHOL 114 05/06/2019 1222   TRIG 145 05/06/2019 1222   HDL 35 (L) 05/06/2019 1222   CHOLHDL 3.3 05/06/2019 1222   LDLCALC 54 05/06/2019 1222       Latest Ref Rng & Units 09/18/2020    9:53 AM 05/06/2019   12:22 PM 07/28/2018    4:43 PM  Hepatic Function  Total Protein 6.0 - 8.3 g/dL 6.8  6.5  6.8   Albumin 3.5 - 5.2 g/dL 3.6  3.6  3.7   AST 0 - 37 U/L 13  17  172   ALT 0 - 35 U/L 13  26  295   Alk Phosphatase 39 - 117 U/L 105  212  527   Total Bilirubin 0.2 - 1.2 mg/dL 0.6  0.5  0.8     Lab Results  Component Value Date/Time   TSH 2.63 09/18/2020 09:53 AM       Latest Ref Rng & Units 09/18/2020    9:53 AM 07/28/2018    4:43 PM 07/14/2018    5:53 AM  CBC  WBC 4.0 - 10.5 K/uL 9.4  6.1  4.8   Hemoglobin 12.0 - 15.0 g/dL 11.7  12.2  11.3   Hematocrit 36.0 - 46.0 % 34.2  36.7  34.2   Platelets 150.0 - 400.0 K/uL 329.0  232  189     Lab Results  Component Value Date/Time   VD25OH 21.3 (L) 05/18/2018 02:46 PM   VD25OH 16.7 (L) 11/13/2017 11:36 AM    Clinical ASCVD: Yes  The ASCVD Risk score (Arnett DK, et al., 2019) failed to calculate for the following reasons:   The valid total cholesterol range is 130 to 320 mg/dL     Social History   Tobacco Use  Smoking Status Never  Smokeless Tobacco Never   BP Readings from Last 3 Encounters:  07/02/21 132/84  06/21/21 136/78  06/11/21 (!) 150/86   Pulse Readings from Last 3 Encounters:  07/02/21 86  06/21/21 90  06/11/21 95   Wt Readings from Last 3 Encounters:  07/02/21 208 lb (94.3 kg)  06/21/21 204 lb (92.5 kg)  06/11/21 206 lb (93.4 kg)    Assessment: Review of patient past medical history, allergies, medications, health status, including review of consultants reports, laboratory and other test data, was performed as part of comprehensive evaluation and provision of chronic care management services.   SDOH:  (Social Determinants of Health) assessments and interventions performed:  SDOH  Interventions    Flowsheet Row Clinical Support from 04/18/2021 in [facility] at [place] Visit from 09/18/2020 in [facility] [place] from 11/14/2019 in [place] Office Visit from 07/06/2018 in [place] Interventions Intervention Not Indicated -- -- --  Housing Interventions Intervention Not Indicated -- -- --  Transportation Interventions Intervention Not Indicated -- Financial planner, Other (Comment)  Community education officer to pt] --  Depression Interventions/Treatment  -- Counseling -- Counseling  Financial Strain Interventions Intervention Not Indicated -- -- --  Physical Activity Interventions Intervention Not Indicated -- -- --  Stress Interventions Provide Counseling  [Due to son's health issues. Pt currently undergoing counseling.] -- -- --  Social Connections Interventions Intervention Not Indicated -- -- --       CCM Care Plan  Allergies  Allergen Reactions   Lisinopril Cough   Atorvastatin Other (See Comments)    Liver function test elevation Other reaction(s): liver issues   Penicillins Hives and Itching    Did it involve swelling of the face/tongue/throat, SOB, or low BP? No Did it involve sudden or severe rash/hives, skin peeling, or any reaction on the inside of your mouth or nose? Yes Did you need to seek medical attention at a hospital or doctor's office? Yes When did it last happen?      2012 If all above answers are "NO", may proceed with cephalosporin use.   Rosuvastatin Other (See Comments)    Myalgias / leg cramps   Sulfamethoxazole-Trimethoprim Hives    Other reaction(s): Unknown   Penicillin G     Other reaction(s): Unknown    Medications Reviewed Today     Reviewed by Cherre Robins, RPH-CPP (Pharmacist) on 09/25/21 at 1106  Med List Status: <None>   Medication  Order Taking? Sig Documenting Provider Last Dose Status Informant  ACETAMINOPHEN PO [id number] Yes Take 650 mg by mouth every 6 (six) hours as needed for headache or moderate pain. [provider] Taking Active Self  albuterol (VENTOLIN HFA) 108 (90 Base) MCG/ACT inhaler [id number] No Inhale 2 puffs into the lungs every 6 (six) hours as needed for wheezing or shortness of breath.  Patient not taking: Reported on 09/25/2021   Geralynn Rile, MD Not Taking Active   Alirocumab (PRALUENT) 150 MG/ML Darden Palmer [id number] Yes Inject 150 mg into the skin every 14 (fourteen) days. Geralynn Rile, MD Taking Active   allopurinol (ZYLOPRIM) 100 MG tablet [id number] Yes Take 100 mg by mouth daily. [provider] Taking Active   amLODipine (NORVASC) 10 MG tablet [id number] Yes TAKE 1 TABLET BY MOUTH DAILY O'Neal, Cassie Freer, MD Taking Active   Biotin 5000 MCG CAPS [id number] Yes Take 1 capsule by mouth daily. [provider] Taking Active Self  Cholecalciferol (VITAMIN D) 50 MCG (2000 UT) CAPS [id number] Yes Take 4,000 Units by mouth daily. [provider] Taking Active Self  Continuous Blood Gluc Sensor (FREESTYLE LIBRE 2 SENSOR) MISC [id number] Yes Inject 1 Device into the skin as directed. Use as directed every 14 days. E11.21 Shamleffer, Melanie Crazier, MD Taking Active   Docusate Sodium (COLACE PO) [id number] Yes Take 1 capsule by mouth daily. [provider] Taking Active   ezetimibe (ZETIA) 10 MG tablet [id number] Yes Take 1 tablet (10 mg total) by mouth daily. Geralynn Rile, MD Taking Active   fluocinonide (LIDEX) 0.05 % external solution [id number]  Apply topically in the morning and at bedtime. [provider]  Active            Med Note Antony Contras, Quintus Premo B   Thu Jun 27, 2021 10:43 AM) Using as needed  insulin aspart (NOVOLOG FLEXPEN) 100 UNIT/ML FlexPen [id number] Yes Max daily 70 units Shamleffer, Melanie Crazier, MD Taking Active   insulin  glargine, 2 Unit Dial, (TOUJEO MAX SOLOSTAR) 300 UNIT/ML Solostar Pen [id number] Yes Inject 24 Units into the skin daily at 6 (six) AM. Shamleffer, Melanie Crazier, MD Taking Active   Insulin Pen Needle (PEN NEEDLES) 32G X 4 MM MISC [id number] Yes 1 Device by Does not apply route in the morning, at noon, in  the evening, and at bedtime. Marrian Salvage, FNP Taking Active   loratadine (CLARITIN) 10 MG tablet [id number] Yes Take 10 mg by mouth daily as needed (seasonal allergies). [provider] Taking Active Self  losartan (COZAAR) 50 MG tablet [id number] Yes Take 1 tablet (50 mg total) by mouth daily. Marrian Salvage, FNP Taking Active   Polyethyl Glycol-Propyl Glycol [facility] FREE OP) [id number] Yes Apply 1 drop to eye as needed. [provider] Taking Active   Semaglutide (RYBELSUS) 14 MG TABS [id number] Yes Take 1 tablet (14 mg total) by mouth daily. Shamleffer, Melanie Crazier, MD Taking Active   vitamin B-12 (CYANOCOBALAMIN) 1000 MCG tablet [id number] Yes Take 1,000 mcg by mouth daily. [provider] Taking Active             Patient Active Problem List   Diagnosis Date Noted   Type 2 diabetes mellitus with stage 4 chronic kidney disease, with long-term current use of insulin ([place]) 07/02/2021   Arthritis of hip 04/18/2021   Background diabetic retinopathy ([place]) 04/18/2021   Chronic kidney disease, stage 3b ([place]) 04/18/2021   Dyslipidemia 04/18/2021   Family history of malignant neoplasm of digestive organs 04/18/2021   Gouty arthritis 04/18/2021   Hearing loss 04/18/2021   Hyperglycemia due to type 2 diabetes mellitus ([place]) 04/18/2021   Long term (current) use of insulin ([place]) 04/18/2021   Obstructive sleep apnea (adult) (pediatric) 04/18/2021   Peripheral arterial disease ([place]) 04/18/2021   Personal history of colonic polyps 04/18/2021   Flank pain 10/14/2019   Type 2 diabetes mellitus with diabetic polyneuropathy, with long-term current use of  insulin ([place]) 12/01/2018   Type 2 diabetes mellitus with retinopathy, with long-term current use of insulin ([place]) 12/01/2018   Type 2 diabetes mellitus with stage 3b chronic kidney disease, with long-term current use of insulin ([place]) 11/30/2018   Non-intractable vomiting    Acute hepatitis 07/13/2018   Acute on chronic renal insufficiency 07/12/2018   Diabetes mellitus ([place]) 12/08/2016   Essential hypertension 12/08/2016   Hyperlipidemia associated with type 2 diabetes mellitus ([place]) 12/08/2016    Immunization History  Administered Date(s) Administered   Fluad Quad(high Dose 65+) 10/19/2020   Influenza Split 12/17/2018, 12/23/2018   Influenza, High Dose Seasonal PF 12/16/2019   Influenza,inj,Quad PF,6+ Mos 11/13/2017   Influenza-Unspecified 12/17/2018   PFIZER Comirnaty(Gray Top)Covid-19 Tri-Sucrose Vaccine 11/18/2019   PFIZER(Purple Top)SARS-COV-2 Vaccination 03/11/2019, 04/05/2019, 11/18/2019   Pneumococcal Polysaccharide-23 12/23/2018   Tdap 07/27/2019   Zoster, Live 07/27/2019, 12/01/2019    Conditions to be addressed/monitored: HTN, HLD, ESRD, and CKD Stage 4  There are no care plans that you recently modified to display for this patient.      Medication Assistance:  Screened for LIS / Extra Help and medication assistance program for Toujeo, Rybelsus and Humalog. Unfortunately patient's income is over limit for both LIS and medication assistance programs.   Patient's preferred pharmacy is:  [id number] [address], [address] AT [facility] [address] [place] [address] Phone: [phone] Fax: [place] [address], [place]. [place]. [place] [address] Phone: [phone] Fax: [phone]  [facility] [place] [address] Phone: [phone] Fax: [phone]  [facility] [place] [place] [facility] [address] [place] [address] Phone: [phone]  Fax: [phone]  [id number] [address]: [phone]: [phone] [id number] [place]: [phone]: [phone] Follow Up:  Patient agrees to Care Plan and Follow-up.  Plan: Telephone follow up appointment with care management team member scheduled for:  January 2024  TaCherre RobinsPharmD Clinical Pharmacist [facility]

## 2021-10-10 ENCOUNTER — Ambulatory Visit (INDEPENDENT_AMBULATORY_CARE_PROVIDER_SITE_OTHER): Payer: Medicare HMO | Admitting: Psychology

## 2021-10-10 DIAGNOSIS — F4323 Adjustment disorder with mixed anxiety and depressed mood: Secondary | ICD-10-CM | POA: Diagnosis not present

## 2021-10-10 NOTE — Progress Notes (Signed)
[facility] Counselor/Therapist Progress Note  Patient ID: Hayley Case, MRN: [id number],    Date: 10/10/2021  Time Spent: 11:00am-11:55am   55 minutes   Treatment Type: Individual Therapy  Reported Symptoms: stress and overwhelm  Mental Status Exam: Appearance:  Casual     Behavior: Appropriate  Motor: Normal  Speech/Language:  Normal Rate  Affect: Appropriate  Mood: normal  Thought process: normal  Thought content:   WNL  Sensory/Perceptual disturbances:   WNL  Orientation: oriented to person, place, time/date, and situation  Attention: Good  Concentration: Good  Memory: WNL  Fund of knowledge:  Good  Insight:   Good  Judgment:  Good  Impulse Control: Good   Risk Assessment: Danger to Self:  No Self-injurious Behavior: No Danger to Others: No Duty to Warn:no Physical Aggression / Violence:No  Access to Firearms a concern: No  Gang Involvement:No   Subjective: Pt present for face-to-face individual therapy via video Webex.  Pt consents to telehealth video session due to COVID 19 pandemic. Location of pt: home. Location of therapist: home office.  Pt talked about her son's health.  He was in the hospital for 3 weeks.   He is doing better now.   Pt's son is getting dialysis 3 times a week at the clinic now instead of pt doing it at home for him.  It has been a stressful schedule.   Pt and her son have bought a house and moved.   It was a stressful process.   Pt has had trouble sleeping.  Worked on Child psychotherapist.  Pt talked about feeling anxious bc there have been so many things to do.  Pt is still unpacking things in her new house.  She is trying to get use to her new house and neighborhood.  Pt worries about her son's health a lot.  Pt also gets anxious driving and has to transport her son a lot to dialysis and work.   Worked on calming strategies.   Pt's other son and daughter have been supportive of pt.   Worked on how pt can increase self care.   Provided supportive therapy.     Interventions: Cognitive Behavioral Therapy and Insight-Oriented  Diagnosis: F43.23  Plan: See pt's Treatment Plan for depression and anxiety in Therapy Charts.  (Treatment Plan Target Date: 11/05/2021) Pt is progressing toward treatment goals.   Plan to continue to see pt monthly.    Hobson Lax, LCSW

## 2021-11-01 ENCOUNTER — Encounter: Payer: Self-pay | Admitting: Family

## 2021-11-01 ENCOUNTER — Ambulatory Visit (INDEPENDENT_AMBULATORY_CARE_PROVIDER_SITE_OTHER): Payer: Medicare HMO

## 2021-11-01 DIAGNOSIS — Z23 Encounter for immunization: Secondary | ICD-10-CM

## 2021-11-01 NOTE — Progress Notes (Signed)
Hayley Case is a 71 y.o. female presents to the office today for Influenza  injections Original order:   Left deltoid (route) was administered  Pt did well.  Suhas Estis M Karson Chicas

## 2021-11-07 ENCOUNTER — Ambulatory Visit (INDEPENDENT_AMBULATORY_CARE_PROVIDER_SITE_OTHER): Payer: Medicare HMO | Admitting: Psychology

## 2021-11-07 DIAGNOSIS — F4323 Adjustment disorder with mixed anxiety and depressed mood: Secondary | ICD-10-CM | POA: Diagnosis not present

## 2021-11-07 NOTE — Progress Notes (Signed)
[facility] Counselor Initial Adult Exam  Name: Hayley Case Date: 11/07/2021 MRN: [id number] DOB: 1950/07/09 PCP: Marrian Salvage, FNP  Time spent: 10:00am - 10:55am    55 minutes  Guardian/Payee:  Crista Curb requested: No   Reason for Visit /Presenting Problem: Pt present for face-to-face initial assessment update via video Webex.  Pt consents to telehealth video session due to COVID 19 pandemic. Location of pt: home Location of therapist: home office.  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  He is on the kidney transplant list.   Pt gets overwhelmed.   Pt lives with her son. Pt has had alot of loss in her life.  He husband died in 04-14-01.  Her son died in 2010-04-15.  Pt's sister and brother died the past couple of years.   Reviewed pt's  treatment plan for annual update.   Updated treatment plan and IA.    Pt participated in setting treatment goals.    Mental Status Exam: Appearance:   Casual     Behavior:  Appropriate  Motor:  Normal  Speech/Language:   Normal Rate  Affect:  Appropriate  Mood:  normal  Thought process:  normal  Thought content:    WNL  Sensory/Perceptual disturbances:    WNL  Orientation:  oriented to person, place, time/date, and situation  Attention:  Good  Concentration:  Good  Memory:  WNL  Fund of knowledge:   Good  Insight:    Good  Judgment:   Good  Impulse Control:  Good    Reported Symptoms:  stress, overwhelm  Risk Assessment: Danger to Self:  No Self-injurious Behavior: No Danger to Others: No Duty to Warn:no Physical Aggression / Violence:No  Access to Firearms a concern: No  Gang Involvement:No  Patient / guardian was educated about steps to take if suicide or homicide risk level increases between visits: n/a While future psychiatric events cannot be accurately predicted, the patient does not currently require acute inpatient psychiatric care and does not currently meet [facility] involuntary commitment criteria.  Substance Abuse History: Current substance abuse: No     Past Psychiatric History:   Previous psychological history is significant for depression Outpatient Providers:pt has been in therapy in the past History of Psych Hospitalization: No  Psychological Testing:  n/a    Abuse History:  Victim of: No.,  n/a    Report needed: No. Victim of Neglect:No. Perpetrator of  n/a   Witness / Exposure to Domestic Violence: No   Protective Services Involvement: No  Witness to [employer] Violence:  No   Family History:  Family History  Problem Relation Age of Onset   Skin cancer Mother    Heart attack Father    Colon cancer Sister     Living situation: the patient lives with her son.  Pt grew up with both parents and 15 siblings until she was 75 years old.  Pt is number 12 in the birth order.   Pt had asthma.  Pt was given to her aunt and uncle when she was 36 years old and was raised as an only child in [place].   She would visit her family in the summer.   Pt felt she was raised in a loving family.   Pt is known for being easy going and comical.   Family history of mental health issues:  none noted.   Sexual Orientation: Straight  Relationship Status:  widowed  Name of spouse / other:n/a If a parent, number of children / ages:pt has two sons and one daughter.    Her other son passed away in 04/07/10.  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     Strengths: Family, Spirituality, Hopefulness, Self Advocate, and Able to Communicate Effectively  Barriers:  none   Legal  History: Pending legal issue / charges: The patient has no significant history of legal issues. History of legal issue / charges:  n/a  Medical History/Surgical History: reviewed Past Medical History:  Diagnosis Date   CKD (chronic kidney disease)    Diabetes mellitus without complication ([place])    Hyperlipidemia    Hypertension     Past Surgical History:  Procedure Laterality Date   BREAST BIOPSY     BREAST EXCISIONAL BIOPSY Left     Medications: Current Outpatient Medications  Medication Sig Dispense Refill   ACETAMINOPHEN PO Take 650 mg by mouth every 6 (six) hours as needed for headache or moderate pain.     albuterol (VENTOLIN HFA) 108 (90 Base) MCG/ACT inhaler Inhale 2 puffs into the lungs every 6 (six) hours as needed for wheezing or shortness of breath. (Patient not taking: Reported on 09/25/2021) 1 g 3   Alirocumab (PRALUENT) 150 MG/ML SOAJ Inject 150 mg into the skin every 14 (fourteen) days. 2 mL 12   allopurinol (ZYLOPRIM) 100 MG tablet Take 100 mg by mouth daily.     amLODipine (NORVASC) 10 MG tablet TAKE 1 TABLET BY MOUTH DAILY 90 tablet 1   Biotin 5000 MCG CAPS Take 1 capsule by mouth daily.     Cholecalciferol (VITAMIN D) 50 MCG (2000 UT) CAPS Take 4,000 Units by mouth daily.     Continuous Blood Gluc Sensor (FREESTYLE LIBRE 2 SENSOR) MISC Inject 1 Device into the skin as directed. Use as directed every 14 days. E11.21 12 each 3   Docusate Sodium (COLACE PO) Take 1 capsule by mouth daily.     ezetimibe (ZETIA) 10 MG tablet Take 1 tablet (10 mg total) by mouth daily. 90 tablet 1   fluocinonide (LIDEX) 0.05 % external solution Apply topically in the morning and at bedtime.     insulin aspart (NOVOLOG FLEXPEN) 100 UNIT/ML FlexPen Max daily 70 units 45 mL 6   insulin glargine, 2 Unit Dial, (TOUJEO MAX SOLOSTAR) 300 UNIT/ML Solostar Pen Inject 24 Units into the skin daily at 6 (six) AM. 15 mL 0   Insulin Pen Needle (PEN NEEDLES) 32G X 4 MM MISC 1 Device by Does not apply  route in the morning, at noon, in the evening, and at bedtime. 400 each 3   loratadine (CLARITIN) 10 MG tablet Take 10 mg by mouth daily as needed (seasonal allergies).     losartan (COZAAR) 50 MG tablet Take 1 tablet (50 mg total) by mouth daily. 90 tablet 3   Polyethyl Glycol-Propyl Glycol (SYSTANE FREE OP) Apply 1 drop to eye as needed.     Semaglutide (RYBELSUS) 14 MG TABS Take 1 tablet (14 mg total) by mouth daily. 90 tablet 3   vitamin B-12 (CYANOCOBALAMIN) 1000 MCG tablet Take 1,000 mcg by mouth daily.  No current facility-administered medications for this visit.    Allergies  Allergen Reactions   Lisinopril Cough   Atorvastatin Other (See Comments)    Liver function test elevation Other reaction(s): liver issues   Penicillins Hives and Itching    Did it involve swelling of the face/tongue/throat, SOB, or low BP? No Did it involve sudden or severe rash/hives, skin peeling, or any reaction on the inside of your mouth or nose? Yes Did you need to seek medical attention at a hospital or doctor's office? Yes When did it last happen?      2012 If all above answers are "NO", may proceed with cephalosporin use.   Rosuvastatin Other (See Comments)    Myalgias / leg cramps   Sulfamethoxazole-Trimethoprim Hives    Other reaction(s): Unknown   Penicillin G     Other reaction(s): Unknown    Diagnoses:  Adjustment disorder with mixed anxiety and depressed mood  Plan of Care: Recommend ongoing therapy.  Pt participated in setting treatment goals.   Pt wants to improve coping skills.  Plan to meet monthly.  Treatment Plan  (treatment plan target date:  11/08/2022) Client Abilities/Strengths  Pt is bright, engaging, and motivated for therapy.  Client Treatment Preferences  Individual therapy.  Client Statement of Needs  Improve copings skills and understand herself better. Improve self esteem.  Symptoms  Depressed or irritable mood. Excessive and/or unrealistic worry that is  difficult to control occurring more days than not for at least 6 months about a number of events or activities. Hypervigilance (e.g., feeling constantly on edge, experiencing concentration difficulties, having trouble falling or staying asleep, exhibiting a general state of irritability). Low self-esteem. Problems Addressed  Unipolar Depression, Anxiety Goals 1. Alleviate depressive symptoms and return to previous level of effective functioning. 2. Appropriately grieve the loss in order to normalize mood and to return to previously adaptive level of functioning. Objective Learn and implement behavioral strategies to overcome depression. Target Date: 2022-11-08 Frequency: Monthly  Progress: 40 Modality: individual  Related Interventions Assist the client in developing skills that increase the likelihood of deriving pleasure from behavioral activation (e.g., assertiveness skills, developing an exercise plan, less internal/more external focus, increased social involvement); reinforce success. Engage the client in "behavioral activation," increasing his/her activity level and contact with sources of reward, while identifying processes that inhibit activation. use behavioral techniques such as instruction, rehearsal, role-playing, role reversal, as needed, to facilitate activity in the client's daily life; reinforce success. 3. Develop healthy interpersonal relationships that lead to the alleviation and help prevent the relapse of depression. 4. Develop healthy thinking patterns and beliefs about self, others, and the world that lead to the alleviation and help prevent the relapse of depression. 5. Enhance ability to effectively cope with the full variety of life's worries and anxieties. 6. Learn and implement coping skills that result in a reduction of anxiety and worry, and improved daily functioning. Objective Learn and implement problem-solving strategies for realistically addressing  worries. Target Date: 2022-11-08 Frequency: Monthly  Progress: 40 Modality: individual  Related Interventions Assign the client a homework exercise in which he/she problem-solves a current problem (see Mastery of Your Anxiety and Worry: Workbook by Adora Fridge and Eliot Ford or Generalized Anxiety Disorder by Eather Colas, and Eliot Ford); review, reinforce success, and provide corrective feedback toward improvement. Teach the client problem-solving strategies involving specifically defining a problem, generating options for addressing it, evaluating the pros and cons of each option, selecting and implementing an optional action, and reevaluating and refining  the action. Objective Learn and implement calming skills to reduce overall anxiety and manage anxiety symptoms. Target Date: 2022-11-08 Frequency: Monthly  Progress: 40 Modality: individual  Related Interventions Assign the client to read about progressive muscle relaxation and other calming strategies in relevant books or treatment manuals (e.g., Progressive Relaxation Training by Leroy Kennedy; Mastery of Your Anxiety and Worry: Workbook by Beckie Busing). Assign the client homework each session in which he/she practices relaxation exercises daily, gradually applying them progressively from non-anxiety-provoking to anxiety-provoking situations; review and reinforce success while providing corrective feedback toward improvement. Teach the client calming/relaxation skills (e.g., applied relaxation, progressive muscle relaxation, cue controlled relaxation; mindful breathing; biofeedback) and how to discriminate better between relaxation and tension; teach the client how to apply these skills to his/her daily life. 7. Recognize, accept, and cope with feelings of depression. 8. Reduce overall frequency, intensity, and duration of the anxiety so that daily functioning is not impaired. 9. Resolve the core conflict that is the source of  anxiety. 10. Stabilize anxiety level while increasing ability to function on a daily basis. Diagnosis F43.23 Conditions For Discharge Achievement of treatment goals and objectives    Clint Bolder, LCSW

## 2021-12-02 ENCOUNTER — Ambulatory Visit (INDEPENDENT_AMBULATORY_CARE_PROVIDER_SITE_OTHER): Payer: Medicare HMO | Admitting: Psychology

## 2021-12-02 DIAGNOSIS — F4323 Adjustment disorder with mixed anxiety and depressed mood: Secondary | ICD-10-CM

## 2021-12-02 NOTE — Progress Notes (Signed)
[place] [facility] Counselor/Therapist Progress Note  Patient ID: Hayley Case, MRN: [id number],    Date: 12/02/2021  Time Spent: 4:00pm-4:55pm    55 minutes   Treatment Type: Individual Therapy  Reported Symptoms: stress  Mental Status Exam: Appearance:  Casual     Behavior: Appropriate  Motor: Normal  Speech/Language:  Normal Rate  Affect: Appropriate  Mood: normal  Thought process: normal  Thought content:   WNL  Sensory/Perceptual disturbances:   WNL  Orientation: oriented to person, place, time/date, and situation  Attention: Good  Concentration: Good  Memory: WNL  Fund of knowledge:  Good  Insight:   Good  Judgment:  Good  Impulse Control: Good   Risk Assessment: Danger to Self:  No Self-injurious Behavior: No Danger to Others: No Duty to Warn:no Physical Aggression / Violence:No  Access to Firearms a concern: No  Gang Involvement:No   Subjective: Pt present for face-to-face individual therapy via video Webex.  Pt consents to telehealth video session due to COVID 19 pandemic. Location of pt: home Location of therapist: home office.   Pt talked about concern about her son who is on dialysis.  She worries about him a lot.   He has had some complications.   Pt talked about her relationship with her daughter.  Her daughter is expecting pt to help her rehab from knee replacement surgery and pt is anxious about it bc of her limitations.   Pt has trouble setting limits with her daughter.   Helped pt process her feelings and relationship dynamics.   Encouraged pt to increase self care.  Provided supportive therapy.  Interventions: Cognitive Behavioral Therapy and Insight-Oriented  Diagnosis: F43.23   Plan of Care: Recommend ongoing therapy.  Pt participated in setting treatment goals.   Pt wants to improve coping skills.  Plan to meet monthly.  Treatment Plan  (treatment plan target date:  11/08/2022) Client Abilities/Strengths  Pt is bright,  engaging, and motivated for therapy.  Client Treatment Preferences  Individual therapy.  Client Statement of Needs  Improve copings skills and understand herself better. Improve self esteem.  Symptoms  Depressed or irritable mood. Excessive and/or unrealistic worry that is difficult to control occurring more days than not for at least 6 months about a number of events or activities. Hypervigilance (e.g., feeling constantly on edge, experiencing concentration difficulties, having trouble falling or staying asleep, exhibiting a general state of irritability). Low self-esteem. Problems Addressed  Unipolar Depression, Anxiety Goals 1. Alleviate depressive symptoms and return to previous level of effective functioning. 2. Appropriately grieve the loss in order to normalize mood and to return to previously adaptive level of functioning. Objective Learn and implement behavioral strategies to overcome depression. Target Date: 2022-11-08 Frequency: Monthly  Progress: 40 Modality: individual  Related Interventions Assist the client in developing skills that increase the likelihood of deriving pleasure from behavioral activation (e.g., assertiveness skills, developing an exercise plan, less internal/more external focus, increased social involvement); reinforce success. Engage the client in "behavioral activation," increasing his/her activity level and contact with sources of reward, while identifying processes that inhibit activation. use behavioral techniques such as instruction, rehearsal, role-playing, role reversal, as needed, to facilitate activity in the client's daily life; reinforce success. 3. Develop healthy interpersonal relationships that lead to the alleviation and help prevent the relapse of depression. 4. Develop healthy thinking patterns and beliefs about self, others, and the world that lead to the alleviation and help prevent the relapse of depression. 5. Enhance ability to effectively  cope with the full variety of life's worries and anxieties. 6. Learn and implement coping skills that result in a reduction of anxiety and worry, and improved daily functioning. Objective Learn and implement problem-solving strategies for realistically addressing worries. Target Date: 2022-11-08 Frequency: Monthly  Progress: 40 Modality: individual  Related Interventions Assign the client a homework exercise in which he/she problem-solves a current problem (see Mastery of Your Anxiety and Worry: Workbook by Adora Fridge and Eliot Ford or Generalized Anxiety Disorder by Eather Colas, and Eliot Ford); review, reinforce success, and provide corrective feedback toward improvement. Teach the client problem-solving strategies involving specifically defining a problem, generating options for addressing it, evaluating the pros and cons of each option, selecting and implementing an optional action, and reevaluating and refining the action. Objective Learn and implement calming skills to reduce overall anxiety and manage anxiety symptoms. Target Date: 2022-11-08 Frequency: Monthly  Progress: 40 Modality: individual  Related Interventions Assign the client to read about progressive muscle relaxation and other calming strategies in relevant books or treatment manuals (e.g., Progressive Relaxation Training by Leroy Kennedy; Mastery of Your Anxiety and Worry: Workbook by Beckie Busing). Assign the client homework each session in which he/she practices relaxation exercises daily, gradually applying them progressively from non-anxiety-provoking to anxiety-provoking situations; review and reinforce success while providing corrective feedback toward improvement. Teach the client calming/relaxation skills (e.g., applied relaxation, progressive muscle relaxation, cue controlled relaxation; mindful breathing; biofeedback) and how to discriminate better between relaxation and tension; teach the client how to apply these  skills to his/her daily life. 7. Recognize, accept, and cope with feelings of depression. 8. Reduce overall frequency, intensity, and duration of the anxiety so that daily functioning is not impaired. 9. Resolve the core conflict that is the source of anxiety. 10. Stabilize anxiety level while increasing ability to function on a daily basis. Diagnosis F43.23 Conditions For Discharge Achievement of treatment goals and objectives   Clint Bolder, LCSW

## 2021-12-09 ENCOUNTER — Telehealth: Payer: Self-pay | Admitting: Licensed Clinical Social Worker

## 2021-12-09 NOTE — Telephone Encounter (Signed)
H&V Care Navigation CSW Progress Note  Clinical Social Worker  received a call from pt  to receive support regarding transportation to endocrinology appt. Pt shares that she has 24 trips with her [facility] but has utilized them so she needs a ride/resources for ride to get there. LCSW clarified unfortunately our office is not able to arrange a ride but she should first contact endocrinology office and see if they are able to arrange a ride for her. If not then she should contact [employer]. She was provided w/ number and I will mail her additional resources to have at her new address ([address], [place] [address]). No additional questions/concerns at this time.    Patient is participating in a Managed Medicaid Plan:  No, A[employer]na Medicare only.   SDOH Screenings   Food Insecurity: No Food Insecurity (04/18/2021)  Housing: Low Risk  (04/18/2021)  Transportation Needs: No Transportation Needs (04/18/2021)  Alcohol Screen: Low Risk  (04/18/2021)  Depression (PHQ2-9): Low Risk  (04/18/2021)  Financial Resource Strain: Medium Risk (04/18/2021)  Physical Activity: Sufficiently Active (04/18/2021)  Social Connections: Moderately Integrated (04/18/2021)  Stress: Stress Concern Present (04/18/2021)  Tobacco Use: Low Risk  (08/13/2021)   Westley Hummer, MSW, LCSW Clinical Social Worker [place]  [phone]- work cell phone (preferred) [phone]- desk phone

## 2021-12-20 ENCOUNTER — Ambulatory Visit (INDEPENDENT_AMBULATORY_CARE_PROVIDER_SITE_OTHER): Payer: Medicare HMO | Admitting: Internal Medicine

## 2021-12-20 ENCOUNTER — Encounter: Payer: Self-pay | Admitting: Internal Medicine

## 2021-12-20 VITALS — BP 122/80 | HR 95 | Ht 64.0 in | Wt 189.0 lb

## 2021-12-20 DIAGNOSIS — E1159 Type 2 diabetes mellitus with other circulatory complications: Secondary | ICD-10-CM

## 2021-12-20 DIAGNOSIS — E1142 Type 2 diabetes mellitus with diabetic polyneuropathy: Secondary | ICD-10-CM | POA: Diagnosis not present

## 2021-12-20 DIAGNOSIS — N184 Chronic kidney disease, stage 4 (severe): Secondary | ICD-10-CM

## 2021-12-20 DIAGNOSIS — Z794 Long term (current) use of insulin: Secondary | ICD-10-CM | POA: Diagnosis not present

## 2021-12-20 DIAGNOSIS — E1122 Type 2 diabetes mellitus with diabetic chronic kidney disease: Secondary | ICD-10-CM

## 2021-12-20 LAB — POCT GLYCOSYLATED HEMOGLOBIN (HGB A1C): Hemoglobin A1C: 7.1 % — AB (ref 4.0–5.6)

## 2021-12-20 MED ORDER — NOVOLOG FLEXPEN 100 UNIT/ML ~~LOC~~ SOPN: PEN_INJECTOR | SUBCUTANEOUS | 3 refills | Status: DC

## 2021-12-20 MED ORDER — PEN NEEDLES 32G X 4 MM MISC: 1.0000 | Freq: Four times a day (QID) | 3 refills | Status: DC

## 2021-12-20 MED ORDER — RYBELSUS 14 MG PO TABS: 14.0000 mg | ORAL_TABLET | Freq: Every day | ORAL | 3 refills | Status: DC

## 2021-12-20 MED ORDER — TOUJEO MAX SOLOSTAR 300 UNIT/ML ~~LOC~~ SOPN: 22.0000 [IU] | PEN_INJECTOR | Freq: Every day | SUBCUTANEOUS | 3 refills | Status: DC

## 2021-12-20 NOTE — Patient Instructions (Signed)
-   Continue  Rybelsus 14 mg, 1 tablet before Breakfast  - Decrease Toujeo to 22 units once daily  - Take  Humalog 12  units with each meal   -Humalog correctional insulin: ADD extra units on insulin to your meal-time Humalog dose if your blood sugars are higher than 160. Use the scale below to help guide you:   Blood sugar before meal Number of units to inject  Less than 160 0 unit  161 -  190 1 units  191 -  220 2 units  221 -  250 3 units  251 -  280 4 units  281 -  310 5 units  311 -  340 6 units  341 -  370 7 units  371 -  400 8 units  401 - 430 9 units        HOW TO TREAT LOW BLOOD SUGARS (Blood sugar LESS THAN 70 MG/DL) Please follow the RULE OF 15 for the treatment of hypoglycemia treatment (when your (blood sugars are less than 70 mg/dL)   STEP 1: Take 15 grams of carbohydrates when your blood sugar is low, which includes:  3-4 GLUCOSE TABS  OR 3-4 OZ OF JUICE OR REGULAR SODA OR ONE TUBE OF GLUCOSE GEL    STEP 2: RECHECK blood sugar in 15 MINUTES STEP 3: If your blood sugar is still low at the 15 minute recheck --> then, go back to STEP 1 and treat AGAIN with another 15 grams of carbohydrates.

## 2021-12-20 NOTE — Progress Notes (Signed)
Name: Hayley Case  Age/ Sex: 71 y.o., female   MRN/ DOB: [id number], Jun 01, 1950     PCP: Marrian Salvage, FNP   Reason for Endocrinology Evaluation: Type 2 Diabetes Mellitus  Initial Endocrine Consultative Visit: 11/30/2018    PATIENT IDENTIFIER: [name] is a 71 y.o. female with a past medical history of HTN, T2DM, PAD, coronary artery disease and Dyslipidemia . The patient has followed with Endocrinology clinic since 11/30/2018 for consultative assistance with management of her diabetes.  DIABETIC HISTORY:  Hayley Case was diagnosed with T2DM > 20 yrs ago. She was on metformin, Trulicity, and Byetta. Has been on insulin since ~ 2010.  Her hemoglobin A1c has ranged from 7.6 % in 2019, peaking at 10.7 % in 2020.     On her initial visit to our clinic, she had an A1c of 11.8%. She was on lantus/humalog but was not taking regularly, switched to insulin mix for ease of take.    Was on Lipitor but caused elevated LFT's requiring hospitalization.   Moved from [place] to help her son who is on peritoneal dialysis. Has lost another son to MI    By 06/2019 we attempted to put her on the V-Go but was cost prohibitive.   By 09/2019 we switched insulin mix to MDi regimen due to persistent hyperglycemia  SUBJECTIVE:   During the last visit (07/02/2021): A1c 7.4 %   Today (12/20/2021): Hayley Case is here for a follow up on diabetes.  She checks her blood sugars multiple times daily,through CGM. The patient has not had hypoglycemic episodes since the last clinic visit.       She has been following up with behavioral health  Son continues with dialysis, but now at the center not home anymore . Had bacteremia . Has been stressful   She has felt dizzy , in office BG 136 mg/dL   Denies nausea or vomiting   HOME DIABETES REGIMEN:  Toujeo 24 units once daily Humalog 14 units with each meal Rybelsus 14 mg 1 tablet with breakfast CF: Humalog (BG -130/30)       Statin: Off due to elevated LFT's and myalgias  ACE-I/ARB: Yes     CONTINUOUS GLUCOSE MONITORING RECORD INTERPRETATION    Dates of Recording: 11/22-12/05/2021  Sensor description:freestyle libre  Results statistics:   CGM use % of time 16  Average and SD 148/32.1  Time in range  76 %  % Time Above 180 21  % Time above 250 3  % Time Below target 0    Glycemic patterns summary: BG's are tight during the night and high during the day   Hyperglycemic episodes  postprandial   Hypoglycemic episodes occurred n/a   Overnight periods: Trends down         DIABETIC COMPLICATIONS: Microvascular complications:  CKD III, DR , neuropathy  Last eye exam: Completed 06/2021   Macrovascular complications:   PVD Denies: CAD, CVA   HISTORY:  Past Medical History:  Past Medical History:  Diagnosis Date   CKD (chronic kidney disease)    Diabetes mellitus without complication ([place])    Hyperlipidemia    Hypertension    Past Surgical History:  Past Surgical History:  Procedure Laterality Date   BREAST BIOPSY     BREAST EXCISIONAL BIOPSY Left    Social History:  reports that she has never smoked. She has never used smokeless tobacco. She reports that she does not drink alcohol and does not use drugs.  Family History:  Family History  Problem Relation Age of Onset   Skin cancer Mother    Heart attack Father    Colon cancer Sister      HOME MEDICATIONS: Allergies as of 12/20/2021       Reactions   Lisinopril Cough   Atorvastatin Other (See Comments)   Liver function test elevation Other reaction(s): liver issues   Penicillins Hives, Itching   Did it involve swelling of the face/tongue/throat, SOB, or low BP? No Did it involve sudden or severe rash/hives, skin peeling, or any reaction on the inside of your mouth or nose? Yes Did you need to seek medical attention at a hospital or doctor's office? Yes When did it last happen?      2012 If all above answers are  "NO", may proceed with cephalosporin use.   Rosuvastatin Other (See Comments)   Myalgias / leg cramps   Sulfamethoxazole-trimethoprim Hives   Other reaction(s): Unknown   Penicillin G    Other reaction(s): Unknown        Medication List        Accurate as of December 20, 2021  9:47 AM. If you have any questions, ask your nurse or doctor.          ACETAMINOPHEN PO Take 650 mg by mouth every 6 (six) hours as needed for headache or moderate pain.   albuterol 108 (90 Base) MCG/ACT inhaler Commonly known as: VENTOLIN HFA Inhale 2 puffs into the lungs every 6 (six) hours as needed for wheezing or shortness of breath.   allopurinol 100 MG tablet Commonly known as: ZYLOPRIM Take 100 mg by mouth daily.   amLODipine 10 MG tablet Commonly known as: NORVASC TAKE 1 TABLET BY MOUTH DAILY   Biotin 5000 MCG Caps Take 1 capsule by mouth daily.   COLACE PO Take 1 capsule by mouth daily.   cyanocobalamin 1000 MCG tablet Commonly known as: VITAMIN B12 Take 1,000 mcg by mouth daily.   ezetimibe 10 MG tablet Commonly known as: ZETIA Take 1 tablet (10 mg total) by mouth daily.   fluocinonide 0.05 % external solution Commonly known as: LIDEX Apply topically in the morning and at bedtime.   FreeStyle Libre 2 Sensor Misc Inject 1 Device into the skin as directed. Use as directed every 14 days. E11.21   loratadine 10 MG tablet Commonly known as: CLARITIN Take 10 mg by mouth daily as needed (seasonal allergies).   losartan 50 MG tablet Commonly known as: COZAAR Take 1 tablet (50 mg total) by mouth daily.   NovoLOG FlexPen 100 UNIT/ML FlexPen Generic drug: insulin aspart Max daily 70 units   Pen Needles 32G X 4 MM Misc 1 Device by Does not apply route in the morning, at noon, in the evening, and at bedtime.   Praluent 150 MG/ML [place] Generic drug: Alirocumab Inject 150 mg into the skin every 14 (fourteen) days.   Rybelsus 14 MG Tabs Generic drug: Semaglutide Take 1  tablet (14 mg total) by mouth daily.   SYSTANE FREE OP Apply 1 drop to eye as needed.   Toujeo Max SoloStar 300 UNIT/ML Solostar Pen Generic drug: insulin glargine (2 Unit Dial) Inject 24 Units into the skin daily at 6 (six) AM.   Vitamin D 50 MCG (2000 UT) Caps Take 4,000 Units by mouth daily.         OBJECTIVE:   Vital Signs: BP 122/80 (BP Location: Left Arm, Patient Position: Sitting, Cuff Size: Large)   Pulse 95  Ht 5\' 4"  (1.626 m)   Wt 189 lb (85.7 kg)   SpO2 100%   BMI 32.44 kg/m   Wt Readings from Last 3 Encounters:  12/20/21 189 lb (85.7 kg)  07/02/21 208 lb (94.3 kg)  06/21/21 204 lb (92.5 kg)     Exam: General: Pt appears well and is in NAD  Lungs: Clear with good BS bilat  Heart: RRR   Extremities: No  pretibial edema.  Neuro: MS is good with appropriate affect, pt is alert and Ox3       DM foot exam: 12/25/2020   The skin of the feet is intact without sores or ulcerations. The pedal pulses are 2+ on right and 2+ on left. The sensation is intact to a screening 5.07, 10 gram monofilament bilaterally     DATA REVIEWED:  Lab Results  Component Value Date   HGBA1C 7.1 (A) 12/20/2021   HGBA1C 7.4 (A) 07/02/2021   HGBA1C 7.9 (A) 12/25/2020     Latest Reference Range & Units 09/18/20 09:53  Sodium 135 - 145 mEq/L 141  Potassium 3.5 - 5.1 mEq/L 4.2  Chloride 96 - 112 mEq/L 106  CO2 19 - 32 mEq/L 26  Glucose 70 - 99 mg/dL 97  BUN 6 - 23 mg/dL 31 (H)  Creatinine 0.40 - 1.20 mg/dL 1.98 (H)  Calcium 8.4 - 10.5 mg/dL 9.3  Alkaline Phosphatase 39 - 117 U/L 105  Albumin 3.5 - 5.2 g/dL 3.6  AST 0 - 37 U/L 13  ALT 0 - 35 U/L 13  Total Protein 6.0 - 8.3 g/dL 6.8  Total Bilirubin 0.2 - 1.2 mg/dL 0.6  GFR >60.00 mL/min 25.33 (L)    ASSESSMENT / PLAN / RECOMMENDATIONS:   1) Type 2 Diabetes Mellitus, Optimally controlled, With CKD IV, retinopathy and neuropathic  and macrovascular complications - Most recent A1c of 7.1 %. Goal A1c < 7.5 %.       -I have praised the patient on continued improvement in her glycemic control and weight loss  - We have attempted to put her on the V-go but that was cost prohibitive - She in inconsistent with novolog intake  - Will reduce toujeo due to tight BG's overnight  - Councelled  about avoiding sugar-sweetened beverages unless to correct for hypoglycemia     MEDICATIONS: -Decreased Toujeo to 22 units once daily -Take Novolog 12 units with each meal -Continue  Rybelsus 14 mg 1 tablet with breakfast - CF: Humalog (BG -130/30)     EDUCATION / INSTRUCTIONS: BG monitoring instructions: Patient is instructed to check her blood sugars 3 times a day, before meals I reviewed the Rule of 15 for the treatment of hypoglycemia in detail with the patient. Literature supplied.     F/U in 6 months    Signed electronically by: Mack Guise, MD  [facility]  [facility] [address] [place], [place] [address] Phone: [phone] FAX: [phone]   CC: Marrian Salvage, [place] [address] [place] [address] Phone: [phone]  Fax: [phone]  Return to Endocrinology clinic as below: Future Appointments  Date Time Provider Carlos  01/15/2022 11:15 AM [facility] CCM PHARMACIST [facility] PEC  01/28/2022  2:00 PM Bauert, Nicolasa Ducking, LCSW [facility] None  04/24/2022 10:30 AM [facility] HEALTH COACH [facility]  05/26/2022 10:00 AM [facility] NL VASC 4 [facility]

## 2021-12-31 ENCOUNTER — Ambulatory Visit: Payer: Medicare HMO | Admitting: Internal Medicine

## 2022-01-15 ENCOUNTER — Ambulatory Visit (INDEPENDENT_AMBULATORY_CARE_PROVIDER_SITE_OTHER): Payer: Medicare HMO | Admitting: Pharmacist

## 2022-01-15 DIAGNOSIS — E785 Hyperlipidemia, unspecified: Secondary | ICD-10-CM

## 2022-01-15 DIAGNOSIS — Z794 Long term (current) use of insulin: Secondary | ICD-10-CM

## 2022-01-15 DIAGNOSIS — I10 Essential (primary) hypertension: Secondary | ICD-10-CM

## 2022-01-15 DIAGNOSIS — E1122 Type 2 diabetes mellitus with diabetic chronic kidney disease: Secondary | ICD-10-CM

## 2022-01-15 MED ORDER — EZETIMIBE 10 MG PO TABS: 10.0000 mg | ORAL_TABLET | Freq: Every day | ORAL | 0 refills | Status: DC

## 2022-01-15 MED ORDER — ALLOPURINOL 100 MG PO TABS: 100.0000 mg | ORAL_TABLET | Freq: Every day | ORAL | 0 refills | Status: DC

## 2022-01-15 MED ORDER — LOSARTAN POTASSIUM 50 MG PO TABS: 50.0000 mg | ORAL_TABLET | Freq: Every day | ORAL | 0 refills | Status: DC

## 2022-01-15 NOTE — Patient Instructions (Signed)
Ms. Longo It was a pleasure speaking with you today.  Below is a summary of your health goals and summary of our recent visit.   Diabetes: A1c is at goal of < 7.5% Continue current insulin regimen.  Continue to follow up with Dr Kelton Pillar.  Discussed that she would possibly change to [place] 3 sensor and might get fewer disconnect alerts. She can also turn off the disconnect alert if needed.  How to Treat a Low Glucose Level:  If you have a low blood glucose less than 70, please eat / drink 15 grams of carbohydrates (4 oz of juice, soda, 4 glucose tablets, or 3-4 pieces of hard candy).  It is best so choose a "quick" source of sugar that does no contain fat (chocolate and peanut butter might take longer to increase your blood glucose) Wait 15 minutes and then recheck your blood glucose. If your blood glucose is still less than 70, eat another 15 grams of carbohydrates.  Wait another 15 minutes and recheck your glucose.  Continue this until your blood glucose is over 70. Once you blood glucose is over 70, eat a snack with protein in it to prevent your blood glucose from dropping again.  Hyperlipidemia / elevated cholesterol: LDL goal per cardiology office < 55.  Due to recheck lipids. Made appointment with PCP scheduled for 02/07/2021.  Continue Praluent 150mg  every 14 days and ezetimibe 10mg  daily. You can now use [employer] Grant for 2024 to cover copay for Praluent and ezetimibe  Hypertension / high blood pressure: last office blood pressure was at goal of < 130/80  Continue amlodipine 10mg  daily and losartan 50mg  daily.  Medication management:  Reviewed and updated medication list Reviewed refill history and adherence Updated Rxs for allopurinol, losartan and [facility]   As always if you have any questions or concerns especially regarding medications, please feel free to contact me either at the phone number below or with a MyChart message.   Keep up the  good work! I will check back in with you April 8th, 2024 at 11:15am  Cherre Robins, PharmD Clinical Pharmacist [place] [phone] (direct line)  [phone] (main office number)   The patient verbalized understanding of instructions, educational materials, and care plan provided today and agreed to receive a mailed copy of patient instructions, educational materials, and care plan.

## 2022-01-15 NOTE — Progress Notes (Addendum)
Pharmacy Note  01/15/2022 Name: Hayley Case MRN: [id number] DOB: 02-Dec-1950  Subjective: Hayley Case is a 72 y.o. year old female who is a primary care patient of Marrian Salvage, [place]. Clinical Pharmacist Practitioner referral was placed to assist with medication and hyperlipidemia management.    Engaged with patient by telephone for follow up visit today.  Summary: Diabetes: Last A1c had improved to 7.1% but insulin dose for Toujeo and Novolog decreased due to hypoglycemia during night. Patient is using Continuous Glucose Monitor - Freestyle Libre 2. Report that since Dr Kelton Pillar adjusted her insulin she is receiving fewer low blood glucose alerts. She does report lots of alerts for lost signal.  Seen for yearly exam 09/18/2021. Hyperlipidemia: LDL goal per cardiology office < 55. She was unable to tolerate atorvastatin or rosuvastatin. Atorvastatin caused increased LFTs requiring hospitalization. Started Praluent 150mg  every 14 days 06/17/2021. Continues to take ezetimibe 10mg  daily. She reports that copay for Praluent is costly ($75 per month) Has 3 other medications that are also $75 per month.  Hypertension: last office blood pressure was at goal of < 130/80 (CKD). Patient has declined to check blood pressure at home.  Medication management: updated med list. Patient reports she is still [facility] [place] [facility] [place] and some in [place]. She prefers to get in [facility] if possible.   Objective: Review of patient status, including review of consultants reports, laboratory and other test data, was performed as part of comprehensive.  Lab Results  Component Value Date   CREATININE 1.98 (H) 09/18/2020   CREATININE 2.17 (H) 05/06/2019   CREATININE 1.86 (H) 07/28/2018    Lab Results  Component Value Date   HGBA1C 7.1 (A) 12/20/2021       Component Value Date/Time   CHOL 114 05/06/2019 1222   TRIG 145 05/06/2019 1222   HDL 35 (L) 05/06/2019 1222    CHOLHDL 3.3 05/06/2019 1222   LDLCALC 54 05/06/2019 1222     Clinical ASCVD: Yes  The ASCVD Risk score (Arnett DK, et al., 2019) failed to calculate for the following reasons:   The valid total cholesterol range is 130 to 320 mg/dL    BP Readings from Last 3 Encounters:  12/20/21 122/80  07/02/21 132/84  06/21/21 136/78     Allergies  Allergen Reactions   Lisinopril Cough   Atorvastatin Other (See Comments)    Liver function test elevation Other reaction(s): liver issues   Penicillins Hives and Itching    Did it involve swelling of the face/tongue/throat, SOB, or low BP? No Did it involve sudden or severe rash/hives, skin peeling, or any reaction on the inside of your mouth or nose? Yes Did you need to seek medical attention at a hospital or doctor's office? Yes When did it last happen?      2012 If all above answers are "NO", may proceed with cephalosporin use.   Rosuvastatin Other (See Comments)    Myalgias / leg cramps   Sulfamethoxazole-Trimethoprim Hives    Other reaction(s): Unknown   Penicillin G     Other reaction(s): Unknown    Medications Reviewed Today     Reviewed by Jefferson Fuel, CMA (Certified Medical Assistant) on 12/20/21 at [phone]  Med List Status: <None>   Medication Order Taking? Sig Documenting Provider Last Dose Status Informant  ACETAMINOPHEN PO [id number] Yes Take 650 mg by mouth every 6 (six) hours as needed for headache or moderate pain. [provider] Taking  Active Self  albuterol (VENTOLIN HFA) 108 (90 Base) MCG/ACT inhaler [id number] Yes Inhale 2 puffs into the lungs every 6 (six) hours as needed for wheezing or shortness of breath. Geralynn Rile, MD Taking Active   Alirocumab (PRALUENT) 150 MG/ML Darden Palmer [id number] Yes Inject 150 mg into the skin every 14 (fourteen) days. Geralynn Rile, MD Taking Active   allopurinol (ZYLOPRIM) 100 MG tablet [id number] Yes Take 100 mg by mouth daily. [provider] Taking  Active   amLODipine (NORVASC) 10 MG tablet [id number] Yes TAKE 1 TABLET BY MOUTH DAILY Mosie Lukes, MD Taking Active   Biotin 5000 MCG CAPS [id number] Yes Take 1 capsule by mouth daily. [provider] Taking Active Self  Cholecalciferol (VITAMIN D) 50 MCG (2000 UT) CAPS [id number] Yes Take 4,000 Units by mouth daily. [provider] Taking Active Self  Continuous Blood Gluc Sensor (FREESTYLE LIBRE 2 SENSOR) MISC [id number] Yes Inject 1 Device into the skin as directed. Use as directed every 14 days. E11.21 Shamleffer, Melanie Crazier, MD Taking Active   Docusate Sodium (COLACE PO) [id number] Yes Take 1 capsule by mouth daily. [provider] Taking Active   ezetimibe (ZETIA) 10 MG tablet [id number] Yes Take 1 tablet (10 mg total) by mouth daily. Mosie Lukes, MD Taking Active   fluocinonide (LIDEX) 0.05 % external solution [id number] Yes Apply topically in the morning and at bedtime. [provider] Taking Active            Med Note Antony Contras, Tomer Chalmers B   Thu Jun 27, 2021 10:43 AM) Using as needed  insulin aspart (NOVOLOG FLEXPEN) 100 UNIT/ML FlexPen [id number] Yes Max daily 70 units Shamleffer, Melanie Crazier, MD Taking Active   insulin glargine, 2 Unit Dial, (TOUJEO MAX SOLOSTAR) 300 UNIT/ML Solostar Pen [id number] Yes Inject 24 Units into the skin daily at 6 (six) AM. Shamleffer, Melanie Crazier, MD Taking Active   Insulin Pen Needle (PEN NEEDLES) 32G X 4 MM MISC [id number] Yes 1 Device by Does not apply route in the morning, at noon, in the evening, and at bedtime. Marrian Salvage, FNP Taking Active   loratadine (CLARITIN) 10 MG tablet [id number] Yes Take 10 mg by mouth daily as needed (seasonal allergies). [provider] Taking Active Self  losartan (COZAAR) 50 MG tablet [id number] Yes Take 1 tablet (50 mg total) by mouth daily. Marrian Salvage, FNP Taking Active   Polyethyl Glycol-Propyl Glycol [facility] FREE OP) [id number] Yes Apply 1 drop to  eye as needed. [provider] Taking Active   Semaglutide (RYBELSUS) 14 MG TABS [id number] Yes Take 1 tablet (14 mg total) by mouth daily. Shamleffer, Melanie Crazier, MD Taking Active   vitamin B-12 (CYANOCOBALAMIN) 1000 MCG tablet [id number] Yes Take 1,000 mcg by mouth daily. [provider] Taking Active             Patient Active Problem List   Diagnosis Date Noted   Type 2 diabetes mellitus with stage 4 chronic kidney disease, with long-term current use of insulin ([place]) 07/02/2021   Arthritis of hip 04/18/2021   Background diabetic retinopathy ([place]) 04/18/2021   Chronic kidney disease, stage 3b ([place]) 04/18/2021   Dyslipidemia 04/18/2021   Family history of malignant neoplasm of digestive organs 04/18/2021   Gouty arthritis 04/18/2021   Hearing loss 04/18/2021   Hyperglycemia due to type 2 diabetes mellitus ([place]) 04/18/2021   Long term (current) use of insulin ([place]) 04/18/2021   Obstructive sleep apnea (adult) (pediatric) 04/18/2021   Peripheral  arterial disease ([place]) 04/18/2021   Personal history of colonic polyps 04/18/2021   Flank pain 10/14/2019   Type 2 diabetes mellitus with diabetic polyneuropathy, with long-term current use of insulin ([place]) 12/01/2018   Type 2 diabetes mellitus with retinopathy, with long-term current use of insulin ([place]) 12/01/2018   Type 2 diabetes mellitus with stage 3b chronic kidney disease, with long-term current use of insulin ([place]) 11/30/2018   Non-intractable vomiting    Acute hepatitis 07/13/2018   Acute on chronic renal insufficiency 07/12/2018   Diabetes mellitus ([place]) 12/08/2016   Essential hypertension 12/08/2016   Hyperlipidemia associated with type 2 diabetes mellitus ([place]) 12/08/2016     Medication Assistance:  Application for [employer] Hyperlipidemia grant approved today ($2500 - from 12/16/2021 thru 12/16/2022). Mailed patient print out of approval letter and pharmacy card info.    Assessment / Plan: Diabetes:  At goal of < 7.5% Continue current insulin regimen.  Continue to follow up with Dr Kelton Pillar.  Discussed that she would possibly change to [place] 3 sensor and might get fewer disconnect alerts. She can also turn off the disconnect alert if needed. Reviewed hypoglycemia treatment Hyperlipidemia: LDL goal per cardiology office < 55.  Due to recheck lipids. Made appt with PCP for 02/07/2021.  Continue Praluent 150mg  every 14 days and ezetimibe 10mg  daily. See above regarding [employer] for 2024.   Hypertension: last office blood pressure was at goal of < 130/80 (CKD).  Continue amlodipine 10mg  daily and losartan 50mg  daily. Medication management:  Reviewed and updated medication list Reviewed refill history and adherence Updated Rxs for allopurinol, losartan and [facility].   Meds ordered this encounter  Medications   losartan (COZAAR) 50 MG tablet    Sig: Take 1 tablet (50 mg total) by mouth daily.    Dispense:  90 tablet    Refill:  0    Please cancel any previous prescriptions. Fill / Hold based on patient's refill schedule.   ezetimibe (ZETIA) 10 MG tablet    Sig: Take 1 tablet (10 mg total) by mouth daily.    Dispense:  90 tablet    Refill:  0    Please cancel any previous prescriptions. Fill / Hold based on patient's refill schedule.   allopurinol (ZYLOPRIM) 100 MG tablet    Sig: Take 1 tablet (100 mg total) by mouth daily.    Dispense:  90 tablet    Refill:  0    Please cancel any previous prescriptions. Fill / Hold based on patient's refill schedule.    Follow Up:  Telephone follow up appointment with care management team member scheduled for:  3 to 4 months   Cherre Robins, PharmD Clinical Pharmacist [place] [phone]  I have personally reviewed this encounter including the documentation in this note and have collaborated with the care management provider regarding care management and care  coordination activities to include development and update of the comprehensive care plan. I am certifying that I agree with the content of this note and encounter as supervising physician.

## 2022-01-28 ENCOUNTER — Ambulatory Visit (INDEPENDENT_AMBULATORY_CARE_PROVIDER_SITE_OTHER): Payer: Medicare HMO | Admitting: Psychology

## 2022-01-28 DIAGNOSIS — F4323 Adjustment disorder with mixed anxiety and depressed mood: Secondary | ICD-10-CM | POA: Diagnosis not present

## 2022-01-28 NOTE — Progress Notes (Signed)
[facility] Counselor/Therapist Progress Note  Patient ID: KRISTYL ATHENS, MRN: [id number],    Date: 01/28/2022  Time Spent: 2:00pm-2:55pm    55 minutes   Treatment Type: Individual Therapy  Reported Symptoms: stress  Mental Status Exam: Appearance:  Casual     Behavior: Appropriate  Motor: Normal  Speech/Language:  Normal Rate  Affect: Appropriate  Mood: normal  Thought process: normal  Thought content:   WNL  Sensory/Perceptual disturbances:   WNL  Orientation: oriented to person, place, time/date, and situation  Attention: Good  Concentration: Good  Memory: WNL  Fund of knowledge:  Good  Insight:   Good  Judgment:  Good  Impulse Control: Good   Risk Assessment: Danger to Self:  No Self-injurious Behavior: No Danger to Others: No Duty to Warn:no Physical Aggression / Violence:No  Access to Firearms a concern: No  Gang Involvement:No   Subjective: Pt present for face-to-face individual therapy via video Webex.  Pt consents to telehealth video session due to COVID 19 pandemic. Location of pt: home Location of therapist: home office.   Pt talked about her son who is on dialysis.   She often worries about him.   At one point he fainted during dialysis.  Addressed pt's worries.   Pt talked about her health.  She is in stage 4 renal disease.  At this point the doctors are just monitoring her condition.   Pt is stable at this point.  Pt talked about her sister and family having covid.   Addressed pt's concerns about her family.   Pt has trouble getting transportation to doctors appointments at times.  Helped her problem solve.   Pt talked about missing the Sunday dinners she had with family when she lived in [place].   Helped pt process her feelings.   Encouraged pt to increase self care.  Provided supportive therapy.  Interventions: Cognitive Behavioral Therapy and Insight-Oriented  Diagnosis: F43.23   Plan of Care: Recommend ongoing therapy.  Pt  participated in setting treatment goals.   Pt wants to improve coping skills.  Plan to meet monthly.  Treatment Plan  (treatment plan target date:  11/08/2022) Client Abilities/Strengths  Pt is bright, engaging, and motivated for therapy.  Client Treatment Preferences  Individual therapy.  Client Statement of Needs  Improve copings skills and understand herself better. Improve self esteem.  Symptoms  Depressed or irritable mood. Excessive and/or unrealistic worry that is difficult to control occurring more days than not for at least 6 months about a number of events or activities. Hypervigilance (e.g., feeling constantly on edge, experiencing concentration difficulties, having trouble falling or staying asleep, exhibiting a general state of irritability). Low self-esteem. Problems Addressed  Unipolar Depression, Anxiety Goals 1. Alleviate depressive symptoms and return to previous level of effective functioning. 2. Appropriately grieve the loss in order to normalize mood and to return to previously adaptive level of functioning. Objective Learn and implement behavioral strategies to overcome depression. Target Date: 2022-11-08 Frequency: Monthly  Progress: 40 Modality: individual  Related Interventions Assist the client in developing skills that increase the likelihood of deriving pleasure from behavioral activation (e.g., assertiveness skills, developing an exercise plan, less internal/more external focus, increased social involvement); reinforce success. Engage the client in "behavioral activation," increasing his/her activity level and contact with sources of reward, while identifying processes that inhibit activation. use behavioral techniques such as instruction, rehearsal, role-playing, role reversal, as needed, to facilitate activity in the client's daily life; reinforce success. 3. Develop healthy interpersonal  relationships that lead to the alleviation and help prevent the relapse of  depression. 4. Develop healthy thinking patterns and beliefs about self, others, and the world that lead to the alleviation and help prevent the relapse of depression. 5. Enhance ability to effectively cope with the full variety of life's worries and anxieties. 6. Learn and implement coping skills that result in a reduction of anxiety and worry, and improved daily functioning. Objective Learn and implement problem-solving strategies for realistically addressing worries. Target Date: 2022-11-08 Frequency: Monthly  Progress: 40 Modality: individual  Related Interventions Assign the client a homework exercise in which he/she problem-solves a current problem (see Mastery of Your Anxiety and Worry: Workbook by Adora Fridge and Eliot Ford or Generalized Anxiety Disorder by Eather Colas, and Eliot Ford); review, reinforce success, and provide corrective feedback toward improvement. Teach the client problem-solving strategies involving specifically defining a problem, generating options for addressing it, evaluating the pros and cons of each option, selecting and implementing an optional action, and reevaluating and refining the action. Objective Learn and implement calming skills to reduce overall anxiety and manage anxiety symptoms. Target Date: 2022-11-08 Frequency: Monthly  Progress: 40 Modality: individual  Related Interventions Assign the client to read about progressive muscle relaxation and other calming strategies in relevant books or treatment manuals (e.g., Progressive Relaxation Training by Leroy Kennedy; Mastery of Your Anxiety and Worry: Workbook by Beckie Busing). Assign the client homework each session in which he/she practices relaxation exercises daily, gradually applying them progressively from non-anxiety-provoking to anxiety-provoking situations; review and reinforce success while providing corrective feedback toward improvement. Teach the client calming/relaxation skills (e.g.,  applied relaxation, progressive muscle relaxation, cue controlled relaxation; mindful breathing; biofeedback) and how to discriminate better between relaxation and tension; teach the client how to apply these skills to his/her daily life. 7. Recognize, accept, and cope with feelings of depression. 8. Reduce overall frequency, intensity, and duration of the anxiety so that daily functioning is not impaired. 9. Resolve the core conflict that is the source of anxiety. 10. Stabilize anxiety level while increasing ability to function on a daily basis. Diagnosis F43.23 Conditions For Discharge Achievement of treatment goals and objectives   Clint Bolder, LCSW

## 2022-01-29 ENCOUNTER — Other Ambulatory Visit (HOSPITAL_COMMUNITY): Payer: Self-pay

## 2022-01-29 ENCOUNTER — Telehealth: Payer: Self-pay

## 2022-01-29 NOTE — Telephone Encounter (Signed)
Pharmacy Patient Advocate Encounter   Received notification from [facility] that prior authorization for Repatha 140mg /ml is required/requested.    PA submitted on 01/29/22 to (ins) [employer] via CoverMyMeds Key [id number] Status is pending

## 2022-01-29 NOTE — Telephone Encounter (Signed)
Pharmacy Patient Advocate Encounter  Prior Authorization for Repatha 140mg /ml has been approved.    key# [id number]  Effective dates: 1.1.24 through 12.31.24  Please note that RX Praluent is no longer on the patients formulary.

## 2022-02-04 MED ORDER — REPATHA SURECLICK 140 MG/ML ~~LOC~~ SOAJ: 140.0000 mg | SUBCUTANEOUS | 3 refills | Status: DC

## 2022-02-07 ENCOUNTER — Ambulatory Visit: Payer: Medicare HMO | Admitting: Family

## 2022-02-14 ENCOUNTER — Ambulatory Visit (INDEPENDENT_AMBULATORY_CARE_PROVIDER_SITE_OTHER): Payer: Medicare HMO | Admitting: Family

## 2022-02-14 ENCOUNTER — Encounter: Payer: Self-pay | Admitting: Family

## 2022-02-14 VITALS — BP 126/78 | HR 100 | Resp 18 | Ht 64.0 in | Wt 190.2 lb

## 2022-02-14 DIAGNOSIS — I10 Essential (primary) hypertension: Secondary | ICD-10-CM

## 2022-02-14 DIAGNOSIS — H9193 Unspecified hearing loss, bilateral: Secondary | ICD-10-CM | POA: Diagnosis not present

## 2022-02-14 DIAGNOSIS — Z23 Encounter for immunization: Secondary | ICD-10-CM | POA: Diagnosis not present

## 2022-02-14 DIAGNOSIS — E1142 Type 2 diabetes mellitus with diabetic polyneuropathy: Secondary | ICD-10-CM

## 2022-02-14 DIAGNOSIS — E785 Hyperlipidemia, unspecified: Secondary | ICD-10-CM | POA: Diagnosis not present

## 2022-02-14 DIAGNOSIS — H6121 Impacted cerumen, right ear: Secondary | ICD-10-CM | POA: Diagnosis not present

## 2022-02-14 DIAGNOSIS — Z794 Long term (current) use of insulin: Secondary | ICD-10-CM

## 2022-02-14 NOTE — Progress Notes (Signed)
Hayley Case is a 72 y.o. female with the following history as recorded in EpicCare:  Patient Active Problem List   Diagnosis Date Noted   Type 2 diabetes mellitus with stage 4 chronic kidney disease, with long-term current use of insulin ([place]) 07/02/2021   Arthritis of hip 04/18/2021   Background diabetic retinopathy ([place]) 04/18/2021   Chronic kidney disease, stage 3b ([place]) 04/18/2021   Dyslipidemia 04/18/2021   Family history of malignant neoplasm of digestive organs 04/18/2021   Gouty arthritis 04/18/2021   Hearing loss 04/18/2021   Hyperglycemia due to type 2 diabetes mellitus ([place]) 04/18/2021   Long term (current) use of insulin ([place]) 04/18/2021   Obstructive sleep apnea (adult) (pediatric) 04/18/2021   Peripheral arterial disease ([place]) 04/18/2021   Personal history of colonic polyps 04/18/2021   Flank pain 10/14/2019   Type 2 diabetes mellitus with diabetic polyneuropathy, with long-term current use of insulin ([place]) 12/01/2018   Type 2 diabetes mellitus with retinopathy, with long-term current use of insulin ([place]) 12/01/2018   Type 2 diabetes mellitus with stage 3b chronic kidney disease, with long-term current use of insulin ([place]) 11/30/2018   Non-intractable vomiting    Acute hepatitis 07/13/2018   Acute on chronic renal insufficiency 07/12/2018   Diabetes mellitus ([place]) 12/08/2016   Essential hypertension 12/08/2016   Hyperlipidemia associated with type 2 diabetes mellitus ([place]) 12/08/2016    Current Outpatient Medications  Medication Sig Dispense Refill   ACETAMINOPHEN PO Take 650 mg by mouth every 6 (six) hours as needed for headache or moderate pain.     albuterol (VENTOLIN HFA) 108 (90 Base) MCG/ACT inhaler Inhale 2 puffs into the lungs every 6 (six) hours as needed for wheezing or shortness of breath. 1 g 3   allopurinol (ZYLOPRIM) 100 MG tablet Take 1 tablet (100 mg total) by mouth daily. 90 tablet 0   amLODipine (NORVASC) 10 MG tablet TAKE 1 TABLET BY MOUTH DAILY  90 tablet 1   Biotin 5000 MCG CAPS Take 1 capsule by mouth daily.     Cholecalciferol (VITAMIN D) 50 MCG (2000 UT) CAPS Take 4,000 Units by mouth daily.     Continuous Blood Gluc Sensor (FREESTYLE LIBRE 2 SENSOR) MISC Inject 1 Device into the skin as directed. Use as directed every 14 days. E11.21 12 each 3   Docusate Sodium (COLACE PO) Take 1 capsule by mouth daily.     Evolocumab (REPATHA SURECLICK) 850 MG/ML SOAJ Inject 140 mg into the skin every 14 (fourteen) days. 6 mL 3   ezetimibe (ZETIA) 10 MG tablet Take 1 tablet (10 mg total) by mouth daily. 90 tablet 0   fluocinonide (LIDEX) 0.05 % external solution Apply topically in the morning and at bedtime.     insulin aspart (NOVOLOG FLEXPEN) 100 UNIT/ML FlexPen Max daily 70 units 60 mL 3   insulin glargine, 2 Unit Dial, (TOUJEO MAX SOLOSTAR) 300 UNIT/ML Solostar Pen Inject 22 Units into the skin daily at 6 (six) AM. 30 mL 3   Insulin Pen Needle (PEN NEEDLES) 32G X 4 MM MISC 1 Device by Does not apply route in the morning, at noon, in the evening, and at bedtime. 400 each 3   loratadine (CLARITIN) 10 MG tablet Take 10 mg by mouth daily as needed (seasonal allergies).     losartan (COZAAR) 50 MG tablet Take 1 tablet (50 mg total) by mouth daily. 90 tablet 0   Polyethyl Glycol-Propyl Glycol (SYSTANE FREE OP) Apply 1 drop to eye as needed.  Semaglutide (RYBELSUS) 14 MG TABS Take 1 tablet (14 mg total) by mouth daily. 90 tablet 3   vitamin B-12 (CYANOCOBALAMIN) 1000 MCG tablet Take 1,000 mcg by mouth daily.     No current facility-administered medications for this visit.    Allergies: Lisinopril, Atorvastatin, Penicillins, Rosuvastatin, Sulfamethoxazole-trimethoprim, and Penicillin g  Past Medical History:  Diagnosis Date   CKD (chronic kidney disease)    Diabetes mellitus without complication ([place])    Hyperlipidemia    Hypertension     Past Surgical History:  Procedure Laterality Date   BREAST BIOPSY     BREAST EXCISIONAL BIOPSY Left      Family History  Problem Relation Age of Onset   Skin cancer Mother    Heart attack Father    Colon cancer Sister     Social History   Tobacco Use   Smoking status: Never   Smokeless tobacco: Never  Substance Use Topics   Alcohol use: No    Subjective:   Requesting referral to new ENT- her former ENT is now out of network; does wear hearing aids/ feels like hearing is down; Continuing to work with cardiologist for management of cholesterol- has not started Repatha yet;  Scheduled to see her endocrinologist later this spring- last Hgba1c was down to 7.1;     Objective:  Vitals:   02/14/22 0927  BP: 126/78  Pulse: 100  Resp: 18  SpO2: 99%  Weight: 190 lb 3.2 oz (86.3 kg)  Height: 5\' 4"  (1.626 m)    General: Well developed, well nourished, in no acute distress  Skin : Warm and dry.  Head: Normocephalic and atraumatic  Eyes: Sclera and conjunctiva clear; pupils round and reactive to light; extraocular movements intact  Ears: External normal; after lavage right ear, canals clear; tympanic membranes normal  Oropharynx: Pink, supple. No suspicious lesions  Neck: Supple without thyromegaly, adenopathy  Lungs: Respirations unlabored; clear to auscultation bilaterally without wheeze, rales, rhonchi  CVS exam: normal rate and regular rhythm.  Neurologic: Alert and oriented; speech intact; face symmetrical; moves all extremities well; CNII-XII intact without focal deficit   Assessment:  1. Bilateral hearing loss, unspecified hearing loss type   2. Need for pneumococcal 20-valent conjugate vaccination   3. Impacted cerumen of right ear   4. Essential hypertension   5. Dyslipidemia   6. Type 2 diabetes mellitus with diabetic polyneuropathy, with long-term current use of insulin ([place])     Plan:  Refer to new ENT to establish care- her former ENT is now out of network; does have hearing aids managed by [facility]; Prevnar 20 updated today; Ear lavage completed with no  difficulty; Stable; continue same medications; She will follow up with cardiology who is managing- has recently been switched to [place]; has not started yet and understands need to follow up there; Scheduled to see her endocrinologist in May 2024;   Time spent with patient 30 minutes reviewing medication/ medication management; also discussed options for social outlets for her to consider including [employer] near her home.   No follow-ups on file.  Orders Placed This Encounter  Procedures   Pneumococcal conjugate vaccine 20-valent (Prevnar 20)   Ambulatory referral to ENT    Referral Priority:   Routine    Referral Type:   Consultation    Referral Reason:   Specialty Services Required    Requested Specialty:   Otolaryngology    Number of Visits Requested:   1    Requested Prescriptions  No prescriptions requested or ordered in this encounter

## 2022-02-27 ENCOUNTER — Ambulatory Visit (INDEPENDENT_AMBULATORY_CARE_PROVIDER_SITE_OTHER): Payer: Medicare HMO | Admitting: Psychology

## 2022-02-27 DIAGNOSIS — F4323 Adjustment disorder with mixed anxiety and depressed mood: Secondary | ICD-10-CM

## 2022-02-27 NOTE — Progress Notes (Signed)
[place] Counselor/Therapist Progress Note  Patient ID: Hayley Case, MRN: [id number],    Date: 02/27/2022  Time Spent: 10:00am-10:55am     55 minutes   Treatment Type: Individual Therapy  Reported Symptoms: stress  Mental Status Exam: Appearance:  Casual     Behavior: Appropriate  Motor: Normal  Speech/Language:  Normal Rate  Affect: Appropriate  Mood: normal  Thought process: normal  Thought content:   WNL  Sensory/Perceptual disturbances:   WNL  Orientation: oriented to person, place, time/date, and situation  Attention: Good  Concentration: Good  Memory: WNL  Fund of knowledge:  Good  Insight:   Good  Judgment:  Good  Impulse Control: Good   Risk Assessment: Danger to Self:  No Self-injurious Behavior: No Danger to Others: No Duty to Warn:no Physical Aggression / Violence:No  Access to Firearms a concern: No  Gang Involvement:No   Subjective: Pt present for face-to-face individual therapy via video Webex.  Pt consents to telehealth video session due to COVID 19 pandemic. Location of pt: home Location of therapist: home office.   Pt talked about her son.  She worries about his health issues.  Addressed pt's concerns.   Helped pt process her feelings.   Pt talked about her home.  She and her son are getting settled in their new house and pt feels like it was a good decision to move there.   Pt talked about her niece who is having trouble with anxiety.  Pt encouraged her to seek counseling.   Pt's brother is having health issues that pt is worried about.  Pt states she tends to over think things.  She worries about saying the wrong thing.  Pt needs social connection and does not get as much as she would like.  Addressed how pt can increase social connection.   Encouraged pt to increase self care.  Provided supportive therapy.  Interventions: Cognitive Behavioral Therapy and Insight-Oriented  Diagnosis: F43.23   Plan of Care: Recommend  ongoing therapy.  Pt participated in setting treatment goals.   Pt wants to improve coping skills.  Plan to meet monthly.  Treatment Plan  (treatment plan target date:  11/08/2022) Client Abilities/Strengths  Pt is bright, engaging, and motivated for therapy.  Client Treatment Preferences  Individual therapy.  Client Statement of Needs  Improve copings skills and understand herself better. Improve self esteem.  Symptoms  Depressed or irritable mood. Excessive and/or unrealistic worry that is difficult to control occurring more days than not for at least 6 months about a number of events or activities. Hypervigilance (e.g., feeling constantly on edge, experiencing concentration difficulties, having trouble falling or staying asleep, exhibiting a general state of irritability). Low self-esteem. Problems Addressed  Unipolar Depression, Anxiety Goals 1. Alleviate depressive symptoms and return to previous level of effective functioning. 2. Appropriately grieve the loss in order to normalize mood and to return to previously adaptive level of functioning. Objective Learn and implement behavioral strategies to overcome depression. Target Date: 2022-11-08 Frequency: Monthly  Progress: 40 Modality: individual  Related Interventions Assist the client in developing skills that increase the likelihood of deriving pleasure from behavioral activation (e.g., assertiveness skills, developing an exercise plan, less internal/more external focus, increased social involvement); reinforce success. Engage the client in "behavioral activation," increasing his/her activity level and contact with sources of reward, while identifying processes that inhibit activation. use behavioral techniques such as instruction, rehearsal, role-playing, role reversal, as needed, to facilitate activity in the client's daily life; reinforce  success. 3. Develop healthy interpersonal relationships that lead to the alleviation and help  prevent the relapse of depression. 4. Develop healthy thinking patterns and beliefs about self, others, and the world that lead to the alleviation and help prevent the relapse of depression. 5. Enhance ability to effectively cope with the full variety of life's worries and anxieties. 6. Learn and implement coping skills that result in a reduction of anxiety and worry, and improved daily functioning. Objective Learn and implement problem-solving strategies for realistically addressing worries. Target Date: 2022-11-08 Frequency: Monthly  Progress: 40 Modality: individual  Related Interventions Assign the client a homework exercise in which he/she problem-solves a current problem (see Mastery of Your Anxiety and Worry: Workbook by Adora Fridge and Eliot Ford or Generalized Anxiety Disorder by Eather Colas, and Eliot Ford); review, reinforce success, and provide corrective feedback toward improvement. Teach the client problem-solving strategies involving specifically defining a problem, generating options for addressing it, evaluating the pros and cons of each option, selecting and implementing an optional action, and reevaluating and refining the action. Objective Learn and implement calming skills to reduce overall anxiety and manage anxiety symptoms. Target Date: 2022-11-08 Frequency: Monthly  Progress: 40 Modality: individual  Related Interventions Assign the client to read about progressive muscle relaxation and other calming strategies in relevant books or treatment manuals (e.g., Progressive Relaxation Training by Leroy Kennedy; Mastery of Your Anxiety and Worry: Workbook by Beckie Busing). Assign the client homework each session in which he/she practices relaxation exercises daily, gradually applying them progressively from non-anxiety-provoking to anxiety-provoking situations; review and reinforce success while providing corrective feedback toward improvement. Teach the client  calming/relaxation skills (e.g., applied relaxation, progressive muscle relaxation, cue controlled relaxation; mindful breathing; biofeedback) and how to discriminate better between relaxation and tension; teach the client how to apply these skills to his/her daily life. 7. Recognize, accept, and cope with feelings of depression. 8. Reduce overall frequency, intensity, and duration of the anxiety so that daily functioning is not impaired. 9. Resolve the core conflict that is the source of anxiety. 10. Stabilize anxiety level while increasing ability to function on a daily basis. Diagnosis F43.23 Conditions For Discharge Achievement of treatment goals and objectives   Clint Bolder, LCSW

## 2022-03-27 ENCOUNTER — Encounter: Payer: Self-pay | Admitting: Family

## 2022-03-27 ENCOUNTER — Ambulatory Visit (INDEPENDENT_AMBULATORY_CARE_PROVIDER_SITE_OTHER): Payer: Medicare HMO | Admitting: Psychology

## 2022-03-27 ENCOUNTER — Ambulatory Visit (HOSPITAL_BASED_OUTPATIENT_CLINIC_OR_DEPARTMENT_OTHER)
Admission: RE | Admit: 2022-03-27 | Discharge: 2022-03-27 | Disposition: A | Discharge status: Home / Self Care | Payer: Medicare HMO | Source: Ambulatory Visit | Attending: Family | Admitting: Family

## 2022-03-27 ENCOUNTER — Ambulatory Visit (INDEPENDENT_AMBULATORY_CARE_PROVIDER_SITE_OTHER): Payer: Medicare HMO | Admitting: Family

## 2022-03-27 VITALS — BP 136/78 | HR 92 | Resp 18 | Ht 64.0 in | Wt 189.0 lb

## 2022-03-27 DIAGNOSIS — G8929 Other chronic pain: Secondary | ICD-10-CM | POA: Insufficient documentation

## 2022-03-27 DIAGNOSIS — F4323 Adjustment disorder with mixed anxiety and depressed mood: Secondary | ICD-10-CM

## 2022-03-27 DIAGNOSIS — M25561 Pain in right knee: Secondary | ICD-10-CM | POA: Diagnosis present

## 2022-03-27 NOTE — Progress Notes (Signed)
[place] Counselor/Therapist Progress Note  Patient ID: Hayley Case, MRN: [id number],    Date: 03/27/2022  Time Spent: 11:00am-11:55am     55 minutes   Treatment Type: Individual Therapy  Reported Symptoms: stress, sadness  Mental Status Exam: Appearance:  Casual     Behavior: Appropriate  Motor: Normal  Speech/Language:  Normal Rate  Affect: Appropriate  Mood: normal  Thought process: normal  Thought content:   WNL  Sensory/Perceptual disturbances:   WNL  Orientation: oriented to person, place, time/date, and situation  Attention: Good  Concentration: Good  Memory: WNL  Fund of knowledge:  Good  Insight:   Good  Judgment:  Good  Impulse Control: Good   Risk Assessment: Danger to Self:  No Self-injurious Behavior: No Danger to Others: No Duty to Warn:no Physical Aggression / Violence:No  Access to Firearms a concern: No  Gang Involvement:No   Subjective: Pt present for face-to-face individual therapy via video Webex.  Pt consents to telehealth video session due to COVID 19 pandemic. Location of pt: home Location of therapist: home office.   Pt talked about her oldest brother being near death.  There was a lot of family drama around communication while pt's brother was sick.   Addressed the family dynamics and helped pt process her feelings.  Pt states the funeral is already scheduled for the 23rd of this month.  Pt will travel to attend the funeral.   Pt talked about her health.  She is having knee pain and it is affecting her driving.  Encouraged pt to see a doctor about her knee.   Pt talked about concerns about her son.   He is on dialysis and has been having additional health issues.  Addressed pt's worries.  Encouraged pt to increase self care.  Provided supportive therapy.  Interventions: Cognitive Behavioral Therapy and Insight-Oriented  Diagnosis: F43.23   Plan of Care: Recommend ongoing therapy.  Pt participated in setting treatment  goals.   Pt wants to improve coping skills.  Plan to meet monthly.  Treatment Plan  (treatment plan target date:  11/08/2022) Client Abilities/Strengths  Pt is bright, engaging, and motivated for therapy.  Client Treatment Preferences  Individual therapy.  Client Statement of Needs  Improve copings skills and understand herself better. Improve self esteem.  Symptoms  Depressed or irritable mood. Excessive and/or unrealistic worry that is difficult to control occurring more days than not for at least 6 months about a number of events or activities. Hypervigilance (e.g., feeling constantly on edge, experiencing concentration difficulties, having trouble falling or staying asleep, exhibiting a general state of irritability). Low self-esteem. Problems Addressed  Unipolar Depression, Anxiety Goals 1. Alleviate depressive symptoms and return to previous level of effective functioning. 2. Appropriately grieve the loss in order to normalize mood and to return to previously adaptive level of functioning. Objective Learn and implement behavioral strategies to overcome depression. Target Date: 2022-11-08 Frequency: Monthly  Progress: 40 Modality: individual  Related Interventions Assist the client in developing skills that increase the likelihood of deriving pleasure from behavioral activation (e.g., assertiveness skills, developing an exercise plan, less internal/more external focus, increased social involvement); reinforce success. Engage the client in "behavioral activation," increasing his/her activity level and contact with sources of reward, while identifying processes that inhibit activation. use behavioral techniques such as instruction, rehearsal, role-playing, role reversal, as needed, to facilitate activity in the client's daily life; reinforce success. 3. Develop healthy interpersonal relationships that lead to the alleviation and help prevent  the relapse of depression. 4. Develop healthy  thinking patterns and beliefs about self, others, and the world that lead to the alleviation and help prevent the relapse of depression. 5. Enhance ability to effectively cope with the full variety of life's worries and anxieties. 6. Learn and implement coping skills that result in a reduction of anxiety and worry, and improved daily functioning. Objective Learn and implement problem-solving strategies for realistically addressing worries. Target Date: 2022-11-08 Frequency: Monthly  Progress: 40 Modality: individual  Related Interventions Assign the client a homework exercise in which he/she problem-solves a current problem (see Mastery of Your Anxiety and Worry: Workbook by Adora Fridge and Eliot Ford or Generalized Anxiety Disorder by Eather Colas, and Eliot Ford); review, reinforce success, and provide corrective feedback toward improvement. Teach the client problem-solving strategies involving specifically defining a problem, generating options for addressing it, evaluating the pros and cons of each option, selecting and implementing an optional action, and reevaluating and refining the action. Objective Learn and implement calming skills to reduce overall anxiety and manage anxiety symptoms. Target Date: 2022-11-08 Frequency: Monthly  Progress: 40 Modality: individual  Related Interventions Assign the client to read about progressive muscle relaxation and other calming strategies in relevant books or treatment manuals (e.g., Progressive Relaxation Training by Leroy Kennedy; Mastery of Your Anxiety and Worry: Workbook by Beckie Busing). Assign the client homework each session in which he/she practices relaxation exercises daily, gradually applying them progressively from non-anxiety-provoking to anxiety-provoking situations; review and reinforce success while providing corrective feedback toward improvement. Teach the client calming/relaxation skills (e.g., applied relaxation, progressive  muscle relaxation, cue controlled relaxation; mindful breathing; biofeedback) and how to discriminate better between relaxation and tension; teach the client how to apply these skills to his/her daily life. 7. Recognize, accept, and cope with feelings of depression. 8. Reduce overall frequency, intensity, and duration of the anxiety so that daily functioning is not impaired. 9. Resolve the core conflict that is the source of anxiety. 10. Stabilize anxiety level while increasing ability to function on a daily basis. Diagnosis F43.23 Conditions For Discharge Achievement of treatment goals and objectives   Hayley Bolder, LCSW

## 2022-03-27 NOTE — Progress Notes (Signed)
Hayley Case is a 72 y.o. female with the following history as recorded in [employer]:  Patient Active Problem List   Diagnosis Date Noted   Type 2 diabetes mellitus with stage 4 chronic kidney disease, with long-term current use of insulin ([place]) 07/02/2021   Arthritis of hip 04/18/2021   Background diabetic retinopathy ([place]) 04/18/2021   Chronic kidney disease, stage 3b ([place]) 04/18/2021   Dyslipidemia 04/18/2021   Family history of malignant neoplasm of digestive organs 04/18/2021   Gouty arthritis 04/18/2021   Hearing loss 04/18/2021   Hyperglycemia due to type 2 diabetes mellitus ([place]) 04/18/2021   Long term (current) use of insulin ([place]) 04/18/2021   Obstructive sleep apnea (adult) (pediatric) 04/18/2021   Peripheral arterial disease ([place]) 04/18/2021   Personal history of colonic polyps 04/18/2021   Flank pain 10/14/2019   Type 2 diabetes mellitus with diabetic polyneuropathy, with long-term current use of insulin ([place]) 12/01/2018   Type 2 diabetes mellitus with retinopathy, with long-term current use of insulin ([place]) 12/01/2018   Type 2 diabetes mellitus with stage 3b chronic kidney disease, with long-term current use of insulin ([place]) 11/30/2018   Non-intractable vomiting    Acute hepatitis 07/13/2018   Acute on chronic renal insufficiency 07/12/2018   Diabetes mellitus ([place]) 12/08/2016   Essential hypertension 12/08/2016   Hyperlipidemia associated with type 2 diabetes mellitus ([place]) 12/08/2016    Current Outpatient Medications  Medication Sig Dispense Refill   ACETAMINOPHEN PO Take 650 mg by mouth every 6 (six) hours as needed for headache or moderate pain.     albuterol (VENTOLIN HFA) 108 (90 Base) MCG/ACT inhaler Inhale 2 puffs into the lungs every 6 (six) hours as needed for wheezing or shortness of breath. 1 g 3   allopurinol (ZYLOPRIM) 100 MG tablet Take 1 tablet (100 mg total) by mouth daily. 90 tablet 0   amLODipine (NORVASC) 10 MG tablet TAKE 1 TABLET BY MOUTH DAILY  90 tablet 1   Biotin 5000 MCG CAPS Take 1 capsule by mouth daily.     Cholecalciferol (VITAMIN D) 50 MCG (2000 UT) CAPS Take 4,000 Units by mouth daily.     Continuous Blood Gluc Sensor (FREESTYLE LIBRE 2 SENSOR) MISC Inject 1 Device into the skin as directed. Use as directed every 14 days. E11.21 12 each 3   Docusate Sodium (COLACE PO) Take 1 capsule by mouth daily.     Evolocumab (REPATHA SURECLICK) [id number] MG/ML SOAJ Inject 140 mg into the skin every 14 (fourteen) days. 6 mL 3   ezetimibe (ZETIA) 10 MG tablet Take 1 tablet (10 mg total) by mouth daily. 90 tablet 0   fluocinonide (LIDEX) 0.05 % external solution Apply topically in the morning and at bedtime.     insulin aspart (NOVOLOG FLEXPEN) 100 UNIT/ML FlexPen Max daily 70 units 60 mL 3   insulin glargine, 2 Unit Dial, (TOUJEO MAX SOLOSTAR) 300 UNIT/ML Solostar Pen Inject 22 Units into the skin daily at 6 (six) AM. 30 mL 3   Insulin Pen Needle (PEN NEEDLES) 32G X 4 MM MISC 1 Device by Does not apply route in the morning, at noon, in the evening, and at bedtime. 400 each 3   loratadine (CLARITIN) 10 MG tablet Take 10 mg by mouth daily as needed (seasonal allergies).     losartan (COZAAR) 50 MG tablet Take 1 tablet (50 mg total) by mouth daily. 90 tablet 0   Polyethyl Glycol-Propyl Glycol (SYSTANE FREE OP) Apply 1 drop to eye as needed.  Semaglutide (RYBELSUS) 14 MG TABS Take 1 tablet (14 mg total) by mouth daily. 90 tablet 3   vitamin B-12 (CYANOCOBALAMIN) 1000 MCG tablet Take 1,000 mcg by mouth daily.     No current facility-administered medications for this visit.    Allergies: Lisinopril, Atorvastatin, Penicillins, Rosuvastatin, Sulfamethoxazole-trimethoprim, and Penicillin g  Past Medical History:  Diagnosis Date   CKD (chronic kidney disease)    Diabetes mellitus without complication ([place])    Hyperlipidemia    Hypertension     Past Surgical History:  Procedure Laterality Date   BREAST BIOPSY     BREAST EXCISIONAL BIOPSY Left      Family History  Problem Relation Age of Onset   Skin cancer Mother    Heart attack Father    Colon cancer Sister     Social History   Tobacco Use   Smoking status: Never   Smokeless tobacco: Never  Substance Use Topics   Alcohol use: No    Subjective:   Chronic right knee pain "on and off" for a while; seems to be worse lately; will be traveling next week and concerned about how her knee might affect her ability to travel;    Objective:  Vitals:   03/27/22 1410  BP: 136/78  Pulse: 92  Resp: 18  SpO2: 98%  Weight: 189 lb (85.7 kg)  Height: '5\' 4"'$  (1.626 m)    General: Well developed, well nourished, in no acute distress  Skin : Warm and dry.  Head: Normocephalic and atraumatic  Lungs: Respirations unlabored;  Musculoskeletal: No deformities; no active joint inflammation; crepitus noted over right knee  Extremities: No edema, cyanosis, clubbing  Vessels: Symmetric bilaterally  Neurologic: Alert and oriented; speech intact; face symmetrical; moves all extremities well; CNII-XII intact without focal deficit   Assessment:  1. Chronic pain of right knee     Plan:  Update right knee X-ray; patient is referred to sports medicine for next Monday- suspect she will benefit from steroid injection. Follow up to be determined;   No follow-ups on file.  Orders Placed This Encounter  Procedures   DG Knee 1-2 Views Right    Standing Status:   Future    Number of Occurrences:   1    Standing Expiration Date:   09/27/2022    Order Specific Question:   Reason for Exam (SYMPTOM  OR DIAGNOSIS REQUIRED)    Answer:   right knee pain    Order Specific Question:   Preferred imaging location?    Answer:   [facility]    Requested Prescriptions    No prescriptions requested or ordered in this encounter

## 2022-03-31 ENCOUNTER — Other Ambulatory Visit: Payer: Self-pay

## 2022-03-31 ENCOUNTER — Ambulatory Visit (INDEPENDENT_AMBULATORY_CARE_PROVIDER_SITE_OTHER): Payer: Medicare HMO | Admitting: Family Medicine

## 2022-03-31 VITALS — BP 138/70 | Ht 65.0 in | Wt 189.0 lb

## 2022-03-31 DIAGNOSIS — M1711 Unilateral primary osteoarthritis, right knee: Secondary | ICD-10-CM

## 2022-03-31 MED ORDER — TRIAMCINOLONE ACETONIDE 40 MG/ML IJ SUSP
40.0000 mg | Freq: Once | INTRAMUSCULAR | Status: AC
  Administered 2022-03-31: 40 mg via INTRA_ARTICULAR

## 2022-03-31 NOTE — Progress Notes (Signed)
  Hayley Case - 72 y.o. female MRN [id number]  Date of birth: [birth year]  SUBJECTIVE:  Including CC & ROS.  No chief complaint on file.   Hayley Case is a 72 y.o. female that is presenting with acute right knee pain.  The pain is in the medial compartment.  Denies any injury or inciting event.  It is worse with touch.  No history of surgery.     Review of Systems See HPI   HISTORY: Past Medical, Surgical, Social, and Family History Reviewed & Updated per EMR.   Pertinent Historical Findings include:  Past Medical History:  Diagnosis Date   CKD (chronic kidney disease)    Diabetes mellitus without complication ([place])    Hyperlipidemia    Hypertension     Past Surgical History:  Procedure Laterality Date   BREAST BIOPSY     BREAST EXCISIONAL BIOPSY Left      PHYSICAL EXAM:  VS: BP 138/70 (BP Location: Left Arm, Patient Position: Sitting)   Ht 5\' 5"  (1.651 m)   Wt 189 lb (85.7 kg)   BMI 31.45 kg/m  Physical Exam Gen: NAD, alert, cooperative with exam, well-appearing MSK:  Neurovascularly intact    Limited ultrasound: Right knee pain:  Trace effusion with surgical pouch There is an anechoic collection in the medial compartment that seems more consistent with a vessel as opposed to a abscess or cyst at this point.  Summary: Findings consistent with degenerative changes of the knee  Ultrasound and interpretation by Clearance Coots, MD  Aspiration/Injection Procedure Note Hayley Case 04/04/1950  Procedure: Injection Indications: Right knee pain  Procedure Details Consent: Risks of procedure as well as the alternatives and risks of each were explained to the (patient/caregiver).  Consent for procedure obtained. Time Out: Verified patient identification, verified procedure, site/side was marked, verified correct patient position, special equipment/implants available, medications/allergies/relevent history reviewed, required imaging and test results  available.  Performed.  The area was cleaned with iodine and alcohol swabs.    The right knee superior lateral suprapatellar pouch was injected using 4 cc of 1% lidocaine and 0.4 cc of 8.4% sodium bicarbonate on a 22-gauge 1-1/2 inch needle.  The syringe was switched to mixture containing 1 cc's of 40 mg Kenalog and 4 cc's of 0.25% bupivacaine was injected.  Ultrasound was used. Images were obtained in long views showing the injection.     A sterile dressing was applied.  Patient did tolerate procedure well.     ASSESSMENT & PLAN:   Primary osteoarthritis of right knee Acutely occurring.  She does have a collection of fluid on the medial aspect of the knee.  This does appear to be more of a vessel as opposed to a cyst. -Counseled on home exercise therapy and supportive care. -Injection today. -Could consider further imaging of the medial fluid collection.   - Could consider lab work

## 2022-03-31 NOTE — Patient Instructions (Signed)
Nice to meet you Please try ice as needed  Please try the exercises   Please send me a message in MyChart with any questions or updates.  Please see me back in 4 weeks.   --Dr. Drakkar Medeiros  

## 2022-03-31 NOTE — Assessment & Plan Note (Signed)
Acutely occurring.  She does have a collection of fluid on the medial aspect of the knee.  This does appear to be more of a vessel as opposed to a cyst. -Counseled on home exercise therapy and supportive care. -Injection today. -Could consider further imaging of the medial fluid collection.   - Could consider lab work

## 2022-04-21 ENCOUNTER — Ambulatory Visit: Payer: Medicare HMO | Admitting: Pharmacist

## 2022-04-21 DIAGNOSIS — E1122 Type 2 diabetes mellitus with diabetic chronic kidney disease: Secondary | ICD-10-CM

## 2022-04-21 DIAGNOSIS — T466X5A Adverse effect of antihyperlipidemic and antiarteriosclerotic drugs, initial encounter: Secondary | ICD-10-CM

## 2022-04-21 DIAGNOSIS — E1169 Type 2 diabetes mellitus with other specified complication: Secondary | ICD-10-CM

## 2022-04-21 DIAGNOSIS — I10 Essential (primary) hypertension: Secondary | ICD-10-CM

## 2022-04-21 NOTE — Progress Notes (Signed)
Pharmacy Note  04/21/2022 Name: Hayley Case MRN: [id number] DOB: 1951/01/05  Subjective: Hayley Case is a 72 y.o. year old female who is a primary care patient of Olive Bass, FNP. Clinical Pharmacist Practitioner referral was placed to assist with medication and hyperlipidemia management.    Engaged with patient by telephone for follow up visit today.  Summary: Diabetes:  Managed by endocrinology - Dr Lonzo Cloud. Next appointment 06/27/2022 Current therapy: Toujeo 22 units at night and Novolog sliding scale based on blood glucose - max of 70 units per day.  Uses Continuous Glucose Monitor - Freestyle Libre 2.  Patient states she has been having trouble keeping Continuous Glucose Monitor on for the full 14 days.  She also reports that alerts for lost signal are a little annoying.   Hyperlipidemia: LDL goal per cardiology office < 55.   Current therapy: Reaptha 140mg  every 14 days and Continues to take ezetimibe 10mg  daily. She reports that copay for Praluent is costly ($75 per month) Has 3 other medications that are also $75 per month.   Past therapies: Atorvastatin caused increased LFTs requiring hospitalization. Rosuvastatin caused myalgias.  Started Praluent 150mg  every 14 days 06/17/2021 but stopped due to cost.  Switched to Repatha - gets assistance with medication copay cost with [employer] (good thru 12/16/2022)  Hypertension: last office blood pressure was above goal of < 130/80 (CKD).  Current hypertension therapy: losartan 50mg  daily and amlodipine 10mg  daily   Does not have blood pressure cuff at home. Patient has declined to check blood pressure at home.  Diet: tries to limit sodium and sugar intake. She does admit that she likes to have ice cream when stressed.  Exercise: none currently due to knee pain. Patient is seeing ortho and doing a few recommended exercises.   Objective: Review of patient status, including review of consultants  reports, laboratory and other test data, was performed as part of comprehensive.  Lab Results  Component Value Date   CREATININE 1.98 (H) 09/18/2020   CREATININE 2.17 (H) 05/06/2019   CREATININE 1.86 (H) 07/28/2018    Lab Results  Component Value Date   HGBA1C 7.1 (A) 12/20/2021       Component Value Date/Time   CHOL 114 05/06/2019 1222   TRIG 145 05/06/2019 1222   HDL 35 (L) 05/06/2019 1222   CHOLHDL 3.3 05/06/2019 1222   LDLCALC 54 05/06/2019 1222     Clinical ASCVD: Yes  The ASCVD Risk score (Arnett DK, et al., 2019) failed to calculate for the following reasons:   The valid total cholesterol range is 130 to 320 mg/dL    BP Readings from Last 3 Encounters:  03/31/22 138/70  03/27/22 136/78  02/14/22 126/78     Allergies  Allergen Reactions   Lisinopril Cough   Atorvastatin Other (See Comments)    Liver function test elevation Other reaction(s): liver issues   Penicillins Hives and Itching    Did it involve swelling of the face/tongue/throat, SOB, or low BP? No Did it involve sudden or severe rash/hives, skin peeling, or any reaction on the inside of your mouth or nose? Yes Did you need to seek medical attention at a hospital or doctor's office? Yes When did it last happen?      2012 If all above answers are "NO", may proceed with cephalosporin use.   Rosuvastatin Other (See Comments)    Myalgias / leg cramps   Sulfamethoxazole-Trimethoprim Hives    Other reaction(s): Unknown  Penicillin G     Other reaction(s): Unknown    Medications Reviewed Today     Reviewed by Henrene PastorEckard, Asenath Balash, RPH-CPP (Pharmacist) on 04/21/22 at 1133  Med List Status: <None>   Medication Order Taking? Sig Documenting Provider Last Dose Status Informant  ACETAMINOPHEN PO [id number] Yes Take 650 mg by mouth every 6 (six) hours as needed for headache or moderate pain. [provider] Taking Active Self  albuterol (VENTOLIN HFA) 108 (90 Base) MCG/ACT inhaler [id number] Yes Inhale  2 puffs into the lungs every 6 (six) hours as needed for wheezing or shortness of breath. Sande Rives'Neal, Hollis Thomas, MD Taking Active   allopurinol (ZYLOPRIM) 100 MG tablet [id number] Yes Take 1 tablet (100 mg total) by mouth daily. Bradd CanaryBlyth, Stacey A, MD Taking Active   amLODipine (NORVASC) 10 MG tablet [id number] Yes TAKE 1 TABLET BY MOUTH DAILY Bradd CanaryBlyth, Stacey A, MD Taking Active   Biotin 5000 MCG CAPS [id number] Yes Take 1 capsule by mouth daily. [provider] Taking Active Self  Cholecalciferol (VITAMIN D) 50 MCG (2000 UT) CAPS [id number] Yes Take 4,000 Units by mouth daily. [provider] Taking Active Self           Med Note [facility](WILSEY, JESSICA M   Thu Mar 27, 2022  2:11 PM) 4000  Continuous Blood Gluc Sensor (FREESTYLE LIBRE 2 SENSOR) [place] [id number] Yes Inject 1 Device into the skin as directed. Use as directed every 14 days. E11.21 Shamleffer, Konrad DoloresIbtehal Jaralla, MD Taking Active   Docusate Sodium (COLACE PO) [id number]  Take 1 capsule by mouth daily. [provider]  Active   Evolocumab [facility](REPATHA SURECLICK) 140 MG/ML Ivory BroadSOAJ [id number] Yes Inject 140 mg into the skin every 14 (fourteen) days. [place] OuchArida, Muhammad A, MD Taking Active   ezetimibe (ZETIA) 10 MG tablet [id number] Yes Take 1 tablet (10 mg total) by mouth daily. Bradd CanaryBlyth, Stacey A, MD Taking Active   fluocinonide (LIDEX) 0.05 % external solution [id number]  Apply topically in the morning and at bedtime. [provider]  Active            Med Note Clydie Braun(Farrell Pantaleo, Babette RelicAMMY B   Thu Jun 27, 2021 10:43 AM) Using as needed  insulin aspart (NOVOLOG FLEXPEN) 100 UNIT/ML FlexPen [id number] Yes Max daily 70 units Shamleffer, Konrad DoloresIbtehal Jaralla, MD Taking Active   insulin glargine, 2 Unit Dial, (TOUJEO MAX SOLOSTAR) 300 UNIT/ML Solostar Pen [id number] Yes Inject 22 Units into the skin daily at 6 (six) AM.  Patient taking differently: Inject 22 Units into the skin daily at 6 (six) AM. In the evening   Shamleffer, Konrad DoloresIbtehal Jaralla, MD Taking Active    Insulin Pen Needle (PEN NEEDLES) 32G X 4 MM MISC [id number] Yes 1 Device by Does not apply route in the morning, at noon, in the evening, and at bedtime. Shamleffer, Konrad DoloresIbtehal Jaralla, MD Taking Active   loratadine (CLARITIN) 10 MG tablet [id number] Yes Take 10 mg by mouth daily as needed (seasonal allergies). [provider] Taking Active Self  losartan (COZAAR) 50 MG tablet [id number] Yes Take 1 tablet (50 mg total) by mouth daily. Bradd CanaryBlyth, Stacey A, MD Taking Active   Polyethyl Glycol-Propyl Glycol [facility](SYSTANE FREE OP) [id number] Yes Apply 1 drop to eye as needed. [provider] Taking Active   Semaglutide (RYBELSUS) 14 MG TABS [id number] Yes Take 1 tablet (14 mg total) by mouth daily. Shamleffer, Konrad DoloresIbtehal Jaralla, MD Taking Active   vitamin B-12 (CYANOCOBALAMIN) 1000 MCG tablet [id number] Yes Take 1,000 mcg by mouth daily. [provider] Taking  Active             Patient Active Problem List   Diagnosis Date Noted   Primary osteoarthritis of right knee 03/31/2022   Type 2 diabetes mellitus with stage 4 chronic kidney disease, with long-term current use of insulin 07/02/2021   Arthritis of hip 04/18/2021   Background diabetic retinopathy 04/18/2021   Chronic kidney disease, stage 3b 04/18/2021   Dyslipidemia 04/18/2021   Family history of malignant neoplasm of digestive organs 04/18/2021   Gouty arthritis 04/18/2021   Hearing loss 04/18/2021   Hyperglycemia due to type 2 diabetes mellitus 04/18/2021   Long term (current) use of insulin 04/18/2021   Obstructive sleep apnea (adult) (pediatric) 04/18/2021   Peripheral arterial disease 04/18/2021   Personal history of colonic polyps 04/18/2021   Flank pain 10/14/2019   Type 2 diabetes mellitus with diabetic polyneuropathy, with long-term current use of insulin 12/01/2018   Type 2 diabetes mellitus with retinopathy, with long-term current use of insulin 12/01/2018   Type 2 diabetes mellitus with stage 3b chronic kidney  disease, with long-term current use of insulin 11/30/2018   Non-intractable vomiting    Acute hepatitis 07/13/2018   Acute on chronic renal insufficiency 07/12/2018   Diabetes mellitus 12/08/2016   Essential hypertension 12/08/2016   Hyperlipidemia associated with type 2 diabetes mellitus 12/08/2016     Medication Assistance:  Application for [employer] Hyperlipidemia grant approved ($2500 - from 12/16/2021 thru 12/16/2022).   Assessment / Plan: Diabetes: At goal of < 7.5% Continue current insulin regimen.  Continue to follow up with Dr Lonzo Cloud.  Discussed that she would possibly change to [place] 3 sensor and might get fewer disconnect alerts.  Provided patient with steps to turn off the disconnect alert if she preferrs.  Send patient Josephine Igo handout on ways to increase adhesive for sensors and information on products to have sensors stay on better like using an over bandage, Tegaderm, skin tac, Skin prep protective barrier wipes or Mastiol liquid adhesive.   Hyperlipidemia: LDL goal per cardiology office < 55.  Due to recheck lipids.  Continue Repatha 140mg  every 14 days and ezetimibe 10mg  daily.    Hypertension: last office blood pressure was above goal of < 130/80 (CKD).  Continue amlodipine 10mg  daily and losartan 50mg  daily. Discussed limiting sodium intake and purchasing a blood pressure cuff to check at home.  Unfortunately patient's [employer] plan does not cover over-the-counter products (has TRAIL retiree [place]) Supplied list of possible places to get low cost blood glucose monitor.   Medication management:  Reviewed and updated medication list Reviewed refill history and adherence  Follow Up:  Telephone follow up appointment with care management team member scheduled for:  3 to 4 months   Henrene Pastor, PharmD Clinical Pharmacist [facility]  - [facility] [phone]

## 2022-04-24 ENCOUNTER — Ambulatory Visit (INDEPENDENT_AMBULATORY_CARE_PROVIDER_SITE_OTHER): Payer: Medicare HMO | Admitting: *Deleted

## 2022-04-24 VITALS — Ht 65.0 in | Wt 189.0 lb

## 2022-04-24 DIAGNOSIS — Z0000 Encounter for general adult medical examination without abnormal findings: Secondary | ICD-10-CM | POA: Diagnosis not present

## 2022-04-24 DIAGNOSIS — Z780 Asymptomatic menopausal state: Secondary | ICD-10-CM

## 2022-04-24 NOTE — Progress Notes (Signed)
Subjective:  Pt completed ADLs, Fall risk, and SDOH during e-check in on 04/17/22.  Answers verified with pt.    Hayley Case is a 72 y.o. female who presents for Medicare Annual (Subsequent) preventive examination.  I connected with  Hayley Case on 04/24/22 by a audio enabled telemedicine application and verified that I am speaking with the correct person using two identifiers.  Patient Location: Home  Provider Location: Office/Clinic  I discussed the limitations of evaluation and management by telemedicine. The patient expressed understanding and agreed to proceed.   Review of Systems     Cardiac Risk Factors include: advanced age (>25men, >56 women);diabetes mellitus;dyslipidemia;hypertension;obesity (BMI >30kg/m2)     Objective:    Today's Vitals   04/24/22 1101  Weight: 189 lb (85.7 kg)  Height: 5\' 5"  (1.651 m)   Body mass index is 31.45 kg/m.     04/24/2022   11:13 AM 04/18/2021   10:33 AM 10/17/2020    9:33 AM 07/12/2018   11:42 AM 10/15/2016    1:52 PM  Advanced Directives  Does Patient Have a Medical Advance Directive? Yes Yes Yes No No  Type of Estate agent of [place];Living will Healthcare Power of [place];Living will Living will    Does patient want to make changes to medical advance directive? No - Patient declined      Copy of Healthcare Power of Attorney in Chart? Yes - validated most recent copy scanned in chart (See row information) Yes - validated most recent copy scanned in chart (See row information)     Would patient like information on creating a medical advance directive?    No - Patient declined No - Patient declined    Current Medications (verified) Outpatient Encounter Medications as of 04/24/2022  Medication Sig   ACETAMINOPHEN PO Take 650 mg by mouth every 6 (six) hours as needed for headache or moderate pain.   albuterol (VENTOLIN HFA) 108 (90 Base) MCG/ACT inhaler Inhale 2 puffs into the lungs every 6 (six) hours  as needed for wheezing or shortness of breath.   allopurinol (ZYLOPRIM) 100 MG tablet Take 1 tablet (100 mg total) by mouth daily.   amLODipine (NORVASC) 10 MG tablet TAKE 1 TABLET BY MOUTH DAILY   Biotin 5000 MCG CAPS Take 1 capsule by mouth daily.   Cholecalciferol (VITAMIN D) 50 MCG (2000 UT) CAPS Take 4,000 Units by mouth daily.   Continuous Blood Gluc Sensor (FREESTYLE LIBRE 2 SENSOR) MISC Inject 1 Device into the skin as directed. Use as directed every 14 days. E11.21   Docusate Sodium (COLACE PO) Take 1 capsule by mouth daily.   Evolocumab (REPATHA SURECLICK) 140 MG/ML SOAJ Inject 140 mg into the skin every 14 (fourteen) days.   ezetimibe (ZETIA) 10 MG tablet Take 1 tablet (10 mg total) by mouth daily.   fluocinonide (LIDEX) 0.05 % external solution Apply topically in the morning and at bedtime.   insulin aspart (NOVOLOG FLEXPEN) 100 UNIT/ML FlexPen Max daily 70 units   insulin glargine, 2 Unit Dial, (TOUJEO MAX SOLOSTAR) 300 UNIT/ML Solostar Pen Inject 22 Units into the skin daily at 6 (six) AM. (Patient taking differently: Inject 22 Units into the skin daily at 6 (six) AM. In the evening)   Insulin Pen Needle (PEN NEEDLES) 32G X 4 MM MISC 1 Device by Does not apply route in the morning, at noon, in the evening, and at bedtime.   loratadine (CLARITIN) 10 MG tablet Take 10 mg by mouth daily  as needed (seasonal allergies).   losartan (COZAAR) 50 MG tablet Take 1 tablet (50 mg total) by mouth daily.   Polyethyl Glycol-Propyl Glycol (SYSTANE FREE OP) Apply 1 drop to eye as needed.   Semaglutide (RYBELSUS) 14 MG TABS Take 1 tablet (14 mg total) by mouth daily.   vitamin B-12 (CYANOCOBALAMIN) 1000 MCG tablet Take 1,000 mcg by mouth daily.   No facility-administered encounter medications on file as of 04/24/2022.    Allergies (verified) Lisinopril, Atorvastatin, Penicillins, Rosuvastatin, Sulfamethoxazole-trimethoprim, and Penicillin g   History: Past Medical History:  Diagnosis Date    CKD (chronic kidney disease)    Diabetes mellitus without complication    Hyperlipidemia    Hypertension    Past Surgical History:  Procedure Laterality Date   BREAST BIOPSY     BREAST EXCISIONAL BIOPSY Left    Family History  Problem Relation Age of Onset   Skin cancer Mother    Heart attack Father    Colon cancer Sister    Social History   Socioeconomic History   Marital status: Widowed    Spouse name: Not on file   Number of children: Not on file   Years of education: Not on file   Highest education level: Not on file  Occupational History   Not on file  Tobacco Use   Smoking status: Never   Smokeless tobacco: Never  Vaping Use   Vaping Use: Never used  Substance and Sexual Activity   Alcohol use: No   Drug use: No   Sexual activity: Not Currently  Other Topics Concern   Not on file  Social History Narrative   Not on file   Social Determinants of Health   Financial Resource Strain: Medium Risk (04/21/2022)   Overall Financial Resource Strain (CARDIA)    Difficulty of Paying Living Expenses: Somewhat hard  Food Insecurity: Food Insecurity Present (04/17/2022)   Hunger Vital Sign    Worried About Running Out of Food in the Last Year: Sometimes true    Ran Out of Food in the Last Year: Sometimes true  Transportation Needs: No Transportation Needs (04/17/2022)   PRAPARE - Administrator, Civil Service (Medical): No    Lack of Transportation (Non-Medical): No  Physical Activity: Inactive (04/21/2022)   Exercise Vital Sign    Days of Exercise per Week: 0 days    Minutes of Exercise per Session: 0 min  Stress: Stress Concern Present (04/21/2022)   [name] of Occupational Health - Occupational Stress Questionnaire    Feeling of Stress : To some extent  Social Connections: Unknown (04/17/2022)   Social Connection and Isolation Panel [NHANES]    Frequency of Communication with Friends and Family: Three times a week    Frequency of Social Gatherings  with Friends and Family: Never    Attends Religious Services: Not on file    Active Member of Clubs or Organizations: Yes    Attends Banker Meetings: More than 4 times per year    Marital Status: Widowed    Tobacco Counseling Counseling given: Not Answered   Clinical Intake:  Pre-visit preparation completed: No  Pain : No/denies pain  BMI - recorded: 31.45 Nutritional Status: BMI > 30  Obese Nutritional Risks: None Diabetes: Yes CBG done?: No Did pt. bring in CBG monitor from home?: No  How often do you need to have someone help you when you read instructions, pamphlets, or other written materials from your doctor or pharmacy?: 1 - Never  Activities of Daily Living    04/17/2022   10:52 AM  In your present state of health, do you have any difficulty performing the following activities:  Hearing? 1  Vision? 0  Difficulty concentrating or making decisions? 1  Walking or climbing stairs? 1  Dressing or bathing? 0  Doing errands, shopping? 0  Preparing Food and eating ? Y  Using the Toilet? N  In the past six months, have you accidently leaked urine? Y  Do you have problems with loss of bowel control? N  Managing your Medications? N  Managing your Finances? N  Housekeeping or managing your Housekeeping? Y    Patient Care Team: Olive BassMurray, Laura Woodruff, FNP as PCP - General (Internal Medicine) O'Neal, Ronnald RampWesley Thomas, MD as PCP - Cardiology (Cardiology) Henrene PastorEckard, Tammy, RPH-CPP (Pharmacist) [place] OuchArida, Muhammad A, MD as Consulting Physician (Cardiology) Antony ContrasLyles, Graham, MD as Consulting Physician (Ophthalmology)  Indicate any recent Medical Services you may have received from other than [facility] providers in the past year (date may be approximate).     Assessment:   This is a routine wellness examination for Dannielle.  Hearing/Vision screen No results found.  Dietary issues and exercise activities discussed: Current Exercise Habits: The patient does not participate  in regular exercise at present, Exercise limited by: orthopedic condition(s)   Goals Addressed   None    Depression Screen    04/24/2022   11:03 AM 04/18/2021   10:20 AM 10/19/2020    9:38 AM 09/18/2020    8:59 AM 07/06/2018   11:02 AM 02/16/2018   11:23 AM 11/13/2017    9:59 AM  PHQ 2/9 Scores  PHQ - 2 Score 6 1 1  0 6 4 2   PHQ- 9 Score 14    20 16 13     Fall Risk    04/17/2022   10:52 AM 02/14/2022    9:32 AM 04/18/2021   10:33 AM 10/19/2020    9:37 AM 09/18/2020    8:59 AM  Fall Risk   Falls in the past year? 0 0 0 0 0  Number falls in past yr: 0 0 0 0 0  Injury with Fall? 0 0 0 0 0  Risk for fall due to : No Fall Risks No Fall Risks No Fall Risks No Fall Risks No Fall Risks  Follow up Falls evaluation completed Falls evaluation completed Education provided Falls evaluation completed Falls evaluation completed    FALL RISK PREVENTION PERTAINING TO THE HOME:  Any stairs in or around the home? No  Home free of loose throw rugs in walkways, pet beds, electrical cords, etc? Yes  Adequate lighting in your home to reduce risk of falls? Yes   ASSISTIVE DEVICES UTILIZED TO PREVENT FALLS:  Life alert? No  Use of a cane, walker or w/c?  Uses cane sometimes Grab bars in the bathroom? No  Shower chair or bench in shower? Yes  Elevated toilet seat or a handicapped toilet? No   TIMED UP AND GO:  Was the test performed?  No, audio visit .   Cognitive Function:        04/24/2022   11:09 AM 04/18/2021    1:04 PM 04/18/2021   10:35 AM  6CIT Screen  What Year? 0 points 0 points 0 points  What month? 0 points 0 points 0 points  What time? 0 points 0 points 0 points  Count back from 20 0 points 0 points 0 points  Months in reverse 0 points 0 points 0 points  Repeat phrase 0 points 0 points 0 points  Total Score 0 points 0 points 0 points    Immunizations Immunization History  Administered Date(s) Administered   Fluad Quad(high Dose 65+) 10/19/2020, 11/01/2021   Influenza Split  12/17/2018, 12/23/2018   Influenza, High Dose Seasonal PF 12/16/2019   Influenza,inj,Quad PF,6+ Mos 11/13/2017   Influenza-Unspecified 12/17/2018   PFIZER Comirnaty(Gray Top)Covid-19 Tri-Sucrose Vaccine 11/18/2019   PFIZER(Purple Top)SARS-COV-2 Vaccination 03/11/2019, 04/05/2019, 11/18/2019   PNEUMOCOCCAL CONJUGATE-20 02/14/2022   Pneumococcal Polysaccharide-23 12/23/2018   Tdap 07/27/2019   Zoster, Live 07/27/2019, 12/01/2019    TDAP status: Up to date  Flu Vaccine status: Up to date  Pneumococcal vaccine status: Up to date  Covid-19 vaccine status: Information provided on how to obtain vaccines.   Qualifies for Shingles Vaccine? Yes   Zostavax completed No   Shingrix Completed?: Yes  Screening Tests Health Maintenance  Topic Date Due   Diabetic kidney evaluation - Urine ACR  Never done   Zoster Vaccines- Shingrix (1 of 2) Never done   FOOT EXAM  02/09/2018   COVID-19 Vaccine (5 - 2023-24 season) 09/13/2021   Diabetic kidney evaluation - eGFR measurement  09/18/2021   Medicare Annual Wellness (AWV)  04/19/2022   HEMOGLOBIN A1C  06/21/2022   INFLUENZA VACCINE  08/14/2022   OPHTHALMOLOGY EXAM  09/19/2022   MAMMOGRAM  08/14/2023   COLONOSCOPY (Pts 45-69yrs Insurance coverage will need to be confirmed)  05/26/2027   DTaP/Tdap/Td (2 - Td or Tdap) 07/26/2029   Pneumonia Vaccine 61+ Years old  Completed   DEXA SCAN  Completed   Hepatitis C Screening  Completed   HPV VACCINES  Aged Out    Health Maintenance  Health Maintenance Due  Topic Date Due   Diabetic kidney evaluation - Urine ACR  Never done   Zoster Vaccines- Shingrix (1 of 2) Never done   FOOT EXAM  02/09/2018   COVID-19 Vaccine (5 - 2023-24 season) 09/13/2021   Diabetic kidney evaluation - eGFR measurement  09/18/2021   Medicare Annual Wellness (AWV)  04/19/2022    Colorectal cancer screening: Type of screening: Colonoscopy. Completed 05/25/17. Repeat every 10 years  Mammogram status: Completed 08/13/21.  Repeat every year  Bone Density status: Ordered 04/24/22. Pt provided with contact info and advised to call to schedule appt.  Lung Cancer Screening: (Low Dose CT Chest recommended if Age 35-80 years, 30 pack-year currently smoking OR have quit w/in 15years.) does not qualify.   Additional Screening:  Hepatitis C Screening: does qualify; Completed 07/13/18  Vision Screening: Recommended annual ophthalmology exams for early detection of glaucoma and other disorders of the eye. Is the patient up to date with their annual eye exam?  Yes  Who is the provider or what is the name of the office in which the patient attends annual eye exams? Dr. Randon Goldsmith If pt is not established with a provider, would they like to be referred to a provider to establish care? No .   Dental Screening: Recommended annual dental exams for proper oral hygiene  Community Resource Referral / Chronic Care Management: CRR required this visit?  No   CCM required this visit?  No      Plan:     I have personally reviewed and noted the following in the patient's chart:   Medical and social history Use of alcohol, tobacco or illicit drugs  Current medications and supplements including opioid prescriptions. Patient is not currently taking opioid prescriptions. Functional ability and status Nutritional status Physical activity Advanced  directives List of other physicians Hospitalizations, surgeries, and ER visits in previous 12 months Vitals Screenings to include cognitive, depression, and falls Referrals and appointments  In addition, I have reviewed and discussed with patient certain preventive protocols, quality metrics, and best practice recommendations. A written personalized care plan for preventive services as well as general preventive health recommendations were provided to patient.   Due to this being a telephonic visit, the after visit summary with patients personalized plan was offered to patient via mail  or my-chart.  Patient would like to access on my-chart.  Donne Anon, [place]   04/24/2022   Nurse Notes: None

## 2022-04-24 NOTE — Patient Instructions (Signed)
Hayley Case , Thank you for taking time to come for your Medicare Wellness Visit. I appreciate your ongoing commitment to your health goals. Please review the following plan we discussed and let me know if I can assist you in the future.     This is a list of the screening recommended for you and due dates:  Health Maintenance  Topic Date Due   Yearly kidney health urinalysis for diabetes  Never done   Zoster (Shingles) Vaccine (1 of 2) Never done   Complete foot exam   02/09/2018   COVID-19 Vaccine (5 - 2023-24 season) 09/13/2021   Yearly kidney function blood test for diabetes  09/18/2021   Hemoglobin A1C  06/21/2022   Flu Shot  08/14/2022   Eye exam for diabetics  09/19/2022   Medicare Annual Wellness Visit  04/24/2023   Mammogram  08/14/2023   Colon Cancer Screening  05/26/2027   DTaP/Tdap/Td vaccine (2 - Td or Tdap) 07/26/2029   Pneumonia Vaccine  Completed   DEXA scan (bone density measurement)  Completed   Hepatitis C Screening: USPSTF Recommendation to screen - Ages 55-79 yo.  Completed   HPV Vaccine  Aged Out     Next appointment: Follow up in one year for your annual wellness visit.   Preventive Care 15 Years and Older, Female Preventive care refers to lifestyle choices and visits with your health care provider that can promote health and wellness. What does preventive care include? A yearly physical exam. This is also called an annual well check. Dental exams once or twice a year. Routine eye exams. Ask your health care provider how often you should have your eyes checked. Personal lifestyle choices, including: Daily care of your teeth and gums. Regular physical activity. Eating a healthy diet. Avoiding tobacco and drug use. Limiting alcohol use. Practicing safe sex. Taking low-dose aspirin every day. Taking vitamin and mineral supplements as recommended by your health care provider. What happens during an annual well check? The services and screenings done by  your health care provider during your annual well check will depend on your age, overall health, lifestyle risk factors, and family history of disease. Counseling  Your health care provider may ask you questions about your: Alcohol use. Tobacco use. Drug use. Emotional well-being. Home and relationship well-being. Sexual activity. Eating habits. History of falls. Memory and ability to understand (cognition). Work and work Astronomer. Reproductive health. Screening  You may have the following tests or measurements: Height, weight, and BMI. Blood pressure. Lipid and cholesterol levels. These may be checked every 5 years, or more frequently if you are over 72 years old. Skin check. Lung cancer screening. You may have this screening every year starting at age 64 if you have a 30-pack-year history of smoking and currently smoke or have quit within the past 15 years. Fecal occult blood test (FOBT) of the stool. You may have this test every year starting at age 53. Flexible sigmoidoscopy or colonoscopy. You may have a sigmoidoscopy every 5 years or a colonoscopy every 10 years starting at age 17. Hepatitis C blood test. Hepatitis B blood test. Sexually transmitted disease (STD) testing. Diabetes screening. This is done by checking your blood sugar (glucose) after you have not eaten for a while (fasting). You may have this done every 1-3 years. Bone density scan. This is done to screen for osteoporosis. You may have this done starting at age 73. Mammogram. This may be done every 1-2 years. Talk to your health care  provider about how often you should have regular mammograms. Talk with your health care provider about your test results, treatment options, and if necessary, the need for more tests. Vaccines  Your health care provider may recommend certain vaccines, such as: Influenza vaccine. This is recommended every year. Tetanus, diphtheria, and acellular pertussis (Tdap, Td) vaccine. You  may need a Td booster every 10 years. Zoster vaccine. You may need this after age 21. Pneumococcal 13-valent conjugate (PCV13) vaccine. One dose is recommended after age 22. Pneumococcal polysaccharide (PPSV23) vaccine. One dose is recommended after age 69. Talk to your health care provider about which screenings and vaccines you need and how often you need them. This information is not intended to replace advice given to you by your health care provider. Make sure you discuss any questions you have with your health care provider. Document Released: 01/26/2015 Document Revised: 09/19/2015 Document Reviewed: 10/31/2014 Elsevier Interactive Patient Education  2017 [place] Prevention in the Home Falls can cause injuries. They can happen to people of all ages. There are many things you can do to make your home safe and to help prevent falls. What can I do on the outside of my home? Regularly fix the edges of walkways and driveways and fix any cracks. Remove anything that might make you trip as you walk through a door, such as a raised step or threshold. Trim any bushes or trees on the path to your home. Use bright outdoor lighting. Clear any walking paths of anything that might make someone trip, such as rocks or tools. Regularly check to see if handrails are loose or broken. Make sure that both sides of any steps have handrails. Any raised decks and porches should have guardrails on the edges. Have any leaves, snow, or ice cleared regularly. Use sand or salt on walking paths during winter. Clean up any spills in your garage right away. This includes oil or grease spills. What can I do in the bathroom? Use night lights. Install grab bars by the toilet and in the tub and shower. Do not use towel bars as grab bars. Use non-skid mats or decals in the tub or shower. If you need to sit down in the shower, use a plastic, non-slip stool. Keep the floor dry. Clean up any water that spills  on the floor as soon as it happens. Remove soap buildup in the tub or shower regularly. Attach bath mats securely with double-sided non-slip rug tape. Do not have throw rugs and other things on the floor that can make you trip. What can I do in the bedroom? Use night lights. Make sure that you have a light by your bed that is easy to reach. Do not use any sheets or blankets that are too big for your bed. They should not hang down onto the floor. Have a firm chair that has side arms. You can use this for support while you get dressed. Do not have throw rugs and other things on the floor that can make you trip. What can I do in the kitchen? Clean up any spills right away. Avoid walking on wet floors. Keep items that you use a lot in easy-to-reach places. If you need to reach something above you, use a strong step stool that has a grab bar. Keep electrical cords out of the way. Do not use floor polish or wax that makes floors slippery. If you must use wax, use non-skid floor wax. Do not have throw rugs  and other things on the floor that can make you trip. What can I do with my stairs? Do not leave any items on the stairs. Make sure that there are handrails on both sides of the stairs and use them. Fix handrails that are broken or loose. Make sure that handrails are as long as the stairways. Check any carpeting to make sure that it is firmly attached to the stairs. Fix any carpet that is loose or worn. Avoid having throw rugs at the top or bottom of the stairs. If you do have throw rugs, attach them to the floor with carpet tape. Make sure that you have a light switch at the top of the stairs and the bottom of the stairs. If you do not have them, ask someone to add them for you. What else can I do to help prevent falls? Wear shoes that: Do not have high heels. Have rubber bottoms. Are comfortable and fit you well. Are closed at the toe. Do not wear sandals. If you use a stepladder: Make  sure that it is fully opened. Do not climb a closed stepladder. Make sure that both sides of the stepladder are locked into place. Ask someone to hold it for you, if possible. Clearly mark and make sure that you can see: Any grab bars or handrails. First and last steps. Where the edge of each step is. Use tools that help you move around (mobility aids) if they are needed. These include: Canes. Walkers. Scooters. Crutches. Turn on the lights when you go into a dark area. Replace any light bulbs as soon as they burn out. Set up your furniture so you have a clear path. Avoid moving your furniture around. If any of your floors are uneven, fix them. If there are any pets around you, be aware of where they are. Review your medicines with your doctor. Some medicines can make you feel dizzy. This can increase your chance of falling. Ask your doctor what other things that you can do to help prevent falls. This information is not intended to replace advice given to you by your health care provider. Make sure you discuss any questions you have with your health care provider. Document Released: 10/26/2008 Document Revised: 06/07/2015 Document Reviewed: 02/03/2014 Elsevier Interactive Patient Education  2017 [employer].

## 2022-04-25 ENCOUNTER — Ambulatory Visit: Payer: Medicare HMO | Admitting: Psychology

## 2022-04-28 ENCOUNTER — Ambulatory Visit (INDEPENDENT_AMBULATORY_CARE_PROVIDER_SITE_OTHER): Payer: Medicare HMO | Admitting: Family Medicine

## 2022-04-28 ENCOUNTER — Encounter: Payer: Self-pay | Admitting: *Deleted

## 2022-04-28 VITALS — BP 134/74 | Ht 65.0 in | Wt 190.0 lb

## 2022-04-28 DIAGNOSIS — M1711 Unilateral primary osteoarthritis, right knee: Secondary | ICD-10-CM | POA: Diagnosis not present

## 2022-04-28 NOTE — Patient Instructions (Signed)
Good to see you Please use ice as needed  You can try voltaren over the counter gel  We'll call with the gel injection benefits  We have made a referral to physical therapy   Please send me a message in MyChart with any questions or updates.  Please see me back to have the injection .   --Dr. Jordan Likes

## 2022-04-28 NOTE — Assessment & Plan Note (Addendum)
Acutely occurring.  Received improvement with an intra-articular steroid injection but the pain has slowly returned.  The patient has experienced an inadequate response or adverse effects with non- pharmacologic treatment options such as home exercise therapy.  She is unable to tolerate oral or topical anti-inflammatories as she has chronic kidney disease stage III. -Counseled on home exercise therapy and supportive care. -Referral to physical therapy. -Pursue gel injection. -Could consider medial unloader brace

## 2022-04-28 NOTE — Progress Notes (Addendum)
  Hayley Case - 72 y.o. female MRN [id number]  Date of birth: 02-May-1950  SUBJECTIVE:  Including CC & ROS.  No chief complaint on file.   Hayley Case is a 72 y.o. female that is following up for her right knee pain.  She did well with the steroid injection but the pain has slowly returned.  She is feeling the pain in the medial aspect as well as the proximal tibia.  The pain did completely go away with the steroid injection.   Review of Systems See HPI   HISTORY: Past Medical, Surgical, Social, and Family History Reviewed & Updated per EMR.   Pertinent Historical Findings include:  Past Medical History:  Diagnosis Date   CKD (chronic kidney disease)    Diabetes mellitus without complication (HCC)    Hyperlipidemia    Hypertension     Past Surgical History:  Procedure Laterality Date   BREAST BIOPSY     BREAST EXCISIONAL BIOPSY Left      PHYSICAL EXAM:  VS: BP 134/74   Ht 5\' 5"  (1.651 m)   Wt 190 lb (86.2 kg)   BMI 31.62 kg/m  Physical Exam Gen: NAD, alert, cooperative with exam, well-appearing MSK:  Neurovascularly intact       ASSESSMENT & PLAN:   Primary osteoarthritis of right knee Acutely occurring.  Received improvement with an intra-articular steroid injection but the pain has slowly returned.  The patient has experienced an inadequate response or adverse effects with non- pharmacologic treatment options such as home exercise therapy.  She is unable to tolerate oral or topical anti-inflammatories as she has chronic kidney disease stage III. -Counseled on home exercise therapy and supportive care. -Referral to physical therapy. -Pursue gel injection. -Could consider medial unloader brace

## 2022-04-29 ENCOUNTER — Telehealth: Payer: Self-pay | Admitting: *Deleted

## 2022-04-29 NOTE — Telephone Encounter (Signed)
Right knee Durolane [id number] and CPT 20610 is covered at 85%. Patient has met deductible. Once OOP max of $1300 has been met, coverage goes to 100%. Precert is required. OV notes and right knee xray faxed to [id number].

## 2022-05-01 ENCOUNTER — Other Ambulatory Visit (HOSPITAL_BASED_OUTPATIENT_CLINIC_OR_DEPARTMENT_OTHER): Payer: Medicare HMO

## 2022-05-02 ENCOUNTER — Other Ambulatory Visit: Payer: Self-pay | Admitting: Family Medicine

## 2022-05-02 DIAGNOSIS — I10 Essential (primary) hypertension: Secondary | ICD-10-CM

## 2022-05-03 ENCOUNTER — Other Ambulatory Visit: Payer: Self-pay | Admitting: Family Medicine

## 2022-05-07 NOTE — Therapy (Signed)
OUTPATIENT PHYSICAL THERAPY LOWER EXTREMITY EVALUATION   Patient Name: Hayley Case MRN: [id number] DOB:[birth year], 72 y.o., female Today's Date: 05/08/2022  END OF SESSION:  PT End of Session - 05/08/22 1012     Visit Number 1    Date for PT Re-Evaluation 07/03/22    Authorization Type [employer] MCR    PT Start Time 1015    PT Stop Time 1059    PT Time Calculation (min) 44 min    Activity Tolerance Patient tolerated treatment well    Behavior During Therapy WFL for tasks assessed/performed             Past Medical History:  Diagnosis Date   CKD (chronic kidney disease)    Diabetes mellitus without complication    Hyperlipidemia    Hypertension    Past Surgical History:  Procedure Laterality Date   BREAST BIOPSY     BREAST EXCISIONAL BIOPSY Left    Patient Active Problem List   Diagnosis Date Noted   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 04/21/2022   Primary osteoarthritis of right knee 03/31/2022   Type 2 diabetes mellitus with stage 4 chronic kidney disease, with long-term current use of insulin 07/02/2021   Arthritis of hip 04/18/2021   Background diabetic retinopathy 04/18/2021   Chronic kidney disease, stage 3b 04/18/2021   Dyslipidemia 04/18/2021   Family history of malignant neoplasm of digestive organs 04/18/2021   Gouty arthritis 04/18/2021   Hearing loss 04/18/2021   Hyperglycemia due to type 2 diabetes mellitus 04/18/2021   Long term (current) use of insulin 04/18/2021   Obstructive sleep apnea (adult) (pediatric) 04/18/2021   Peripheral arterial disease 04/18/2021   Personal history of colonic polyps 04/18/2021   Flank pain 10/14/2019   Type 2 diabetes mellitus with diabetic polyneuropathy, with long-term current use of insulin 12/01/2018   Type 2 diabetes mellitus with retinopathy, with long-term current use of insulin 12/01/2018   Type 2 diabetes mellitus with stage 3b chronic kidney disease, with long-term current use of insulin 11/30/2018    Non-intractable vomiting    Acute hepatitis 07/13/2018   Acute on chronic renal insufficiency 07/12/2018   Diabetes mellitus 12/08/2016   Essential hypertension 12/08/2016   Hyperlipidemia associated with type 2 diabetes mellitus 12/08/2016    PCP: Olive Bass, FNP   REFERRING PROVIDER: Myra Rude, MD   REFERRING DIAG: M17.11 (ICD-10-CM) - Primary osteoarthritis of right knee   THERAPY DIAG:  Chronic pain of right knee  Stiffness of right knee, not elsewhere classified  Other abnormalities of gait and mobility  Unsteadiness on feet  Cramp and spasm  Rationale for Evaluation and Treatment: Rehabilitation  ONSET DATE: last month or so  SUBJECTIVE:   SUBJECTIVE STATEMENT: On and off R knee pain x 1 year. Drove to [place] with her son and that irriated it. Causes her to limp. Tylenol helps.  Pt reports limitations in sitting/standing/walking of 1 hour /15-20 min stdg even cooking breakfast /15-20 min walking (shopping). Using cart helps. Limited driving to 15 min. Has a cane but doesn't use much. Injection 03/31/22. Traveled to [place] and [place] right after the injection. May have gel injections. Patient also reports unsteadiness with gait.  PERTINENT HISTORY: DM, CKD, HTN, LBP, HOH, HA PAIN:  Are you having pain? Yes: NPRS scale: 5 now up to 8-9/10 Pain location: R knee sometimes goes further  Pain description: intense sharp Aggravating factors: prolonged sitting, standing and walking Relieving factors: Tylenol, ice, reclining   PRECAUTIONS: None  WEIGHT BEARING RESTRICTIONS: No  FALLS:  Has patient fallen in last 6 months? No  LIVING ENVIRONMENT: Lives with: lives with their son Lives in: House/apartment Stairs: No Has following equipment at home: Single point cane  OCCUPATION: retired  PLOF: Independent  PATIENT GOALS: to find a way to tolerate the pain  NEXT MD VISIT: waiting to schedule gel injections  OBJECTIVE:    DIAGNOSTIC FINDINGS:  XR IMPRESSION: 1. Patellofemoral degenerative changes. 2. Calcified atherosclerotic changes in the femoral vessels.  [place]: degenerative changes in R knee  PATIENT SURVEYS:  FOTO 31 (44 predicted)  COGNITION: Overall cognitive status: Within functional limits for tasks assessed     SENSATION: Neuropathy in B hands and feet   MUSCLE LENGTH: HS:Bil HS marked Quads: tight R Piriformis: WNL Hip Flexors: WNL  Heelcords: tight B   POSTURE: rounded shoulders and forward head  PALPATION: Palpation: TTP at medial knee, med patella, joint line and Patellar tendon. Also R rectus femoris, VMO and ADDuctors Patellar Mobility: painful med/lat, but WNL     LOWER EXTREMITY ROM:   A/P ROM Right eval Left eval  Knee flexion 114/118 132  Knee extension 0 0   (Blank rows = not tested)  LOWER EXTREMITY MMT: 5/5  LOWER EXTREMITY SPECIAL TESTS:  Step up/Down: TBD  FUNCTIONAL TESTS:  5 times sit to stand: 19.66 sec 6 minute walk test: TBD Dynamic Gait Index: TBD  GAIT: Distance walked: 20 Assistive device utilized: None Level of assistance: Complete Independence Comments: No significant gait deviations, but further functional gait assessments shoulder be completed   TODAY'S TREATMENT:                                                                                                                              DATE:   05/08/22 See pt ed and HEP  PATIENT EDUCATION:  Education details: PT eval findings, anticipated POC, need for further assessment of gait and balance, initial HEP, and aquatic PT  Person educated: Patient Education method: Explanation, Demonstration, Verbal cues, and Handouts Education comprehension: verbalized understanding and returned demonstration  HOME EXERCISE PROGRAM: Access Code: [email or web address] URL: [email or web address] Date: 05/08/2022 Prepared by: Raynelle Fanning  Exercises - Supine Quadricep Sets  - 1 x daily - 7 x  weekly - 1-2 sets - 15 reps - 5 second hold - Supine Knee Extension Strengthening  - 1 x daily - 7 x weekly - 1-2 sets - 10 reps - 5 sec hold - Supine Active Straight Leg Raise (Mirrored)  - 1 x daily - 7 x weekly - 1-2 sets - 10 reps - Seated Hamstring Stretch  - 2 x daily - 7 x weekly - 1 sets - 3 reps - 30-60 sec hold - Quadricep Stretch with Chair and Counter Support  - 2 x daily - 7 x weekly - 1 sets - 3 reps - 30-60 sec hold  ASSESSMENT:  CLINICAL IMPRESSION: DEVONNA OBOYLE is a 72 y.o. female who  was seen today for physical therapy evaluation and treatment for R knee pain beginning over a year ago but worsening in the past 6 weeks following prolonged driving during a trip. Pain is worse with sitting, standing and walking.  She has deficits in R knee ROM and flexibility. She also reports that she is unsteady with walking. She denies any falls. Her 5xSTS score of 19.7 sec indicates she is a fall risk. Additional functional gait and balance assessments should be completed. Tonianne will benefit from skilled PT, both land and aquatic, to address these deficits. .   OBJECTIVE IMPAIRMENTS: decreased activity tolerance, decreased balance, difficulty walking, decreased ROM, decreased strength, increased muscle spasms, impaired flexibility, and pain.   ACTIVITY LIMITATIONS: sitting, standing, squatting, stairs, bed mobility, and locomotion level  PARTICIPATION LIMITATIONS: meal prep, cleaning, driving, and shopping  PERSONAL FACTORS: Transportation and 3+ comorbidities: DM, CKD, HTN, LBP, HOH, HA  are also affecting patient's functional outcome.   REHAB POTENTIAL: Good  CLINICAL DECISION MAKING: Stable/uncomplicated  EVALUATION COMPLEXITY: Low   SHORT TERM GOALS: Target date: 05/22/2022    Ind with initial HEP Baseline: Goal status: INITIAL  2.  Assess DGI and by 2nd land visit Baseline:  Goal status: INITIAL   LONG TERM GOALS: Target date: 07/03/2022   Ind with advanced HEP and  its progression Baseline:  Goal status: INITIAL  2. Improved R knee ROM to 0-125 deg to normalize gait and functional mobility Baseline:  Goal status: INITIAL  3.  Improved LE strength as evidenced by decrease 5XSTS by >= 2.3 sec  Baseline:  Goal status: INITIAL  4.  Able to safely amb 600 feet with knee pain <= 4/10 to improve access to community. Baseline:  Goal status: INITIAL  5.  Decreased pain in the R knee by >=75% with ADLs to improve QOL. Baseline:  Goal status: INITIAL  6.  Pt to demonstrate >= 19/24 on the DGI to decrease fall risk. Baseline:  Goal status: INITIAL  7.  Improved FOTO to 44 showing functional improvement Baseline: 31 Goal status: INITIAL    PLAN:  PT FREQUENCY: 2x/week  PT DURATION: 8 weeks  PLANNED INTERVENTIONS: Therapeutic exercises, Therapeutic activity, Neuromuscular re-education, Balance training, Gait training, Patient/Family education, Self Care, Joint mobilization, Stair training, Aquatic Therapy, Dry Needling, Electrical stimulation, Spinal mobilization, Cryotherapy, Moist heat, Taping, Ultrasound, and Manual therapy  PLAN FOR NEXT SESSION: Assess DGI, , Work on R knee ROM and strength, add calf stretch to HEP, DN/MT prn   Marcela Alatorre, PT 05/08/2022, 2:23 PM

## 2022-05-08 ENCOUNTER — Other Ambulatory Visit: Payer: Self-pay

## 2022-05-08 ENCOUNTER — Ambulatory Visit: Payer: Medicare HMO | Attending: Family Medicine | Admitting: Physical Therapy

## 2022-05-08 ENCOUNTER — Encounter: Payer: Self-pay | Admitting: Physical Therapy

## 2022-05-08 DIAGNOSIS — R2681 Unsteadiness on feet: Secondary | ICD-10-CM | POA: Diagnosis present

## 2022-05-08 DIAGNOSIS — G8929 Other chronic pain: Secondary | ICD-10-CM | POA: Diagnosis present

## 2022-05-08 DIAGNOSIS — M25661 Stiffness of right knee, not elsewhere classified: Secondary | ICD-10-CM

## 2022-05-08 DIAGNOSIS — R2689 Other abnormalities of gait and mobility: Secondary | ICD-10-CM

## 2022-05-08 DIAGNOSIS — M1711 Unilateral primary osteoarthritis, right knee: Secondary | ICD-10-CM | POA: Insufficient documentation

## 2022-05-08 DIAGNOSIS — R252 Cramp and spasm: Secondary | ICD-10-CM | POA: Diagnosis present

## 2022-05-08 DIAGNOSIS — M25561 Pain in right knee: Secondary | ICD-10-CM | POA: Insufficient documentation

## 2022-05-11 NOTE — Therapy (Signed)
OUTPATIENT PHYSICAL THERAPY TREATMENT   Patient Name: Hayley Case MRN: [id number] DOB:03/02/1950, 72 y.o., female Today's Date: 05/12/2022  END OF SESSION:  PT End of Session - 05/12/22 0848     Visit Number 2    Date for PT Re-Evaluation 07/03/22    Authorization Type [employer] MCR    PT Start Time 0848    PT Stop Time 0934    PT Time Calculation (min) 46 min    Activity Tolerance Patient tolerated treatment well    Behavior During Therapy WFL for tasks assessed/performed             Past Medical History:  Diagnosis Date   CKD (chronic kidney disease)    Diabetes mellitus without complication (HCC)    Hyperlipidemia    Hypertension    Past Surgical History:  Procedure Laterality Date   BREAST BIOPSY     BREAST EXCISIONAL BIOPSY Left    Patient Active Problem List   Diagnosis Date Noted   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 04/21/2022   Primary osteoarthritis of right knee 03/31/2022   Type 2 diabetes mellitus with stage 4 chronic kidney disease, with long-term current use of insulin (HCC) 07/02/2021   Arthritis of hip 04/18/2021   Background diabetic retinopathy (HCC) 04/18/2021   Chronic kidney disease, stage 3b (HCC) 04/18/2021   Dyslipidemia 04/18/2021   Family history of malignant neoplasm of digestive organs 04/18/2021   Gouty arthritis 04/18/2021   Hearing loss 04/18/2021   Hyperglycemia due to type 2 diabetes mellitus (HCC) 04/18/2021   Long term (current) use of insulin (HCC) 04/18/2021   Obstructive sleep apnea (adult) (pediatric) 04/18/2021   Peripheral arterial disease (HCC) 04/18/2021   Personal history of colonic polyps 04/18/2021   Flank pain 10/14/2019   Type 2 diabetes mellitus with diabetic polyneuropathy, with long-term current use of insulin (HCC) 12/01/2018   Type 2 diabetes mellitus with retinopathy, with long-term current use of insulin (HCC) 12/01/2018   Type 2 diabetes mellitus with stage 3b chronic kidney disease, with  long-term current use of insulin (HCC) 11/30/2018   Non-intractable vomiting    Acute hepatitis 07/13/2018   Acute on chronic renal insufficiency 07/12/2018   Diabetes mellitus (HCC) 12/08/2016   Essential hypertension 12/08/2016   Hyperlipidemia associated with type 2 diabetes mellitus (HCC) 12/08/2016    PCP: Olive Bass, FNP   REFERRING PROVIDER: Myra Rude, MD   REFERRING DIAG: M17.11 (ICD-10-CM) - Primary osteoarthritis of right knee   THERAPY DIAG:  Chronic pain of right knee  Stiffness of right knee, not elsewhere classified  Unsteadiness on feet  Cramp and spasm  Other abnormalities of gait and mobility  Rationale for Evaluation and Treatment: Rehabilitation  ONSET DATE: last month or so  SUBJECTIVE:   SUBJECTIVE STATEMENT: I'm the same.  PERTINENT HISTORY: DM, CKD, HTN, LBP, HOH, HA PAIN:  Are you having pain? Yes: NPRS scale: 5 now up to 8-9/10 Pain location: R knee sometimes goes further  Pain description: intense sharp Aggravating factors: prolonged sitting, standing and walking Relieving factors: Tylenol, ice, reclining   PRECAUTIONS: None  WEIGHT BEARING RESTRICTIONS: No  FALLS:  Has patient fallen in last 6 months? No  LIVING ENVIRONMENT: Lives with: lives with their son Lives in: House/apartment Stairs: No Has following equipment at home: Single point cane  OCCUPATION: retired  PLOF: Independent  PATIENT GOALS: to find a way to tolerate the pain  NEXT MD VISIT: waiting to schedule gel injections  OBJECTIVE:  DIAGNOSTIC FINDINGS:  XR IMPRESSION: 1. Patellofemoral degenerative changes. 2. Calcified atherosclerotic changes in the femoral vessels.  [place]: degenerative changes in R knee  PATIENT SURVEYS:  FOTO 31 (44 predicted)  COGNITION: Overall cognitive status: Within functional limits for tasks assessed     SENSATION: Neuropathy in B hands and feet   MUSCLE LENGTH: HS:Bil HS marked Quads: tight  R Piriformis: WNL Hip Flexors: WNL  Heelcords: tight B   POSTURE: rounded shoulders and forward head  PALPATION: Palpation: TTP at medial knee, med patella, joint line and Patellar tendon. Also R rectus femoris, VMO and ADDuctors Patellar Mobility: painful med/lat, but WNL   LOWER EXTREMITY ROM:   A/P ROM Right eval Left eval  Knee flexion 114/118 132  Knee extension 0 0   (Blank rows = not tested)  LOWER EXTREMITY MMT: 5/5  LOWER EXTREMITY SPECIAL TESTS:  Step up/Down: TBD  FUNCTIONAL TESTS:  5 times sit to stand: 19.66 sec 6 minute walk test: 894 ft pain 6/10  05/12/22 Dynamic Gait Index: 19/24  05/12/22 Berg Balance Assessment: 51/56 05/12/22  GAIT: Distance walked: 20 Assistive device utilized: None Level of assistance: Complete Independence Comments: No significant gait deviations, but further functional gait assessments shoulder be completed   TODAY'S TREATMENT:                                                                                                                              DATE:   05/12/22   TA:  - 894 ft pain 6/10  DGI - 19/24  BERG 51/56  Therapeutic Exercise: to improve flexibility, strength and mobility.  Verbal and tactile cues throughout for technique Seated HS stretch 2x30 sec Standing quad stretch with foot on mat table, hands on chair back 2x30 sec Gastroc stretch 2x30 sec B Soleus stretch R x 30 sec Supine quad set x 10 5 sec hold SAQ x 10 5 sec hold Supine SLR 2x5 some pain at end    05/08/22 See pt ed and HEP  PATIENT EDUCATION:  Education details: PT eval findings, anticipated POC, need for further assessment of gait and balance, initial HEP, and aquatic PT  Person educated: Patient Education method: Explanation, Demonstration, Verbal cues, and Handouts Education comprehension: verbalized understanding and returned demonstration  HOME EXERCISE PROGRAM: Access Code: [email or web address] URL:  [email or web address] Date: 05/12/2022 Prepared by: Raynelle Fanning  Exercises - Supine Quadricep Sets  - 1 x daily - 7 x weekly - 1-2 sets - 15 reps - 5 second hold - Supine Knee Extension Strengthening  - 1 x daily - 7 x weekly - 1-2 sets - 10 reps - 5 sec hold - Supine Active Straight Leg Raise (Mirrored)  - 1 x daily - 7 x weekly - 1-2 sets - 10 reps - Seated Hamstring Stretch  - 2 x daily - 7 x weekly - 1 sets - 3 reps - 30-60 sec hold - Theatre manager with Chair and Counter Support  -  2 x daily - 7 x weekly - 1 sets - 3 reps - 30-60 sec hold - Standing Gastroc Stretch  - 2 x daily - 7 x weekly - 3 reps - 30s hold - Standing Soleus Stretch (Mirrored)  - 2 x daily - 7 x weekly - 3 reps - 30s hold  ASSESSMENT:  CLINICAL IMPRESSION: Further assessment of Tawan's gait and balance was completed today (see objective). She has deficits with change in gait speed, turning during gait and higher level balance activities such as SLS and tandem stance. HEP reviewed and progressed. No goals met as only second visit. Bostyn continues to demonstrate potential for improvement and would benefit from continued skilled therapy to address impairments.   .   OBJECTIVE IMPAIRMENTS: decreased activity tolerance, decreased balance, difficulty walking, decreased ROM, decreased strength, increased muscle spasms, impaired flexibility, and pain.   ACTIVITY LIMITATIONS: sitting, standing, squatting, stairs, bed mobility, and locomotion level  PARTICIPATION LIMITATIONS: meal prep, cleaning, driving, and shopping  PERSONAL FACTORS: Transportation and 3+ comorbidities: DM, CKD, HTN, LBP, HOH, HA  are also affecting patient's functional outcome.   REHAB POTENTIAL: Good  CLINICAL DECISION MAKING: Stable/uncomplicated  EVALUATION COMPLEXITY: Low   SHORT TERM GOALS: Target date: 05/22/2022    Ind with initial HEP Baseline: Goal status: IN PROGRESS  2.  Assess DGI and by 2nd land visit Baseline:   Goal status: MET   LONG TERM GOALS: Target date: 07/03/2022   Ind with advanced HEP and its progression Baseline:  Goal status: INITIAL  2. Improved R knee ROM to 0-125 deg to normalize gait and functional mobility Baseline:  Goal status: INITIAL  3.  Improved LE strength as evidenced by decrease 5XSTS by >= 2.3 sec  Baseline:  Goal status: INITIAL  4.  Able to safely amb 600 feet with knee pain <= 4/10 to improve access to community. Baseline:  Goal status: INITIAL  5.  Decreased pain in the R knee by >=75% with ADLs to improve QOL. Baseline:  Goal status: INITIAL  6.  Pt to demonstrate >= 22/24 on the DGI to decrease fall risk. Baseline: 19/24 Goal status: INITIAL  7.  Improved FOTO to 44 showing functional improvement Baseline: 31 Goal status: INITIAL    PLAN:  PT FREQUENCY: 2x/week  PT DURATION: 8 weeks  PLANNED INTERVENTIONS: Therapeutic exercises, Therapeutic activity, Neuromuscular re-education, Balance training, Gait training, Patient/Family education, Self Care, Joint mobilization, Stair training, Aquatic Therapy, Dry Needling, Electrical stimulation, Spinal mobilization, Cryotherapy, Moist heat, Taping, Ultrasound, and Manual therapy  PLAN FOR NEXT SESSION: Work on R knee ROM and strength, gait and balance,  DN/MT prn   Chasya Zenz, PT 05/12/2022, 12:13 PM

## 2022-05-12 ENCOUNTER — Ambulatory Visit (HOSPITAL_BASED_OUTPATIENT_CLINIC_OR_DEPARTMENT_OTHER)
Admission: RE | Admit: 2022-05-12 | Discharge: 2022-05-12 | Disposition: A | Discharge status: Home / Self Care | Payer: Medicare HMO | Source: Ambulatory Visit | Attending: Family | Admitting: Family

## 2022-05-12 ENCOUNTER — Ambulatory Visit: Payer: Medicare HMO | Admitting: Physical Therapy

## 2022-05-12 ENCOUNTER — Encounter: Payer: Self-pay | Admitting: Physical Therapy

## 2022-05-12 DIAGNOSIS — G8929 Other chronic pain: Secondary | ICD-10-CM

## 2022-05-12 DIAGNOSIS — Z780 Asymptomatic menopausal state: Secondary | ICD-10-CM | POA: Diagnosis present

## 2022-05-12 DIAGNOSIS — R2681 Unsteadiness on feet: Secondary | ICD-10-CM

## 2022-05-12 DIAGNOSIS — M25661 Stiffness of right knee, not elsewhere classified: Secondary | ICD-10-CM

## 2022-05-12 DIAGNOSIS — R252 Cramp and spasm: Secondary | ICD-10-CM

## 2022-05-12 DIAGNOSIS — M25561 Pain in right knee: Secondary | ICD-10-CM | POA: Diagnosis not present

## 2022-05-12 DIAGNOSIS — R2689 Other abnormalities of gait and mobility: Secondary | ICD-10-CM

## 2022-05-12 NOTE — Telephone Encounter (Signed)
Can you addend your last OV note to say:  1. the patient has experienced an inadequate response or adverse effects with non- pharmacologic treatment options (i.e. physical therapy, regular exercise, insoles, knee bracing and weight reduction) Specify the names of the therapy tried/failed)   2. Has the patient experienced an inadequate response or intolerance to a trial of NSAIDs or topical NSAIDs for at least 3 months? Please list names of meds tried/failed.

## 2022-05-13 NOTE — Telephone Encounter (Signed)
Addended 04/28/22 OV note faxed to [id number].

## 2022-05-16 ENCOUNTER — Encounter: Payer: Self-pay | Admitting: Physical Therapy

## 2022-05-16 ENCOUNTER — Ambulatory Visit: Payer: Medicare HMO | Attending: Family Medicine | Admitting: Physical Therapy

## 2022-05-16 DIAGNOSIS — M25661 Stiffness of right knee, not elsewhere classified: Secondary | ICD-10-CM | POA: Diagnosis present

## 2022-05-16 DIAGNOSIS — M25561 Pain in right knee: Secondary | ICD-10-CM | POA: Diagnosis present

## 2022-05-16 DIAGNOSIS — R252 Cramp and spasm: Secondary | ICD-10-CM | POA: Diagnosis present

## 2022-05-16 DIAGNOSIS — R2681 Unsteadiness on feet: Secondary | ICD-10-CM | POA: Diagnosis present

## 2022-05-16 DIAGNOSIS — G8929 Other chronic pain: Secondary | ICD-10-CM | POA: Insufficient documentation

## 2022-05-16 NOTE — Therapy (Signed)
OUTPATIENT PHYSICAL THERAPY TREATMENT   Patient Name: Hayley Case MRN: [id number] DOB:Sep 07, 1950, 72 y.o., female Today's Date: 05/16/2022  END OF SESSION:  PT End of Session - 05/16/22 0845     Visit Number 3    Date for PT Re-Evaluation 07/03/22    Authorization Type [employer] MCR    PT Start Time 0847    PT Stop Time 0932    PT Time Calculation (min) 45 min    Activity Tolerance Patient tolerated treatment well    Behavior During Therapy WFL for tasks assessed/performed             Past Medical History:  Diagnosis Date   CKD (chronic kidney disease)    Diabetes mellitus without complication (HCC)    Hyperlipidemia    Hypertension    Past Surgical History:  Procedure Laterality Date   BREAST BIOPSY     BREAST EXCISIONAL BIOPSY Left    Patient Active Problem List   Diagnosis Date Noted   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 04/21/2022   Primary osteoarthritis of right knee 03/31/2022   Type 2 diabetes mellitus with stage 4 chronic kidney disease, with long-term current use of insulin (HCC) 07/02/2021   Arthritis of hip 04/18/2021   Background diabetic retinopathy (HCC) 04/18/2021   Chronic kidney disease, stage 3b (HCC) 04/18/2021   Dyslipidemia 04/18/2021   Family history of malignant neoplasm of digestive organs 04/18/2021   Gouty arthritis 04/18/2021   Hearing loss 04/18/2021   Hyperglycemia due to type 2 diabetes mellitus (HCC) 04/18/2021   Long term (current) use of insulin (HCC) 04/18/2021   Obstructive sleep apnea (adult) (pediatric) 04/18/2021   Peripheral arterial disease (HCC) 04/18/2021   Personal history of colonic polyps 04/18/2021   Flank pain 10/14/2019   Type 2 diabetes mellitus with diabetic polyneuropathy, with long-term current use of insulin (HCC) 12/01/2018   Type 2 diabetes mellitus with retinopathy, with long-term current use of insulin (HCC) 12/01/2018   Type 2 diabetes mellitus with stage 3b chronic kidney disease, with long-term  current use of insulin (HCC) 11/30/2018   Non-intractable vomiting    Acute hepatitis 07/13/2018   Acute on chronic renal insufficiency 07/12/2018   Diabetes mellitus (HCC) 12/08/2016   Essential hypertension 12/08/2016   Hyperlipidemia associated with type 2 diabetes mellitus (HCC) 12/08/2016    PCP: Olive Bass, FNP   REFERRING PROVIDER: Myra Rude, MD   REFERRING DIAG: M17.11 (ICD-10-CM) - Primary osteoarthritis of right knee   THERAPY DIAG:  Chronic pain of right knee  Stiffness of right knee, not elsewhere classified  Unsteadiness on feet  Rationale for Evaluation and Treatment: Rehabilitation  ONSET DATE: last month or so  SUBJECTIVE:   SUBJECTIVE STATEMENT: Pain in the medial knee, especially if I drive a long distance.  Worry that I'm too close to steering wheel when driving.  Easier to do tandem stance without shoes.    PERTINENT HISTORY: DM, CKD, HTN, LBP, HOH, HA PAIN:  Are you having pain? Yes: NPRS scale: 5/10 Pain location: R knee sometimes goes further  Pain description: intense sharp Aggravating factors: prolonged sitting, standing and walking Relieving factors: Tylenol, ice, reclining   PRECAUTIONS: None  WEIGHT BEARING RESTRICTIONS: No  FALLS:  Has patient fallen in last 6 months? No  LIVING ENVIRONMENT: Lives with: lives with their son Lives in: House/apartment Stairs: No Has following equipment at home: Single point cane  OCCUPATION: retired  PLOF: Independent  PATIENT GOALS: to find a way to  tolerate the pain  NEXT MD VISIT: waiting to schedule gel injections  OBJECTIVE:   DIAGNOSTIC FINDINGS:  XR IMPRESSION: 1. Patellofemoral degenerative changes. 2. Calcified atherosclerotic changes in the femoral vessels.  [place]: degenerative changes in R knee  PATIENT SURVEYS:  FOTO 31 (44 predicted)  COGNITION: Overall cognitive status: Within functional limits for tasks assessed     SENSATION: Neuropathy in B  hands and feet   MUSCLE LENGTH: HS:Bil HS marked Quads: tight R Piriformis: WNL Hip Flexors: WNL  Heelcords: tight B   POSTURE: rounded shoulders and forward head  PALPATION: Palpation: TTP at medial knee, med patella, joint line and Patellar tendon. Also R rectus femoris, VMO and ADDuctors Patellar Mobility: painful med/lat, but WNL   LOWER EXTREMITY ROM:   A/P ROM Right eval Left eval  Knee flexion 114/118 132  Knee extension 0 0   (Blank rows = not tested)  LOWER EXTREMITY MMT: 5/5  LOWER EXTREMITY SPECIAL TESTS:  Step up/Down: TBD  FUNCTIONAL TESTS:  5 times sit to stand: 19.66 sec 6 minute walk test: 894 ft pain 6/10  05/12/22 Dynamic Gait Index: 19/24  05/12/22 Berg Balance Assessment: 51/56 05/12/22  GAIT: Distance walked: 20 Assistive device utilized: None Level of assistance: Complete Independence Comments: No significant gait deviations, but further functional gait assessments shoulder be completed   TODAY'S TREATMENT:                                                                                                                              DATE:   05/16/22 Therapeutic Exercise: to improve strength and mobility.  Demo, verbal and tactile cues throughout for technique. Nustep L4 x 6 min Standing quad stretch Prone knee bends x 10 Prone quad stretch with strap x 1 min bil  Prone leg extension (bent knee) 2 x 5 bil Supine SLR 2 x 5 bil  Bridge 2 x 5 Manual Therapy: to decrease muscle spasm and pain and improve mobility STM/TPR to R medial quad, R patellar tendon, R MCL  Self Care: Education on compression socks, how to put on, where to purchase inexpensive options.   05/12/22   TA:  - 894 ft pain 6/10  DGI - 19/24  BERG 51/56  Therapeutic Exercise: to improve flexibility, strength and mobility.  Verbal and tactile cues throughout for technique Seated HS stretch 2x30 sec Standing quad stretch with foot on mat table, hands on chair back 2x30  sec Gastroc stretch 2x30 sec B Soleus stretch R x 30 sec Supine quad set x 10 5 sec hold SAQ x 10 5 sec hold Supine SLR 2x5 some pain at end  05/08/22 See pt ed and HEP  PATIENT EDUCATION:  Education details: HEP review  Person educated: Patient Education method: Explanation, Demonstration, Verbal cues, and Handouts Education comprehension: verbalized understanding and returned demonstration  HOME EXERCISE PROGRAM: Access Code: [email or web address] URL: [email or web address] Date: 05/16/2022 Prepared by: Harrie Foreman  Exercises - Supine Quadricep  Sets  - 1 x daily - 7 x weekly - 1-2 sets - 15 reps - 5 second hold - Supine Knee Extension Strengthening  - 1 x daily - 7 x weekly - 1-2 sets - 10 reps - 5 sec hold - Supine Active Straight Leg Raise (Mirrored)  - 1 x daily - 7 x weekly - 1-2 sets - 10 reps - Seated Hamstring Stretch  - 2 x daily - 7 x weekly - 1 sets - 3 reps - 30-60 sec hold - Quadricep Stretch with Chair and Counter Support  - 2 x daily - 7 x weekly - 1 sets - 3 reps - 30-60 sec hold - Standing Gastroc Stretch  - 2 x daily - 7 x weekly - 3 reps - 30s hold - Standing Soleus Stretch (Mirrored)  - 2 x daily - 7 x weekly - 3 reps - 30s hold - Prone Knee Flexion  - 1 x daily - 7 x weekly - 2 sets - 10 reps - Prone Quadriceps Stretch with Strap  - 1 x daily - 7 x weekly - 1 sets - 2 reps - 1 month  hold - Prone Hip Extension with Bent Knee  - 1 x daily - 7 x weekly - 2 sets - 10 reps - Supine Bridge  - 1 x daily - 7 x weekly - 2 sets - 10 reps  ASSESSMENT:  CLINICAL IMPRESSION: SHAKEITHIA UMBEL reports having some difficulty with her HEP, getting cramps in her calf with SLR and pulling in groin with quad stretch.  We reviewed and modified her exercises, adding more for hip strengthening as well, as she demonstrates generalized hip weakness and poor endurance, so started with sets of 5.  She tolerated interventions well.  Also discussed compression socks, since she  has a pair but reports they cause pain and hard to get on, so rarely wears, so given some alternatives and demonstrated how to don.  Noted some tightness in medial quads with MT, so dicussed TrDN today, she will consider.  [name] continues to demonstrate potential for improvement and would benefit from continued skilled therapy to address impairments.      .   OBJECTIVE IMPAIRMENTS: decreased activity tolerance, decreased balance, difficulty walking, decreased ROM, decreased strength, increased muscle spasms, impaired flexibility, and pain.   ACTIVITY LIMITATIONS: sitting, standing, squatting, stairs, bed mobility, and locomotion level  PARTICIPATION LIMITATIONS: meal prep, cleaning, driving, and shopping  PERSONAL FACTORS: Transportation and 3+ comorbidities: DM, CKD, HTN, LBP, HOH, HA  are also affecting patient's functional outcome.   REHAB POTENTIAL: Good  CLINICAL DECISION MAKING: Stable/uncomplicated  EVALUATION COMPLEXITY: Low   SHORT TERM GOALS: Target date: 05/22/2022    Ind with initial HEP Baseline: Goal status: IN PROGRESS 05/16/22-reviewed.  2.  Assess DGI and by 2nd land visit Baseline:  Goal status: MET   LONG TERM GOALS: Target date: 07/03/2022   Ind with advanced HEP and its progression Baseline:  Goal status: IN PROGRESS  2. Improved R knee ROM to 0-125 deg to normalize gait and functional mobility Baseline:  Goal status: IN PROGRESS  3.  Improved LE strength as evidenced by decrease 5XSTS by >= 2.3 sec  Baseline:  Goal status: IN PROGRESS  4.  Able to safely amb 600 feet with knee pain <= 4/10 to improve access to community. Baseline:  Goal status: IN PROGRESS  5.  Decreased pain in the R knee by >=75% with ADLs to improve QOL.  Baseline:  Goal status: IN PROGRESS  6.  Pt to demonstrate >= 22/24 on the DGI to decrease fall risk. Baseline: 19/24 Goal status: IN PROGRESS  7.  Improved FOTO to 44 showing functional  improvement Baseline: 31 Goal status: IN PROGRESS    PLAN:  PT FREQUENCY: 2x/week  PT DURATION: 8 weeks  PLANNED INTERVENTIONS: Therapeutic exercises, Therapeutic activity, Neuromuscular re-education, Balance training, Gait training, Patient/Family education, Self Care, Joint mobilization, Stair training, Aquatic Therapy, Dry Needling, Electrical stimulation, Spinal mobilization, Cryotherapy, Moist heat, Taping, Ultrasound, and Manual therapy  PLAN FOR NEXT SESSION: Work on R knee ROM and strength, gait and balance,  DN/MT prn   Jena Gauss, PT, DPT  05/16/2022, 9:43 AM

## 2022-05-21 ENCOUNTER — Ambulatory Visit: Payer: Medicare HMO | Admitting: Physical Therapy

## 2022-05-21 ENCOUNTER — Encounter: Payer: Self-pay | Admitting: Physical Therapy

## 2022-05-21 DIAGNOSIS — R252 Cramp and spasm: Secondary | ICD-10-CM

## 2022-05-21 DIAGNOSIS — M25661 Stiffness of right knee, not elsewhere classified: Secondary | ICD-10-CM

## 2022-05-21 DIAGNOSIS — G8929 Other chronic pain: Secondary | ICD-10-CM

## 2022-05-21 DIAGNOSIS — R2681 Unsteadiness on feet: Secondary | ICD-10-CM

## 2022-05-21 DIAGNOSIS — M25561 Pain in right knee: Secondary | ICD-10-CM | POA: Diagnosis not present

## 2022-05-21 NOTE — Therapy (Signed)
OUTPATIENT PHYSICAL THERAPY TREATMENT   Patient Name: Hayley Case MRN: [id number] DOB:1950/08/09, 72 y.o., female Today's Date: 05/21/2022  END OF SESSION:  PT End of Session - 05/21/22 0850     Visit Number 4    Date for PT Re-Evaluation 07/03/22    Authorization Type [employer] MCR    PT Start Time 0848    PT Stop Time 0930    PT Time Calculation (min) 42 min    Activity Tolerance Patient tolerated treatment well    Behavior During Therapy WFL for tasks assessed/performed             Past Medical History:  Diagnosis Date   CKD (chronic kidney disease)    Diabetes mellitus without complication (HCC)    Hyperlipidemia    Hypertension    Past Surgical History:  Procedure Laterality Date   BREAST BIOPSY     BREAST EXCISIONAL BIOPSY Left    Patient Active Problem List   Diagnosis Date Noted   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 04/21/2022   Primary osteoarthritis of right knee 03/31/2022   Type 2 diabetes mellitus with stage 4 chronic kidney disease, with long-term current use of insulin (HCC) 07/02/2021   Arthritis of hip 04/18/2021   Background diabetic retinopathy (HCC) 04/18/2021   Chronic kidney disease, stage 3b (HCC) 04/18/2021   Dyslipidemia 04/18/2021   Family history of malignant neoplasm of digestive organs 04/18/2021   Gouty arthritis 04/18/2021   Hearing loss 04/18/2021   Hyperglycemia due to type 2 diabetes mellitus (HCC) 04/18/2021   Long term (current) use of insulin (HCC) 04/18/2021   Obstructive sleep apnea (adult) (pediatric) 04/18/2021   Peripheral arterial disease (HCC) 04/18/2021   Personal history of colonic polyps 04/18/2021   Flank pain 10/14/2019   Type 2 diabetes mellitus with diabetic polyneuropathy, with long-term current use of insulin (HCC) 12/01/2018   Type 2 diabetes mellitus with retinopathy, with long-term current use of insulin (HCC) 12/01/2018   Type 2 diabetes mellitus with stage 3b chronic kidney disease, with long-term  current use of insulin (HCC) 11/30/2018   Non-intractable vomiting    Acute hepatitis 07/13/2018   Acute on chronic renal insufficiency 07/12/2018   Diabetes mellitus (HCC) 12/08/2016   Essential hypertension 12/08/2016   Hyperlipidemia associated with type 2 diabetes mellitus (HCC) 12/08/2016    PCP: Olive Bass, FNP   REFERRING PROVIDER: Myra Rude, MD   REFERRING DIAG: M17.11 (ICD-10-CM) - Primary osteoarthritis of right knee   THERAPY DIAG:  Chronic pain of right knee  Stiffness of right knee, not elsewhere classified  Unsteadiness on feet  Cramp and spasm  Rationale for Evaluation and Treatment: Rehabilitation  ONSET DATE: last month or so  SUBJECTIVE:   SUBJECTIVE STATEMENT: Still a little pain in her knee but the exercises help.  Tried adjusting her driving position but likes to be close.  Today is a 5 since she drove herself here.   No cramping this week.   PERTINENT HISTORY: DM, CKD, HTN, LBP, HOH, HA PAIN:  Are you having pain? Yes: NPRS scale: 5/10 Pain location: R knee sometimes goes further  Pain description: intense sharp Aggravating factors: prolonged sitting, standing and walking Relieving factors: Tylenol, ice, reclining   PRECAUTIONS: None  WEIGHT BEARING RESTRICTIONS: No  FALLS:  Has patient fallen in last 6 months? No  LIVING ENVIRONMENT: Lives with: lives with their son Lives in: House/apartment Stairs: No Has following equipment at home: Single point cane  OCCUPATION: retired  PLOF: Independent  PATIENT GOALS: to find a way to tolerate the pain  NEXT MD VISIT: waiting to schedule gel injections  OBJECTIVE:   DIAGNOSTIC FINDINGS:  XR IMPRESSION: 1. Patellofemoral degenerative changes. 2. Calcified atherosclerotic changes in the femoral vessels.  [place]: degenerative changes in R knee  PATIENT SURVEYS:  FOTO 31 (44 predicted)  COGNITION: Overall cognitive status: Within functional limits for tasks  assessed     SENSATION: Neuropathy in B hands and feet   MUSCLE LENGTH: HS:Bil HS marked Quads: tight R Piriformis: WNL Hip Flexors: WNL  Heelcords: tight B   POSTURE: rounded shoulders and forward head  PALPATION: Palpation: TTP at medial knee, med patella, joint line and Patellar tendon. Also R rectus femoris, VMO and ADDuctors Patellar Mobility: painful med/lat, but WNL   LOWER EXTREMITY ROM:   A/P ROM Right eval Left eval  Knee flexion 114/118 132  Knee extension 0 0   (Blank rows = not tested)  LOWER EXTREMITY MMT: 5/5  LOWER EXTREMITY SPECIAL TESTS:  Step up/Down: TBD  FUNCTIONAL TESTS:  5 times sit to stand: 19.66 sec 6 minute walk test: 894 ft pain 6/10  05/12/22 Dynamic Gait Index: 19/24  05/12/22 Berg Balance Assessment: 51/56 05/12/22  GAIT: Distance walked: 20 Assistive device utilized: None Level of assistance: Complete Independence Comments: No significant gait deviations, but further functional gait assessments shoulder be completed   TODAY'S TREATMENT:                                                                                                                              DATE:    05/21/22 Therapeutic Exercise: to improve strength and mobility.  Demo, verbal and tactile cues throughout for technique. Bike L1 x 6 min  Supine SLR 2 x 10 bil  Bridge x 10  Bridge with ball squeeze 2 x 10  Supine clams RTB 2 x 10  Bridge with hip abduction RTB x 10  S/l clams RTB 2 x 10 bil  Manual Therapy: to decrease muscle spasm and pain and improve mobility STM/TPR to R MCL, pes anserine Ultrasound: x 8 min to R medial knee 1 MHz, 1.2 w/cm2 cont to decrease inflammation/pain   05/16/22 Therapeutic Exercise: to improve strength and mobility.  Demo, verbal and tactile cues throughout for technique. Nustep L4 x 6 min Standing quad stretch Prone knee bends x 10 Prone quad stretch with strap x 1 min bil  Prone leg extension (bent knee) 2 x 5 bil Supine SLR 2  x 5 bil  Bridge 2 x 5 Manual Therapy: to decrease muscle spasm and pain and improve mobility STM/TPR to R medial quad, R patellar tendon, R MCL  Self Care: Education on compression socks, how to put on, where to purchase inexpensive options.   05/12/22   TA:  - 894 ft pain 6/10  DGI - 19/24  BERG 51/56  Therapeutic Exercise: to improve flexibility, strength and mobility.  Verbal  and tactile cues throughout for technique Seated HS stretch 2x30 sec Standing quad stretch with foot on mat table, hands on chair back 2x30 sec Gastroc stretch 2x30 sec B Soleus stretch R x 30 sec Supine quad set x 10 5 sec hold SAQ x 10 5 sec hold Supine SLR 2x5 some pain at end  05/08/22 See pt ed and HEP  PATIENT EDUCATION:  Education details: HEP review  Person educated: Patient Education method: Explanation, Demonstration, Verbal cues, and Handouts Education comprehension: verbalized understanding and returned demonstration  HOME EXERCISE PROGRAM: Access Code: [email or web address] URL: [email or web address] Date: 05/21/2022 Prepared by: Harrie Foreman  Exercises - Supine Quadricep Sets  - 1 x daily - 7 x weekly - 1-2 sets - 15 reps - 5 second hold - Supine Knee Extension Strengthening  - 1 x daily - 7 x weekly - 1-2 sets - 10 reps - 5 sec hold - Supine Active Straight Leg Raise (Mirrored)  - 1 x daily - 7 x weekly - 1-2 sets - 10 reps - Seated Hamstring Stretch  - 2 x daily - 7 x weekly - 1 sets - 3 reps - 30-60 sec hold - Quadricep Stretch with Chair and Counter Support  - 2 x daily - 7 x weekly - 1 sets - 3 reps - 30-60 sec hold - Standing Gastroc Stretch  - 2 x daily - 7 x weekly - 3 reps - 30s hold - Standing Soleus Stretch (Mirrored)  - 2 x daily - 7 x weekly - 3 reps - 30s hold - Prone Knee Flexion  - 1 x daily - 7 x weekly - 2 sets - 10 reps - Prone Quadriceps Stretch with Strap  - 1 x daily - 7 x weekly - 1 sets - 2 reps - 1 month  hold - Prone Hip Extension with Bent Knee  -  1 x daily - 7 x weekly - 2 sets - 10 reps - Supine Bridge  - 1 x daily - 7 x weekly - 2 sets - 10 reps - Supine Bridge with Resistance Band  - 1 x daily - 7 x weekly - 2 sets - 10 reps - Supine Bridge with Mini Swiss Ball Between Knees  - 1 x daily - 7 x weekly - 2 sets - 10 reps - Hooklying Clamshell with Resistance  - 1 x daily - 7 x weekly - 2 sets - 10 reps - Clamshell with Resistance  - 1 x daily - 7 x weekly - 2 sets - 10 reps  ASSESSMENT:  CLINICAL IMPRESSION: Wajiha Jocson Dunford reports improved tolerance to exercises and no cramping, and today was able to perform more reps with less fatigue.  Progressed exercises for knee and hip strengthening, continuing to focus on supine and sideling exercises.   Still noted band of tightness over R medial knee near MCL/pes anserine, [place] to area to decrease tightness and improve blood flow.   Discussed TrDN but she is fearful of needles.  [name] continues to demonstrate potential for improvement and would benefit from continued skilled therapy to address impairments.      .   OBJECTIVE IMPAIRMENTS: decreased activity tolerance, decreased balance, difficulty walking, decreased ROM, decreased strength, increased muscle spasms, impaired flexibility, and pain.   ACTIVITY LIMITATIONS: sitting, standing, squatting, stairs, bed mobility, and locomotion level  PARTICIPATION LIMITATIONS: meal prep, cleaning, driving, and shopping  PERSONAL FACTORS: Transportation and 3+ comorbidities: DM, CKD, HTN, LBP, HOH, HA  are also affecting  patient's functional outcome.   REHAB POTENTIAL: Good  CLINICAL DECISION MAKING: Stable/uncomplicated  EVALUATION COMPLEXITY: Low   SHORT TERM GOALS: Target date: 05/22/2022    Ind with initial HEP Baseline: Goal status: MET 05/16/22-reviewed. 05/21/22- met  2.  Assess DGI and by 2nd land visit Baseline:  Goal status: MET   LONG TERM GOALS: Target date: 07/03/2022   Ind with advanced HEP and its  progression Baseline:  Goal status: IN PROGRESS  2. Improved R knee ROM to 0-125 deg to normalize gait and functional mobility Baseline:  Goal status: IN PROGRESS  3.  Improved LE strength as evidenced by decrease 5XSTS by >= 2.3 sec  Baseline:  Goal status: IN PROGRESS  4.  Able to safely amb 600 feet with knee pain <= 4/10 to improve access to community. Baseline:  Goal status: IN PROGRESS  5.  Decreased pain in the R knee by >=75% with ADLs to improve QOL. Baseline:  Goal status: IN PROGRESS  6.  Pt to demonstrate >= 22/24 on the DGI to decrease fall risk. Baseline: 19/24 Goal status: IN PROGRESS  7.  Improved FOTO to 44 showing functional improvement Baseline: 31 Goal status: IN PROGRESS    PLAN:  PT FREQUENCY: 2x/week  PT DURATION: 8 weeks  PLANNED INTERVENTIONS: Therapeutic exercises, Therapeutic activity, Neuromuscular re-education, Balance training, Gait training, Patient/Family education, Self Care, Joint mobilization, Stair training, Aquatic Therapy, Dry Needling, Electrical stimulation, Spinal mobilization, Cryotherapy, Moist heat, Taping, Ultrasound, and Manual therapy  PLAN FOR NEXT SESSION: Work on R knee ROM and strength, gait and balance,  DN/MT prn - afraid of needles.    Jena Gauss, PT, DPT  05/21/2022, 12:10 PM

## 2022-05-23 ENCOUNTER — Ambulatory Visit: Payer: Medicare HMO | Admitting: Psychology

## 2022-05-26 ENCOUNTER — Ambulatory Visit (HOSPITAL_COMMUNITY)
Admission: RE | Admit: 2022-05-26 | Discharge: 2022-05-26 | Disposition: A | Discharge status: Home / Self Care | Payer: Medicare HMO | Source: Ambulatory Visit | Attending: Cardiovascular Disease | Admitting: Cardiovascular Disease

## 2022-05-26 DIAGNOSIS — I6523 Occlusion and stenosis of bilateral carotid arteries: Secondary | ICD-10-CM | POA: Diagnosis present

## 2022-05-28 ENCOUNTER — Ambulatory Visit: Payer: Medicare HMO | Admitting: Physical Therapy

## 2022-05-28 ENCOUNTER — Encounter: Payer: Self-pay | Admitting: Physical Therapy

## 2022-05-28 DIAGNOSIS — M25661 Stiffness of right knee, not elsewhere classified: Secondary | ICD-10-CM

## 2022-05-28 DIAGNOSIS — M25561 Pain in right knee: Secondary | ICD-10-CM | POA: Diagnosis not present

## 2022-05-28 DIAGNOSIS — R2681 Unsteadiness on feet: Secondary | ICD-10-CM

## 2022-05-28 DIAGNOSIS — G8929 Other chronic pain: Secondary | ICD-10-CM

## 2022-05-28 NOTE — Therapy (Signed)
OUTPATIENT PHYSICAL THERAPY TREATMENT   Patient Name: Hayley Case MRN: [id number] DOB:1950/05/13, 72 y.o., female Today's Date: 05/28/2022  END OF SESSION:  PT End of Session - 05/28/22 0852     Visit Number 5    Date for PT Re-Evaluation 07/03/22    Authorization Type [employer] MCR    PT Start Time 0850    PT Stop Time 0930    PT Time Calculation (min) 40 min    Activity Tolerance Patient tolerated treatment well    Behavior During Therapy WFL for tasks assessed/performed             Past Medical History:  Diagnosis Date   CKD (chronic kidney disease)    Diabetes mellitus without complication (HCC)    Hyperlipidemia    Hypertension    Past Surgical History:  Procedure Laterality Date   BREAST BIOPSY     BREAST EXCISIONAL BIOPSY Left    Patient Active Problem List   Diagnosis Date Noted   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 04/21/2022   Primary osteoarthritis of right knee 03/31/2022   Type 2 diabetes mellitus with stage 4 chronic kidney disease, with long-term current use of insulin (HCC) 07/02/2021   Arthritis of hip 04/18/2021   Background diabetic retinopathy (HCC) 04/18/2021   Chronic kidney disease, stage 3b (HCC) 04/18/2021   Dyslipidemia 04/18/2021   Family history of malignant neoplasm of digestive organs 04/18/2021   Gouty arthritis 04/18/2021   Hearing loss 04/18/2021   Hyperglycemia due to type 2 diabetes mellitus (HCC) 04/18/2021   Long term (current) use of insulin (HCC) 04/18/2021   Obstructive sleep apnea (adult) (pediatric) 04/18/2021   Peripheral arterial disease (HCC) 04/18/2021   Personal history of colonic polyps 04/18/2021   Flank pain 10/14/2019   Type 2 diabetes mellitus with diabetic polyneuropathy, with long-term current use of insulin (HCC) 12/01/2018   Type 2 diabetes mellitus with retinopathy, with long-term current use of insulin (HCC) 12/01/2018   Type 2 diabetes mellitus with stage 3b chronic kidney disease, with  long-term current use of insulin (HCC) 11/30/2018   Non-intractable vomiting    Acute hepatitis 07/13/2018   Acute on chronic renal insufficiency 07/12/2018   Diabetes mellitus (HCC) 12/08/2016   Essential hypertension 12/08/2016   Hyperlipidemia associated with type 2 diabetes mellitus (HCC) 12/08/2016    PCP: Olive Bass, FNP   REFERRING PROVIDER: Myra Rude, MD   REFERRING DIAG: M17.11 (ICD-10-CM) - Primary osteoarthritis of right knee   THERAPY DIAG:  Chronic pain of right knee  Stiffness of right knee, not elsewhere classified  Unsteadiness on feet  Rationale for Evaluation and Treatment: Rehabilitation  ONSET DATE: last month or so  SUBJECTIVE:   SUBJECTIVE STATEMENT: Doing a little better, usually the weather brings out the pain but her knee is ok today.    PERTINENT HISTORY: DM, CKD, HTN, LBP, HOH, HA PAIN:  Are you having pain? Yes: NPRS scale: 3-4/10 Pain location: R knee sometimes goes further  Pain description: intense sharp Aggravating factors: prolonged sitting, standing and walking Relieving factors: Tylenol, ice, reclining   PRECAUTIONS: None  WEIGHT BEARING RESTRICTIONS: No  FALLS:  Has patient fallen in last 6 months? No  LIVING ENVIRONMENT: Lives with: lives with their son Lives in: House/apartment Stairs: No Has following equipment at home: Single point cane  OCCUPATION: retired  PLOF: Independent  PATIENT GOALS: to find a way to tolerate the pain  NEXT MD VISIT: waiting to schedule gel injections  OBJECTIVE:  DIAGNOSTIC FINDINGS:  XR IMPRESSION: 1. Patellofemoral degenerative changes. 2. Calcified atherosclerotic changes in the femoral vessels.  [place]: degenerative changes in R knee  PATIENT SURVEYS:  FOTO 31 (44 predicted)  COGNITION: Overall cognitive status: Within functional limits for tasks assessed     SENSATION: Neuropathy in B hands and feet   MUSCLE LENGTH: HS:Bil HS marked Quads: tight  R Piriformis: WNL Hip Flexors: WNL  Heelcords: tight B   POSTURE: rounded shoulders and forward head  PALPATION: Palpation: TTP at medial knee, med patella, joint line and Patellar tendon. Also R rectus femoris, VMO and ADDuctors Patellar Mobility: painful med/lat, but WNL   LOWER EXTREMITY ROM:   A/P ROM Right eval Left eval  Knee flexion 114/118 132  Knee extension 0 0   (Blank rows = not tested)  LOWER EXTREMITY MMT: 5/5  LOWER EXTREMITY SPECIAL TESTS:  Step up/Down: TBD  FUNCTIONAL TESTS:  5 times sit to stand: 19.66 sec 6 minute walk test: 894 ft pain 6/10  05/12/22 Dynamic Gait Index: 19/24  05/12/22 Berg Balance Assessment: 51/56 05/12/22  GAIT: Distance walked: 20 Assistive device utilized: None Level of assistance: Complete Independence Comments: No significant gait deviations, but further functional gait assessments shoulder be completed   TODAY'S TREATMENT:                                                                                                                              DATE:    05/28/22 Therapeutic Exercise: to improve strength and mobility.  Demo, verbal and tactile cues throughout for technique. Nustep L5 x 6 min  Supine SLR 2 x 10 bil  Bridge 2 x 10  Supine clams RTB 2 x 10  S/L clams RTB 2 x 10 bil  Manual Therapy: to decrease muscle spasm and pain and improve mobility STM/TPR to R MCL, pes anserine, skilled palpation and monitoring during dry needling. Trigger Point Dry-Needling  Treatment instructions: Expect mild to moderate muscle soreness. S/S of pneumothorax if dry needled over a lung field, and to seek immediate medical attention should they occur. Patient verbalized understanding of these instructions and education. Patient Consent Given: Yes Education handout provided: Yes Muscles treated: R vastus lateralis Electrical stimulation performed: No Parameters: N/A Treatment response/outcome: Twitch Response Elicited and Palpable  Increase in Muscle Length   05/21/22 Therapeutic Exercise: to improve strength and mobility.  Demo, verbal and tactile cues throughout for technique. Bike L1 x 6 min  Supine SLR 2 x 10 bil  Bridge x 10  Bridge with ball squeeze 2 x 10  Supine clams RTB 2 x 10  Bridge with hip abduction RTB x 10  S/l clams RTB 2 x 10 bil  Manual Therapy: to decrease muscle spasm and pain and improve mobility STM/TPR to R MCL, pes anserine Ultrasound: x 8 min to R medial knee 1 MHz, 1.2 w/cm2 cont to decrease inflammation/pain   05/16/22 Therapeutic Exercise: to improve strength and mobility.  Demo, verbal and tactile cues throughout for technique. Nustep L4 x 6 min Standing quad stretch Prone knee bends x 10 Prone quad stretch with strap x 1 min bil  Prone leg extension (bent knee) 2 x 5 bil Supine SLR 2 x 5 bil  Bridge 2 x 5 Manual Therapy: to decrease muscle spasm and pain and improve mobility STM/TPR to R medial quad, R patellar tendon, R MCL  Self Care: Education on compression socks, how to put on, where to purchase inexpensive options.   05/12/22   TA:  - 894 ft pain 6/10  DGI - 19/24  BERG 51/56  Therapeutic Exercise: to improve flexibility, strength and mobility.  Verbal and tactile cues throughout for technique Seated HS stretch 2x30 sec Standing quad stretch with foot on mat table, hands on chair back 2x30 sec Gastroc stretch 2x30 sec B Soleus stretch R x 30 sec Supine quad set x 10 5 sec hold SAQ x 10 5 sec hold Supine SLR 2x5 some pain at end  05/08/22 See pt ed and HEP  PATIENT EDUCATION:  Education details: HEP review  Person educated: Patient Education method: Explanation, Demonstration, Verbal cues, and Handouts Education comprehension: verbalized understanding and returned demonstration  HOME EXERCISE PROGRAM: Access Code: [email or web address] URL: [email or web address] Date: 05/21/2022 Prepared by: Harrie Foreman  Exercises - Supine Quadricep Sets  - 1 x  daily - 7 x weekly - 1-2 sets - 15 reps - 5 second hold - Supine Knee Extension Strengthening  - 1 x daily - 7 x weekly - 1-2 sets - 10 reps - 5 sec hold - Supine Active Straight Leg Raise (Mirrored)  - 1 x daily - 7 x weekly - 1-2 sets - 10 reps - Seated Hamstring Stretch  - 2 x daily - 7 x weekly - 1 sets - 3 reps - 30-60 sec hold - Quadricep Stretch with Chair and Counter Support  - 2 x daily - 7 x weekly - 1 sets - 3 reps - 30-60 sec hold - Standing Gastroc Stretch  - 2 x daily - 7 x weekly - 3 reps - 30s hold - Standing Soleus Stretch (Mirrored)  - 2 x daily - 7 x weekly - 3 reps - 30s hold - Prone Knee Flexion  - 1 x daily - 7 x weekly - 2 sets - 10 reps - Prone Quadriceps Stretch with Strap  - 1 x daily - 7 x weekly - 1 sets - 2 reps - 1 month  hold - Prone Hip Extension with Bent Knee  - 1 x daily - 7 x weekly - 2 sets - 10 reps - Supine Bridge  - 1 x daily - 7 x weekly - 2 sets - 10 reps - Supine Bridge with Resistance Band  - 1 x daily - 7 x weekly - 2 sets - 10 reps - Supine Bridge with Mini Swiss Ball Between Knees  - 1 x daily - 7 x weekly - 2 sets - 10 reps - Hooklying Clamshell with Resistance  - 1 x daily - 7 x weekly - 2 sets - 10 reps - Clamshell with Resistance  - 1 x daily - 7 x weekly - 2 sets - 10 reps  ASSESSMENT:  CLINICAL IMPRESSION: [name] actually discussed DN with another patient in clinic who had it in knee and had positive results, after that was interested so after education consented to trial, tolerated well with good twitch response and decreased tightness and  banding along pes anserine and no pain at end of session.  Also noted improving strength and tolerance with exercises.  [name] continues to demonstrate potential for improvement and would benefit from continued skilled therapy to address impairments.      OBJECTIVE IMPAIRMENTS: decreased activity tolerance, decreased balance, difficulty walking, decreased ROM, decreased strength,  increased muscle spasms, impaired flexibility, and pain.   ACTIVITY LIMITATIONS: sitting, standing, squatting, stairs, bed mobility, and locomotion level  PARTICIPATION LIMITATIONS: meal prep, cleaning, driving, and shopping  PERSONAL FACTORS: Transportation and 3+ comorbidities: DM, CKD, HTN, LBP, HOH, HA  are also affecting patient's functional outcome.   REHAB POTENTIAL: Good  CLINICAL DECISION MAKING: Stable/uncomplicated  EVALUATION COMPLEXITY: Low   SHORT TERM GOALS: Target date: 05/22/2022    Ind with initial HEP Baseline: Goal status: MET 05/16/22-reviewed. 05/21/22- met  2.  Assess DGI and by 2nd land visit Baseline:  Goal status: MET   LONG TERM GOALS: Target date: 07/03/2022   Ind with advanced HEP and its progression Baseline:  Goal status: IN PROGRESS  2. Improved R knee ROM to 0-125 deg to normalize gait and functional mobility Baseline:  Goal status: IN PROGRESS  3.  Improved LE strength as evidenced by decrease 5XSTS by >= 2.3 sec  Baseline:  Goal status: IN PROGRESS  4.  Able to safely amb 600 feet with knee pain <= 4/10 to improve access to community. Baseline:  Goal status: IN PROGRESS  5.  Decreased pain in the R knee by >=75% with ADLs to improve QOL. Baseline:  Goal status: IN PROGRESS  6.  Pt to demonstrate >= 22/24 on the DGI to decrease fall risk. Baseline: 19/24 Goal status: IN PROGRESS  7.  Improved FOTO to 44 showing functional improvement Baseline: 31 Goal status: IN PROGRESS    PLAN:  PT FREQUENCY: 2x/week  PT DURATION: 8 weeks  PLANNED INTERVENTIONS: Therapeutic exercises, Therapeutic activity, Neuromuscular re-education, Balance training, Gait training, Patient/Family education, Self Care, Joint mobilization, Stair training, Aquatic Therapy, Dry Needling, Electrical stimulation, Spinal mobilization, Cryotherapy, Moist heat, Taping, Ultrasound, and Manual therapy  PLAN FOR NEXT SESSION: Work on R knee ROM and strength,  gait and balance,  DN/MT prn    Jena Gauss, PT, DPT  05/28/2022, 9:31 AM

## 2022-05-28 NOTE — Patient Instructions (Signed)

## 2022-06-04 ENCOUNTER — Ambulatory Visit: Payer: Medicare HMO

## 2022-06-04 DIAGNOSIS — R2681 Unsteadiness on feet: Secondary | ICD-10-CM

## 2022-06-04 DIAGNOSIS — M25661 Stiffness of right knee, not elsewhere classified: Secondary | ICD-10-CM

## 2022-06-04 DIAGNOSIS — M25561 Pain in right knee: Secondary | ICD-10-CM | POA: Diagnosis not present

## 2022-06-04 DIAGNOSIS — G8929 Other chronic pain: Secondary | ICD-10-CM

## 2022-06-04 NOTE — Therapy (Signed)
OUTPATIENT PHYSICAL THERAPY TREATMENT   Patient Name: Hayley Case MRN: [id number] DOB:[birth year], 72 y.o., female Today's Date: 06/04/2022  END OF SESSION:  PT End of Session - 06/04/22 0947     Visit Number 6    Date for PT Re-Evaluation 07/03/22    Authorization Type [employer]ET[employer] MCR    PT Start Time 0849    PT Stop Time 0930    PT Time Calculation (min) 41 min    Activity Tolerance Patient tolerated treatment well    Behavior During Therapy WFL for tasks assessed/performed              Past Medical History:  Diagnosis Date   CKD (chronic kidney disease)    Diabetes mellitus without complication (HCC)    Hyperlipidemia    Hypertension    Past Surgical History:  Procedure Laterality Date   BREAST BIOPSY     BREAST EXCISIONAL BIOPSY Left    Patient Active Problem List   Diagnosis Date Noted   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 04/21/2022   Primary osteoarthritis of right knee 03/31/2022   Type 2 diabetes mellitus with stage 4 chronic kidney disease, with long-term current use of insulin (HCC) 07/02/2021   Arthritis of hip 04/18/2021   Background diabetic retinopathy (HCC) 04/18/2021   Chronic kidney disease, stage 3b (HCC) 04/18/2021   Dyslipidemia 04/18/2021   Family history of malignant neoplasm of digestive organs 04/18/2021   Gouty arthritis 04/18/2021   Hearing loss 04/18/2021   Hyperglycemia due to type 2 diabetes mellitus (HCC) 04/18/2021   Long term (current) use of insulin (HCC) 04/18/2021   Obstructive sleep apnea (adult) (pediatric) 04/18/2021   Peripheral arterial disease (HCC) 04/18/2021   Personal history of colonic polyps 04/18/2021   Flank pain 10/14/2019   Type 2 diabetes mellitus with diabetic polyneuropathy, with long-term current use of insulin (HCC) 12/01/2018   Type 2 diabetes mellitus with retinopathy, with long-term current use of insulin (HCC) 12/01/2018   Type 2 diabetes mellitus with stage 3b chronic kidney disease, with  long-term current use of insulin (HCC) 11/30/2018   Non-intractable vomiting    Acute hepatitis 07/13/2018   Acute on chronic renal insufficiency 07/12/2018   Diabetes mellitus (HCC) 12/08/2016   Essential hypertension 12/08/2016   Hyperlipidemia associated with type 2 diabetes mellitus (HCC) 12/08/2016    PCP: Olive Bass, FNP   REFERRING PROVIDER: Myra Rude, MD   REFERRING DIAG: M17.11 (ICD-10-CM) - Primary osteoarthritis of right knee   THERAPY DIAG:  Chronic pain of right knee  Stiffness of right knee, not elsewhere classified  Unsteadiness on feet  Rationale for Evaluation and Treatment: Rehabilitation  ONSET DATE: last month or so  SUBJECTIVE:   SUBJECTIVE STATEMENT: Pt reports some R knee pain, it seems to always hurt after driving.     PERTINENT HISTORY: DM, CKD, HTN, LBP, HOH, HA PAIN:  Are you having pain? Yes: NPRS scale: 4/10 Pain location: R knee sometimes goes further  Pain description: intense sharp Aggravating factors: prolonged sitting, standing and walking Relieving factors: Tylenol, ice, reclining   PRECAUTIONS: None  WEIGHT BEARING RESTRICTIONS: No  FALLS:  Has patient fallen in last 6 months? No  LIVING ENVIRONMENT: Lives with: lives with their son Lives in: House/apartment Stairs: No Has following equipment at home: Single point cane  OCCUPATION: retired  PLOF: Independent  PATIENT GOALS: to find a way to tolerate the pain  NEXT MD VISIT: waiting to schedule gel injections  OBJECTIVE:  DIAGNOSTIC FINDINGS:  XR IMPRESSION: 1. Patellofemoral degenerative changes. 2. Calcified atherosclerotic changes in the femoral vessels.  [place]: degenerative changes in R knee  PATIENT SURVEYS:  FOTO 31 (44 predicted)  COGNITION: Overall cognitive status: Within functional limits for tasks assessed     SENSATION: Neuropathy in B hands and feet   MUSCLE LENGTH: HS:Bil HS marked Quads: tight R Piriformis: WNL Hip  Flexors: WNL  Heelcords: tight B   POSTURE: rounded shoulders and forward head  PALPATION: Palpation: TTP at medial knee, med patella, joint line and Patellar tendon. Also R rectus femoris, VMO and ADDuctors Patellar Mobility: painful med/lat, but WNL   LOWER EXTREMITY ROM:   A/P ROM Right eval Left eval  Knee flexion 114/118 132  Knee extension 0 0   (Blank rows = not tested)  LOWER EXTREMITY MMT: 5/5  LOWER EXTREMITY SPECIAL TESTS:  Step up/Down: TBD  FUNCTIONAL TESTS:  5 times sit to stand: 19.66 sec 6 minute walk test: 894 ft pain 6/10  05/12/22 Dynamic Gait Index: 19/24  05/12/22 Berg Balance Assessment: 51/56 05/12/22  GAIT: Distance walked: 20 Assistive device utilized: None Level of assistance: Complete Independence Comments: No significant gait deviations, but further functional gait assessments shoulder be completed   TODAY'S TREATMENT:                                                                                                                              DATE:   06/04/22 Therapeutic Exercise: to improve strength and mobility.  Demo, verbal and tactile cues throughout for technique. Nustep L5 x 6 min Step ups RLE 6' x 10  R lateral step ups 6' x 10 Standing march 2# x 10 B Standing hip abduction x 10 2# B Standing hip extension x 10 2# B Bridge with red TB 2x10 S/L clamshells green TB 2x10 Bil  Manual Therapy: to decrease muscle spasm and pain and improve mobility STM/TPR to R MCL, pes anserine   05/28/22 Therapeutic Exercise: to improve strength and mobility.  Demo, verbal and tactile cues throughout for technique. Nustep L5 x 6 min  Supine SLR 2 x 10 bil  Bridge 2 x 10  Supine clams RTB 2 x 10  S/L clams RTB 2 x 10 bil  Manual Therapy: to decrease muscle spasm and pain and improve mobility STM/TPR to R MCL, pes anserine, skilled palpation and monitoring during dry needling. Trigger Point Dry-Needling  Treatment instructions: Expect mild to  moderate muscle soreness. S/S of pneumothorax if dry needled over a lung field, and to seek immediate medical attention should they occur. Patient verbalized understanding of these instructions and education. Patient Consent Given: Yes Education handout provided: Yes Muscles treated: R vastus lateralis Electrical stimulation performed: No Parameters: N/A Treatment response/outcome: Twitch Response Elicited and Palpable Increase in Muscle Length   05/21/22 Therapeutic Exercise: to improve strength and mobility.  Demo, verbal and tactile cues throughout for technique. Bike L1 x 6 min  Supine SLR 2 x 10 bil  Bridge x 10  Bridge with ball squeeze 2 x 10  Supine clams RTB 2 x 10  Bridge with hip abduction RTB x 10  S/l clams RTB 2 x 10 bil  Manual Therapy: to decrease muscle spasm and pain and improve mobility STM/TPR to R MCL, pes anserine Ultrasound: x 8 min to R medial knee 1 MHz, 1.2 w/cm2 cont to decrease inflammation/pain   05/16/22 Therapeutic Exercise: to improve strength and mobility.  Demo, verbal and tactile cues throughout for technique. Nustep L4 x 6 min Standing quad stretch Prone knee bends x 10 Prone quad stretch with strap x 1 min bil  Prone leg extension (bent knee) 2 x 5 bil Supine SLR 2 x 5 bil  Bridge 2 x 5 Manual Therapy: to decrease muscle spasm and pain and improve mobility STM/TPR to R medial quad, R patellar tendon, R MCL  Self Care: Education on compression socks, how to put on, where to purchase inexpensive options.   05/12/22   TA:  - 894 ft pain 6/10  DGI - 19/24  BERG 51/56  Therapeutic Exercise: to improve flexibility, strength and mobility.  Verbal and tactile cues throughout for technique Seated HS stretch 2x30 sec Standing quad stretch with foot on mat table, hands on chair back 2x30 sec Gastroc stretch 2x30 sec B Soleus stretch R x 30 sec Supine quad set x 10 5 sec hold SAQ x 10 5 sec hold Supine SLR 2x5 some pain at end  05/08/22 See pt  ed and HEP  PATIENT EDUCATION:  Education details: HEP review  Person educated: Patient Education method: Explanation, Demonstration, Verbal cues, and Handouts Education comprehension: verbalized understanding and returned demonstration  HOME EXERCISE PROGRAM: Access Code: [email or web address] URL: [email or web address] Date: 05/21/2022 Prepared by: Harrie Foreman  Exercises - Supine Quadricep Sets  - 1 x daily - 7 x weekly - 1-2 sets - 15 reps - 5 second hold - Supine Knee Extension Strengthening  - 1 x daily - 7 x weekly - 1-2 sets - 10 reps - 5 sec hold - Supine Active Straight Leg Raise (Mirrored)  - 1 x daily - 7 x weekly - 1-2 sets - 10 reps - Seated Hamstring Stretch  - 2 x daily - 7 x weekly - 1 sets - 3 reps - 30-60 sec hold - Quadricep Stretch with Chair and Counter Support  - 2 x daily - 7 x weekly - 1 sets - 3 reps - 30-60 sec hold - Standing Gastroc Stretch  - 2 x daily - 7 x weekly - 3 reps - 30s hold - Standing Soleus Stretch (Mirrored)  - 2 x daily - 7 x weekly - 3 reps - 30s hold - Prone Knee Flexion  - 1 x daily - 7 x weekly - 2 sets - 10 reps - Prone Quadriceps Stretch with Strap  - 1 x daily - 7 x weekly - 1 sets - 2 reps - 1 month  hold - Prone Hip Extension with Bent Knee  - 1 x daily - 7 x weekly - 2 sets - 10 reps - Supine Bridge  - 1 x daily - 7 x weekly - 2 sets - 10 reps - Supine Bridge with Resistance Band  - 1 x daily - 7 x weekly - 2 sets - 10 reps - Supine Bridge with Mini Swiss Ball Between Knees  - 1 x daily - 7 x weekly - 2 sets -  10 reps - Hooklying Clamshell with Resistance  - 1 x daily - 7 x weekly - 2 sets - 10 reps - Clamshell with Resistance  - 1 x daily - 7 x weekly - 2 sets - 10 reps  ASSESSMENT:  CLINICAL IMPRESSION: Pt notes good response to DN and interest in trying again. Pt able to complete all interventions. Postural cues were required with standing hip ABD and ext to avoid leaning. Cues also given with the lateral step ups to  avoid twisting her body. Good response to manual therapy. [name] continues to demonstrate potential for improvement and would benefit from continued skilled therapy to address impairments.      OBJECTIVE IMPAIRMENTS: decreased activity tolerance, decreased balance, difficulty walking, decreased ROM, decreased strength, increased muscle spasms, impaired flexibility, and pain.   ACTIVITY LIMITATIONS: sitting, standing, squatting, stairs, bed mobility, and locomotion level  PARTICIPATION LIMITATIONS: meal prep, cleaning, driving, and shopping  PERSONAL FACTORS: Transportation and 3+ comorbidities: DM, CKD, HTN, LBP, HOH, HA  are also affecting patient's functional outcome.   REHAB POTENTIAL: Good  CLINICAL DECISION MAKING: Stable/uncomplicated  EVALUATION COMPLEXITY: Low   SHORT TERM GOALS: Target date: 05/22/2022    Ind with initial HEP Baseline: Goal status: MET 05/16/22-reviewed. 05/21/22- met  2.  Assess DGI and by 2nd land visit Baseline:  Goal status: MET   LONG TERM GOALS: Target date: 07/03/2022   Ind with advanced HEP and its progression Baseline:  Goal status: IN PROGRESS  2. Improved R knee ROM to 0-125 deg to normalize gait and functional mobility Baseline:  Goal status: IN PROGRESS  3.  Improved LE strength as evidenced by decrease 5XSTS by >= 2.3 sec  Baseline:  Goal status: IN PROGRESS  4.  Able to safely amb 600 feet with knee pain <= 4/10 to improve access to community. Baseline:  Goal status: IN PROGRESS  5.  Decreased pain in the R knee by >=75% with ADLs to improve QOL. Baseline:  Goal status: IN PROGRESS  6.  Pt to demonstrate >= 22/24 on the DGI to decrease fall risk. Baseline: 19/24 Goal status: IN PROGRESS  7.  Improved FOTO to 44 showing functional improvement Baseline: 31 Goal status: IN PROGRESS    PLAN:  PT FREQUENCY: 2x/week  PT DURATION: 8 weeks  PLANNED INTERVENTIONS: Therapeutic exercises, Therapeutic activity,  Neuromuscular re-education, Balance training, Gait training, Patient/Family education, Self Care, Joint mobilization, Stair training, Aquatic Therapy, Dry Needling, Electrical stimulation, Spinal mobilization, Cryotherapy, Moist heat, Taping, Ultrasound, and Manual therapy  PLAN FOR NEXT SESSION: Work on R knee ROM and strength, gait and balance,  DN/MT prn    Darleene Cleaver, PTA 06/04/2022, 9:48 AM

## 2022-06-06 ENCOUNTER — Ambulatory Visit: Payer: Medicare HMO | Admitting: Physical Therapy

## 2022-06-06 ENCOUNTER — Encounter: Payer: Self-pay | Admitting: Physical Therapy

## 2022-06-06 DIAGNOSIS — M25661 Stiffness of right knee, not elsewhere classified: Secondary | ICD-10-CM

## 2022-06-06 DIAGNOSIS — G8929 Other chronic pain: Secondary | ICD-10-CM

## 2022-06-06 DIAGNOSIS — R252 Cramp and spasm: Secondary | ICD-10-CM

## 2022-06-06 DIAGNOSIS — R2681 Unsteadiness on feet: Secondary | ICD-10-CM

## 2022-06-06 DIAGNOSIS — M25561 Pain in right knee: Secondary | ICD-10-CM | POA: Diagnosis not present

## 2022-06-06 NOTE — Therapy (Signed)
OUTPATIENT PHYSICAL THERAPY TREATMENT   Patient Name: Hayley Case MRN: [id number] DOB:Jun 12, 1950, 72 y.o., female Today's Date: 06/06/2022  END OF SESSION:  PT End of Session - 06/06/22 0936     Visit Number 7    Date for PT Re-Evaluation 07/03/22    Authorization Type [employer] MCR    PT Start Time 0934    PT Stop Time 1014    PT Time Calculation (min) 40 min    Activity Tolerance Patient tolerated treatment well    Behavior During Therapy WFL for tasks assessed/performed              Past Medical History:  Diagnosis Date   CKD (chronic kidney disease)    Diabetes mellitus without complication (HCC)    Hyperlipidemia    Hypertension    Past Surgical History:  Procedure Laterality Date   BREAST BIOPSY     BREAST EXCISIONAL BIOPSY Left    Patient Active Problem List   Diagnosis Date Noted   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 04/21/2022   Primary osteoarthritis of right knee 03/31/2022   Type 2 diabetes mellitus with stage 4 chronic kidney disease, with long-term current use of insulin (HCC) 07/02/2021   Arthritis of hip 04/18/2021   Background diabetic retinopathy (HCC) 04/18/2021   Chronic kidney disease, stage 3b (HCC) 04/18/2021   Dyslipidemia 04/18/2021   Family history of malignant neoplasm of digestive organs 04/18/2021   Gouty arthritis 04/18/2021   Hearing loss 04/18/2021   Hyperglycemia due to type 2 diabetes mellitus (HCC) 04/18/2021   Long term (current) use of insulin (HCC) 04/18/2021   Obstructive sleep apnea (adult) (pediatric) 04/18/2021   Peripheral arterial disease (HCC) 04/18/2021   Personal history of colonic polyps 04/18/2021   Flank pain 10/14/2019   Type 2 diabetes mellitus with diabetic polyneuropathy, with long-term current use of insulin (HCC) 12/01/2018   Type 2 diabetes mellitus with retinopathy, with long-term current use of insulin (HCC) 12/01/2018   Type 2 diabetes mellitus with stage 3b chronic kidney disease, with  long-term current use of insulin (HCC) 11/30/2018   Non-intractable vomiting    Acute hepatitis 07/13/2018   Acute on chronic renal insufficiency 07/12/2018   Diabetes mellitus (HCC) 12/08/2016   Essential hypertension 12/08/2016   Hyperlipidemia associated with type 2 diabetes mellitus (HCC) 12/08/2016    PCP: Olive Bass, FNP   REFERRING PROVIDER: Myra Rude, MD   REFERRING DIAG: M17.11 (ICD-10-CM) - Primary osteoarthritis of right knee   THERAPY DIAG:  Chronic pain of right knee  Stiffness of right knee, not elsewhere classified  Unsteadiness on feet  Cramp and spasm  Rationale for Evaluation and Treatment: Rehabilitation  ONSET DATE: last month or so  SUBJECTIVE:   SUBJECTIVE STATEMENT: Pt reports some R knee pain, it seems to always hurt after driving.  Thinks she pushes too hard on the pedal since she doesn't have good feeling in the bottom of her foot.  Took a tylenol this morning.   Feels better in morning when she does a little exercise and takes a hot shower.    PERTINENT HISTORY: DM, CKD, HTN, LBP, HOH, HA PAIN:  Are you having pain? Yes: NPRS scale: 3-4/10 Pain location: R knee sometimes goes further  Pain description: intense sharp Aggravating factors: prolonged sitting, standing and walking Relieving factors: Tylenol, ice, reclining   PRECAUTIONS: None  WEIGHT BEARING RESTRICTIONS: No  FALLS:  Has patient fallen in last 6 months? No  LIVING ENVIRONMENT: Lives with:  lives with their son Lives in: House/apartment Stairs: No Has following equipment at home: Single point cane  OCCUPATION: retired  PLOF: Independent  PATIENT GOALS: to find a way to tolerate the pain  NEXT MD VISIT: waiting to schedule gel injections  OBJECTIVE:   DIAGNOSTIC FINDINGS:  XR IMPRESSION: 1. Patellofemoral degenerative changes. 2. Calcified atherosclerotic changes in the femoral vessels.  [place]: degenerative changes in R knee  PATIENT  SURVEYS:  FOTO 31 (44 predicted)  COGNITION: Overall cognitive status: Within functional limits for tasks assessed     SENSATION: Neuropathy in B hands and feet   MUSCLE LENGTH: HS:Bil HS marked Quads: tight R Piriformis: WNL Hip Flexors: WNL  Heelcords: tight B   POSTURE: rounded shoulders and forward head  PALPATION: Palpation: TTP at medial knee, med patella, joint line and Patellar tendon. Also R rectus femoris, VMO and ADDuctors Patellar Mobility: painful med/lat, but WNL   LOWER EXTREMITY ROM:   A/P ROM Right eval Left eval  Knee flexion 114/118 132  Knee extension 0 0   (Blank rows = not tested)  LOWER EXTREMITY MMT: 5/5  LOWER EXTREMITY SPECIAL TESTS:  Step up/Down: TBD  FUNCTIONAL TESTS:  5 times sit to stand: 19.66 sec 6 minute walk test: 894 ft pain 6/10  05/12/22 Dynamic Gait Index: 19/24  05/12/22 Berg Balance Assessment: 51/56 05/12/22  GAIT: Distance walked: 20 Assistive device utilized: None Level of assistance: Complete Independence Comments: No significant gait deviations, but further functional gait assessments shoulder be completed   TODAY'S TREATMENT:                                                                                                                              DATE:   06/06/2022 Therapeutic Exercise: to improve strength and mobility.  Demo, verbal and tactile cues throughout for technique. Bike L1 x 7 min  Bridges 2 x 10 SLR 2 x 10 bil  Manual Therapy: to decrease muscle spasm and pain and improve mobility STM/TPR to R quads, adductors, LCL, sartorius, patellar mobs, skilled palpation and monitoring during dry needling. Trigger Point Dry-Needling  Treatment instructions: Expect mild to moderate muscle soreness. S/S of pneumothorax if dry needled over a lung field, and to seek immediate medical attention should they occur. Patient verbalized understanding of these instructions and education. Patient Consent Given:  Yes Education handout provided: Previously provided Muscles treated: R adductor longus Electrical stimulation performed: No Parameters: N/A Treatment response/outcome: Twitch Response Elicited and Palpable Increase in Muscle Length   06/04/22 Therapeutic Exercise: to improve strength and mobility.  Demo, verbal and tactile cues throughout for technique. Nustep L5 x 6 min Step ups RLE 6' x 10  R lateral step ups 6' x 10 Standing march 2# x 10 B Standing hip abduction x 10 2# B Standing hip extension x 10 2# B Bridge with red TB 2x10 S/L clamshells green TB 2x10 Bil  Manual Therapy: to decrease muscle spasm and pain  and improve mobility STM/TPR to R MCL, pes anserine   05/28/22 Therapeutic Exercise: to improve strength and mobility.  Demo, verbal and tactile cues throughout for technique. Nustep L5 x 6 min  Supine SLR 2 x 10 bil  Bridge 2 x 10  Supine clams RTB 2 x 10  S/L clams RTB 2 x 10 bil  Manual Therapy: to decrease muscle spasm and pain and improve mobility STM/TPR to R MCL, pes anserine, skilled palpation and monitoring during dry needling. Trigger Point Dry-Needling  Treatment instructions: Expect mild to moderate muscle soreness. S/S of pneumothorax if dry needled over a lung field, and to seek immediate medical attention should they occur. Patient verbalized understanding of these instructions and education. Patient Consent Given: Yes Education handout provided: Yes Muscles treated: R vastus lateralis Electrical stimulation performed: No Parameters: N/A Treatment response/outcome: Twitch Response Elicited and Palpable Increase in Muscle Length   05/21/22 Therapeutic Exercise: to improve strength and mobility.  Demo, verbal and tactile cues throughout for technique. Bike L1 x 6 min  Supine SLR 2 x 10 bil  Bridge x 10  Bridge with ball squeeze 2 x 10  Supine clams RTB 2 x 10  Bridge with hip abduction RTB x 10  S/l clams RTB 2 x 10 bil  Manual Therapy: to decrease  muscle spasm and pain and improve mobility STM/TPR to R MCL, pes anserine Ultrasound: x 8 min to R medial knee 1 MHz, 1.2 w/cm2 cont to decrease inflammation/pain   PATIENT EDUCATION:  Education details: HEP review  Person educated: Patient Education method: Programmer, multimedia, Facilities manager, Verbal cues, and Handouts Education comprehension: verbalized understanding and returned demonstration  HOME EXERCISE PROGRAM: Access Code: [email or web address] URL: [email or web address] Date: 05/21/2022 Prepared by: Harrie Foreman  Exercises - Supine Quadricep Sets  - 1 x daily - 7 x weekly - 1-2 sets - 15 reps - 5 second hold - Supine Knee Extension Strengthening  - 1 x daily - 7 x weekly - 1-2 sets - 10 reps - 5 sec hold - Supine Active Straight Leg Raise (Mirrored)  - 1 x daily - 7 x weekly - 1-2 sets - 10 reps - Seated Hamstring Stretch  - 2 x daily - 7 x weekly - 1 sets - 3 reps - 30-60 sec hold - Quadricep Stretch with Chair and Counter Support  - 2 x daily - 7 x weekly - 1 sets - 3 reps - 30-60 sec hold - Standing Gastroc Stretch  - 2 x daily - 7 x weekly - 3 reps - 30s hold - Standing Soleus Stretch (Mirrored)  - 2 x daily - 7 x weekly - 3 reps - 30s hold - Prone Knee Flexion  - 1 x daily - 7 x weekly - 2 sets - 10 reps - Prone Quadriceps Stretch with Strap  - 1 x daily - 7 x weekly - 1 sets - 2 reps - 1 month  hold - Prone Hip Extension with Bent Knee  - 1 x daily - 7 x weekly - 2 sets - 10 reps - Supine Bridge  - 1 x daily - 7 x weekly - 2 sets - 10 reps - Supine Bridge with Resistance Band  - 1 x daily - 7 x weekly - 2 sets - 10 reps - Supine Bridge with Mini Swiss Ball Between Knees  - 1 x daily - 7 x weekly - 2 sets - 10 reps - Hooklying Clamshell with Resistance  - 1 x daily - 7  x weekly - 2 sets - 10 reps - Clamshell with Resistance  - 1 x daily - 7 x weekly - 2 sets - 10 reps  ASSESSMENT:  CLINICAL IMPRESSION: ROSANN TRUEBA continues to report R medial knee pain,  especially after driving.  Since she did report benefit after TrDN, consented to another trial today, noted trigger points in adductors and tenderness still in pes anserine, so focused manual therapy here, with decreased pain noted following.  Continues to demonstrate improving strength with bridges and improving tolerance to HEP.   [name] continues to demonstrate potential for improvement and would benefit from continued skilled therapy to address impairments.      OBJECTIVE IMPAIRMENTS: decreased activity tolerance, decreased balance, difficulty walking, decreased ROM, decreased strength, increased muscle spasms, impaired flexibility, and pain.   ACTIVITY LIMITATIONS: sitting, standing, squatting, stairs, bed mobility, and locomotion level  PARTICIPATION LIMITATIONS: meal prep, cleaning, driving, and shopping  PERSONAL FACTORS: Transportation and 3+ comorbidities: DM, CKD, HTN, LBP, HOH, HA  are also affecting patient's functional outcome.   REHAB POTENTIAL: Good  CLINICAL DECISION MAKING: Stable/uncomplicated  EVALUATION COMPLEXITY: Low   SHORT TERM GOALS: Target date: 05/22/2022    Ind with initial HEP Baseline: Goal status: MET 05/16/22-reviewed. 05/21/22- met  2.  Assess DGI and by 2nd land visit Baseline:  Goal status: MET   LONG TERM GOALS: Target date: 07/03/2022   Ind with advanced HEP and its progression Baseline:  Goal status: IN PROGRESS  2. Improved R knee ROM to 0-125 deg to normalize gait and functional mobility Baseline:  Goal status: IN PROGRESS  3.  Improved LE strength as evidenced by decrease 5XSTS by >= 2.3 sec  Baseline:  Goal status: IN PROGRESS  4.  Able to safely amb 600 feet with knee pain <= 4/10 to improve access to community. Baseline: 6/10 Goal status: IN PROGRESS  5.  Decreased pain in the R knee by >=75% with ADLs to improve QOL. Baseline:  Goal status: IN PROGRESS  6.  Pt to demonstrate >= 22/24 on the DGI to decrease fall  risk. Baseline: 19/24 Goal status: IN PROGRESS  7.  Improved FOTO to 44 showing functional improvement Baseline: 31 Goal status: IN PROGRESS    PLAN:  PT FREQUENCY: 2x/week  PT DURATION: 8 weeks  PLANNED INTERVENTIONS: Therapeutic exercises, Therapeutic activity, Neuromuscular re-education, Balance training, Gait training, Patient/Family education, Self Care, Joint mobilization, Stair training, Aquatic Therapy, Dry Needling, Electrical stimulation, Spinal mobilization, Cryotherapy, Moist heat, Taping, Ultrasound, and Manual therapy  PLAN FOR NEXT SESSION: Work on R knee ROM and strength, gait and balance,  DN/MT prn    Jena Gauss, PT 06/06/2022, 11:22 AM

## 2022-06-11 ENCOUNTER — Encounter (HOSPITAL_BASED_OUTPATIENT_CLINIC_OR_DEPARTMENT_OTHER): Payer: Self-pay | Admitting: Physical Therapy

## 2022-06-11 ENCOUNTER — Other Ambulatory Visit: Payer: Self-pay | Admitting: Family Medicine

## 2022-06-11 ENCOUNTER — Ambulatory Visit (HOSPITAL_BASED_OUTPATIENT_CLINIC_OR_DEPARTMENT_OTHER): Payer: Medicare HMO | Attending: Family Medicine | Admitting: Physical Therapy

## 2022-06-11 DIAGNOSIS — G8929 Other chronic pain: Secondary | ICD-10-CM | POA: Diagnosis present

## 2022-06-11 DIAGNOSIS — R2681 Unsteadiness on feet: Secondary | ICD-10-CM | POA: Insufficient documentation

## 2022-06-11 DIAGNOSIS — M25661 Stiffness of right knee, not elsewhere classified: Secondary | ICD-10-CM | POA: Insufficient documentation

## 2022-06-11 DIAGNOSIS — M25561 Pain in right knee: Secondary | ICD-10-CM | POA: Insufficient documentation

## 2022-06-11 NOTE — Therapy (Signed)
OUTPATIENT PHYSICAL THERAPY TREATMENT   Patient Name: Hayley Case MRN: [id number] DOB:03-11-1950, 72 y.o., female Today's Date: 06/11/2022  END OF SESSION:  PT End of Session - 06/11/22 1246     Visit Number 8    Date for PT Re-Evaluation 07/03/22    Authorization Type [employer]ET[employer] MCR    PT Start Time 0815    PT Stop Time 0855    PT Time Calculation (min) 40 min    Activity Tolerance Patient tolerated treatment well    Behavior During Therapy WFL for tasks assessed/performed               Past Medical History:  Diagnosis Date   CKD (chronic kidney disease)    Diabetes mellitus without complication (HCC)    Hyperlipidemia    Hypertension    Past Surgical History:  Procedure Laterality Date   BREAST BIOPSY     BREAST EXCISIONAL BIOPSY Left    Patient Active Problem List   Diagnosis Date Noted   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 04/21/2022   Primary osteoarthritis of right knee 03/31/2022   Type 2 diabetes mellitus with stage 4 chronic kidney disease, with long-term current use of insulin (HCC) 07/02/2021   Arthritis of hip 04/18/2021   Background diabetic retinopathy (HCC) 04/18/2021   Chronic kidney disease, stage 3b (HCC) 04/18/2021   Dyslipidemia 04/18/2021   Family history of malignant neoplasm of digestive organs 04/18/2021   Gouty arthritis 04/18/2021   Hearing loss 04/18/2021   Hyperglycemia due to type 2 diabetes mellitus (HCC) 04/18/2021   Long term (current) use of insulin (HCC) 04/18/2021   Obstructive sleep apnea (adult) (pediatric) 04/18/2021   Peripheral arterial disease (HCC) 04/18/2021   Personal history of colonic polyps 04/18/2021   Flank pain 10/14/2019   Type 2 diabetes mellitus with diabetic polyneuropathy, with long-term current use of insulin (HCC) 12/01/2018   Type 2 diabetes mellitus with retinopathy, with long-term current use of insulin (HCC) 12/01/2018   Type 2 diabetes mellitus with stage 3b chronic kidney disease, with  long-term current use of insulin (HCC) 11/30/2018   Non-intractable vomiting    Acute hepatitis 07/13/2018   Acute on chronic renal insufficiency 07/12/2018   Diabetes mellitus (HCC) 12/08/2016   Essential hypertension 12/08/2016   Hyperlipidemia associated with type 2 diabetes mellitus (HCC) 12/08/2016    PCP: Olive Bass, FNP   REFERRING PROVIDER: Myra Rude, MD   REFERRING DIAG: M17.11 (ICD-10-CM) - Primary osteoarthritis of right knee   THERAPY DIAG:  Chronic pain of right knee  Stiffness of right knee, not elsewhere classified  Unsteadiness on feet  Rationale for Evaluation and Treatment: Rehabilitation  ONSET DATE: last month or so  SUBJECTIVE:   SUBJECTIVE STATEMENT: Pt reports continued Rt knee pain.    PERTINENT HISTORY: DM, CKD, HTN, LBP, HOH, HA PAIN:  Are you having pain? Yes: NPRS scale: 3-4/10 Pain location: R knee ant/medial  Pain description: intense sharp Aggravating factors: prolonged sitting, standing and walking Relieving factors: Tylenol, ice, reclining   PRECAUTIONS: None  WEIGHT BEARING RESTRICTIONS: No  FALLS:  Has patient fallen in last 6 months? No  LIVING ENVIRONMENT: Lives with: lives with their son Lives in: House/apartment Stairs: No Has following equipment at home: Single point cane  OCCUPATION: retired  PLOF: Independent  PATIENT GOALS: to find a way to tolerate the pain  NEXT MD VISIT: waiting to schedule gel injections  OBJECTIVE:   DIAGNOSTIC FINDINGS:  XR IMPRESSION: 1. Patellofemoral degenerative changes.  2. Calcified atherosclerotic changes in the femoral vessels.  [place]: degenerative changes in R knee  PATIENT SURVEYS:  FOTO 31 (44 predicted)  COGNITION: Overall cognitive status: Within functional limits for tasks assessed     SENSATION: Neuropathy in B hands and feet   MUSCLE LENGTH: HS:Bil HS marked Quads: tight R Piriformis: WNL Hip Flexors: WNL  Heelcords: tight  B   POSTURE: rounded shoulders and forward head  PALPATION: Palpation: TTP at medial knee, med patella, joint line and Patellar tendon. Also R rectus femoris, VMO and ADDuctors Patellar Mobility: painful med/lat, but WNL   LOWER EXTREMITY ROM:   A/P ROM Right eval Left eval  Knee flexion 114/118 132  Knee extension 0 0   (Blank rows = not tested)  LOWER EXTREMITY MMT: 5/5  LOWER EXTREMITY SPECIAL TESTS:  Step up/Down: TBD  FUNCTIONAL TESTS:  5 times sit to stand: 19.66 sec 6 minute walk test: 894 ft pain 6/10  05/12/22 Dynamic Gait Index: 19/24  05/12/22 Berg Balance Assessment: 51/56 05/12/22  GAIT: Distance walked: 20 Assistive device utilized: None Level of assistance: Complete Independence Comments: No significant gait deviations, but further functional gait assessments shoulder be completed   TODAY'S TREATMENT:                                                                                                                              DATE:   06/11/22 Pt seen for aquatic therapy today.  Treatment took place in water 3.5-4.75 ft in depth at the [facility] pool. Temp of water was 91.  Pt entered/exited the pool via stairs independently with bilat rail in step-to pattern. * unsupported walking forward  * holding yellow hand floats - walking backwards, side stepping, marching * side stepping with arm abdct/ addct * holding wall:  hip abdct/ addct x 10; leg swings into hip flex/ext (straight knee);  heel/toe raises x 10;  single leg clams x 10 each LE;  relaxed squats with vertical trunk x 5; side lunge for adductor stretch (min stretch felt) * quad stretch with foot on 2nd step, 15s x 2, each LE  * back against wall:  3 way LE stretch (ITB, adductor, hamstring) 15 sec each position, each LE * when dried off: applied reg Rock tape to medial Rt knee at joint line (superior to inferior, curving in towards tibial tuberosity) with knee in flexed position and  perpendicular strip at joint line.  Instructed pt on safe tape removal and wear time; pt verbalized understanding  Pt requires the buoyancy and hydrostatic pressure of water for support, and to offload joints by unweighting joint load by at least 50 % in navel deep water and by at least 75-80% in chest to neck deep water.  Viscosity of the water is needed for resistance of strengthening. Water current perturbations provides challenge to standing balance requiring increased core activation.     06/06/2022  Therapeutic Exercise: to improve strength and mobility.  Demo, verbal and tactile cues throughout for technique. Bike L1 x 7 min  Bridges 2 x 10 SLR 2 x 10 bil  Manual Therapy: to decrease muscle spasm and pain and improve mobility STM/TPR to R quads, adductors, LCL, sartorius, patellar mobs, skilled palpation and monitoring during dry needling. Trigger Point Dry-Needling  Treatment instructions: Expect mild to moderate muscle soreness. S/S of pneumothorax if dry needled over a lung field, and to seek immediate medical attention should they occur. Patient verbalized understanding of these instructions and education. Patient Consent Given: Yes Education handout provided: Previously provided Muscles treated: R adductor longus Electrical stimulation performed: No Parameters: N/A Treatment response/outcome: Twitch Response Elicited and Palpable Increase in Muscle Length   06/04/22 Therapeutic Exercise: to improve strength and mobility.  Demo, verbal and tactile cues throughout for technique. Nustep L5 x 6 min Step ups RLE 6' x 10  R lateral step ups 6' x 10 Standing march 2# x 10 B Standing hip abduction x 10 2# B Standing hip extension x 10 2# B Bridge with red TB 2x10 S/L clamshells green TB 2x10 Bil  Manual Therapy: to decrease muscle spasm and pain and improve mobility STM/TPR to R MCL, pes anserine   05/28/22 Therapeutic Exercise: to improve strength and mobility.  Demo, verbal  and tactile cues throughout for technique. Nustep L5 x 6 min  Supine SLR 2 x 10 bil  Bridge 2 x 10  Supine clams RTB 2 x 10  S/L clams RTB 2 x 10 bil  Manual Therapy: to decrease muscle spasm and pain and improve mobility STM/TPR to R MCL, pes anserine, skilled palpation and monitoring during dry needling. Trigger Point Dry-Needling  Treatment instructions: Expect mild to moderate muscle soreness. S/S of pneumothorax if dry needled over a lung field, and to seek immediate medical attention should they occur. Patient verbalized understanding of these instructions and education. Patient Consent Given: Yes Education handout provided: Yes Muscles treated: R vastus lateralis Electrical stimulation performed: No Parameters: N/A Treatment response/outcome: Twitch Response Elicited and Palpable Increase in Muscle Length   05/21/22 Therapeutic Exercise: to improve strength and mobility.  Demo, verbal and tactile cues throughout for technique. Bike L1 x 6 min  Supine SLR 2 x 10 bil  Bridge x 10  Bridge with ball squeeze 2 x 10  Supine clams RTB 2 x 10  Bridge with hip abduction RTB x 10  S/l clams RTB 2 x 10 bil  Manual Therapy: to decrease muscle spasm and pain and improve mobility STM/TPR to R MCL, pes anserine Ultrasound: x 8 min to R medial knee 1 MHz, 1.2 w/cm2 cont to decrease inflammation/pain   PATIENT EDUCATION:  Education details: Ktape rationale, safe removal,  aquatic therapy intro  Person educated: Patient Education method: Programmer, multimedia, Demonstration, Verbal cues Education comprehension: verbalized understanding and returned demonstration  HOME EXERCISE PROGRAM: Access Code: [email or web address] URL: [email or web address] Date: 05/21/2022 Prepared by: Harrie Foreman  Exercises - Supine Quadricep Sets  - 1 x daily - 7 x weekly - 1-2 sets - 15 reps - 5 second hold - Supine Knee Extension Strengthening  - 1 x daily - 7 x weekly - 1-2 sets - 10 reps - 5 sec hold -  Supine Active Straight Leg Raise (Mirrored)  - 1 x daily - 7 x weekly - 1-2 sets - 10 reps - Seated Hamstring Stretch  - 2 x daily - 7 x weekly - 1 sets - 3 reps - 30-60 sec hold - Quadricep Stretch with  Chair and Counter Support  - 2 x daily - 7 x weekly - 1 sets - 3 reps - 30-60 sec hold - Standing Gastroc Stretch  - 2 x daily - 7 x weekly - 3 reps - 30s hold - Standing Soleus Stretch (Mirrored)  - 2 x daily - 7 x weekly - 3 reps - 30s hold - Prone Knee Flexion  - 1 x daily - 7 x weekly - 2 sets - 10 reps - Prone Quadriceps Stretch with Strap  - 1 x daily - 7 x weekly - 1 sets - 2 reps - 1 month  hold - Prone Hip Extension with Bent Knee  - 1 x daily - 7 x weekly - 2 sets - 10 reps - Supine Bridge  - 1 x daily - 7 x weekly - 2 sets - 10 reps - Supine Bridge with Resistance Band  - 1 x daily - 7 x weekly - 2 sets - 10 reps - Supine Bridge with Mini Swiss Ball Between Knees  - 1 x daily - 7 x weekly - 2 sets - 10 reps - Hooklying Clamshell with Resistance  - 1 x daily - 7 x weekly - 2 sets - 10 reps - Clamshell with Resistance  - 1 x daily - 7 x weekly - 2 sets - 10 reps  ASSESSMENT:  CLINICAL IMPRESSION: Pt had good tolerance of aquatic exercises, reporting gradual reduction of Rt knee pain to 1/10 at end of session.  Rt adductors/hamstrings tight.  Added standing supported stretches, but advised that this version is for water. Pt to continue with land exercises as seen in HEP. Pt  continues to demonstrate potential for improvement and would benefit from continued skilled therapy to address impairments.      OBJECTIVE IMPAIRMENTS: decreased activity tolerance, decreased balance, difficulty walking, decreased ROM, decreased strength, increased muscle spasms, impaired flexibility, and pain.   ACTIVITY LIMITATIONS: sitting, standing, squatting, stairs, bed mobility, and locomotion level  PARTICIPATION LIMITATIONS: meal prep, cleaning, driving, and shopping  PERSONAL FACTORS: Transportation  and 3+ comorbidities: DM, CKD, HTN, LBP, HOH, HA  are also affecting patient's functional outcome.   REHAB POTENTIAL: Good  CLINICAL DECISION MAKING: Stable/uncomplicated  EVALUATION COMPLEXITY: Low   SHORT TERM GOALS: Target date: 05/22/2022    Ind with initial HEP Baseline: Goal status: MET 05/16/22-reviewed. 05/21/22- met  2.  Assess DGI and by 2nd land visit Baseline:  Goal status: MET   LONG TERM GOALS: Target date: 07/03/2022   Ind with advanced HEP and its progression Baseline:  Goal status: IN PROGRESS  2. Improved R knee ROM to 0-125 deg to normalize gait and functional mobility Baseline:  Goal status: IN PROGRESS  3.  Improved LE strength as evidenced by decrease 5XSTS by >= 2.3 sec  Baseline:  Goal status: IN PROGRESS  4.  Able to safely amb 600 feet with knee pain <= 4/10 to improve access to community. Baseline: 6/10 Goal status: IN PROGRESS  5.  Decreased pain in the R knee by >=75% with ADLs to improve QOL. Baseline:  Goal status: IN PROGRESS  6.  Pt to demonstrate >= 22/24 on the DGI to decrease fall risk. Baseline: 19/24 Goal status: IN PROGRESS  7.  Improved FOTO to 44 showing functional improvement Baseline: 31 Goal status: IN PROGRESS    PLAN:  PT FREQUENCY: 2x/week  PT DURATION: 8 weeks  PLANNED INTERVENTIONS: Therapeutic exercises, Therapeutic activity, Neuromuscular re-education, Balance training, Gait training, Patient/Family education, Self  Care, Joint mobilization, Stair training, Aquatic Therapy, Dry Needling, Electrical stimulation, Spinal mobilization, Cryotherapy, Moist heat, Taping, Ultrasound, and Manual therapy  PLAN FOR NEXT SESSION: Work on R knee ROM and strength, gait and balance,  DN/MT prn   Mayer Camel, PTA 06/11/22 12:52 PM [facility] [address] [place], [place], [address] Phone: [phone]   Fax:  [phone]

## 2022-06-13 ENCOUNTER — Encounter: Payer: Medicare HMO | Admitting: Physical Therapy

## 2022-06-16 ENCOUNTER — Encounter (HOSPITAL_BASED_OUTPATIENT_CLINIC_OR_DEPARTMENT_OTHER): Payer: Self-pay | Admitting: Physical Therapy

## 2022-06-16 ENCOUNTER — Ambulatory Visit (HOSPITAL_BASED_OUTPATIENT_CLINIC_OR_DEPARTMENT_OTHER): Payer: Medicare HMO | Attending: Family Medicine | Admitting: Physical Therapy

## 2022-06-16 DIAGNOSIS — G8929 Other chronic pain: Secondary | ICD-10-CM | POA: Insufficient documentation

## 2022-06-16 DIAGNOSIS — M25661 Stiffness of right knee, not elsewhere classified: Secondary | ICD-10-CM | POA: Insufficient documentation

## 2022-06-16 DIAGNOSIS — R252 Cramp and spasm: Secondary | ICD-10-CM | POA: Insufficient documentation

## 2022-06-16 DIAGNOSIS — M25561 Pain in right knee: Secondary | ICD-10-CM | POA: Diagnosis present

## 2022-06-16 DIAGNOSIS — R2681 Unsteadiness on feet: Secondary | ICD-10-CM | POA: Diagnosis present

## 2022-06-16 NOTE — Therapy (Signed)
OUTPATIENT PHYSICAL THERAPY TREATMENT   Patient Name: Hayley Case MRN: [id number] DOB:Feb 17, 1950, 72 y.o., female Today's Date: 06/16/2022  END OF SESSION:  PT End of Session - 06/16/22 0949     Visit Number 9    Date for PT Re-Evaluation 07/03/22    Authorization Type [employer]ET[employer] MCR    PT Start Time 0815    PT Stop Time 0900    PT Time Calculation (min) 45 min    Activity Tolerance Patient tolerated treatment well    Behavior During Therapy WFL for tasks assessed/performed                Past Medical History:  Diagnosis Date   CKD (chronic kidney disease)    Diabetes mellitus without complication (HCC)    Hyperlipidemia    Hypertension    Past Surgical History:  Procedure Laterality Date   BREAST BIOPSY     BREAST EXCISIONAL BIOPSY Left    Patient Active Problem List   Diagnosis Date Noted   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 04/21/2022   Primary osteoarthritis of right knee 03/31/2022   Type 2 diabetes mellitus with stage 4 chronic kidney disease, with long-term current use of insulin (HCC) 07/02/2021   Arthritis of hip 04/18/2021   Background diabetic retinopathy (HCC) 04/18/2021   Chronic kidney disease, stage 3b (HCC) 04/18/2021   Dyslipidemia 04/18/2021   Family history of malignant neoplasm of digestive organs 04/18/2021   Gouty arthritis 04/18/2021   Hearing loss 04/18/2021   Hyperglycemia due to type 2 diabetes mellitus (HCC) 04/18/2021   Long term (current) use of insulin (HCC) 04/18/2021   Obstructive sleep apnea (adult) (pediatric) 04/18/2021   Peripheral arterial disease (HCC) 04/18/2021   Personal history of colonic polyps 04/18/2021   Flank pain 10/14/2019   Type 2 diabetes mellitus with diabetic polyneuropathy, with long-term current use of insulin (HCC) 12/01/2018   Type 2 diabetes mellitus with retinopathy, with long-term current use of insulin (HCC) 12/01/2018   Type 2 diabetes mellitus with stage 3b chronic kidney disease, with  long-term current use of insulin (HCC) 11/30/2018   Non-intractable vomiting    Acute hepatitis 07/13/2018   Acute on chronic renal insufficiency 07/12/2018   Diabetes mellitus (HCC) 12/08/2016   Essential hypertension 12/08/2016   Hyperlipidemia associated with type 2 diabetes mellitus (HCC) 12/08/2016    PCP: Olive Bass, FNP   REFERRING PROVIDER: Myra Rude, MD   REFERRING DIAG: M17.11 (ICD-10-CM) - Primary osteoarthritis of right knee   THERAPY DIAG:  Chronic pain of right knee  Stiffness of right knee, not elsewhere classified  Unsteadiness on feet  Cramp and spasm  Rationale for Evaluation and Treatment: Rehabilitation  ONSET DATE: last month or so  SUBJECTIVE:   SUBJECTIVE STATEMENT: Pt reports she was tired and had some increased soreness on her Rt side the night of session.  She reports the ktape stayed on 1.5 days with good tolerance and no skin irritation.   PERTINENT HISTORY: DM, CKD, HTN, LBP, HOH, HA PAIN:  Are you having pain? Yes: NPRS scale: 3/10 Pain location: R knee ant/medial  Pain description: intense sharp Aggravating factors: prolonged sitting, standing and walking Relieving factors: Tylenol, ice, reclining   PRECAUTIONS: None  WEIGHT BEARING RESTRICTIONS: No  FALLS:  Has patient fallen in last 6 months? No  LIVING ENVIRONMENT: Lives with: lives with their son Lives in: House/apartment Stairs: No Has following equipment at home: Single point cane  OCCUPATION: retired  PLOF: Independent  PATIENT GOALS: to find a way to tolerate the pain  NEXT MD VISIT: waiting to schedule gel injections  OBJECTIVE:   DIAGNOSTIC FINDINGS:  XR IMPRESSION: 1. Patellofemoral degenerative changes. 2. Calcified atherosclerotic changes in the femoral vessels.  [place]: degenerative changes in R knee  PATIENT SURVEYS:  FOTO 31 (44 predicted)  COGNITION: Overall cognitive status: Within functional limits for tasks  assessed     SENSATION: Neuropathy in B hands and feet   MUSCLE LENGTH: HS:Bil HS marked Quads: tight R Piriformis: WNL Hip Flexors: WNL  Heelcords: tight B   POSTURE: rounded shoulders and forward head  PALPATION: Palpation: TTP at medial knee, med patella, joint line and Patellar tendon. Also R rectus femoris, VMO and ADDuctors Patellar Mobility: painful med/lat, but WNL   LOWER EXTREMITY ROM:   A/P ROM Right eval Left eval  Knee flexion 114/118 132  Knee extension 0 0   (Blank rows = not tested)  LOWER EXTREMITY MMT: 5/5  LOWER EXTREMITY SPECIAL TESTS:  Step up/Down: TBD  FUNCTIONAL TESTS:  5 times sit to stand: 19.66 sec 6 minute walk test: 894 ft pain 6/10  05/12/22 Dynamic Gait Index: 19/24  05/12/22 Berg Balance Assessment: 51/56 05/12/22  GAIT: Distance walked: 20 Assistive device utilized: None Level of assistance: Complete Independence Comments: No significant gait deviations, but further functional gait assessments shoulder be completed   TODAY'S TREATMENT:                                                                                                                              DATE:   06/16/22 Pt seen for aquatic therapy today.  Treatment took place in water 3.5-4. ft in depth at the [facility] pool. Temp of water was 91.  Pt entered/exited the pool via stairs independently with bilat rail in step-to pattern. * unsupported walking forward / backward * side stepping with arm abdct/ addct with rainbow hand floats x 2 laps * holding rainbow hand floats under water at side: marching forward * holding wall:  hip abdct/ addct x 10; leg swings into hip flex/ext (straight knee);  heel/toe raises x 10;  single leg clams x 10 each LE;  relaxed squats with vertical trunk x 10;  * quad stretch with foot on bench in water (while standing on blue step), 15s x 2, each LE  * ascending/ descending 6 steps with reciprocal pattern and UE support on rails *  STS from bench in water with blue step under feet, UE on barbell - cues for hip hinge, controlled descent, and forward arm reach x 10 * when dried off: applied reg Rock tape to medial Rt knee at joint line (superior to inferior, curving in towards tibial tuberosity) with knee in flexed position and perpendicular strip at joint line.  Instructed pt on safe tape removal and wear time; pt verbalized understanding  06/11/22 Pt seen for aquatic therapy today.  Treatment took place in water 3.5-4  ft in depth at the [facility] pool. Temp of water was 91.  Pt entered/exited the pool via stairs independently with bilat rail in step-to pattern. * unsupported walking forward  * holding yellow hand floats - walking backwards, side stepping, marching * side stepping with arm abdct/ addct * holding wall:  hip abdct/ addct x 10; leg swings into hip flex/ext (straight knee);  heel/toe raises x 10;  single leg clams x 10 each LE;  relaxed squats with vertical trunk x 5; side lunge for adductor stretch (min stretch felt) * quad stretch with foot on 2nd step, 15s x 2, each LE  * back against wall:  3 way LE stretch (ITB, adductor, hamstring) 15 sec each position, each LE * when dried off: applied reg Rock tape to medial Rt knee at joint line (superior to inferior, curving in towards tibial tuberosity) with knee in flexed position and perpendicular strip at joint line.  Instructed pt on safe tape removal and wear time; pt verbalized understanding   06/06/2022  Therapeutic Exercise: to improve strength and mobility.  Demo, verbal and tactile cues throughout for technique. Bike L1 x 7 min  Bridges 2 x 10 SLR 2 x 10 bil  Manual Therapy: to decrease muscle spasm and pain and improve mobility STM/TPR to R quads, adductors, LCL, sartorius, patellar mobs, skilled palpation and monitoring during dry needling. Trigger Point Dry-Needling  Treatment instructions: Expect mild to moderate muscle soreness. S/S of  pneumothorax if dry needled over a lung field, and to seek immediate medical attention should they occur. Patient verbalized understanding of these instructions and education. Patient Consent Given: Yes Education handout provided: Previously provided Muscles treated: R adductor longus Electrical stimulation performed: No Parameters: N/A Treatment response/outcome: Twitch Response Elicited and Palpable Increase in Muscle Length   06/04/22  Therapeutic Exercise: to improve strength and mobility.  Demo, verbal and tactile cues throughout for technique. Nustep L5 x 6 min Step ups RLE 6' x 10  R lateral step ups 6' x 10 Standing march 2# x 10 B Standing hip abduction x 10 2# B Standing hip extension x 10 2# B Bridge with red TB 2x10 S/L clamshells green TB 2x10 Bil  Manual Therapy: to decrease muscle spasm and pain and improve mobility STM/TPR to R MCL, pes anserine   05/28/22  Therapeutic Exercise: to improve strength and mobility.  Demo, verbal and tactile cues throughout for technique. Nustep L5 x 6 min  Supine SLR 2 x 10 bil  Bridge 2 x 10  Supine clams RTB 2 x 10  S/L clams RTB 2 x 10 bil  Manual Therapy: to decrease muscle spasm and pain and improve mobility STM/TPR to R MCL, pes anserine, skilled palpation and monitoring during dry needling. Trigger Point Dry-Needling  Treatment instructions: Expect mild to moderate muscle soreness. S/S of pneumothorax if dry needled over a lung field, and to seek immediate medical attention should they occur. Patient verbalized understanding of these instructions and education. Patient Consent Given: Yes Education handout provided: Yes Muscles treated: R vastus lateralis Electrical stimulation performed: No Parameters: N/A Treatment response/outcome: Twitch Response Elicited and Palpable Increase in Muscle Length   05/21/22  Therapeutic Exercise: to improve strength and mobility.  Demo, verbal and tactile cues throughout for  technique. Bike L1 x 6 min  Supine SLR 2 x 10 bil  Bridge x 10  Bridge with ball squeeze 2 x 10  Supine clams RTB 2 x 10  Bridge with hip abduction RTB  x 10  S/l clams RTB 2 x 10 bil  Manual Therapy: to decrease muscle spasm and pain and improve mobility STM/TPR to R MCL, pes anserine Ultrasound: x 8 min to R medial knee 1 MHz, 1.2 w/cm2 cont to decrease inflammation/pain   PATIENT EDUCATION:  Education details: Ktape rationale, safe removal,  aquatic therapy exercise rationale and modifications  Person educated: Patient Education method: Programmer, multimedia, Demonstration, Verbal cues Education comprehension: verbalized understanding and returned demonstration  HOME EXERCISE PROGRAM: Access Code: [email or web address] URL: [email or web address] Date: 05/21/2022 Prepared by: Harrie Foreman  Exercises - Supine Quadricep Sets  - 1 x daily - 7 x weekly - 1-2 sets - 15 reps - 5 second hold - Supine Knee Extension Strengthening  - 1 x daily - 7 x weekly - 1-2 sets - 10 reps - 5 sec hold - Supine Active Straight Leg Raise (Mirrored)  - 1 x daily - 7 x weekly - 1-2 sets - 10 reps - Seated Hamstring Stretch  - 2 x daily - 7 x weekly - 1 sets - 3 reps - 30-60 sec hold - Quadricep Stretch with Chair and Counter Support  - 2 x daily - 7 x weekly - 1 sets - 3 reps - 30-60 sec hold - Standing Gastroc Stretch  - 2 x daily - 7 x weekly - 3 reps - 30s hold - Standing Soleus Stretch (Mirrored)  - 2 x daily - 7 x weekly - 3 reps - 30s hold - Prone Knee Flexion  - 1 x daily - 7 x weekly - 2 sets - 10 reps - Prone Quadriceps Stretch with Strap  - 1 x daily - 7 x weekly - 1 sets - 2 reps - 1 month  hold - Prone Hip Extension with Bent Knee  - 1 x daily - 7 x weekly - 2 sets - 10 reps - Supine Bridge  - 1 x daily - 7 x weekly - 2 sets - 10 reps - Supine Bridge with Resistance Band  - 1 x daily - 7 x weekly - 2 sets - 10 reps - Supine Bridge with Mini Swiss Ball Between Knees  - 1 x daily - 7 x weekly -  2 sets - 10 reps - Hooklying Clamshell with Resistance  - 1 x daily - 7 x weekly - 2 sets - 10 reps - Clamshell with Resistance  - 1 x daily - 7 x weekly - 2 sets - 10 reps  ASSESSMENT:  CLINICAL IMPRESSION: Pt reported occsional twinges in Rt medial knee and adductors during session. Positive response with kinesiotape application ; retaped today at end of session. Pt had good tolerance of aquatic exercises, reporting gradual reduction of Rt knee pain to 1-2/10 at end of session.  Rt adductors/hamstrings remain tight.  Pt will benefit from adductor stretch on land. Pt  continues to demonstrate potential for improvement and would benefit from continued skilled therapy to address impairments.      OBJECTIVE IMPAIRMENTS: decreased activity tolerance, decreased balance, difficulty walking, decreased ROM, decreased strength, increased muscle spasms, impaired flexibility, and pain.   ACTIVITY LIMITATIONS: sitting, standing, squatting, stairs, bed mobility, and locomotion level  PARTICIPATION LIMITATIONS: meal prep, cleaning, driving, and shopping  PERSONAL FACTORS: Transportation and 3+ comorbidities: DM, CKD, HTN, LBP, HOH, HA  are also affecting patient's functional outcome.   REHAB POTENTIAL: Good  CLINICAL DECISION MAKING: Stable/uncomplicated  EVALUATION COMPLEXITY: Low   SHORT TERM GOALS: Target date: 05/22/2022    Ind with initial  HEP Baseline: Goal status: MET 05/16/22-reviewed. 05/21/22- met  2.  Assess DGI and by 2nd land visit Baseline:  Goal status: MET   LONG TERM GOALS: Target date: 07/03/2022   Ind with advanced HEP and its progression Baseline:  Goal status: IN PROGRESS  2. Improved R knee ROM to 0-125 deg to normalize gait and functional mobility Baseline:  Goal status: IN PROGRESS  3.  Improved LE strength as evidenced by decrease 5XSTS by >= 2.3 sec  Baseline:  Goal status: IN PROGRESS  4.  Able to safely amb 600 feet with knee pain <= 4/10 to improve  access to community. Baseline: 6/10 Goal status: IN PROGRESS  5.  Decreased pain in the R knee by >=75% with ADLs to improve QOL. Baseline:  Goal status: IN PROGRESS  6.  Pt to demonstrate >= 22/24 on the DGI to decrease fall risk. Baseline: 19/24 Goal status: IN PROGRESS  7.  Improved FOTO to 44 showing functional improvement Baseline: 31 Goal status: IN PROGRESS    PLAN:  PT FREQUENCY: 2x/week  PT DURATION: 8 weeks  PLANNED INTERVENTIONS: Therapeutic exercises, Therapeutic activity, Neuromuscular re-education, Balance training, Gait training, Patient/Family education, Self Care, Joint mobilization, Stair training, Aquatic Therapy, Dry Needling, Electrical stimulation, Spinal mobilization, Cryotherapy, Moist heat, Taping, Ultrasound, and Manual therapy  PLAN FOR NEXT SESSION: Work on R knee ROM and strength, gait and balance,  DN/MT prn   Mayer Camel, PTA 06/16/22 9:53 AM [facility] [address] [place], [place], [address] Phone: [phone]   Fax:  [phone]

## 2022-06-17 NOTE — Therapy (Signed)
OUTPATIENT PHYSICAL THERAPY TREATMENT/PROGRESS NOTE  PROGRESS NOTE  Reporting Period 05/08/22 to 06/18/22   See note below for Objective Data and Assessment of Progress/Goals.    Patient Name: Hayley Case MRN: [id number] DOB:1950/03/30, 72 y.o., female Today's Date: 06/18/2022  END OF SESSION:  PT End of Session - 06/18/22 0845     Visit Number 10    Date for PT Re-Evaluation 07/03/22    Authorization Type [employer]ET[employer] MCR    PT Start Time 0845    PT Stop Time 0930    PT Time Calculation (min) 45 min    Activity Tolerance Patient tolerated treatment well    Behavior During Therapy WFL for tasks assessed/performed                Past Medical History:  Diagnosis Date   CKD (chronic kidney disease)    Diabetes mellitus without complication (HCC)    Hyperlipidemia    Hypertension    Past Surgical History:  Procedure Laterality Date   BREAST BIOPSY     BREAST EXCISIONAL BIOPSY Left    Patient Active Problem List   Diagnosis Date Noted   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 04/21/2022   Primary osteoarthritis of right knee 03/31/2022   Type 2 diabetes mellitus with stage 4 chronic kidney disease, with long-term current use of insulin (HCC) 07/02/2021   Arthritis of hip 04/18/2021   Background diabetic retinopathy (HCC) 04/18/2021   Chronic kidney disease, stage 3b (HCC) 04/18/2021   Dyslipidemia 04/18/2021   Family history of malignant neoplasm of digestive organs 04/18/2021   Gouty arthritis 04/18/2021   Hearing loss 04/18/2021   Hyperglycemia due to type 2 diabetes mellitus (HCC) 04/18/2021   Long term (current) use of insulin (HCC) 04/18/2021   Obstructive sleep apnea (adult) (pediatric) 04/18/2021   Peripheral arterial disease (HCC) 04/18/2021   Personal history of colonic polyps 04/18/2021   Flank pain 10/14/2019   Type 2 diabetes mellitus with diabetic polyneuropathy, with long-term current use of insulin (HCC) 12/01/2018   Type 2 diabetes mellitus  with retinopathy, with long-term current use of insulin (HCC) 12/01/2018   Type 2 diabetes mellitus with stage 3b chronic kidney disease, with long-term current use of insulin (HCC) 11/30/2018   Non-intractable vomiting    Acute hepatitis 07/13/2018   Acute on chronic renal insufficiency 07/12/2018   Diabetes mellitus (HCC) 12/08/2016   Essential hypertension 12/08/2016   Hyperlipidemia associated with type 2 diabetes mellitus (HCC) 12/08/2016    PCP: Olive Bass, FNP   REFERRING PROVIDER: Lenda Kelp, MD   REFERRING DIAG: M17.11 (ICD-10-CM) - Primary osteoarthritis of right knee   THERAPY DIAG:  Chronic pain of right knee  Stiffness of right knee, not elsewhere classified  Unsteadiness on feet  Cramp and spasm  Other abnormalities of gait and mobility  Rationale for Evaluation and Treatment: Rehabilitation  ONSET DATE: last month or so  SUBJECTIVE:   SUBJECTIVE STATEMENT: Took a Tylenol this morning.  I'm still scared about falling.   PERTINENT HISTORY: DM, CKD, HTN, LBP, HOH, HA PAIN:  Are you having pain? Yes: NPRS scale: 2/10 Pain location: R knee ant/medial  Pain description: intense sharp Aggravating factors: prolonged sitting, standing and walking Relieving factors: Tylenol, ice, reclining   PRECAUTIONS: None  WEIGHT BEARING RESTRICTIONS: No  FALLS:  Has patient fallen in last 6 months? No  LIVING ENVIRONMENT: Lives with: lives with their son Lives in: House/apartment Stairs: No Has following equipment at home: Single point cane  OCCUPATION: retired  PLOF: Independent  PATIENT GOALS: to find a way to tolerate the pain  NEXT MD VISIT: waiting to schedule gel injections  OBJECTIVE:   DIAGNOSTIC FINDINGS:  XR IMPRESSION: 1. Patellofemoral degenerative changes. 2. Calcified atherosclerotic changes in the femoral vessels.  [place]: degenerative changes in R knee  PATIENT SURVEYS:  FOTO 31 (44 predicted) ;  06/18/22 FOTO  59  COGNITION: Overall cognitive status: Within functional limits for tasks assessed     SENSATION: Neuropathy in B hands and feet   MUSCLE LENGTH: HS:Bil HS marked Quads: tight R Piriformis: WNL Hip Flexors: WNL  Heelcords: tight B   POSTURE: rounded shoulders and forward head  PALPATION: Palpation: TTP at medial knee, med patella, joint line and Patellar tendon. Also R rectus femoris, VMO and ADDuctors Patellar Mobility: painful med/lat, but WNL   LOWER EXTREMITY ROM:   A/P ROM Right eval Left eval Right 06/18/22  Knee flexion 114/118 132 122  Knee extension 0 0 0   (Blank rows = not tested)  LOWER EXTREMITY MMT: 5/5  LOWER EXTREMITY SPECIAL TESTS:  Step up/Down: TBD  FUNCTIONAL TESTS:  5 times sit to stand: 19.66 sec 06/18/22 12.92 sec 6 minute walk test: 894 ft pain 6/10  05/12/22 Dynamic Gait Index: 19/24  05/12/22 Berg Balance Assessment: 51/56 05/12/22  GAIT: Distance walked: 20 Assistive device utilized: None Level of assistance: Complete Independence Comments: No significant gait deviations, but further functional gait assessments shoulder be completed   TODAY'S TREATMENT:                                                                                                                              DATE:    06/18/2022  Therapeutic Exercise: to improve strength and mobility.  Demo, verbal and tactile cues throughout for technique. Bike L2 x 6 min  Seated PF with double blue band (simulating gas pedal) x 20 R gastroc stretch 2x 30 sec Stepping in and over 6 inch step x 10 both ways Obstacle course working on stepping over with L LE and also change of surfaces and stop/turn. Seated focused breathing techniques  Therapeutic Activities: FOTO, goals assessed see objective charts and goals 5xSTS, gait x 600 ft, ROM   06/16/22 Pt seen for aquatic therapy today.  Treatment took place in water 3.5-4. ft in depth at the [facility] pool. Temp of water was  91.  Pt entered/exited the pool via stairs independently with bilat rail in step-to pattern. * unsupported walking forward / backward * side stepping with arm abdct/ addct with rainbow hand floats x 2 laps * holding rainbow hand floats under water at side: marching forward * holding wall:  hip abdct/ addct x 10; leg swings into hip flex/ext (straight knee);  heel/toe raises x 10;  single leg clams x 10 each LE;  relaxed squats with vertical trunk x 10;  * quad stretch with foot on bench in water (while standing on  blue step), 15s x 2, each LE  * ascending/ descending 6 steps with reciprocal pattern and UE support on rails * STS from bench in water with blue step under feet, UE on barbell - cues for hip hinge, controlled descent, and forward arm reach x 10 * when dried off: applied reg Rock tape to medial Rt knee at joint line (superior to inferior, curving in towards tibial tuberosity) with knee in flexed position and perpendicular strip at joint line.  Instructed pt on safe tape removal and wear time; pt verbalized understanding  06/11/22 Pt seen for aquatic therapy today.  Treatment took place in water 3.5-4 ft in depth at the [facility] pool. Temp of water was 91.  Pt entered/exited the pool via stairs independently with bilat rail in step-to pattern. * unsupported walking forward  * holding yellow hand floats - walking backwards, side stepping, marching * side stepping with arm abdct/ addct * holding wall:  hip abdct/ addct x 10; leg swings into hip flex/ext (straight knee);  heel/toe raises x 10;  single leg clams x 10 each LE;  relaxed squats with vertical trunk x 5; side lunge for adductor stretch (min stretch felt) * quad stretch with foot on 2nd step, 15s x 2, each LE  * back against wall:  3 way LE stretch (ITB, adductor, hamstring) 15 sec each position, each LE * when dried off: applied reg Rock tape to medial Rt knee at joint line (superior to inferior, curving in towards  tibial tuberosity) with knee in flexed position and perpendicular strip at joint line.  Instructed pt on safe tape removal and wear time; pt verbalized understanding   06/06/2022  Therapeutic Exercise: to improve strength and mobility.  Demo, verbal and tactile cues throughout for technique. Bike L1 x 7 min  Bridges 2 x 10 SLR 2 x 10 bil  Manual Therapy: to decrease muscle spasm and pain and improve mobility STM/TPR to R quads, adductors, LCL, sartorius, patellar mobs, skilled palpation and monitoring during dry needling. Trigger Point Dry-Needling  Treatment instructions: Expect mild to moderate muscle soreness. S/S of pneumothorax if dry needled over a lung field, and to seek immediate medical attention should they occur. Patient verbalized understanding of these instructions and education. Patient Consent Given: Yes Education handout provided: Previously provided Muscles treated: R adductor longus Electrical stimulation performed: No Parameters: N/A Treatment response/outcome: Twitch Response Elicited and Palpable Increase in Muscle Length   06/04/22  Therapeutic Exercise: to improve strength and mobility.  Demo, verbal and tactile cues throughout for technique. Nustep L5 x 6 min Step ups RLE 6' x 10  R lateral step ups 6' x 10 Standing march 2# x 10 B Standing hip abduction x 10 2# B Standing hip extension x 10 2# B Bridge with red TB 2x10 S/L clamshells green TB 2x10 Bil  Manual Therapy: to decrease muscle spasm and pain and improve mobility STM/TPR to R MCL, pes anserine   05/28/22  Therapeutic Exercise: to improve strength and mobility.  Demo, verbal and tactile cues throughout for technique. Nustep L5 x 6 min  Supine SLR 2 x 10 bil  Bridge 2 x 10  Supine clams RTB 2 x 10  S/L clams RTB 2 x 10 bil  Manual Therapy: to decrease muscle spasm and pain and improve mobility STM/TPR to R MCL, pes anserine, skilled palpation and monitoring during dry needling. Trigger Point  Dry-Needling  Treatment instructions: Expect mild to moderate muscle soreness. S/S of pneumothorax if  dry needled over a lung field, and to seek immediate medical attention should they occur. Patient verbalized understanding of these instructions and education. Patient Consent Given: Yes Education handout provided: Yes Muscles treated: R vastus lateralis Electrical stimulation performed: No Parameters: N/A Treatment response/outcome: Twitch Response Elicited and Palpable Increase in Muscle Length  PATIENT EDUCATION:  Education details: Ktape rationale, safe removal,  aquatic therapy exercise rationale and modifications  Person educated: Patient Education method: Programmer, multimedia, Demonstration, Verbal cues Education comprehension: verbalized understanding and returned demonstration  HOME EXERCISE PROGRAM: Access Code: [email or web address] URL: [email or web address] Date: 05/21/2022 Prepared by: Harrie Foreman  Exercises - Supine Quadricep Sets  - 1 x daily - 7 x weekly - 1-2 sets - 15 reps - 5 second hold - Supine Knee Extension Strengthening  - 1 x daily - 7 x weekly - 1-2 sets - 10 reps - 5 sec hold - Supine Active Straight Leg Raise (Mirrored)  - 1 x daily - 7 x weekly - 1-2 sets - 10 reps - Seated Hamstring Stretch  - 2 x daily - 7 x weekly - 1 sets - 3 reps - 30-60 sec hold - Quadricep Stretch with Chair and Counter Support  - 2 x daily - 7 x weekly - 1 sets - 3 reps - 30-60 sec hold - Standing Gastroc Stretch  - 2 x daily - 7 x weekly - 3 reps - 30s hold - Standing Soleus Stretch (Mirrored)  - 2 x daily - 7 x weekly - 3 reps - 30s hold - Prone Knee Flexion  - 1 x daily - 7 x weekly - 2 sets - 10 reps - Prone Quadriceps Stretch with Strap  - 1 x daily - 7 x weekly - 1 sets - 2 reps - 1 month  hold - Prone Hip Extension with Bent Knee  - 1 x daily - 7 x weekly - 2 sets - 10 reps - Supine Bridge  - 1 x daily - 7 x weekly - 2 sets - 10 reps - Supine Bridge with Resistance Band  -  1 x daily - 7 x weekly - 2 sets - 10 reps - Supine Bridge with Mini Swiss Ball Between Knees  - 1 x daily - 7 x weekly - 2 sets - 10 reps - Hooklying Clamshell with Resistance  - 1 x daily - 7 x weekly - 2 sets - 10 reps - Clamshell with Resistance  - 1 x daily - 7 x weekly - 2 sets - 10 reps  ASSESSMENT:  CLINICAL IMPRESSION: Marylouise Stacks D Sobieski "Scarlette Calico" is progressing well with LTGs meeting her 5xSTS, gait and FOTO goals today. Her gait is significantly improved though she does fatigue after 300 ft and gait speed slows and trunk lean increases. Her R knee flexion has improved to 122 deg but is still 10 deg less than L knee.. She reports having difficulty relaxing due to caregiver responsibilities and fear of driving and falling, so we discussed using short focused breathing activities throughout the day to help with this. She did well with balance challenges today, but needs cueing to step over obstacles with her left LE. Scarlette Calico continues to demonstrate potential for improvement and would benefit from continued skilled therapy to address impairments.      OBJECTIVE IMPAIRMENTS: decreased activity tolerance, decreased balance, difficulty walking, decreased ROM, decreased strength, increased muscle spasms, impaired flexibility, and pain.   ACTIVITY LIMITATIONS: sitting, standing, squatting, stairs, bed mobility, and locomotion level  PARTICIPATION LIMITATIONS: meal prep,  cleaning, driving, and shopping  PERSONAL FACTORS: Transportation and 3+ comorbidities: DM, CKD, HTN, LBP, HOH, HA  are also affecting patient's functional outcome.   REHAB POTENTIAL: Good  CLINICAL DECISION MAKING: Stable/uncomplicated  EVALUATION COMPLEXITY: Low   SHORT TERM GOALS: Target date: 05/22/2022    Ind with initial HEP Baseline: Goal status: MET 05/16/22-reviewed. 05/21/22- met  2.  Assess DGI and by 2nd land visit Baseline:  Goal status: MET   LONG TERM GOALS: Target date: 07/03/2022   Ind with  advanced HEP and its progression Baseline:  Goal status: IN PROGRESS  2. Improved R knee ROM to 0-125 deg to normalize gait and functional mobility Baseline:  Goal status: IN PROGRESS  3.  Improved LE strength as evidenced by decrease 5XSTS by >= 2.3 sec  Baseline:  Goal status: MET 06/18/22 12.92 sec  4.  Able to safely amb 600 feet with knee pain <= 4/10 to improve access to community. Baseline: 6/10 Goal status: MET  5.  Decreased pain in the R knee by >=75% with ADLs to improve QOL. Baseline:  Goal status: IN PROGRESS 06/18/22 50% improvement  6.  Pt to demonstrate >= 22/24 on the DGI to decrease fall risk. Baseline: 19/24 Goal status: IN PROGRESS  7.  Improved FOTO to 44 showing functional improvement Baseline: 31 Goal status: MET  06/18/22 59    PLAN:  PT FREQUENCY: 2x/week  PT DURATION: 8 weeks  PLANNED INTERVENTIONS: Therapeutic exercises, Therapeutic activity, Neuromuscular re-education, Balance training, Gait training, Patient/Family education, Self Care, Joint mobilization, Stair training, Aquatic Therapy, Dry Needling, Electrical stimulation, Spinal mobilization, Cryotherapy, Moist heat, Taping, Ultrasound, and Manual therapy  PLAN FOR NEXT SESSION: Work on R knee ROM and strength, gait and balance,  DN/MT prn   Solon Palm, PT 06/18/22 10:44 AM [facility] [address] [place], [place] [address] [phone]  Fax: [phone]

## 2022-06-18 ENCOUNTER — Ambulatory Visit: Payer: Medicare HMO | Attending: Family Medicine | Admitting: Physical Therapy

## 2022-06-18 ENCOUNTER — Encounter: Payer: Self-pay | Admitting: Physical Therapy

## 2022-06-18 DIAGNOSIS — R2681 Unsteadiness on feet: Secondary | ICD-10-CM | POA: Diagnosis present

## 2022-06-18 DIAGNOSIS — G8929 Other chronic pain: Secondary | ICD-10-CM | POA: Insufficient documentation

## 2022-06-18 DIAGNOSIS — R252 Cramp and spasm: Secondary | ICD-10-CM | POA: Insufficient documentation

## 2022-06-18 DIAGNOSIS — M25661 Stiffness of right knee, not elsewhere classified: Secondary | ICD-10-CM | POA: Insufficient documentation

## 2022-06-18 DIAGNOSIS — M25561 Pain in right knee: Secondary | ICD-10-CM | POA: Diagnosis present

## 2022-06-18 DIAGNOSIS — R2689 Other abnormalities of gait and mobility: Secondary | ICD-10-CM | POA: Insufficient documentation

## 2022-06-19 ENCOUNTER — Ambulatory Visit (INDEPENDENT_AMBULATORY_CARE_PROVIDER_SITE_OTHER): Payer: Medicare HMO | Admitting: Psychology

## 2022-06-19 DIAGNOSIS — F4323 Adjustment disorder with mixed anxiety and depressed mood: Secondary | ICD-10-CM

## 2022-06-19 NOTE — Progress Notes (Signed)
[facility] Counselor/Therapist Progress Note  Patient ID: Hayley Case, MRN: [id number],    Date: 06/19/2022  Time Spent: 11:00am-11:55am     55 minutes   Treatment Type: Individual Therapy  Reported Symptoms: stress, sadness  Mental Status Exam: Appearance:  Casual     Behavior: Appropriate  Motor: Normal  Speech/Language:  Normal Rate  Affect: Appropriate  Mood: normal  Thought process: normal  Thought content:   WNL  Sensory/Perceptual disturbances:   WNL  Orientation: oriented to person, place, time/date, and situation  Attention: Good  Concentration: Good  Memory: WNL  Fund of knowledge:  Good  Insight:   Good  Judgment:  Good  Impulse Control: Good   Risk Assessment: Danger to Self:  No Self-injurious Behavior: No Danger to Others: No Duty to Warn:no Physical Aggression / Violence:No  Access to Firearms a concern: No  Gang Involvement:No   Subjective: Pt present for face-to-face individual therapy via video.  Pt consents to telehealth video session due to COVID 19 pandemic. Location of pt: home Location of therapist: home office.   Pt talked about issues with family.   Her daughter said some things that hurt her feelings and pt feels like her son takes advantage of her at times.  Helped pt process her feelings and relationship dynamics.  Pt talked about her health.  She is going to physical therapy for her knee pain.   Pt talked about her brother dying.  She went to the funeral with her family.  Helped pt process her feelings and grief.  Pt talked about concerns about her son.   He is on dialysis and has been having additional health issues.  Addressed pt's worries.  Encouraged pt to increase self care.  Provided supportive therapy.  Interventions: Cognitive Behavioral Therapy and Insight-Oriented  Diagnosis: F43.23   Plan of Care: Recommend ongoing therapy.  Pt participated in setting treatment goals.   Pt wants to improve coping skills.   Plan to meet monthly.  Treatment Plan  (treatment plan target date:  11/08/2022) Client Abilities/Strengths  Pt is bright, engaging, and motivated for therapy.  Client Treatment Preferences  Individual therapy.  Client Statement of Needs  Improve copings skills and understand herself better. Improve self esteem.  Symptoms  Depressed or irritable mood. Excessive and/or unrealistic worry that is difficult to control occurring more days than not for at least 6 months about a number of events or activities. Hypervigilance (e.g., feeling constantly on edge, experiencing concentration difficulties, having trouble falling or staying asleep, exhibiting a general state of irritability). Low self-esteem. Problems Addressed  Unipolar Depression, Anxiety Goals 1. Alleviate depressive symptoms and return to previous level of effective functioning. 2. Appropriately grieve the loss in order to normalize mood and to return to previously adaptive level of functioning. Objective Learn and implement behavioral strategies to overcome depression. Target Date: 2022-11-08 Frequency: Monthly  Progress: 40 Modality: individual  Related Interventions Assist the client in developing skills that increase the likelihood of deriving pleasure from behavioral activation (e.g., assertiveness skills, developing an exercise plan, less internal/more external focus, increased social involvement); reinforce success. Engage the client in "behavioral activation," increasing his/her activity level and contact with sources of reward, while identifying processes that inhibit activation. use behavioral techniques such as instruction, rehearsal, role-playing, role reversal, as needed, to facilitate activity in the client's daily life; reinforce success. 3. Develop healthy interpersonal relationships that lead to the alleviation and help prevent the relapse of depression. 4. Develop healthy thinking patterns  and beliefs about self,  others, and the world that lead to the alleviation and help prevent the relapse of depression. 5. Enhance ability to effectively cope with the full variety of life's worries and anxieties. 6. Learn and implement coping skills that result in a reduction of anxiety and worry, and improved daily functioning. Objective Learn and implement problem-solving strategies for realistically addressing worries. Target Date: 2022-11-08 Frequency: Monthly  Progress: 40 Modality: individual  Related Interventions Assign the client a homework exercise in which he/she problem-solves a current problem (see Mastery of Your Anxiety and Worry: Workbook by Elenora Fender and Filbert Schilder or Generalized Anxiety Disorder by Elesa Hacker, and Filbert Schilder); review, reinforce success, and provide corrective feedback toward improvement. Teach the client problem-solving strategies involving specifically defining a problem, generating options for addressing it, evaluating the pros and cons of each option, selecting and implementing an optional action, and reevaluating and refining the action. Objective Learn and implement calming skills to reduce overall anxiety and manage anxiety symptoms. Target Date: 2022-11-08 Frequency: Monthly  Progress: 40 Modality: individual  Related Interventions Assign the client to read about progressive muscle relaxation and other calming strategies in relevant books or treatment manuals (e.g., Progressive Relaxation Training by Twana First; Mastery of Your Anxiety and Worry: Workbook by Earlie Counts). Assign the client homework each session in which he/she practices relaxation exercises daily, gradually applying them progressively from non-anxiety-provoking to anxiety-provoking situations; review and reinforce success while providing corrective feedback toward improvement. Teach the client calming/relaxation skills (e.g., applied relaxation, progressive muscle relaxation, cue controlled relaxation;  mindful breathing; biofeedback) and how to discriminate better between relaxation and tension; teach the client how to apply these skills to his/her daily life. 7. Recognize, accept, and cope with feelings of depression. 8. Reduce overall frequency, intensity, and duration of the anxiety so that daily functioning is not impaired. 9. Resolve the core conflict that is the source of anxiety. 10. Stabilize anxiety level while increasing ability to function on a daily basis. Diagnosis F43.23 Conditions For Discharge Achievement of treatment goals and objectives   Salomon Fick, LCSW

## 2022-06-23 ENCOUNTER — Encounter: Payer: Self-pay | Admitting: Physical Therapy

## 2022-06-23 ENCOUNTER — Ambulatory Visit: Payer: Medicare HMO | Admitting: Physical Therapy

## 2022-06-23 DIAGNOSIS — G8929 Other chronic pain: Secondary | ICD-10-CM

## 2022-06-23 DIAGNOSIS — R252 Cramp and spasm: Secondary | ICD-10-CM

## 2022-06-23 DIAGNOSIS — M25561 Pain in right knee: Secondary | ICD-10-CM | POA: Diagnosis not present

## 2022-06-23 DIAGNOSIS — R2681 Unsteadiness on feet: Secondary | ICD-10-CM

## 2022-06-23 DIAGNOSIS — M25661 Stiffness of right knee, not elsewhere classified: Secondary | ICD-10-CM

## 2022-06-23 NOTE — Therapy (Addendum)
OUTPATIENT PHYSICAL THERAPY TREATMENT   Patient Name: Hayley Case MRN: [id number] DOB:02-19-1950, 72 y.o., female Today's Date: 06/23/2022  END OF SESSION:  PT End of Session - 06/23/22 0847     Visit Number 11    Date for PT Re-Evaluation 07/03/22    Authorization Type [employer]ET[employer] MCR    PT Start Time 0847    PT Stop Time 0930    PT Time Calculation (min) 43 min    Activity Tolerance Patient tolerated treatment well    Behavior During Therapy WFL for tasks assessed/performed                Past Medical History:  Diagnosis Date   CKD (chronic kidney disease)    Diabetes mellitus without complication (HCC)    Hyperlipidemia    Hypertension    Past Surgical History:  Procedure Laterality Date   BREAST BIOPSY     BREAST EXCISIONAL BIOPSY Left    Patient Active Problem List   Diagnosis Date Noted   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 04/21/2022   Primary osteoarthritis of right knee 03/31/2022   Type 2 diabetes mellitus with stage 4 chronic kidney disease, with long-term current use of insulin (HCC) 07/02/2021   Arthritis of hip 04/18/2021   Background diabetic retinopathy (HCC) 04/18/2021   Chronic kidney disease, stage 3b (HCC) 04/18/2021   Dyslipidemia 04/18/2021   Family history of malignant neoplasm of digestive organs 04/18/2021   Gouty arthritis 04/18/2021   Hearing loss 04/18/2021   Hyperglycemia due to type 2 diabetes mellitus (HCC) 04/18/2021   Long term (current) use of insulin (HCC) 04/18/2021   Obstructive sleep apnea (adult) (pediatric) 04/18/2021   Peripheral arterial disease (HCC) 04/18/2021   Personal history of colonic polyps 04/18/2021   Flank pain 10/14/2019   Type 2 diabetes mellitus with diabetic polyneuropathy, with long-term current use of insulin (HCC) 12/01/2018   Type 2 diabetes mellitus with retinopathy, with long-term current use of insulin (HCC) 12/01/2018   Type 2 diabetes mellitus with stage 3b chronic kidney disease, with  long-term current use of insulin (HCC) 11/30/2018   Non-intractable vomiting    Acute hepatitis 07/13/2018   Acute on chronic renal insufficiency 07/12/2018   Diabetes mellitus (HCC) 12/08/2016   Essential hypertension 12/08/2016   Hyperlipidemia associated with type 2 diabetes mellitus (HCC) 12/08/2016    PCP: Olive Bass, FNP   REFERRING PROVIDER: Lenda Kelp, MD   REFERRING DIAG: M17.11 (ICD-10-CM) - Primary osteoarthritis of right knee   THERAPY DIAG:  Chronic pain of right knee  Stiffness of right knee, not elsewhere classified  Unsteadiness on feet  Cramp and spasm  Rationale for Evaluation and Treatment: Rehabilitation  ONSET DATE: last month or so  SUBJECTIVE:   SUBJECTIVE STATEMENT: I stood for a long time cooking and now the back of my knee hurts too.   Still have the tape on.     PERTINENT HISTORY: DM, CKD, HTN, LBP, HOH, HA PAIN:  Are you having pain? Yes: NPRS scale: 5-6/10 Pain location: R knee ant and posterior  Pain description: intense sharp Aggravating factors: prolonged sitting, standing and walking Relieving factors: Tylenol, ice, reclining   PRECAUTIONS: None  WEIGHT BEARING RESTRICTIONS: No  FALLS:  Has patient fallen in last 6 months? No  LIVING ENVIRONMENT: Lives with: lives with their son Lives in: House/apartment Stairs: No Has following equipment at home: Single point cane  OCCUPATION: retired  PLOF: Independent  PATIENT GOALS: to find a way to  tolerate the pain  NEXT MD VISIT: waiting to schedule gel injections  OBJECTIVE:   DIAGNOSTIC FINDINGS:  XR IMPRESSION: 1. Patellofemoral degenerative changes. 2. Calcified atherosclerotic changes in the femoral vessels.  [place]: degenerative changes in R knee  PATIENT SURVEYS:  FOTO 31 (44 predicted) ;  06/18/22 FOTO 59  COGNITION: Overall cognitive status: Within functional limits for tasks assessed     SENSATION: Neuropathy in B hands and feet   MUSCLE  LENGTH: HS:Bil HS marked Quads: tight R Piriformis: WNL Hip Flexors: WNL  Heelcords: tight B   POSTURE: rounded shoulders and forward head  PALPATION: Palpation: TTP at medial knee, med patella, joint line and Patellar tendon. Also R rectus femoris, VMO and ADDuctors Patellar Mobility: painful med/lat, but WNL   LOWER EXTREMITY ROM:   A/P ROM Right eval Left eval Right 06/18/22  Knee flexion 114/118 132 122  Knee extension 0 0 0   (Blank rows = not tested)  LOWER EXTREMITY MMT: 5/5  LOWER EXTREMITY SPECIAL TESTS:  Step up/Down: TBD  FUNCTIONAL TESTS:  5 times sit to stand: 19.66 sec 06/18/22 12.92 sec 6 minute walk test: 894 ft pain 6/10  05/12/22 Dynamic Gait Index: 19/24  05/12/22 Berg Balance Assessment: 51/56 05/12/22  GAIT: Distance walked: 20 Assistive device utilized: None Level of assistance: Complete Independence Comments: No significant gait deviations, but further functional gait assessments shoulder be completed   TODAY'S TREATMENT:                                                                                                                              DATE:   06/23/22 Therapeutic Exercise: to improve strength and mobility.  Demo, verbal and tactile cues throughout for technique.  Nustep L5 x 6 min  HS stretches seated 3 x 30 sec Forward T's at counter 2 x 10 Prone knee bends 3 x 10 Review and Update of HEP Self Care: Self STM with massage stick Manual Therapy: to decrease muscle spasm and pain and improve mobility STM/TPR to R hamstring Application of K-tape to R anterior knee and R hamstring.    06/18/2022  Therapeutic Exercise: to improve strength and mobility.  Demo, verbal and tactile cues throughout for technique. Bike L2 x 6 min  Seated PF with double blue band (simulating gas pedal) x 20 R gastroc stretch 2x 30 sec Stepping in and over 6 inch step x 10 both ways Obstacle course working on stepping over with L LE and also change of surfaces  and stop/turn. Seated focused breathing techniques  Therapeutic Activities: FOTO, goals assessed see objective charts and goals 5xSTS, gait x 600 ft, ROM   06/16/22 Pt seen for aquatic therapy today.  Treatment took place in water 3.5-4. ft in depth at the [facility] pool. Temp of water was 91.  Pt entered/exited the pool via stairs independently with bilat rail in step-to pattern. * unsupported walking forward / backward * side stepping with arm  abdct/ addct with rainbow hand floats x 2 laps * holding rainbow hand floats under water at side: marching forward * holding wall:  hip abdct/ addct x 10; leg swings into hip flex/ext (straight knee);  heel/toe raises x 10;  single leg clams x 10 each LE;  relaxed squats with vertical trunk x 10;  * quad stretch with foot on bench in water (while standing on blue step), 15s x 2, each LE  * ascending/ descending 6 steps with reciprocal pattern and UE support on rails * STS from bench in water with blue step under feet, UE on barbell - cues for hip hinge, controlled descent, and forward arm reach x 10 * when dried off: applied reg Rock tape to medial Rt knee at joint line (superior to inferior, curving in towards tibial tuberosity) with knee in flexed position and perpendicular strip at joint line.  Instructed pt on safe tape removal and wear time; pt verbalized understanding  06/11/22 Pt seen for aquatic therapy today.  Treatment took place in water 3.5-4 ft in depth at the [facility] pool. Temp of water was 91.  Pt entered/exited the pool via stairs independently with bilat rail in step-to pattern. * unsupported walking forward  * holding yellow hand floats - walking backwards, side stepping, marching * side stepping with arm abdct/ addct * holding wall:  hip abdct/ addct x 10; leg swings into hip flex/ext (straight knee);  heel/toe raises x 10;  single leg clams x 10 each LE;  relaxed squats with vertical trunk x 5; side lunge for  adductor stretch (min stretch felt) * quad stretch with foot on 2nd step, 15s x 2, each LE  * back against wall:  3 way LE stretch (ITB, adductor, hamstring) 15 sec each position, each LE * when dried off: applied reg Rock tape to medial Rt knee at joint line (superior to inferior, curving in towards tibial tuberosity) with knee in flexed position and perpendicular strip at joint line.  Instructed pt on safe tape removal and wear time; pt verbalized understanding    PATIENT EDUCATION:  Education details: HEP update and review.  To concentrate on HS exercises this week.   Person educated: Patient Education method: Explanation, Demonstration, Verbal cues Education comprehension: verbalized understanding and returned demonstration  HOME EXERCISE PROGRAM: Access Code: [email or web address] URL: [email or web address] Date: 06/23/2022 Prepared by: Harrie Foreman  Exercises - Supine Quadricep Sets  - 1 x daily - 7 x weekly - 1-2 sets - 15 reps - 5 second hold - Supine Knee Extension Strengthening  - 1 x daily - 7 x weekly - 1-2 sets - 10 reps - 5 sec hold - Supine Active Straight Leg Raise (Mirrored)  - 1 x daily - 7 x weekly - 1-2 sets - 10 reps - Seated Hamstring Stretch  - 2 x daily - 7 x weekly - 1 sets - 3 reps - 30-60 sec hold - Quadricep Stretch with Chair and Counter Support  - 2 x daily - 7 x weekly - 1 sets - 3 reps - 30-60 sec hold - Standing Gastroc Stretch  - 2 x daily - 7 x weekly - 3 reps - 30s hold - Standing Soleus Stretch (Mirrored)  - 2 x daily - 7 x weekly - 3 reps - 30s hold - Prone Knee Flexion  - 1 x daily - 7 x weekly - 2 sets - 10 reps - Prone Quadriceps Stretch with Strap  - 1 x daily - 7  x weekly - 1 sets - 2 reps - 1 month  hold - Prone Hip Extension with Bent Knee  - 1 x daily - 7 x weekly - 2 sets - 10 reps - Supine Bridge with Resistance Band  - 1 x daily - 7 x weekly - 2 sets - 10 reps - Supine Bridge with Mini Swiss Ball Between Knees  - 1 x daily - 7 x  weekly - 2 sets - 10 reps - Hooklying Clamshell with Resistance  - 1 x daily - 7 x weekly - 2 sets - 10 reps - Clamshell with Resistance  - 1 x daily - 7 x weekly - 2 sets - 10 reps - Supine Bridge  - 1 x daily - 7 x weekly - 2 sets - 10 reps - Prone Knee Flexion  - 1 x daily - 7 x weekly - 3 sets - 10 reps - Seated Hamstring Stretch  - 1 x daily - 7 x weekly - 1 sets - 3 reps - 30 sec  hold - Forward T with Counter Support  - 1 x daily - 7 x weekly - 3 sets - 10 reps - Standing Hamstring Curl with Chair Support  - 1 x daily - 7 x weekly - 3 sets - 10 reps  ASSESSMENT:  CLINICAL IMPRESSION: Dorothy D Gilkeson "Scarlette Calico" reported R hamstring pain after standing prolonged period cooking.  Felt the tape on knee helped.  Noted some tissue banding in hamstring, demonstrated self STM with massage stick, also reviewed HS stretches and added some HS strengthening, as R HS much weaker than R, she had difficulty with prone knee bends on R compared to L.  Reapplied tape to R anterior knee and also added strip to R lateral hamstring.  [name] reported pain was much improved following interventions.  [name] continues to demonstrate potential for improvement and would benefit from continued skilled therapy to address impairments.        OBJECTIVE IMPAIRMENTS: decreased activity tolerance, decreased balance, difficulty walking, decreased ROM, decreased strength, increased muscle spasms, impaired flexibility, and pain.   ACTIVITY LIMITATIONS: sitting, standing, squatting, stairs, bed mobility, and locomotion level  PARTICIPATION LIMITATIONS: meal prep, cleaning, driving, and shopping  PERSONAL FACTORS: Transportation and 3+ comorbidities: DM, CKD, HTN, LBP, HOH, HA  are also affecting patient's functional outcome.   REHAB POTENTIAL: Good  CLINICAL DECISION MAKING: Stable/uncomplicated  EVALUATION COMPLEXITY: Low   SHORT TERM GOALS: Target date: 05/22/2022    Ind with initial  HEP Baseline: Goal status: MET 05/16/22-reviewed. 05/21/22- met  2.  Assess DGI and by 2nd land visit Baseline:  Goal status: MET   LONG TERM GOALS: Target date: 07/03/2022   Ind with advanced HEP and its progression Baseline:  Goal status: IN PROGRESS  2. Improved R knee ROM to 0-125 deg to normalize gait and functional mobility Baseline:  Goal status: IN PROGRESS  3.  Improved LE strength as evidenced by decrease 5XSTS by >= 2.3 sec  Baseline:  Goal status: MET 06/18/22 12.92 sec  4.  Able to safely amb 600 feet with knee pain <= 4/10 to improve access to community. Baseline: 6/10 Goal status: MET  5.  Decreased pain in the R knee by >=75% with ADLs to improve QOL. Baseline:  Goal status: IN PROGRESS 06/18/22 50% improvement  6.  Pt to demonstrate >= 22/24 on the DGI to decrease fall risk. Baseline: 19/24 Goal status: IN PROGRESS  7.  Improved  FOTO to 44 showing functional improvement Baseline: 31 Goal status: MET  06/18/22 59    PLAN:  PT FREQUENCY: 2x/week  PT DURATION: 8 weeks  PLANNED INTERVENTIONS: Therapeutic exercises, Therapeutic activity, Neuromuscular re-education, Balance training, Gait training, Patient/Family education, Self Care, Joint mobilization, Stair training, Aquatic Therapy, Dry Needling, Electrical stimulation, Spinal mobilization, Cryotherapy, Moist heat, Taping, Ultrasound, and Manual therapy  PLAN FOR NEXT SESSION: Work on R knee ROM and strength, gait and balance,  DN/MT prn   Jena Gauss, PT, DPT  06/23/22 10:25 AM  [facility] [address] [place], [place] [address] [phone]  Fax: [phone]

## 2022-06-25 ENCOUNTER — Ambulatory Visit (HOSPITAL_BASED_OUTPATIENT_CLINIC_OR_DEPARTMENT_OTHER): Payer: Medicare HMO | Admitting: Physical Therapy

## 2022-06-25 ENCOUNTER — Encounter (HOSPITAL_BASED_OUTPATIENT_CLINIC_OR_DEPARTMENT_OTHER): Payer: Self-pay | Admitting: Physical Therapy

## 2022-06-25 DIAGNOSIS — R2681 Unsteadiness on feet: Secondary | ICD-10-CM

## 2022-06-25 DIAGNOSIS — M25661 Stiffness of right knee, not elsewhere classified: Secondary | ICD-10-CM

## 2022-06-25 DIAGNOSIS — M25561 Pain in right knee: Secondary | ICD-10-CM | POA: Diagnosis not present

## 2022-06-25 DIAGNOSIS — R252 Cramp and spasm: Secondary | ICD-10-CM

## 2022-06-25 DIAGNOSIS — G8929 Other chronic pain: Secondary | ICD-10-CM

## 2022-06-25 NOTE — Therapy (Signed)
OUTPATIENT PHYSICAL THERAPY TREATMENT   Patient Name: Hayley Case MRN: [id number] DOB:1950-05-12, 72 y.o., female Today's Date: 06/25/2022  END OF SESSION:  PT End of Session - 06/25/22 0816     Visit Number 12    Date for PT Re-Evaluation 07/03/22    Authorization Type AETNA MCR    PT Start Time 0808    PT Stop Time 0853    PT Time Calculation (min) 45 min    Behavior During Therapy [facility] for tasks assessed/performed                Past Medical History:  Diagnosis Date   CKD (chronic kidney disease)    Diabetes mellitus without complication (HCC)    Hyperlipidemia    Hypertension    Past Surgical History:  Procedure Laterality Date   BREAST BIOPSY     BREAST EXCISIONAL BIOPSY Left    Patient Active Problem List   Diagnosis Date Noted   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 04/21/2022   Primary osteoarthritis of right knee 03/31/2022   Type 2 diabetes mellitus with stage 4 chronic kidney disease, with long-term current use of insulin (HCC) 07/02/2021   Arthritis of hip 04/18/2021   Background diabetic retinopathy (HCC) 04/18/2021   Chronic kidney disease, stage 3b (HCC) 04/18/2021   Dyslipidemia 04/18/2021   Family history of malignant neoplasm of digestive organs 04/18/2021   Gouty arthritis 04/18/2021   Hearing loss 04/18/2021   Hyperglycemia due to type 2 diabetes mellitus (HCC) 04/18/2021   Long term (current) use of insulin (HCC) 04/18/2021   Obstructive sleep apnea (adult) (pediatric) 04/18/2021   Peripheral arterial disease (HCC) 04/18/2021   Personal history of colonic polyps 04/18/2021   Flank pain 10/14/2019   Type 2 diabetes mellitus with diabetic polyneuropathy, with long-term current use of insulin (HCC) 12/01/2018   Type 2 diabetes mellitus with retinopathy, with long-term current use of insulin (HCC) 12/01/2018   Type 2 diabetes mellitus with stage 3b chronic kidney disease, with long-term current use of insulin (HCC) 11/30/2018    Non-intractable vomiting    Acute hepatitis 07/13/2018   Acute on chronic renal insufficiency 07/12/2018   Diabetes mellitus (HCC) 12/08/2016   Essential hypertension 12/08/2016   Hyperlipidemia associated with type 2 diabetes mellitus (HCC) 12/08/2016    PCP: Olive Bass, FNP   REFERRING PROVIDER: No ref. provider found   REFERRING DIAG: M17.11 (ICD-10-CM) - Primary osteoarthritis of right knee   THERAPY DIAG:  Chronic pain of right knee  Stiffness of right knee, not elsewhere classified  Unsteadiness on feet  Cramp and spasm  Rationale for Evaluation and Treatment: Rehabilitation  ONSET DATE: last month or so  SUBJECTIVE:   SUBJECTIVE STATEMENT: Pt reports that the last tape application did not last long; came off that evening.  She continues with increased pain from cooking last weekend.  She reports she "had to resort to using the cane again".      PERTINENT HISTORY: DM, CKD, HTN, LBP, HOH, HA PAIN:  Are you having pain? Yes: NPRS scale: 5-6/10 Pain location: R knee ant and posterior  Pain description: intense sharp Aggravating factors: prolonged sitting, standing and walking Relieving factors: Tylenol, ice, reclining   PRECAUTIONS: None  WEIGHT BEARING RESTRICTIONS: No  FALLS:  Has patient fallen in last 6 months? No  LIVING ENVIRONMENT: Lives with: lives with their son Lives in: House/apartment Stairs: No Has following equipment at home: Single point cane  OCCUPATION: retired  PLOF: Independent  PATIENT  GOALS: to find a way to tolerate the pain  NEXT MD VISIT: waiting to schedule gel injections  OBJECTIVE:   DIAGNOSTIC FINDINGS:  XR IMPRESSION: 1. Patellofemoral degenerative changes. 2. Calcified atherosclerotic changes in the femoral vessels.  [place]: degenerative changes in R knee  PATIENT SURVEYS:  FOTO 31 (44 predicted) ;  06/18/22 FOTO 59  COGNITION: Overall cognitive status: Within functional limits for tasks  assessed     SENSATION: Neuropathy in B hands and feet   MUSCLE LENGTH: HS:Bil HS marked Quads: tight R Piriformis: WNL Hip Flexors: WNL  Heelcords: tight B   POSTURE: rounded shoulders and forward head  PALPATION: Palpation: TTP at medial knee, med patella, joint line and Patellar tendon. Also R rectus femoris, VMO and ADDuctors Patellar Mobility: painful med/lat, but WNL   LOWER EXTREMITY ROM:   A/P ROM Right eval Left eval Right 06/18/22  Knee flexion 114/118 132 122  Knee extension 0 0 0   (Blank rows = not tested)  LOWER EXTREMITY MMT: 5/5  LOWER EXTREMITY SPECIAL TESTS:  Step up/Down: TBD  FUNCTIONAL TESTS:  5 times sit to stand: 19.66 sec 06/18/22 12.92 sec 6 minute walk test: 894 ft pain 6/10  05/12/22 Dynamic Gait Index: 19/24  05/12/22 Berg Balance Assessment: 51/56 05/12/22  GAIT: Distance walked: 20 Assistive device utilized: None Level of assistance: Complete Independence Comments: No significant gait deviations, but further functional gait assessments shoulder be completed   TODAY'S TREATMENT:                                                                                                                              DATE:   06/25/22 Pt seen for aquatic therapy today.  Treatment took place in water 3.5-4. ft in depth at the [facility] pool. Temp of water was 91.  Pt entered/exited the pool via stairs independently with bilat rail in step-to pattern. * unsupported walking forward / backward, marching  * holding rainbow hand floats under water (bilat) at side / unilateral yellow hand float - for increased core engagement * side stepping with arm abdct/ addct with yellow-> rainbow - no hand floats x 3 laps * holding noodle / yellow hand floats: leg swings into hip flex/ext (straight knee) 3 x 5; hip abdct/ addct 3 x 5;  alternating hamstring curls x 10; heel/toe raises x 5 - switched to holding wall   * holding wall:  heel/toe raises x 10;  single  leg clams x 10 each LE;  * TrA set with blue noodle pull down to thighs x 10; repeated with single/double rainbow hand floats * Staggered stance with kick board row x 10 each LE   06/23/22 Therapeutic Exercise: to improve strength and mobility.  Demo, verbal and tactile cues throughout for technique.  Nustep L5 x 6 min  HS stretches seated 3 x 30 sec Forward T's at counter 2 x 10 Prone knee bends 3 x 10 Review and Update  of HEP Self Care: Self STM with massage stick Manual Therapy: to decrease muscle spasm and pain and improve mobility STM/TPR to R hamstring Application of K-tape to R anterior knee and R hamstring.    06/18/2022  Therapeutic Exercise: to improve strength and mobility.  Demo, verbal and tactile cues throughout for technique. Bike L2 x 6 min  Seated PF with double blue band (simulating gas pedal) x 20 R gastroc stretch 2x 30 sec Stepping in and over 6 inch step x 10 both ways Obstacle course working on stepping over with L LE and also change of surfaces and stop/turn. Seated focused breathing techniques  Therapeutic Activities: FOTO, goals assessed see objective charts and goals 5xSTS, gait x 600 ft, ROM   06/16/22 Pt seen for aquatic therapy today.  Treatment took place in water 3.5-4. ft in depth at the [facility] pool. Temp of water was 91.  Pt entered/exited the pool via stairs independently with bilat rail in step-to pattern. * unsupported walking forward / backward * side stepping with arm abdct/ addct with rainbow hand floats x 2 laps * holding rainbow hand floats under water at side: marching forward * holding wall:  hip abdct/ addct x 10; leg swings into hip flex/ext (straight knee);  heel/toe raises x 10;  single leg clams x 10 each LE;  relaxed squats with vertical trunk x 10;  * quad stretch with foot on bench in water (while standing on blue step), 15s x 2, each LE  * ascending/ descending 6 steps with reciprocal pattern and UE support on  rails * STS from bench in water with blue step under feet, UE on barbell - cues for hip hinge, controlled descent, and forward arm reach x 10 * when dried off: applied reg Rock tape to medial Rt knee at joint line (superior to inferior, curving in towards tibial tuberosity) with knee in flexed position and perpendicular strip at joint line.  Instructed pt on safe tape removal and wear time; pt verbalized understanding  06/11/22 Pt seen for aquatic therapy today.  Treatment took place in water 3.5-4 ft in depth at the [facility] pool. Temp of water was 91.  Pt entered/exited the pool via stairs independently with bilat rail in step-to pattern. * unsupported walking forward  * holding yellow hand floats - walking backwards, side stepping, marching * side stepping with arm abdct/ addct * holding wall:  hip abdct/ addct x 10; leg swings into hip flex/ext (straight knee);  heel/toe raises x 10;  single leg clams x 10 each LE;  relaxed squats with vertical trunk x 5; side lunge for adductor stretch (min stretch felt) * quad stretch with foot on 2nd step, 15s x 2, each LE  * back against wall:  3 way LE stretch (ITB, adductor, hamstring) 15 sec each position, each LE * when dried off: applied reg Rock tape to medial Rt knee at joint line (superior to inferior, curving in towards tibial tuberosity) with knee in flexed position and perpendicular strip at joint line.  Instructed pt on safe tape removal and wear time; pt verbalized understanding    PATIENT EDUCATION:  Education details: Hydrographic surveyor   Person educated: Patient Education method: Explanation,  Education comprehension: verbalized understanding   HOME EXERCISE PROGRAM: Access Code: [email or web address] URL: [email or web address] Date: 06/23/2022 Prepared by: Harrie Foreman  Exercises - Supine Quadricep Sets  - 1 x daily - 7 x weekly - 1-2 sets - 15 reps - 5 second hold -  Supine Knee Extension  Strengthening  - 1 x daily - 7 x weekly - 1-2 sets - 10 reps - 5 sec hold - Supine Active Straight Leg Raise (Mirrored)  - 1 x daily - 7 x weekly - 1-2 sets - 10 reps - Seated Hamstring Stretch  - 2 x daily - 7 x weekly - 1 sets - 3 reps - 30-60 sec hold - Quadricep Stretch with Chair and Counter Support  - 2 x daily - 7 x weekly - 1 sets - 3 reps - 30-60 sec hold - Standing Gastroc Stretch  - 2 x daily - 7 x weekly - 3 reps - 30s hold - Standing Soleus Stretch (Mirrored)  - 2 x daily - 7 x weekly - 3 reps - 30s hold - Prone Knee Flexion  - 1 x daily - 7 x weekly - 2 sets - 10 reps - Prone Quadriceps Stretch with Strap  - 1 x daily - 7 x weekly - 1 sets - 2 reps - 1 month  hold - Prone Hip Extension with Bent Knee  - 1 x daily - 7 x weekly - 2 sets - 10 reps - Supine Bridge with Resistance Band  - 1 x daily - 7 x weekly - 2 sets - 10 reps - Supine Bridge with Mini Swiss Ball Between Knees  - 1 x daily - 7 x weekly - 2 sets - 10 reps - Hooklying Clamshell with Resistance  - 1 x daily - 7 x weekly - 2 sets - 10 reps - Clamshell with Resistance  - 1 x daily - 7 x weekly - 2 sets - 10 reps - Supine Bridge  - 1 x daily - 7 x weekly - 2 sets - 10 reps - Prone Knee Flexion  - 1 x daily - 7 x weekly - 3 sets - 10 reps - Seated Hamstring Stretch  - 1 x daily - 7 x weekly - 1 sets - 3 reps - 30 sec  hold - Forward T with Counter Support  - 1 x daily - 7 x weekly - 3 sets - 10 reps - Standing Hamstring Curl with Chair Support  - 1 x daily - 7 x weekly - 3 sets - 10 reps  Aquatic Access Code: [email or web address] URL: [email or web address] Date: 06/25/2022 Prepared by: [facility] - [facility] - [address] * not issued yet  ASSESSMENT:  CLINICAL IMPRESSION: Pt required minor cues to allow Rt knee to bend while walking in the water.  She demonstrated decreased balance in Rt SLS with dynamic Lt motions; would benefit from more balance exercises in aquatic HEP.  Pt continues to demonstrate  potential for improvement and would benefit from continued skilled therapy to address impairments. Land  Therapist to consolidate and update HE, as well as asses goals.Marland Kitchen  POC ending next week; land therapist to determine if more sessions needed vs readiness to d/c.  Pt to check out local pools in the coming days.  Next aquatic visit, will issue laminated aquatic HEP and review.       OBJECTIVE IMPAIRMENTS: decreased activity tolerance, decreased balance, difficulty walking, decreased ROM, decreased strength, increased muscle spasms, impaired flexibility, and pain.   ACTIVITY LIMITATIONS: sitting, standing, squatting, stairs, bed mobility, and locomotion level  PARTICIPATION LIMITATIONS: meal prep, cleaning, driving, and shopping  PERSONAL FACTORS: Transportation and 3+ comorbidities: DM, CKD, HTN, LBP, HOH, HA  are also affecting patient's functional outcome.   REHAB POTENTIAL: Good  CLINICAL DECISION  MAKING: Stable/uncomplicated  EVALUATION COMPLEXITY: Low   SHORT TERM GOALS: Target date: 05/22/2022    Ind with initial HEP Baseline: Goal status: MET 05/16/22-reviewed. 05/21/22- met  2.  Assess DGI and by 2nd land visit Baseline:  Goal status: MET   LONG TERM GOALS: Target date: 07/03/2022   Ind with advanced HEP and its progression Baseline:  Goal status: IN PROGRESS  2. Improved R knee ROM to 0-125 deg to normalize gait and functional mobility Baseline:  Goal status: IN PROGRESS  3.  Improved LE strength as evidenced by decrease 5XSTS by >= 2.3 sec  Baseline:  Goal status: MET 06/18/22 12.92 sec  4.  Able to safely amb 600 feet with knee pain <= 4/10 to improve access to community. Baseline: 6/10 Goal status: MET  5.  Decreased pain in the R knee by >=75% with ADLs to improve QOL. Baseline:  Goal status: IN PROGRESS 06/18/22 50% improvement  6.  Pt to demonstrate >= 22/24 on the DGI to decrease fall risk. Baseline: 19/24 Goal status: IN PROGRESS  7.  Improved FOTO  to 44 showing functional improvement Baseline: 31 Goal status: MET  06/18/22 59    PLAN:  PT FREQUENCY: 2x/week  PT DURATION: 8 weeks  PLANNED INTERVENTIONS: Therapeutic exercises, Therapeutic activity, Neuromuscular re-education, Balance training, Gait training, Patient/Family education, Self Care, Joint mobilization, Stair training, Aquatic Therapy, Dry Needling, Electrical stimulation, Spinal mobilization, Cryotherapy, Moist heat, Taping, Ultrasound, and Manual therapy  PLAN FOR NEXT SESSION: Assess goals and readiness to d/c.    Mayer Camel, PTA 06/25/22 11:01 AM [facility] [address] [place], [place], [address] Phone: [phone]   Fax:  [phone]

## 2022-06-27 ENCOUNTER — Encounter: Payer: Self-pay | Admitting: Internal Medicine

## 2022-06-27 ENCOUNTER — Ambulatory Visit (INDEPENDENT_AMBULATORY_CARE_PROVIDER_SITE_OTHER): Payer: Medicare HMO | Admitting: Internal Medicine

## 2022-06-27 VITALS — BP 120/80 | HR 90 | Ht 65.0 in | Wt 188.0 lb

## 2022-06-27 DIAGNOSIS — E1159 Type 2 diabetes mellitus with other circulatory complications: Secondary | ICD-10-CM | POA: Diagnosis not present

## 2022-06-27 DIAGNOSIS — E1122 Type 2 diabetes mellitus with diabetic chronic kidney disease: Secondary | ICD-10-CM | POA: Diagnosis not present

## 2022-06-27 DIAGNOSIS — Z794 Long term (current) use of insulin: Secondary | ICD-10-CM

## 2022-06-27 DIAGNOSIS — N184 Chronic kidney disease, stage 4 (severe): Secondary | ICD-10-CM | POA: Diagnosis not present

## 2022-06-27 DIAGNOSIS — E1142 Type 2 diabetes mellitus with diabetic polyneuropathy: Secondary | ICD-10-CM | POA: Diagnosis not present

## 2022-06-27 LAB — POCT GLYCOSYLATED HEMOGLOBIN (HGB A1C): Hemoglobin A1C: 7.3 % — AB (ref 4.0–5.6)

## 2022-06-27 MED ORDER — FREESTYLE LIBRE 3 SENSOR MISC: 1.0000 | Freq: Every day | 3 refills | Status: AC

## 2022-06-27 MED ORDER — SEMAGLUTIDE (1 MG/DOSE) 4 MG/3ML ~~LOC~~ SOPN: 1.0000 mg | PEN_INJECTOR | SUBCUTANEOUS | 3 refills | Status: DC

## 2022-06-27 NOTE — Progress Notes (Signed)
Name: Hayley Case  Age/ Sex: 72 y.o., female   MRN/ DOB: [id number], 06/12/1950     PCP: Olive Bass, FNP   Reason for Endocrinology Evaluation: Type 2 Diabetes Mellitus  Initial Endocrine Consultative Visit: 11/30/2018    PATIENT IDENTIFIER: Hayley Case is a 72 y.o. female with a past medical history of HTN, T2DM, PAD, coronary artery disease and Dyslipidemia . The patient has followed with Endocrinology clinic since 11/30/2018 for consultative assistance with management of her diabetes.  DIABETIC HISTORY:  Hayley Case was diagnosed with T2DM > 20 yrs ago. She was on metformin, Trulicity, and Byetta. Has been on insulin since ~ 2010.  Her hemoglobin A1c has ranged from 7.6 % in 2019, peaking at 10.7 % in 2020.     On her initial visit to our clinic, she had an A1c of 11.8%. She was on lantus/humalog but was not taking regularly, switched to insulin mix for ease of take.    Was on Lipitor but caused elevated LFT's requiring hospitalization.   Moved from [place] to help her son who is on peritoneal dialysis. Has lost another son to MI    By 06/2019 we attempted to put her on the V-Go but was cost prohibitive.   By 09/2019 we switched insulin mix to MDi regimen due to persistent hyperglycemia  SUBJECTIVE:   During the last visit (12/20/2021): A1c 7.1 %   Today (06/27/2022): Hayley Case is here for a follow up on diabetes.  She checks her blood sugars multiple times daily,through CGM. The patient has not had hypoglycemic episodes since the last clinic visit.      She is undergoing physical therapy for right knee She continues to follow up with behavioral health  Son continues with dialysis, but now at the center not home anymore   Denies nausea  Has vomiting with cough  Has chronic constipation , takes colace   HOME DIABETES REGIMEN:  Toujeo 22 units once daily Humalog 12 units with each meal Rybelsus 14 mg 1 tablet with breakfast CF: Humalog (BG  -130/30)      Statin: Off due to elevated LFT's and myalgias  ACE-I/ARB: Yes     CONTINUOUS GLUCOSE MONITORING RECORD INTERPRETATION    Dates of Recording: 5/31-6/13/2024  Sensor description:freestyle libre  Results statistics:   CGM use % of time 20  Average and SD 140/37.9  Time in range  76 %  % Time Above 180 19  % Time above 250 5  % Time Below target 0    Glycemic patterns summary: BG's trend down during the night , and increase during the day  Hyperglycemic episodes  postprandial   Hypoglycemic episodes occurred  at night   Overnight periods: Trends down         DIABETIC COMPLICATIONS: Microvascular complications:  CKD III, DR , neuropathy  Last eye exam: Completed 06/2021   Macrovascular complications:   PVD Denies: CAD, CVA   HISTORY:  Past Medical History:  Past Medical History:  Diagnosis Date   CKD (chronic kidney disease)    Diabetes mellitus without complication (HCC)    Hyperlipidemia    Hypertension    Past Surgical History:  Past Surgical History:  Procedure Laterality Date   BREAST BIOPSY     BREAST EXCISIONAL BIOPSY Left    Social History:  reports that she has never smoked. She has never used smokeless tobacco. She reports that she does not drink alcohol and does not use drugs.  Family History:  Family History  Problem Relation Age of Onset   Skin cancer Mother    Heart attack Father    Colon cancer Sister      HOME MEDICATIONS: Allergies as of 06/27/2022       Reactions   Lisinopril Cough   Atorvastatin Other (See Comments)   Liver function test elevation Other reaction(s): liver issues   Penicillins Hives, Itching   Did it involve swelling of the face/tongue/throat, SOB, or low BP? No Did it involve sudden or severe rash/hives, skin peeling, or any reaction on the inside of your mouth or nose? Yes Did you need to seek medical attention at a hospital or doctor's office? Yes When did it last happen?      2012 If  all above answers are "NO", may proceed with cephalosporin use.   Rosuvastatin Other (See Comments)   Myalgias / leg cramps   Sulfamethoxazole-trimethoprim Hives   Other reaction(s): Unknown   Penicillin G    Other reaction(s): Unknown        Medication List        Accurate as of June 27, 2022  9:13 AM. If you have any questions, ask your nurse or doctor.          ACETAMINOPHEN PO Take 650 mg by mouth every 6 (six) hours as needed for headache or moderate pain.   albuterol 108 (90 Base) MCG/ACT inhaler Commonly known as: VENTOLIN HFA Inhale 2 puffs into the lungs every 6 (six) hours as needed for wheezing or shortness of breath.   allopurinol 100 MG tablet Commonly known as: ZYLOPRIM TAKE 1 TABLET BY MOUTH EVERY DAY   amLODipine 10 MG tablet Commonly known as: NORVASC TAKE 1 TABLET BY MOUTH EVERY DAY   Biotin 5000 MCG Caps Take 1 capsule by mouth daily.   COLACE PO Take 1 capsule by mouth daily.   cyanocobalamin 1000 MCG tablet Commonly known as: VITAMIN B12 Take 1,000 mcg by mouth daily.   ezetimibe 10 MG tablet Commonly known as: ZETIA TAKE 1 TABLET BY MOUTH EVERY DAY   fluocinonide 0.05 % external solution Commonly known as: LIDEX Apply topically in the morning and at bedtime.   FreeStyle Libre 2 Sensor Misc Inject 1 Device into the skin as directed. Use as directed every 14 days. E11.21   loratadine 10 MG tablet Commonly known as: CLARITIN Take 10 mg by mouth daily as needed (seasonal allergies).   losartan 50 MG tablet Commonly known as: COZAAR Take 1 tablet (50 mg total) by mouth daily.   NovoLOG FlexPen 100 UNIT/ML FlexPen Generic drug: insulin aspart Max daily 70 units   Pen Needles 32G X 4 MM Misc 1 Device by Does not apply route in the morning, at noon, in the evening, and at bedtime.   Repatha SureClick 140 MG/ML Soaj Generic drug: Evolocumab Inject 140 mg into the skin every 14 (fourteen) days.   Rybelsus 14 MG Tabs Generic  drug: Semaglutide Take 1 tablet (14 mg total) by mouth daily.   SYSTANE FREE OP Apply 1 drop to eye as needed.   Toujeo Max SoloStar 300 UNIT/ML Solostar Pen Generic drug: insulin glargine (2 Unit Dial) Inject 22 Units into the skin daily at 6 (six) AM. What changed: additional instructions   Vitamin D 50 MCG (2000 UT) Caps Take 4,000 Units by mouth daily.         OBJECTIVE:   Vital Signs: BP 120/80 (BP Location: Left Arm, Patient Position: Sitting, Cuff Size:  Large)   Pulse 90   Ht 5\' 5"  (1.651 m)   Wt 188 lb (85.3 kg)   SpO2 98%   BMI 31.28 kg/m   Wt Readings from Last 3 Encounters:  06/27/22 188 lb (85.3 kg)  04/28/22 190 lb (86.2 kg)  04/24/22 189 lb (85.7 kg)     Exam: General: Pt appears well and is in NAD  Lungs: Clear with good BS bilat  Heart: RRR   Extremities: No  pretibial edema.  Neuro: MS is good with appropriate affect, pt is alert and Ox3       DM foot exam: 06/27/2022   The skin of the feet is intact without sores or ulcerations. The pedal pulses are decreased The sensation is decreased to a screening 5.07, 10 gram monofilament bilaterally     DATA REVIEWED:  Lab Results  Component Value Date   HGBA1C 7.3 (A) 06/27/2022   HGBA1C 7.1 (A) 12/20/2021   HGBA1C 7.4 (A) 07/02/2021     Latest Reference Range & Units 09/18/20 09:53  Sodium 135 - 145 mEq/L 141  Potassium 3.5 - 5.1 mEq/L 4.2  Chloride 96 - 112 mEq/L 106  CO2 19 - 32 mEq/L 26  Glucose 70 - 99 mg/dL 97  BUN 6 - 23 mg/dL 31 (H)  Creatinine 2.95 - 1.20 mg/dL 6.21 (H)  Calcium 8.4 - 10.5 mg/dL 9.3  Alkaline Phosphatase 39 - 117 U/L 105  Albumin 3.5 - 5.2 g/dL 3.6  AST 0 - 37 U/L 13  ALT 0 - 35 U/L 13  Total Protein 6.0 - 8.3 g/dL 6.8  Total Bilirubin 0.2 - 1.2 mg/dL 0.6  GFR >30.86 mL/min 25.33 (L)    ASSESSMENT / PLAN / RECOMMENDATIONS:   1) Type 2 Diabetes Mellitus, Optimally controlled, With CKD IV, retinopathy ,neuropathic  and macrovascular complications - Most  recent A1c of 7.3%. Goal A1c < 7.5 %.     - We have attempted to put her on the V-go but that was cost prohibitive - She in inconsistent with novolog intake  - Will reduce toujeo due to tight BG's overnight  -I have recommended switching Rybelsus to injectable Ozempic as below -Due to concerns regarding the price, she was given a printed prescription of Ozempic to compare price versus Rybelsus, if the price is the same she will switch to Ozempic, but if the price is high she will remain on Rybelsus  MEDICATIONS: -Stop Rybelsus -Start Ozempic 1 mg weekly - Decreased Toujeo to 18 units once daily -Take Novolog 12 units with each meal -Continue  Rybelsus 14 mg 1 tablet with breakfast - CF: Humalog (BG -130/30)     EDUCATION / INSTRUCTIONS: BG monitoring instructions: Patient is instructed to check her blood sugars 3 times a day, before meals I reviewed the Rule of 15 for the treatment of hypoglycemia in detail with the patient. Literature supplied.     F/U in 6 months    Signed electronically by: Lyndle Herrlich, MD  [facility] Endocrinology  [facility] [address] [place] [place], [place] [address] Phone: [phone] FAX: [phone]   CC: Olive Bass, FNP [address] [place] [address] Phone: [phone]  Fax: [phone]  Return to Endocrinology clinic as below: Future Appointments  Date Time Provider Department Center  06/27/2022  9:30 AM Aaryav Hopfensperger, Konrad Dolores, MD [facility] None  06/30/2022  8:45 AM Riddles, Enrique Sack, PT [facility]  07/02/2022  8:15 AM Carlson-Long, Lise Auer  [facility]  07/21/2022 11:15 AM [facility] PHARMACIST [facility]  08/19/2022  8:40 AM O'Neal, Ronnald Ramp, MD [facility] None  08/21/2022 11:00 AM Bauert, Rella Larve, LCSW [facility] None  09/16/2022 11:00 AM Bauert, Rella Larve, LCSW [facility] None

## 2022-06-27 NOTE — Patient Instructions (Addendum)
-   Will switch Rybelsus to Ozempic 1 mg, once weekly - Decrease Toujeo to 18 units once daily  - Take Humalog 12  units with each meal   -Humalog correctional insulin: ADD extra units on insulin to your meal-time Humalog dose if your blood sugars are higher than 160. Use the scale below to help guide you:   Blood sugar before meal Number of units to inject  Less than 160 0 unit  161 -  190 1 units  191 -  220 2 units  221 -  250 3 units  251 -  280 4 units  281 -  310 5 units  311 -  340 6 units  341 -  370 7 units  371 -  400 8 units  401 - 430 9 units        HOW TO TREAT LOW BLOOD SUGARS (Blood sugar LESS THAN 70 MG/DL) Please follow the RULE OF 15 for the treatment of hypoglycemia treatment (when your (blood sugars are less than 70 mg/dL)   STEP 1: Take 15 grams of carbohydrates when your blood sugar is low, which includes:  3-4 GLUCOSE TABS  OR 3-4 OZ OF JUICE OR REGULAR SODA OR ONE TUBE OF GLUCOSE GEL    STEP 2: RECHECK blood sugar in 15 MINUTES STEP 3: If your blood sugar is still low at the 15 minute recheck --> then, go back to STEP 1 and treat AGAIN with another 15 grams of carbohydrates.

## 2022-06-29 NOTE — Therapy (Signed)
OUTPATIENT PHYSICAL THERAPY TREATMENT   Patient Name: Hayley Case MRN: [id number] DOB:January 14, 1951, 72 y.o., female Today's Date: 06/30/2022  END OF SESSION:  PT End of Session - 06/30/22 0845     Visit Number 13    Date for PT Re-Evaluation 07/03/22    Authorization Type [employer] MCR    PT Start Time 0845    PT Stop Time 0928    PT Time Calculation (min) 43 min    Activity Tolerance Patient tolerated treatment well    Behavior During Therapy WFL for tasks assessed/performed                Past Medical History:  Diagnosis Date   CKD (chronic kidney disease)    Diabetes mellitus without complication (HCC)    Hyperlipidemia    Hypertension    Past Surgical History:  Procedure Laterality Date   BREAST BIOPSY     BREAST EXCISIONAL BIOPSY Left    Patient Active Problem List   Diagnosis Date Noted   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 04/21/2022   Primary osteoarthritis of right knee 03/31/2022   Type 2 diabetes mellitus with stage 4 chronic kidney disease, with long-term current use of insulin (HCC) 07/02/2021   Arthritis of hip 04/18/2021   Background diabetic retinopathy (HCC) 04/18/2021   Chronic kidney disease, stage 3b (HCC) 04/18/2021   Dyslipidemia 04/18/2021   Family history of malignant neoplasm of digestive organs 04/18/2021   Gouty arthritis 04/18/2021   Hearing loss 04/18/2021   Hyperglycemia due to type 2 diabetes mellitus (HCC) 04/18/2021   Long term (current) use of insulin (HCC) 04/18/2021   Obstructive sleep apnea (adult) (pediatric) 04/18/2021   Peripheral arterial disease (HCC) 04/18/2021   Personal history of colonic polyps 04/18/2021   Flank pain 10/14/2019   Type 2 diabetes mellitus with diabetic polyneuropathy, with long-term current use of insulin (HCC) 12/01/2018   Type 2 diabetes mellitus with retinopathy, with long-term current use of insulin (HCC) 12/01/2018   Type 2 diabetes mellitus with stage 3b chronic kidney disease, with  long-term current use of insulin (HCC) 11/30/2018   Non-intractable vomiting    Acute hepatitis 07/13/2018   Acute on chronic renal insufficiency 07/12/2018   Diabetes mellitus (HCC) 12/08/2016   Essential hypertension 12/08/2016   Hyperlipidemia associated with type 2 diabetes mellitus (HCC) 12/08/2016    PCP: Olive Bass, FNP   REFERRING PROVIDER: Lenda Kelp, MD   REFERRING DIAG: M17.11 (ICD-10-CM) - Primary osteoarthritis of right knee   THERAPY DIAG:  Chronic pain of right knee  Stiffness of right knee, not elsewhere classified  Unsteadiness on feet  Cramp and spasm  Other abnormalities of gait and mobility  Rationale for Evaluation and Treatment: Rehabilitation  ONSET DATE: last month or so  SUBJECTIVE:   SUBJECTIVE STATEMENT: I've noticed that the shoes I wear affects how much pain my knee is   PERTINENT HISTORY: DM, CKD, HTN, LBP, HOH, HA PAIN:  Are you having pain? Yes: NPRS scale: 3/10 Pain location: R knee ant and posterior  Pain description: intense sharp Aggravating factors: prolonged sitting, standing and walking Relieving factors: Tylenol, ice, reclining   PRECAUTIONS: None  WEIGHT BEARING RESTRICTIONS: No  FALLS:  Has patient fallen in last 6 months? No  LIVING ENVIRONMENT: Lives with: lives with their son Lives in: House/apartment Stairs: No Has following equipment at home: Single point cane  OCCUPATION: retired  PLOF: Independent  PATIENT GOALS: to find a way to tolerate the pain  NEXT MD VISIT: waiting to schedule gel injections  OBJECTIVE:   DIAGNOSTIC FINDINGS:  XR IMPRESSION: 1. Patellofemoral degenerative changes. 2. Calcified atherosclerotic changes in the femoral vessels.  [place]: degenerative changes in R knee  PATIENT SURVEYS:  FOTO 31 (44 predicted) ;  06/18/22 FOTO 59  COGNITION: Overall cognitive status: Within functional limits for tasks assessed     SENSATION: Neuropathy in B hands and  feet   MUSCLE LENGTH: HS:Bil HS marked Quads: tight R Piriformis: WNL Hip Flexors: WNL  Heelcords: tight B   POSTURE: rounded shoulders and forward head  PALPATION: Palpation: TTP at medial knee, med patella, joint line and Patellar tendon. Also R rectus femoris, VMO and ADDuctors Patellar Mobility: painful med/lat, but WNL   LOWER EXTREMITY ROM:   A/P ROM Right eval Left eval Right 06/18/22 Right  06/30/22  Knee flexion 114/118 132 122 119  Knee extension 0 0 0 0   (Blank rows = not tested)  LOWER EXTREMITY MMT: 5/5  FUNCTIONAL TESTS:  5 times sit to stand: 19.66 sec 06/18/22 12.92 sec 6 minute walk test: 894 ft pain 6/10  05/12/22 Dynamic Gait Index: 19/24  05/12/22  23/24 on 06/30/22 Berg Balance Assessment: 51/56 05/12/22  GAIT: Distance walked: 20 Assistive device utilized: None Level of assistance: Complete Independence Comments: No significant gait deviations, but further functional gait assessments shoulder be completed   TODAY'S TREATMENT:                                                                                                                              DATE:    06/30/22 Therapeutic Exercise: to improve strength and mobility.  Demo, verbal and tactile cues throughout for technique.  Bike L2 x 6 min Tandem stance with head turns SLS L max 12 sec R 3 sec Reviewed HEP -printed larger print per pt request  Therapeutic Activiity: Goals assessed; ROM, DGI  Self care: Discussed shoes/arch supports and where to find affordable options Discussed standing with full WB vs on balls of feet while cooking or varying stance to semi tandem stance to decrease strain on HS  06/25/22 Pt seen for aquatic therapy today.  Treatment took place in water 3.5-4. ft in depth at the [facility] pool. Temp of water was 91.  Pt entered/exited the pool via stairs independently with bilat rail in step-to pattern. * unsupported walking forward / backward, marching  *  holding rainbow hand floats under water (bilat) at side / unilateral yellow hand float - for increased core engagement * side stepping with arm abdct/ addct with yellow-> rainbow - no hand floats x 3 laps * holding noodle / yellow hand floats: leg swings into hip flex/ext (straight knee) 3 x 5; hip abdct/ addct 3 x 5;  alternating hamstring curls x 10; heel/toe raises x 5 - switched to holding wall   * holding wall:  heel/toe raises x 10;  single leg clams x 10 each LE;  *  TrA set with blue noodle pull down to thighs x 10; repeated with single/double rainbow hand floats * Staggered stance with kick board row x 10 each LE   06/23/22 Therapeutic Exercise: to improve strength and mobility.  Demo, verbal and tactile cues throughout for technique.  Nustep L5 x 6 min  HS stretches seated 3 x 30 sec Forward T's at counter 2 x 10 Prone knee bends 3 x 10 Review and Update of HEP Self Care: Self STM with massage stick Manual Therapy: to decrease muscle spasm and pain and improve mobility STM/TPR to R hamstring Application of K-tape to R anterior knee and R hamstring.    PATIENT EDUCATION:  Education details: Hydrographic surveyor   Person educated: Patient Education method: Explanation,  Education comprehension: verbalized understanding   HOME EXERCISE PROGRAM: Access Code: [email or web address] URL: [email or web address] Date: 06/30/2022 Prepared by: Raynelle Fanning  Exercises - Supine Quadricep Sets  - 1 x daily - 7 x weekly - 1-2 sets - 15 reps - 5 second hold - Supine Knee Extension Strengthening  - 1 x daily - 7 x weekly - 1-2 sets - 10 reps - 5 sec hold - Supine Active Straight Leg Raise (Mirrored)  - 1 x daily - 7 x weekly - 1-2 sets - 10 reps - Seated Hamstring Stretch  - 2 x daily - 7 x weekly - 1 sets - 3 reps - 30-60 sec hold - Quadricep Stretch with Chair and Counter Support  - 2 x daily - 7 x weekly - 1 sets - 3 reps - 30-60 sec hold - Standing Gastroc Stretch  - 2 x daily  - 7 x weekly - 3 reps - 30s hold - Standing Soleus Stretch (Mirrored)  - 2 x daily - 7 x weekly - 3 reps - 30s hold - Prone Knee Flexion  - 1 x daily - 7 x weekly - 2 sets - 10 reps - Prone Hip Extension with Bent Knee  - 1 x daily - 7 x weekly - 2 sets - 10 reps - Supine Bridge with Resistance Band  - 1 x daily - 7 x weekly - 2 sets - 10 reps - Supine Bridge with Mini Swiss Ball Between Knees  - 1 x daily - 7 x weekly - 2 sets - 10 reps - Hooklying Clamshell with Resistance  - 1 x daily - 7 x weekly - 2 sets - 10 reps - Clamshell with Resistance  - 1 x daily - 7 x weekly - 2 sets - 10 reps - Forward T with Counter Support  - 1 x daily - 7 x weekly - 3 sets - 10 reps - Standing Hamstring Curl with Chair Support  - 1 x daily - 7 x weekly - 3 sets - 10 reps  ASSESSMENT:  CLINICAL IMPRESSION: Hayley Case "Hayley Case" reports 50% or more improvement in her R knee pain since starting PT. Her HS is much better, but she still feels it with active motion. We discussed body mechanics when standing to minimize strain on the HS. She feels her shoes play significant role in her knee pain and standing/walking tolerance. We discussed shoe options and where to obtain affordable shoes. She met her DGI goal today and demonstrated no LOB or deviation with speed transitions or turns. She is still challenged with SLS on the R>L. PT encouraged pt to continue to work on active quad stretching as her ROM was less today than at last assessment. However, this decrease  in range may have more to do with her HS strain than quad flexibility.  Hayley Case has one more aquatic PT session. Due to her HS still causing some pain in the knee, I plan to put the pt on a 30 day hold after that visit in the event it flares up again. If she does not return during this time, she will be discharged from PT.   OBJECTIVE IMPAIRMENTS: decreased activity tolerance, decreased balance, difficulty walking, decreased ROM, decreased strength, increased  muscle spasms, impaired flexibility, and pain.   ACTIVITY LIMITATIONS: sitting, standing, squatting, stairs, bed mobility, and locomotion level  PARTICIPATION LIMITATIONS: meal prep, cleaning, driving, and shopping  PERSONAL FACTORS: Transportation and 3+ comorbidities: DM, CKD, HTN, LBP, HOH, HA  are also affecting patient's functional outcome.   REHAB POTENTIAL: Good  CLINICAL DECISION MAKING: Stable/uncomplicated  EVALUATION COMPLEXITY: Low   SHORT TERM GOALS: Target date: 05/22/2022    Ind with initial HEP Baseline: Goal status: MET 05/16/22-reviewed. 05/21/22- met  2.  Assess DGI and by 2nd land visit Baseline:  Goal status: MET   LONG TERM GOALS: Target date: 07/03/2022   Ind with advanced HEP and its progression Baseline:  Goal status: MET  2. Improved R knee ROM to 0-125 deg to normalize gait and functional mobility Baseline:  Goal status: MET  3.  Improved LE strength as evidenced by decrease 5XSTS by >= 2.3 sec  Baseline:  Goal status: MET 06/18/22 12.92 sec  4.  Able to safely amb 600 feet with knee pain <= 4/10 to improve access to community. Baseline: 6/10 Goal status: MET  5.  Decreased pain in the R knee by >=75% with ADLs to improve QOL. Baseline:  Goal status: NOT MET 06/30/22 50% or more improvement  6.  Pt to demonstrate >= 22/24 on the DGI to decrease fall risk. Baseline: 19/24 Goal status: MET 23/24 on 06/30/22  7.  Improved FOTO to 44 showing functional improvement Baseline: 31 Goal status: MET  06/18/22 59    PLAN:  PT FREQUENCY: 2x/week  PT DURATION: 8 weeks  PLANNED INTERVENTIONS: Therapeutic exercises, Therapeutic activity, Neuromuscular re-education, Balance training, Gait training, Patient/Family education, Self Care, Joint mobilization, Stair training, Aquatic Therapy, Dry Needling, Electrical stimulation, Spinal mobilization, Cryotherapy, Moist heat, Taping, Ultrasound, and Manual therapy  PLAN FOR NEXT SESSION: complete last  aquatic session then pt will be put on hold for 30 days.  Solon Palm, PT 06/30/22 9:53 AM [facility] [address] [place], [place] [address] [phone]  Fax: [phone]

## 2022-06-30 ENCOUNTER — Encounter: Payer: Self-pay | Admitting: Physical Therapy

## 2022-06-30 ENCOUNTER — Ambulatory Visit: Payer: Medicare HMO | Admitting: Physical Therapy

## 2022-06-30 DIAGNOSIS — R2689 Other abnormalities of gait and mobility: Secondary | ICD-10-CM

## 2022-06-30 DIAGNOSIS — M25561 Pain in right knee: Secondary | ICD-10-CM | POA: Diagnosis not present

## 2022-06-30 DIAGNOSIS — G8929 Other chronic pain: Secondary | ICD-10-CM

## 2022-06-30 DIAGNOSIS — R2681 Unsteadiness on feet: Secondary | ICD-10-CM

## 2022-06-30 DIAGNOSIS — M25661 Stiffness of right knee, not elsewhere classified: Secondary | ICD-10-CM

## 2022-06-30 DIAGNOSIS — R252 Cramp and spasm: Secondary | ICD-10-CM

## 2022-07-02 ENCOUNTER — Ambulatory Visit (HOSPITAL_BASED_OUTPATIENT_CLINIC_OR_DEPARTMENT_OTHER): Payer: Medicare HMO | Admitting: Physical Therapy

## 2022-07-02 ENCOUNTER — Encounter (HOSPITAL_BASED_OUTPATIENT_CLINIC_OR_DEPARTMENT_OTHER): Payer: Self-pay | Admitting: Physical Therapy

## 2022-07-02 DIAGNOSIS — M25661 Stiffness of right knee, not elsewhere classified: Secondary | ICD-10-CM

## 2022-07-02 DIAGNOSIS — R2681 Unsteadiness on feet: Secondary | ICD-10-CM

## 2022-07-02 DIAGNOSIS — M25561 Pain in right knee: Secondary | ICD-10-CM | POA: Diagnosis not present

## 2022-07-02 DIAGNOSIS — G8929 Other chronic pain: Secondary | ICD-10-CM

## 2022-07-02 NOTE — Therapy (Signed)
OUTPATIENT PHYSICAL THERAPY TREATMENT   Patient Name: Hayley Case MRN: [id number] DOB:24-Feb-1950, 72 y.o., female Today's Date: 07/02/2022  END OF SESSION:  PT End of Session - 07/02/22 0819     Visit Number 14    Date for PT Re-Evaluation 07/03/22    Authorization Type AETNA MCR    PT Start Time 0811    PT Stop Time 0853    PT Time Calculation (min) 42 min    Activity Tolerance Patient tolerated treatment well    Behavior During Therapy WFL for tasks assessed/performed                Past Medical History:  Diagnosis Date   CKD (chronic kidney disease)    Diabetes mellitus without complication (HCC)    Hyperlipidemia    Hypertension    Past Surgical History:  Procedure Laterality Date   BREAST BIOPSY     BREAST EXCISIONAL BIOPSY Left    Patient Active Problem List   Diagnosis Date Noted   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 04/21/2022   Primary osteoarthritis of right knee 03/31/2022   Type 2 diabetes mellitus with stage 4 chronic kidney disease, with long-term current use of insulin (HCC) 07/02/2021   Arthritis of hip 04/18/2021   Background diabetic retinopathy (HCC) 04/18/2021   Chronic kidney disease, stage 3b (HCC) 04/18/2021   Dyslipidemia 04/18/2021   Family history of malignant neoplasm of digestive organs 04/18/2021   Gouty arthritis 04/18/2021   Hearing loss 04/18/2021   Hyperglycemia due to type 2 diabetes mellitus (HCC) 04/18/2021   Long term (current) use of insulin (HCC) 04/18/2021   Obstructive sleep apnea (adult) (pediatric) 04/18/2021   Peripheral arterial disease (HCC) 04/18/2021   Personal history of colonic polyps 04/18/2021   Flank pain 10/14/2019   Type 2 diabetes mellitus with diabetic polyneuropathy, with long-term current use of insulin (HCC) 12/01/2018   Type 2 diabetes mellitus with retinopathy, with long-term current use of insulin (HCC) 12/01/2018   Type 2 diabetes mellitus with stage 3b chronic kidney disease, with  long-term current use of insulin (HCC) 11/30/2018   Non-intractable vomiting    Acute hepatitis 07/13/2018   Acute on chronic renal insufficiency 07/12/2018   Diabetes mellitus (HCC) 12/08/2016   Essential hypertension 12/08/2016   Hyperlipidemia associated with type 2 diabetes mellitus (HCC) 12/08/2016    PCP: Olive Bass, FNP   REFERRING PROVIDER: Myra Rude, MD   REFERRING DIAG: M17.11 (ICD-10-CM) - Primary osteoarthritis of right knee   THERAPY DIAG:  Chronic pain of right knee  Stiffness of right knee, not elsewhere classified  Unsteadiness on feet  Rationale for Evaluation and Treatment: Rehabilitation  ONSET DATE: last month or so  SUBJECTIVE:   SUBJECTIVE STATEMENT: Pt reports that her Rt knee pain is much better than when she started PT.  She plans to go to [facility] today or tomorrow. She has gotten her silver sneakers card    PERTINENT HISTORY: DM, CKD, HTN, LBP, HOH, HA PAIN:  Are you having pain? Yes: NPRS scale: 2/10 Pain location: R knee ant and posterior  Pain description: intense sharp Aggravating factors: prolonged sitting, standing and walking Relieving factors: Tylenol, ice, reclining   PRECAUTIONS: None  WEIGHT BEARING RESTRICTIONS: No  FALLS:  Has patient fallen in last 6 months? No  LIVING ENVIRONMENT: Lives with: lives with their son Lives in: House/apartment Stairs: No Has following equipment at home: Single point cane  OCCUPATION: retired  PLOF: Independent  PATIENT GOALS: to  find a way to tolerate the pain  NEXT MD VISIT: waiting to schedule gel injections  OBJECTIVE:   DIAGNOSTIC FINDINGS:  XR IMPRESSION: 1. Patellofemoral degenerative changes. 2. Calcified atherosclerotic changes in the femoral vessels.  [place]: degenerative changes in R knee  PATIENT SURVEYS:  FOTO 31 (44 predicted) ;  06/18/22 FOTO 59  COGNITION: Overall cognitive status: Within functional limits for tasks  assessed     SENSATION: Neuropathy in B hands and feet   MUSCLE LENGTH: HS:Bil HS marked Quads: tight R Piriformis: WNL Hip Flexors: WNL  Heelcords: tight B   POSTURE: rounded shoulders and forward head  PALPATION: Palpation: TTP at medial knee, med patella, joint line and Patellar tendon. Also R rectus femoris, VMO and ADDuctors Patellar Mobility: painful med/lat, but WNL   LOWER EXTREMITY ROM:   A/P ROM Right eval Left eval Right 06/18/22 Right  06/30/22  Knee flexion 114/118 132 122 119  Knee extension 0 0 0 0   (Blank rows = not tested)  LOWER EXTREMITY MMT: 5/5  FUNCTIONAL TESTS:  5 times sit to stand: 19.66 sec 06/18/22 12.92 sec 6 minute walk test: 894 ft pain 6/10  05/12/22 Dynamic Gait Index: 19/24  05/12/22  23/24 on 06/30/22 Berg Balance Assessment: 51/56 05/12/22  GAIT: Distance walked: 20 Assistive device utilized: None Level of assistance: Complete Independence Comments: No significant gait deviations, but further functional gait assessments shoulder be completed   TODAY'S TREATMENT:                                                                                                                              DATE:   07/02/22 Pt seen for aquatic therapy today.  Treatment took place in water 3.5-4. ft in depth at the [facility] pool. Temp of water was 91.  Pt entered/exited the pool via stairs independently with bilat rail in step-to pattern. * unsupported walking forward / backward, marching  * side stepping with arm abdct/ addct with rainbow hand floats x 2 laps * hip abdct/addct 4 sets of 5, (first 2 UE on hand floats, 2nd holding wall) * holding wall:  leg swings into hip flex/ ext, 2 sets of 5;  alternating hamstring curls x 10;  heel/toe raises x 10 - 2 rounds * staggered stance with kick board row x 10 each LE, 2 sets * holding hand floats - 3 way toe tap x 5 each LE, alternating * TrA set with single/double rainbow hand floats to thighs x 5   * quad stretch with foot on step x 15s, calf stretch with heels off of step x 15s  06/30/22 Therapeutic Exercise: to improve strength and mobility.  Demo, verbal and tactile cues throughout for technique.  Bike L2 x 6 min Tandem stance with head turns SLS L max 12 sec R 3 sec Reviewed HEP -printed larger print per pt request  Therapeutic Activiity: Goals assessed; ROM, DGI  Self care: Discussed shoes/arch supports  and where to find affordable options Discussed standing with full WB vs on balls of feet while cooking or varying stance to semi tandem stance to decrease strain on HS  06/25/22 Pt seen for aquatic therapy today.  Treatment took place in water 3.5-4. ft in depth at the [facility] pool. Temp of water was 91.  Pt entered/exited the pool via stairs independently with bilat rail in step-to pattern. * unsupported walking forward / backward, marching  * holding rainbow hand floats under water (bilat) at side / unilateral yellow hand float - for increased core engagement * side stepping with arm abdct/ addct with yellow-> rainbow - no hand floats x 3 laps * holding noodle / yellow hand floats: leg swings into hip flex/ext (straight knee) 3 x 5; hip abdct/ addct 3 x 5;  alternating hamstring curls x 10; heel/toe raises x 5 - switched to holding wall   * holding wall:  heel/toe raises x 10;  single leg clams x 10 each LE;  * TrA set with blue noodle pull down to thighs x 10; repeated with single/double rainbow hand floats * Staggered stance with kick board row x 10 each LE   06/23/22 Therapeutic Exercise: to improve strength and mobility.  Demo, verbal and tactile cues throughout for technique.  Nustep L5 x 6 min  HS stretches seated 3 x 30 sec Forward T's at counter 2 x 10 Prone knee bends 3 x 10 Review and Update of HEP Self Care: Self STM with massage stick Manual Therapy: to decrease muscle spasm and pain and improve mobility STM/TPR to R hamstring Application of  K-tape to R anterior knee and R hamstring.    PATIENT EDUCATION:  Education details: Hydrographic surveyor   Person educated: Patient Education method: Explanation,  Education comprehension: verbalized understanding   HOME EXERCISE PROGRAM: Access Code: [email or web address] URL: [email or web address] Date: 06/30/2022 Prepared by: Raynelle Fanning  Exercises - Supine Quadricep Sets  - 1 x daily - 7 x weekly - 1-2 sets - 15 reps - 5 second hold - Supine Knee Extension Strengthening  - 1 x daily - 7 x weekly - 1-2 sets - 10 reps - 5 sec hold - Supine Active Straight Leg Raise (Mirrored)  - 1 x daily - 7 x weekly - 1-2 sets - 10 reps - Seated Hamstring Stretch  - 2 x daily - 7 x weekly - 1 sets - 3 reps - 30-60 sec hold - Quadricep Stretch with Chair and Counter Support  - 2 x daily - 7 x weekly - 1 sets - 3 reps - 30-60 sec hold - Standing Gastroc Stretch  - 2 x daily - 7 x weekly - 3 reps - 30s hold - Standing Soleus Stretch (Mirrored)  - 2 x daily - 7 x weekly - 3 reps - 30s hold - Prone Knee Flexion  - 1 x daily - 7 x weekly - 2 sets - 10 reps - Prone Hip Extension with Bent Knee  - 1 x daily - 7 x weekly - 2 sets - 10 reps - Supine Bridge with Resistance Band  - 1 x daily - 7 x weekly - 2 sets - 10 reps - Supine Bridge with Mini Swiss Ball Between Knees  - 1 x daily - 7 x weekly - 2 sets - 10 reps - Hooklying Clamshell with Resistance  - 1 x daily - 7 x weekly - 2 sets - 10 reps - Clamshell with Resistance  - 1 x  daily - 7 x weekly - 2 sets - 10 reps - Forward T with Counter Support  - 1 x daily - 7 x weekly - 3 sets - 10 reps - Standing Hamstring Curl with Chair Support  - 1 x daily - 7 x weekly - 3 sets - 10 reps  ASSESSMENT:  CLINICAL IMPRESSION: Pt issued laminated aquatic HEP today and was guided through exercises.  She tolerated all exercises with only minor cues.  No increase in pain. Per PT at last session, pt on a 30 day hold after today's visit in the event it flares  up again. If she does not return during this time, she will be discharged from PT.   OBJECTIVE IMPAIRMENTS: decreased activity tolerance, decreased balance, difficulty walking, decreased ROM, decreased strength, increased muscle spasms, impaired flexibility, and pain.   ACTIVITY LIMITATIONS: sitting, standing, squatting, stairs, bed mobility, and locomotion level  PARTICIPATION LIMITATIONS: meal prep, cleaning, driving, and shopping  PERSONAL FACTORS: Transportation and 3+ comorbidities: DM, CKD, HTN, LBP, HOH, HA  are also affecting patient's functional outcome.   REHAB POTENTIAL: Good  CLINICAL DECISION MAKING: Stable/uncomplicated  EVALUATION COMPLEXITY: Low   SHORT TERM GOALS: Target date: 05/22/2022    Ind with initial HEP Baseline: Goal status: MET 05/16/22-reviewed. 05/21/22- met  2.  Assess DGI and by 2nd land visit Baseline:  Goal status: MET   LONG TERM GOALS: Target date: 07/03/2022   Ind with advanced HEP and its progression Baseline:  Goal status: MET  2. Improved R knee ROM to 0-125 deg to normalize gait and functional mobility Baseline:  Goal status: MET  3.  Improved LE strength as evidenced by decrease 5XSTS by >= 2.3 sec  Baseline:  Goal status: MET 06/18/22 12.92 sec  4.  Able to safely amb 600 feet with knee pain <= 4/10 to improve access to community. Baseline: 6/10 Goal status: MET  5.  Decreased pain in the R knee by >=75% with ADLs to improve QOL. Baseline:  Goal status: NOT MET 06/30/22 50% or more improvement  6.  Pt to demonstrate >= 22/24 on the DGI to decrease fall risk. Baseline: 19/24 Goal status: MET 23/24 on 06/30/22  7.  Improved FOTO to 44 showing functional improvement Baseline: 31 Goal status: MET  06/18/22 59    PLAN:  PT FREQUENCY: 2x/week  PT DURATION: 8 weeks  PLANNED INTERVENTIONS: Therapeutic exercises, Therapeutic activity, Neuromuscular re-education, Balance training, Gait training, Patient/Family education, Self  Care, Joint mobilization, Stair training, Aquatic Therapy, Dry Needling, Electrical stimulation, Spinal mobilization, Cryotherapy, Moist heat, Taping, Ultrasound, and Manual therapy  PLAN FOR NEXT SESSION: pt will be put on hold for 30 days.  Mayer Camel, PTA 07/02/22 8:54 AM [facility] [address] [place], [place], [address] Phone: [phone]   Fax:  [phone]

## 2022-07-03 ENCOUNTER — Encounter: Payer: Self-pay | Admitting: Family

## 2022-07-03 ENCOUNTER — Telehealth: Payer: Self-pay | Admitting: Family

## 2022-07-03 ENCOUNTER — Ambulatory Visit (INDEPENDENT_AMBULATORY_CARE_PROVIDER_SITE_OTHER): Payer: Medicare HMO | Admitting: Family

## 2022-07-03 VITALS — BP 138/86 | HR 90 | Ht 65.0 in | Wt 185.4 lb

## 2022-07-03 DIAGNOSIS — H81399 Other peripheral vertigo, unspecified ear: Secondary | ICD-10-CM

## 2022-07-03 DIAGNOSIS — R42 Dizziness and giddiness: Secondary | ICD-10-CM

## 2022-07-03 MED ORDER — MECLIZINE HCL 12.5 MG PO TABS: 12.5000 mg | ORAL_TABLET | Freq: Three times a day (TID) | ORAL | 0 refills | Status: AC | PRN

## 2022-07-03 NOTE — Telephone Encounter (Signed)
FYI: This call has been transferred to Access Nurse. Once the result note has been entered staff can address the message at that time.  Patient called in with the following symptoms:  Red Word:dizziness  and Ringing of Ears   Please advise at Mobile [phone] (mobile)  Message is routed to Provider Pool and [facility] Triage

## 2022-07-03 NOTE — Telephone Encounter (Signed)
Initial Comment Caller states that she has constant ringing in her ears and she is dizzy. Translation No Nurse Assessment Nurse: [place], RN, Marchelle Folks Date/Time (Eastern Time): 07/03/2022 9:08:37 AM Confirm and document reason for call. If symptomatic, describe symptoms. ---caller states she has constant ringing in ears for a week. this morning she had vertigo but gone now. headache just now starting. no earache and no cold recently. Does the patient have any new or worsening symptoms? ---Yes Will a triage be completed? ---Yes Related visit to physician within the last 2 weeks? ---Yes Does the PT have any chronic conditions? (i.e. diabetes, asthma, this includes High risk factors for pregnancy, etc.) ---Yes List chronic conditions. ---HTN, DM, sees cardiologist, knee pain, kidney Is this a behavioral health or substance abuse call? ---No Guidelines Guideline Title Affirmed Question Affirmed Notes Nurse Date/Time (Eastern Time) Tinnitus MODERATESEVERE tinnitus (i.e., interferes with work, school, or sleep) Humfleet, RN, Marchelle Folks 07/03/2022 9:10:57 AM Dizziness - Vertigo [1] NO dizziness now AND [2] one or more stroke risk factors Humfleet, RN, Marchelle Folks 07/03/2022 9:12:28 AM PLEASE NOTE: All timestamps contained within this report are represented as [place] Standard Time. CONFIDENTIALTY NOTICE: This fax transmission is intended only for the addressee. It contains information that is legally privileged, confidential or otherwise protected from use or disclosure. If you are not the intended recipient, you are strictly prohibited from reviewing, disclosing, copying using or disseminating any of this information or taking any action in reliance on or regarding this information. If you have received this fax in error, please notify us immediately by telephone so that we can arrange for its return to [place]. Phone: [phone], Toll-Free: [phone], Fax: [phone] Page: 2 of 2 Call  Id: [id number] Guidelines Guideline Title Affirmed Question Affirmed Notes Nurse Date/Time Lamount Cohen Time) (i.e., hypertension, diabetes, prior stroke/ TIA/heart attack) Disp. Time Lamount Cohen Time) Disposition Final User 07/03/2022 9:12:12 AM SEE PCP WITHIN 3 DAYS Humfleet, RN, Marchelle Folks 07/03/2022 9:13:43 AM See PCP within 24 Hours Yes Humfleet, RN, Marchelle Folks Final Disposition 07/03/2022 9:13:43 AM See PCP within 24 Hours Yes Humfleet, RN, Earnestine Leys Disagree/Comply Comply Caller Understands Yes PreDisposition Did not know what to do Care Advice Given Per Guideline SEE PCP WITHIN 3 DAYS: * You need to be seen within 2 or 3 days. * PCP VISIT: Call your doctor (or NP/PA) during regular office hours and make an appointment. A clinic or urgent care center are good places to go for care if your doctor's office is closed or you can't get an appointment. NOTE: If office will be open tomorrow, tell caller to call then, not in 3 days. CARE ADVICE given per Tinnitus (Adult) guideline. * You become worse CALL BACK IF: * Earache occurs SEE PCP WITHIN 24 HOURS: * IF OFFICE WILL BE OPEN: You need to be examined within the next 24 hours. Call your doctor (or NP/PA) when the office opens and make an appointment. CARE ADVICE given per Dizziness - Vertigo (Adult) guideline. * You become worse CALL BACK IF: Referrals REFERRED TO PCP OFFICE

## 2022-07-03 NOTE — Telephone Encounter (Signed)
Appt today w/ Laura 

## 2022-07-03 NOTE — Progress Notes (Signed)
Hayley Case is a 72 y.o. 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 04/21/2022   Primary osteoarthritis of right knee 03/31/2022   Type 2 diabetes mellitus with stage 4 chronic kidney disease, with long-term current use of insulin (HCC) 07/02/2021   Arthritis of hip 04/18/2021   Background diabetic retinopathy (HCC) 04/18/2021   Chronic kidney disease, stage 3b (HCC) 04/18/2021   Dyslipidemia 04/18/2021   Family history of malignant neoplasm of digestive organs 04/18/2021   Gouty arthritis 04/18/2021   Hearing loss 04/18/2021   Hyperglycemia due to type 2 diabetes mellitus (HCC) 04/18/2021   Long term (current) use of insulin (HCC) 04/18/2021   Obstructive sleep apnea (adult) (pediatric) 04/18/2021   Peripheral arterial disease (HCC) 04/18/2021   Personal history of colonic polyps 04/18/2021   Flank pain 10/14/2019   Type 2 diabetes mellitus with diabetic polyneuropathy, with long-term current use of insulin (HCC) 12/01/2018   Type 2 diabetes mellitus with retinopathy, with long-term current use of insulin (HCC) 12/01/2018   Type 2 diabetes mellitus with stage 3b chronic kidney disease, with long-term current use of insulin (HCC) 11/30/2018   Non-intractable vomiting    Acute hepatitis 07/13/2018   Acute on chronic renal insufficiency 07/12/2018   Diabetes mellitus (HCC) 12/08/2016   Essential hypertension 12/08/2016   Hyperlipidemia associated with type 2 diabetes mellitus (HCC) 12/08/2016    Current Outpatient Medications  Medication Sig Dispense Refill   ACETAMINOPHEN PO Take 650 mg by mouth every 6 (six) hours as needed for headache or moderate pain.     albuterol (VENTOLIN HFA) 108 (90 Base) MCG/ACT inhaler Inhale 2 puffs into the lungs every 6 (six) hours as needed for wheezing or shortness of breath. 1 g 3   allopurinol (ZYLOPRIM) 100 MG tablet TAKE 1 TABLET  BY MOUTH EVERY DAY 90 tablet 0   amLODipine (NORVASC) 10 MG tablet TAKE 1 TABLET BY MOUTH EVERY DAY 90 tablet 1   Biotin 5000 MCG CAPS Take 1 capsule by mouth daily.     Cholecalciferol (VITAMIN D) 50 MCG (2000 UT) CAPS Take 4,000 Units by mouth daily.     Continuous Glucose Sensor (FREESTYLE LIBRE 3 SENSOR) MISC 1 Device by Does not apply route daily in the afternoon. Place 1 sensor on the skin every 14 days. Use to check glucose continuously 6 each 3   Docusate Sodium (COLACE PO) Take 1 capsule by mouth daily.     Evolocumab (REPATHA SURECLICK) 140 MG/ML SOAJ Inject 140 mg into the skin every 14 (fourteen) days. 6 mL 3   ezetimibe (ZETIA) 10 MG tablet TAKE 1 TABLET BY MOUTH EVERY DAY 90 tablet 1   fluocinonide (LIDEX) 0.05 % external solution Apply topically in the morning and at bedtime.     insulin aspart (NOVOLOG FLEXPEN) 100 UNIT/ML FlexPen Max daily 70 units 60 mL 3   insulin glargine, 2 Unit Dial, (TOUJEO MAX SOLOSTAR) 300 UNIT/ML Solostar Pen Inject 22 Units into the skin daily at 6 (six) AM. (Patient taking differently: Inject 22 Units into the skin daily at 6 (six) AM. In the evening) 30 mL 3   Insulin Pen Needle (PEN NEEDLES) 32G X 4 MM MISC 1 Device by Does not apply route in the morning, at noon, in the evening, and at bedtime. 400 each 3   loratadine (CLARITIN) 10 MG tablet Take 10 mg by mouth daily as  needed (seasonal allergies).     losartan (COZAAR) 50 MG tablet Take 1 tablet (50 mg total) by mouth daily. 90 tablet 0   meclizine (ANTIVERT) 12.5 MG tablet Take 1 tablet (12.5 mg total) by mouth 3 (three) times daily as needed for dizziness. 30 tablet 0   Polyethyl Glycol-Propyl Glycol (SYSTANE FREE OP) Apply 1 drop to eye as needed.     vitamin B-12 (CYANOCOBALAMIN) 1000 MCG tablet Take 1,000 mcg by mouth daily.     Semaglutide (RYBELSUS) 14 MG TABS Take 1 tablet (14 mg total) by mouth daily. (Patient not taking: Reported on 07/03/2022) 90 tablet 3   Semaglutide, 1 MG/DOSE, 4 MG/3ML  SOPN Inject 1 mg as directed once a week. (Patient not taking: Reported on 07/03/2022) 9 mL 3   No current facility-administered medications for this visit.    Allergies: Lisinopril, Atorvastatin, Penicillins, Rosuvastatin, Sulfamethoxazole-trimethoprim, and Penicillin g  Past Medical History:  Diagnosis Date   CKD (chronic kidney disease)    Diabetes mellitus without complication (HCC)    Hyperlipidemia    Hypertension     Past Surgical History:  Procedure Laterality Date   BREAST BIOPSY     BREAST EXCISIONAL BIOPSY Left     Family History  Problem Relation Age of Onset   Skin cancer Mother    Heart attack Father    Colon cancer Sister     Social History   Tobacco Use   Smoking status: Never   Smokeless tobacco: Never  Substance Use Topics   Alcohol use: No    Subjective:   Has been struggling with ringing in ears for the past week- seemed to start after hearing test done at ENT; does know that she needs to get hearing aids but unable to afford at this time; considering going to [employer] later date;  Notes that earlier today she had an episode of dizziness/ feeling that "room was spinning" when bending over; symptoms have now resolved but notes that she "just doesn't feel good" today; no vision changes or blurred vision or loss of speech or numbness/ tingling in extremities;    Objective:  Vitals:   07/03/22 1433  BP: 138/86  Pulse: 90  SpO2: 98%  Weight: 185 lb 6.4 oz (84.1 kg)  Height: 5\' 5"  (1.651 m)    General: Well developed, well nourished, in no acute distress  Skin : Warm and dry.  Head: Normocephalic and atraumatic  Eyes: Sclera and conjunctiva clear; pupils round and reactive to light; extraocular movements intact  Ears: External normal; canals clear; tympanic membranes normal  Oropharynx: Pink, supple. No suspicious lesions  Neck: Supple without thyromegaly, adenopathy; no carotid bruits noted Lungs: Respirations unlabored; clear to auscultation  bilaterally without wheeze, rales, rhonchi  CVS exam: normal rate and regular rhythm.  Neurologic: Alert and oriented; speech intact; face symmetrical; moves all extremities well; CNII-XII intact without focal deficit   Assessment:  1. Other peripheral vertigo, unspecified ear   2. Dizziness     Plan:  Suspect episode of benign vertigo today; will update head CT and bilateral carotid ultrasounds; she is encouraged to try and get fitted for hearing aids as soon as possible as this should help with sensation of ringing in ear;  Strict ER precautions discussed;   No follow-ups on file.  Orders Placed This Encounter  Procedures   CT HEAD WO CONTRAST ( )    Standing Status:   Future    Standing Expiration Date:   07/03/2023    Order  Specific Question:   Preferred imaging location?    Answer:   [facility]   US Carotid Bilateral    Standing Status:   Future    Standing Expiration Date:   07/03/2023    Order Specific Question:   Reason for Exam (SYMPTOM  OR DIAGNOSIS REQUIRED)    Answer:   dizziness    Order Specific Question:   Preferred imaging location?    Answer:   [facility] Specific Question:   Call Results- Best Contact Number?    Answer:   [phone]    Requested Prescriptions   Signed Prescriptions Disp Refills   meclizine (ANTIVERT) 12.5 MG tablet 30 tablet 0    Sig: Take 1 tablet (12.5 mg total) by mouth 3 (three) times daily as needed for dizziness.

## 2022-07-11 ENCOUNTER — Ambulatory Visit (HOSPITAL_BASED_OUTPATIENT_CLINIC_OR_DEPARTMENT_OTHER)
Admission: RE | Admit: 2022-07-11 | Discharge: 2022-07-11 | Disposition: A | Discharge status: Home / Self Care | Payer: Medicare HMO | Source: Ambulatory Visit | Attending: Family | Admitting: Family

## 2022-07-11 DIAGNOSIS — R42 Dizziness and giddiness: Secondary | ICD-10-CM | POA: Insufficient documentation

## 2022-07-11 DIAGNOSIS — H81399 Other peripheral vertigo, unspecified ear: Secondary | ICD-10-CM | POA: Diagnosis present

## 2022-07-14 ENCOUNTER — Other Ambulatory Visit: Payer: Self-pay | Admitting: Family

## 2022-07-14 DIAGNOSIS — E042 Nontoxic multinodular goiter: Secondary | ICD-10-CM

## 2022-07-14 DIAGNOSIS — I6523 Occlusion and stenosis of bilateral carotid arteries: Secondary | ICD-10-CM

## 2022-07-16 LAB — HM DIABETES EYE EXAM

## 2022-07-21 ENCOUNTER — Ambulatory Visit: Payer: Medicare HMO | Admitting: Pharmacist

## 2022-07-21 DIAGNOSIS — I10 Essential (primary) hypertension: Secondary | ICD-10-CM

## 2022-07-21 DIAGNOSIS — E1122 Type 2 diabetes mellitus with diabetic chronic kidney disease: Secondary | ICD-10-CM

## 2022-07-21 DIAGNOSIS — E1169 Type 2 diabetes mellitus with other specified complication: Secondary | ICD-10-CM

## 2022-07-21 NOTE — Progress Notes (Unsigned)
[facility] [place]  ASSESSMENT / PLAN: Hayley Case is a 72 y.o. female with asymptomatic bilateral carotid artery stenosis (mild on R; 60-79% on L).   Recommend:  Abstinence from all tobacco products. Blood glucose control with goal A1c < 7%. Blood pressure control with goal blood pressure < 140/90 mmHg. Lipid reduction therapy with goal LDL-C <100 mg/dL Aspirin 81mg  PO QD.  Atorvastatin 40-80mg  PO QD (or other "high intensity" statin therapy).  Follow up with me in 6 months with repeat carotid duplex.   CHIEF COMPLAINT: ***  HISTORY OF PRESENT ILLNESS: Hayley Case is a 72 y.o. female with vertiginous dizziness and hearing loss. Carotid duplex was ordered which revealed carotid stenosis.   VASCULAR SURGICAL HISTORY: ***  VASCULAR RISK FACTORS: {FINDINGS; POSITIVE NEGATIVE:[phone]} history of stroke / transient ischemic attack. {FINDINGS; POSITIVE NEGATIVE:[phone]} history of coronary artery disease. *** history of PCI. *** history of CABG.  {FINDINGS; POSITIVE NEGATIVE:[phone]} history of diabetes mellitus. Last A1c ***. {FINDINGS; POSITIVE NEGATIVE:[phone]} history of smoking. *** actively smoking. {FINDINGS; POSITIVE NEGATIVE:[phone]} history of hypertension. *** drug regimen with *** control. {FINDINGS; POSITIVE NEGATIVE:[phone]} history of chronic kidney disease.  Last GFR ***. CKD {stage:30421363}. {FINDINGS; POSITIVE NEGATIVE:[phone]} history of chronic obstructive pulmonary disease, treated with ***.  FUNCTIONAL STATUS: ECOG performance status: {findings; ecog performance status:31780} Ambulatory status: {TNHAmbulation:25868}  CAREY 1 AND 3 YEAR INDEX Female (2pts) 75-79 or 80-84 (2pts) >84 (3pts) Dependence in toileting (1pt) Partial or full dependence in dressing (1pt) History of malignant neoplasm (2pts) CHF (3pts) COPD (1pts) CKD (3pts)  0-3 pts 6% 1 year mortality ; 21% 3 year mortality 4-5 pts 12% 1 year  mortality ; 36% 3 year mortality >5 pts 21% 1 year mortality; 54% 3 year mortality   Past Medical History:  Diagnosis Date   CKD (chronic kidney disease)    Diabetes mellitus without complication (HCC)    Hyperlipidemia    Hypertension     Past Surgical History:  Procedure Laterality Date   BREAST BIOPSY     BREAST EXCISIONAL BIOPSY Left     Family History  Problem Relation Age of Onset   Skin cancer Mother    Heart attack Father    Colon cancer Sister     Social History   Socioeconomic History   Marital status: Widowed    Spouse name: Not on file   Number of children: Not on file   Years of education: Not on file   Highest education level: Not on file  Occupational History   Not on file  Tobacco Use   Smoking status: Never   Smokeless tobacco: Never  Vaping Use   Vaping Use: Never used  Substance and Sexual Activity   Alcohol use: No   Drug use: No   Sexual activity: Not Currently  Other Topics Concern   Not on file  Social History Narrative   Not on file   Social Determinants of Health   Financial Resource Strain: Medium Risk (07/03/2022)   Overall Financial Resource Strain (CARDIA)    Difficulty of Paying Living Expenses: Somewhat hard  Food Insecurity: Food Insecurity Present (04/17/2022)   Hunger Vital Sign    Worried About Running Out of Food in the Last Year: Sometimes true    Ran Out of Food in the Last Year: Sometimes true  Transportation Needs: Unmet Transportation Needs (07/03/2022)   PRAPARE - Administrator, Civil Service (Medical): Yes    Lack of Transportation (Non-Medical): Yes  Physical Activity: Inactive (07/03/2022)   Exercise Vital Sign    Days of Exercise per Week: 0 days    Minutes of Exercise per Session: 0 min  Stress: Stress Concern Present (07/03/2022)   [name] of Occupational Health - Occupational Stress Questionnaire    Feeling of Stress : To some extent  Social Connections: Moderately Integrated  (07/03/2022)   Social Connection and Isolation Panel [NHANES]    Frequency of Communication with Friends and Family: More than three times a week    Frequency of Social Gatherings with Friends and Family: Never    Attends Religious Services: More than 4 times per year    Active Member of [employer] or Organizations: Yes    Attends Banker Meetings: More than 4 times per year    Marital Status: Widowed  Intimate Partner Violence: Not At Risk (04/24/2022)   Humiliation, Afraid, Rape, and Kick questionnaire    Fear of Current or Ex-Partner: No    Emotionally Abused: No    Physically Abused: No    Sexually Abused: No    Allergies  Allergen Reactions   Lisinopril Cough   Atorvastatin Other (See Comments)    Liver function test elevation Other reaction(s): liver issues   Penicillins Hives and Itching    Did it involve swelling of the face/tongue/throat, SOB, or low BP? No Did it involve sudden or severe rash/hives, skin peeling, or any reaction on the inside of your mouth or nose? Yes Did you need to seek medical attention at a hospital or doctor's office? Yes When did it last happen?      2012 If all above answers are "NO", may proceed with cephalosporin use.   Rosuvastatin Other (See Comments)    Myalgias / leg cramps   Sulfamethoxazole-Trimethoprim Hives    Other reaction(s): Unknown   Penicillin G     Other reaction(s): Unknown    Current Outpatient Medications  Medication Sig Dispense Refill   ACETAMINOPHEN PO Take 650 mg by mouth every 6 (six) hours as needed for headache or moderate pain.     albuterol (VENTOLIN HFA) 108 (90 Base) MCG/ACT inhaler Inhale 2 puffs into the lungs every 6 (six) hours as needed for wheezing or shortness of breath. 1 g 3   allopurinol (ZYLOPRIM) 100 MG tablet TAKE 1 TABLET BY MOUTH EVERY DAY 90 tablet 0   amLODipine (NORVASC) 10 MG tablet TAKE 1 TABLET BY MOUTH EVERY DAY 90 tablet 1   Biotin 5000 MCG CAPS Take 1 capsule by mouth daily.      Cholecalciferol (VITAMIN D) 50 MCG (2000 UT) CAPS Take 4,000 Units by mouth daily.     Continuous Glucose Sensor (FREESTYLE LIBRE 3 SENSOR) MISC 1 Device by Does not apply route daily in the afternoon. Place 1 sensor on the skin every 14 days. Use to check glucose continuously 6 each 3   Docusate Sodium (COLACE PO) Take 1 capsule by mouth daily.     Evolocumab (REPATHA SURECLICK) 140 MG/ML SOAJ Inject 140 mg into the skin every 14 (fourteen) days. 6 mL 3   ezetimibe (ZETIA) 10 MG tablet TAKE 1 TABLET BY MOUTH EVERY DAY 90 tablet 1   fluocinonide (LIDEX) 0.05 % external solution Apply topically in the morning and at bedtime.     insulin aspart (NOVOLOG FLEXPEN) 100 UNIT/ML FlexPen Max daily 70 units 60 mL 3   insulin glargine, 2 Unit Dial, (TOUJEO MAX SOLOSTAR) 300 UNIT/ML Solostar Pen Inject 22 Units into the skin daily  at 6 (six) AM. (Patient taking differently: Inject 22 Units into the skin daily at 6 (six) AM. In the evening) 30 mL 3   Insulin Pen Needle (PEN NEEDLES) 32G X 4 MM MISC 1 Device by Does not apply route in the morning, at noon, in the evening, and at bedtime. 400 each 3   loratadine (CLARITIN) 10 MG tablet Take 10 mg by mouth daily as needed (seasonal allergies).     losartan (COZAAR) 50 MG tablet Take 1 tablet (50 mg total) by mouth daily. 90 tablet 0   meclizine (ANTIVERT) 12.5 MG tablet Take 1 tablet (12.5 mg total) by mouth 3 (three) times daily as needed for dizziness. 30 tablet 0   Polyethyl Glycol-Propyl Glycol (SYSTANE FREE OP) Apply 1 drop to eye as needed.     Semaglutide (RYBELSUS) 14 MG TABS Take 1 tablet (14 mg total) by mouth daily. (Patient not taking: Reported on 07/03/2022) 90 tablet 3   Semaglutide, 1 MG/DOSE, 4 MG/3ML SOPN Inject 1 mg as directed once a week. (Patient not taking: Reported on 07/03/2022) 9 mL 3   vitamin B-12 (CYANOCOBALAMIN) 1000 MCG tablet Take 1,000 mcg by mouth daily.     No current facility-administered medications for this visit.    PHYSICAL  EXAM There were no vitals filed for this visit.  Constitutional: *** appearing. *** distress. Appears *** nourished.  Neurologic: CN ***. *** focal findings. *** sensory loss. Psychiatric: *** Mood and affect symmetric and appropriate. Eyes: *** No icterus. No conjunctival pallor. Ears, nose, throat: *** mucous membranes moist. Midline trachea.  Cardiac: *** rate and rhythm.  Respiratory: *** unlabored. Abdominal: *** soft, non-tender, non-distended.  Peripheral vascular: *** Extremity: *** edema. *** cyanosis. *** pallor.  Skin: *** gangrene. *** ulceration.  Lymphatic: *** Stemmer's sign. *** palpable lymphadenopathy.    PERTINENT LABORATORY AND RADIOLOGIC DATA  Most recent CBC    Latest Ref Rng & Units 09/18/2020    9:53 AM 07/28/2018    4:43 PM 07/14/2018    5:53 AM  CBC  WBC 4.0 - 10.5 K/uL 9.4  6.1  4.8   Hemoglobin 12.0 - 15.0 g/dL 16.1  09.6  04.5   Hematocrit 36.0 - 46.0 % 34.2  36.7  34.2   Platelets 150.0 - 400.0 K/uL 329.0  232  189      Most recent CMP    Latest Ref Rng & Units 09/18/2020    9:53 AM 05/06/2019   12:22 PM 07/28/2018    4:43 PM  CMP  Glucose 70 - 99 mg/dL 97  409  811   BUN 6 - 23 mg/dL 31  30  31    Creatinine 0.40 - 1.20 mg/dL 9.14  7.82  9.56   Sodium 135 - 145 mEq/L 141  137  136   Potassium 3.5 - 5.1 mEq/L 4.2  4.9  4.5   Chloride 96 - 112 mEq/L 106  102  100   CO2 19 - 32 mEq/L 26  22  21    Calcium 8.4 - 10.5 mg/dL 9.3  9.3  9.6   Total Protein 6.0 - 8.3 g/dL 6.8  6.5  6.8   Total Bilirubin 0.2 - 1.2 mg/dL 0.6  0.5  0.8   Alkaline Phos 39 - 117 U/L 105  212  527   AST 0 - 37 U/L 13  17  172   ALT 0 - 35 U/L 13  26  295     Renal function CrCl cannot be calculated (  Patient's most recent lab result is older than the maximum 21 days allowed.).  Hemoglobin A1C (%)  Date Value  06/27/2022 7.3 (A)   Hgb A1c MFr Bld (%)  Date Value  05/18/2018 10.1 (H)    LDL Chol Calc (NIH)  Date Value Ref Range Status  05/06/2019 54 0 - 99 mg/dL  Final     Carotid duplex 07/11/22  Right:   Color duplex indicates moderate heterogeneous and calcified plaque, with no hemodynamically significant stenosis by duplex criteria in the extracranial cerebrovascular circulation.   Left:   Heterogeneous and partially calcified plaque at the left carotid bifurcation contributes to 50%-69% stenosis by established duplex criteria.   Heterogeneous thyroid with bilateral nodules. Recommend dedicated duplex for further evaluation, as needed.  Rande Brunt. Lenell Antu, MD FACS [facility] Phone Number: [phone] 07/21/2022 9:07 AM   Total time spent on preparing this encounter including chart review, data review, collecting history, examining the patient, coordinating care for this {tnhtimebilling:26202}  Portions of this report may have been transcribed using voice recognition software.  Every effort has been made to ensure accuracy; however, inadvertent computerized transcription errors may still be present.

## 2022-07-21 NOTE — Progress Notes (Signed)
Pharmacy Note  07/21/2022 Name: Hayley Case MRN: [id number] DOB: 07-01-1950  Subjective: Hayley Case is a 72 y.o. year old female who is a primary care patient of Olive Bass, FNP. Clinical Pharmacist Practitioner referral was placed to assist with medication and hyperlipidemia management.    Engaged with patient by telephone for follow up visit today.  Summary: Diabetes:  Managed by endocrinology - Dr Lonzo Cloud. Next appointment  Current therapy: Toujeo 22 units at night and Novolog sliding scale based on blood glucose - max of 70 units per day.  Rybelsus 14 mg daily - patient will finish current Rx and then switch to Ozempic 1mg  weekly.   Uses Continuous Glucose Monitor - Freestyle Libre 2 still but has been prescribed Libre 3. She has not received order for [facility] 3 from [facility] yet.   Hyperlipidemia: LDL goal per cardiology office < 55.   Current therapy: Reaptha 140mg  every 14 days and Continues to take ezetimibe 10mg  daily. She reports that copay for Praluent is costly ($75 per month) Has 3 other medications that are also $75 per month.   Past therapies: Atorvastatin caused increased LFTs requiring hospitalization. Rosuvastatin caused myalgias.  Started Praluent 150mg  every 14 days 06/17/2021 but stopped due to cost.  Switched to Repatha - gets assistance with medication copay cost with [employer] (good thru 12/16/2022)  Hypertension: 2 of 3 last office blood pressures above goal of < 130/80 (CKD).  Current hypertension therapy: losartan 50mg  daily and amlodipine 10mg  daily   Does not have blood pressure cuff at home. Patient has declined to check blood pressure at home.  Patient has c/o dizziness recently and saw PCP last week about it. Started meclinzine for vertigo and ordered imaging. Patient to see vascular surgeon regarding results of carotid ultrasound.   Carotid ultrasound noted Heterogeneous thyroid with bilateral nodules. Recommend  dedicated duplex for further evaluation. Has been ordered.   Objective: Review of patient status, including review of consultants reports, laboratory and other test data, was performed as part of comprehensive.  Lab Results  Component Value Date   CREATININE 1.98 (H) 09/18/2020   CREATININE 2.17 (H) 05/06/2019   CREATININE 1.86 (H) 07/28/2018    Lab Results  Component Value Date   HGBA1C 7.3 (A) 06/27/2022       Component Value Date/Time   CHOL 114 05/06/2019 1222   TRIG 145 05/06/2019 1222   HDL 35 (L) 05/06/2019 1222   CHOLHDL 3.3 05/06/2019 1222   LDLCALC 54 05/06/2019 1222     Clinical ASCVD: Yes  The ASCVD Risk score (Arnett DK, et al., 2019) failed to calculate for the following reasons:   Cannot find a previous HDL lab   Cannot find a previous total cholesterol lab    BP Readings from Last 3 Encounters:  07/03/22 138/86  06/27/22 120/80  04/28/22 134/74     Allergies  Allergen Reactions   Lisinopril Cough   Atorvastatin Other (See Comments)    Liver function test elevation Other reaction(s): liver issues   Penicillins Hives and Itching    Did it involve swelling of the face/tongue/throat, SOB, or low BP? No Did it involve sudden or severe rash/hives, skin peeling, or any reaction on the inside of your mouth or nose? Yes Did you need to seek medical attention at a hospital or doctor's office? Yes When did it last happen?      2012 If all above answers are "NO", may proceed with cephalosporin use.  Rosuvastatin Other (See Comments)    Myalgias / leg cramps   Sulfamethoxazole-Trimethoprim Hives    Other reaction(s): Unknown   Penicillin G     Other reaction(s): Unknown    Medications Reviewed Today     Reviewed by Judieth Keens, CMA (Certified Medical Assistant) on 07/03/22 at 1441  Med List Status: <None>   Medication Order Taking? Sig Documenting Provider Last Dose Status Informant  ACETAMINOPHEN PO [id number] Yes Take 650 mg by mouth every 6  (six) hours as needed for headache or moderate pain. [provider] Taking Active Self  albuterol (VENTOLIN HFA) 108 (90 Base) MCG/ACT inhaler [id number] Yes Inhale 2 puffs into the lungs every 6 (six) hours as needed for wheezing or shortness of breath. Sande Rives, MD Taking Active   allopurinol (ZYLOPRIM) 100 MG tablet [id number] Yes TAKE 1 TABLET BY MOUTH EVERY DAY Olive Bass, FNP Taking Active   amLODipine (NORVASC) 10 MG tablet [id number] Yes TAKE 1 TABLET BY MOUTH EVERY DAY Bradd Canary, MD Taking Active   Biotin 5000 MCG CAPS [id number] Yes Take 1 capsule by mouth daily. [provider] Taking Active Self  Cholecalciferol (VITAMIN D) 50 MCG (2000 UT) CAPS [id number] Yes Take 4,000 Units by mouth daily. [provider] Taking Active Self           Med Note Toy Care, JESSICA M   Thu Mar 27, 2022  2:11 PM) 4000  Continuous Glucose Sensor (FREESTYLE LIBRE 3 SENSOR) [place] [id number] Yes 1 Device by Does not apply route daily in the afternoon. Place 1 sensor on the skin every 14 days. Use to check glucose continuously Shamleffer, Konrad Dolores, MD Taking Active   Docusate Sodium (COLACE PO) [id number] Yes Take 1 capsule by mouth daily. [provider] Taking Active   Evolocumab [facility] SURECLICK) 140 MG/ML Ivory Broad [id number] Yes Inject 140 mg into the skin every 14 (fourteen) days. Iran Ouch, MD Taking Active   ezetimibe (ZETIA) 10 MG tablet [id number] Yes TAKE 1 TABLET BY MOUTH EVERY DAY Bradd Canary, MD Taking Active   fluocinonide (LIDEX) 0.05 % external solution [id number] Yes Apply topically in the morning and at bedtime. [provider] Taking Active            Med Note Clydie Braun, [facility]   Thu Jun 27, 2021 10:43 AM) Using as needed  insulin aspart (NOVOLOG FLEXPEN) 100 UNIT/ML FlexPen [id number] Yes Max daily 70 units Shamleffer, Konrad Dolores, MD Taking Active   insulin glargine, 2 Unit Dial, (TOUJEO MAX SOLOSTAR) 300  UNIT/ML Solostar Pen [id number] Yes Inject 22 Units into the skin daily at 6 (six) AM.  Patient taking differently: Inject 22 Units into the skin daily at 6 (six) AM. In the evening   Shamleffer, Konrad Dolores, MD Taking Active   Insulin Pen Needle (PEN NEEDLES) 32G X 4 MM MISC [id number] Yes 1 Device by Does not apply route in the morning, at noon, in the evening, and at bedtime. Shamleffer, Konrad Dolores, MD Taking Active   loratadine (CLARITIN) 10 MG tablet [id number] Yes Take 10 mg by mouth daily as needed (seasonal allergies). [provider] Taking Active Self  losartan (COZAAR) 50 MG tablet [id number] Yes Take 1 tablet (50 mg total) by mouth daily. Bradd Canary, MD Taking Active   Polyethyl Glycol-Propyl Glycol [facility] FREE OP) [id number] Yes Apply 1 drop to eye as needed. [provider] Taking Active   Semaglutide (RYBELSUS) 14 MG TABS [id number] No Take 1 tablet (  14 mg total) by mouth daily.  Patient not taking: Reported on 07/03/2022   Shamleffer, Konrad Dolores, MD Not Taking Active   Semaglutide, 1 MG/DOSE, 4 MG/3ML SOPN [id number] No Inject 1 mg as directed once a week.  Patient not taking: Reported on 07/03/2022   Shamleffer, Konrad Dolores, MD Not Taking Active   vitamin B-12 (CYANOCOBALAMIN) 1000 MCG tablet [id number] Yes Take 1,000 mcg by mouth daily. 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 04/21/2022   Primary osteoarthritis of right knee 03/31/2022   Type 2 diabetes mellitus with stage 4 chronic kidney disease, with long-term current use of insulin (HCC) 07/02/2021   Arthritis of hip 04/18/2021   Background diabetic retinopathy (HCC) 04/18/2021   Chronic kidney disease, stage 3b (HCC) 04/18/2021   Dyslipidemia 04/18/2021   Family history of malignant neoplasm of digestive organs 04/18/2021   Gouty arthritis 04/18/2021   Hearing loss  04/18/2021   Hyperglycemia due to type 2 diabetes mellitus (HCC) 04/18/2021   Long term (current) use of insulin (HCC) 04/18/2021   Obstructive sleep apnea (adult) (pediatric) 04/18/2021   Peripheral arterial disease (HCC) 04/18/2021   Personal history of colonic polyps 04/18/2021   Flank pain 10/14/2019   Type 2 diabetes mellitus with diabetic polyneuropathy, with long-term current use of insulin (HCC) 12/01/2018   Type 2 diabetes mellitus with retinopathy, with long-term current use of insulin (HCC) 12/01/2018   Type 2 diabetes mellitus with stage 3b chronic kidney disease, with long-term current use of insulin (HCC) 11/30/2018   Non-intractable vomiting    Acute hepatitis 07/13/2018   Acute on chronic renal insufficiency 07/12/2018   Diabetes mellitus (HCC) 12/08/2016   Essential hypertension 12/08/2016   Hyperlipidemia associated with type 2 diabetes mellitus (HCC) 12/08/2016     Medication Assistance:  Application for [employer] Hyperlipidemia grant approved ($2500 - from 12/16/2021 thru 12/16/2022).   Assessment / Plan: Diabetes: At goal of < 7.5% Continue current insulin regimen. Patient will transition over to Ozempic 1mg  weekly from Rybelsus 14mg  daily  Continue to follow up with Dr Lonzo Cloud.  Contacted [facility] to see if they have received order for [place] 3 sensor. They did have Libre 3 order but they have to speak with patient to initiate the order. Provided Ms. Isa Rankin with phone number to [facility] [phone]    Hyperlipidemia: LDL goal per cardiology office < 55.  Due to recheck lipids.  Continue Repatha 140mg  every 14 days and ezetimibe 10mg  daily.    Hypertension:  Continue amlodipine 10mg  daily and losartan 50mg  daily. Discussed purchasing a blood pressure cuff to check at home.  Unfortunately patient's [employer] plan does not cover over-the-counter products (has TRAIL retiree plan with state of [place])   Medication management:  Reviewed and  updated medication list Reviewed refill history and adherence  Follow Up:  Telephone follow up appointment with care management team member scheduled for:  3 to 4 months   Henrene Pastor, PharmD Clinical Pharmacist [facility]  - [facility] [phone]

## 2022-07-22 ENCOUNTER — Encounter: Payer: Self-pay | Admitting: Vascular Surgery

## 2022-07-22 ENCOUNTER — Ambulatory Visit (INDEPENDENT_AMBULATORY_CARE_PROVIDER_SITE_OTHER): Payer: Medicare HMO | Admitting: Vascular Surgery

## 2022-07-22 VITALS — BP 175/86 | HR 88 | Temp 98.2°F | Resp 20 | Ht 65.0 in | Wt 186.0 lb

## 2022-07-22 DIAGNOSIS — I6523 Occlusion and stenosis of bilateral carotid arteries: Secondary | ICD-10-CM | POA: Diagnosis not present

## 2022-07-23 ENCOUNTER — Ambulatory Visit (HOSPITAL_BASED_OUTPATIENT_CLINIC_OR_DEPARTMENT_OTHER)
Admission: RE | Admit: 2022-07-23 | Discharge: 2022-07-23 | Disposition: A | Discharge status: Home / Self Care | Payer: Medicare HMO | Source: Ambulatory Visit | Attending: Family | Admitting: Family

## 2022-07-23 DIAGNOSIS — E042 Nontoxic multinodular goiter: Secondary | ICD-10-CM | POA: Diagnosis not present

## 2022-07-28 ENCOUNTER — Other Ambulatory Visit: Payer: Self-pay | Admitting: Family

## 2022-07-28 DIAGNOSIS — Z1231 Encounter for screening mammogram for malignant neoplasm of breast: Secondary | ICD-10-CM

## 2022-08-05 ENCOUNTER — Other Ambulatory Visit: Payer: Self-pay

## 2022-08-05 DIAGNOSIS — I6523 Occlusion and stenosis of bilateral carotid arteries: Secondary | ICD-10-CM

## 2022-08-07 ENCOUNTER — Ambulatory Visit: Payer: Medicare HMO | Admitting: Cardiovascular Disease

## 2022-08-18 NOTE — Progress Notes (Unsigned)
Cardiology Office Note:   Date:  08/19/2022  NAME:  Hayley Case    MRN: [id number] DOB:  11-Oct-1950   PCP:  Olive Bass, FNP  Cardiologist:  Reatha Harps, MD  Electrophysiologist:  None   Referring MD: Olive Bass,*   Chief Complaint  Patient presents with   Follow-up    History of Present Illness:   Hayley Case is a 72 y.o. female with a hx of carotid artery disease, PAD, CKD IV, DM, HTN who presents for follow-up.  She reports she is doing well.  Dealing with vertigo.  Found to have a left internal carotid artery stenosis of 50 to 69%.  She does have a bruit on the right but no bruit on the left.  Our ultrasound did not detect this.  This was found at the vascular office.  Unclear whether discrepancy lies.  Her symptoms are not related to carotid artery disease.  She reports no chest pains or trouble breathing.  She is on Repatha.  She does need updated lipids.  Blood pressure is close enough to goal.  Denies any symptoms in office today.  Overall doing quite well.  Problem List 1. HTN 2. Obesity (BMI 36) 3. Diabetes -A1c 7.3 -T chol 114, HDL 35, LDL 54, TG 145 -statin intolerant  4. Carotid artery disease  -R ICA 1-39% -L ICA 50-69% 5. OSA 6. CKD Stage IV 7. PAD -R SFA 50-74% -L popliteal 50-74%  Past Medical History: Past Medical History:  Diagnosis Date   CKD (chronic kidney disease)    Diabetes mellitus without complication (HCC)    Hyperlipidemia    Hypertension     Past Surgical History: Past Surgical History:  Procedure Laterality Date   BREAST BIOPSY     BREAST EXCISIONAL BIOPSY Left     Current Medications: Current Meds  Medication Sig   ACETAMINOPHEN PO Take 650 mg by mouth every 6 (six) hours as needed for headache or moderate pain.   albuterol (VENTOLIN HFA) 108 (90 Base) MCG/ACT inhaler Inhale 2 puffs into the lungs every 6 (six) hours as needed for wheezing or shortness of breath.   allopurinol (ZYLOPRIM) 100 MG  tablet TAKE 1 TABLET BY MOUTH EVERY DAY   amLODipine (NORVASC) 10 MG tablet TAKE 1 TABLET BY MOUTH EVERY DAY   Biotin 5000 MCG CAPS Take 1 capsule by mouth daily.   Cholecalciferol (VITAMIN D) 50 MCG (2000 UT) CAPS Take 4,000 Units by mouth daily.   Continuous Glucose Sensor (FREESTYLE LIBRE 3 SENSOR) MISC 1 Device by Does not apply route daily in the afternoon. Place 1 sensor on the skin every 14 days. Use to check glucose continuously   Docusate Sodium (COLACE PO) Take 1 capsule by mouth daily.   Evolocumab (REPATHA SURECLICK) 140 MG/ML SOAJ Inject 140 mg into the skin every 14 (fourteen) days.   ezetimibe (ZETIA) 10 MG tablet TAKE 1 TABLET BY MOUTH EVERY DAY   fluocinonide (LIDEX) 0.05 % external solution Apply topically in the morning and at bedtime.   insulin aspart (NOVOLOG FLEXPEN) 100 UNIT/ML FlexPen Max daily 70 units (Patient taking differently: Inject 13 Units into the skin 3 (three) times daily with meals. Max daily 70 units)   insulin glargine, 2 Unit Dial, (TOUJEO MAX SOLOSTAR) 300 UNIT/ML Solostar Pen Inject 22 Units into the skin daily at 6 (six) AM.   Insulin Pen Needle (PEN NEEDLES) 32G X 4 MM MISC 1 Device by Does not apply route in the morning,  at noon, in the evening, and at bedtime.   loratadine (CLARITIN) 10 MG tablet Take 10 mg by mouth daily as needed (seasonal allergies).   losartan (COZAAR) 50 MG tablet Take 1 tablet (50 mg total) by mouth daily.   meclizine (ANTIVERT) 12.5 MG tablet Take 1 tablet (12.5 mg total) by mouth 3 (three) times daily as needed for dizziness.   Polyethyl Glycol-Propyl Glycol (SYSTANE FREE OP) Apply 1 drop to eye as needed.   Semaglutide, 1 MG/DOSE, 4 MG/3ML SOPN Inject 1 mg as directed once a week.   vitamin B-12 (CYANOCOBALAMIN) 1000 MCG tablet Take 1,000 mcg by mouth daily.     Allergies:    Lisinopril, Atorvastatin, Penicillins, Rosuvastatin, Sulfamethoxazole-trimethoprim, and Penicillin g   Social History: Social History    Socioeconomic History   Marital status: Widowed    Spouse name: Not on file   Number of children: Not on file   Years of education: Not on file   Highest education level: Not on file  Occupational History   Not on file  Tobacco Use   Smoking status: Never   Smokeless tobacco: Never  Vaping Use   Vaping status: Never Used  Substance and Sexual Activity   Alcohol use: No   Drug use: No   Sexual activity: Not Currently  Other Topics Concern   Not on file  Social History Narrative   Not on file   Social Determinants of Health   Financial Resource Strain: Medium Risk (07/03/2022)   Overall Financial Resource Strain (CARDIA)    Difficulty of Paying Living Expenses: Somewhat hard  Food Insecurity: Low Risk  (06/20/2022)   [facility]   Food vital sign    Within the past 12 months, you worried that your food would run out before you got money to buy more: Never true    Within the past 12 months, the food you bought just didn't last and you didn't have money to get more. : Never true  Recent Concern: Food Insecurity - Food Insecurity Present (04/17/2022)   Hunger Vital Sign    Worried About Running Out of Food in the Last Year: Sometimes true    Ran Out of Food in the Last Year: Sometimes true  Transportation Needs: Unmet Transportation Needs (07/03/2022)   PRAPARE - Transportation    Lack of Transportation (Medical): Yes    Lack of Transportation (Non-Medical): Yes  Physical Activity: Inactive (07/03/2022)   Exercise Vital Sign    Days of Exercise per Week: 0 days    Minutes of Exercise per Session: 0 min  Stress: Stress Concern Present (07/03/2022)   [name] of Occupational Health - Occupational Stress Questionnaire    Feeling of Stress : To some extent  Social Connections: Moderately Integrated (07/03/2022)   Social Connection and Isolation Panel [NHANES]    Frequency of Communication with Friends and Family: More than three times a week     Frequency of Social Gatherings with Friends and Family: Never    Attends Religious Services: More than 4 times per year    Active Member of [employer] or Organizations: Yes    Attends Banker Meetings: More than 4 times per year    Marital Status: Widowed     Family History: The patient's family history includes Colon cancer in her sister; Heart attack in her father; Skin cancer in her mother.  ROS:   All other ROS reviewed and negative. Pertinent positives noted in the HPI.  EKGs/Labs/Other Studies Reviewed:   The following studies were personally reviewed by me today:  EKG:  EKG is ordered today.    EKG Interpretation Date/Time:  Tuesday August 19 2022 08:29:49 EDT Ventricular Rate:  83 PR Interval:  150 QRS Duration:  78 QT Interval:  394 QTC Calculation: 462 R Axis:   68  Text Interpretation: Normal sinus rhythm Nonspecific ST abnormality Confirmed by Lennie Odor [phone]) on 08/19/2022 8:42:39 AM   Recent Labs: No results found for requested labs within last 365 days.   Recent Lipid Panel    Component Value Date/Time   CHOL 114 05/06/2019 1222   TRIG 145 05/06/2019 1222   HDL 35 (L) 05/06/2019 1222   CHOLHDL 3.3 05/06/2019 1222   LDLCALC 54 05/06/2019 1222    Physical Exam:   VS:  BP (!) 142/76   Pulse 83   Ht 5\' 4"  (1.626 m)   Wt 187 lb (84.8 kg)   SpO2 99%   BMI 32.10 kg/m    Wt Readings from Last 3 Encounters:  08/19/22 187 lb (84.8 kg)  07/22/22 186 lb (84.4 kg)  07/03/22 185 lb 6.4 oz (84.1 kg)    General: Well nourished, well developed, in no acute distress Head: Atraumatic, normal size  Eyes: PEERLA, EOMI  Neck: Supple, faint right carotid bruit Endocrine: No thryomegaly Cardiac: Normal S1, S2; RRR; no murmurs, rubs, or gallops Lungs: Clear to auscultation bilaterally, no wheezing, rhonchi or rales  Abd: Soft, nontender, no hepatomegaly  Ext: No edema, pulses 2+ Musculoskeletal: No deformities, BUE and BLE strength normal and  equal Skin: Warm and dry, no rashes   Neuro: Alert and oriented to person, place, time, and situation, CNII-XII grossly intact, no focal deficits  Psych: Normal mood and affect   ASSESSMENT:   Hayley Case is a 72 y.o. female who presents for the following: 1. Bilateral carotid artery stenosis   2. Hyperlipidemia associated with type 2 diabetes mellitus (HCC)   3. Dyslipidemia   4. PAD (peripheral artery disease) (HCC)   5. Essential hypertension     PLAN:   1. Bilateral carotid artery stenosis -Found to have left internal carotid artery stenosis 50 to 69%.  I ultrasounds had discordant data.  Unclear where the discrepancy lies.  She has a noticeable right carotid bruit.  This will be managed medically.  She should continue aspirin.  She is on Repatha and Zetia.  We will have her come back for fasting lipids.  Her LDL goal is less than 70.  2. Hyperlipidemia associated with type 2 diabetes mellitus (HCC) 3. Dyslipidemia -Return when fasting for fasting lipids.  4. PAD (peripheral artery disease) (HCC) -Moderate disease bilaterally.  No symptoms of claudication.  We will manage this medically.  Continue aspirin and aggressive lipid-lowering agents.  5. Essential hypertension -Within limits.  No change to medications today.  Disposition: Return in about 1 year (around 08/19/2023).  Medication Adjustments/Labs and Tests Ordered: Current medicines are reviewed at length with the patient today.  Concerns regarding medicines are outlined above.  Orders Placed This Encounter  Procedures   Lipid panel   EKG 12-Lead   No orders of the defined types were placed in this encounter.  Patient Instructions  Medication Instructions:   No changes  *If you need a refill on your cardiac medications before your next appointment, please call your pharmacy*   Lab Work: LIPID- fasting  If you have labs (blood work) drawn today and your tests are completely normal,  you will receive your  results only by: MyChart Message (if you have MyChart) OR A paper copy in the mail If you have any lab test that is abnormal or we need to change your treatment, we will call you to review the results.   Testing/Procedures:  Not needed  Follow-Up: At [facility], you and your health needs are our priority.  As part of our continuing mission to provide you with exceptional heart care, we have created designated Provider Care Teams.  These Care Teams include your primary Cardiologist (physician) and Advanced Practice Providers (APPs -  Physician Assistants and Nurse Practitioners) who all work together to provide you with the care you need, when you need it.     Your next appointment:   12 month(s)  The format for your next appointment:   In Person  Provider:   Reatha Harps, MD       Time Spent with Patient: I have spent a total of 35 minutes with patient reviewing hospital notes, telemetry, EKGs, labs and examining the patient as well as establishing an assessment and plan that was discussed with the patient.  > 50% of time was spent in direct patient care.  Signed, Lenna Gilford. Flora Lipps, MD, [facility]  [facility]  [address] [place], [place] [address] [phone]  08/19/2022 9:01 AM

## 2022-08-19 ENCOUNTER — Ambulatory Visit: Payer: Medicare HMO | Attending: Cardiovascular Disease | Admitting: Cardiovascular Disease

## 2022-08-19 ENCOUNTER — Encounter: Payer: Self-pay | Admitting: Cardiovascular Disease

## 2022-08-19 VITALS — BP 142/76 | HR 83 | Ht 64.0 in | Wt 187.0 lb

## 2022-08-19 DIAGNOSIS — I6523 Occlusion and stenosis of bilateral carotid arteries: Secondary | ICD-10-CM

## 2022-08-19 DIAGNOSIS — E1169 Type 2 diabetes mellitus with other specified complication: Secondary | ICD-10-CM | POA: Diagnosis not present

## 2022-08-19 DIAGNOSIS — I10 Essential (primary) hypertension: Secondary | ICD-10-CM

## 2022-08-19 DIAGNOSIS — E785 Hyperlipidemia, unspecified: Secondary | ICD-10-CM

## 2022-08-19 DIAGNOSIS — I739 Peripheral vascular disease, unspecified: Secondary | ICD-10-CM | POA: Diagnosis not present

## 2022-08-19 DIAGNOSIS — Z794 Long term (current) use of insulin: Secondary | ICD-10-CM

## 2022-08-19 NOTE — Patient Instructions (Signed)
Medication Instructions:   No changes  *If you need a refill on your cardiac medications before your next appointment, please call your pharmacy*   Lab Work: LIPID- fasting  If you have labs (blood work) drawn today and your tests are completely normal, you will receive your results only by: MyChart Message (if you have MyChart) OR A paper copy in the mail If you have any lab test that is abnormal or we need to change your treatment, we will call you to review the results.   Testing/Procedures:  Not needed  Follow-Up: At [facility], you and your health needs are our priority.  As part of our continuing mission to provide you with exceptional heart care, we have created designated Provider Care Teams.  These Care Teams include your primary Cardiologist (physician) and Advanced Practice Providers (APPs -  Physician Assistants and Nurse Practitioners) who all work together to provide you with the care you need, when you need it.     Your next appointment:   12 month(s)  The format for your next appointment:   In Person  Provider:   Reatha Harps, MD

## 2022-08-20 ENCOUNTER — Ambulatory Visit
Admission: RE | Admit: 2022-08-20 | Discharge: 2022-08-20 | Disposition: A | Discharge status: Home / Self Care | Payer: Medicare HMO | Source: Ambulatory Visit | Attending: Family | Admitting: Family

## 2022-08-20 DIAGNOSIS — Z1231 Encounter for screening mammogram for malignant neoplasm of breast: Secondary | ICD-10-CM

## 2022-08-21 ENCOUNTER — Ambulatory Visit (INDEPENDENT_AMBULATORY_CARE_PROVIDER_SITE_OTHER): Payer: Medicare HMO | Admitting: Psychology

## 2022-08-21 DIAGNOSIS — F4323 Adjustment disorder with mixed anxiety and depressed mood: Secondary | ICD-10-CM | POA: Diagnosis not present

## 2022-08-21 NOTE — Progress Notes (Signed)
[facility] Counselor/Therapist Progress Note  Patient ID: EMERSEN KELSAY, MRN: [id number],    Date: 08/21/2022  Time Spent: 11:00am-11:55am     55 minutes   Treatment Type: Individual Therapy  Reported Symptoms: stress, sadness  Mental Status Exam: Appearance:  Casual     Behavior: Appropriate  Motor: Normal  Speech/Language:  Normal Rate  Affect: Appropriate  Mood: normal  Thought process: normal  Thought content:   WNL  Sensory/Perceptual disturbances:   WNL  Orientation: oriented to person, place, time/date, and situation  Attention: Good  Concentration: Good  Memory: WNL  Fund of knowledge:  Good  Insight:   Good  Judgment:  Good  Impulse Control: Good   Risk Assessment: Danger to Self:  No Self-injurious Behavior: No Danger to Others: No Duty to Warn:no Physical Aggression / Violence:No  Access to Firearms a concern: No  Gang Involvement:No   Subjective: Pt present for face-to-face individual therapy via video.  Pt consents to telehealth video session and is aware of limitations of virtual sessions. Location of pt: home Location of therapist: home office.   Pt talked about her health.  She was sick with vertigo.  Pt was given several tests and saw a vascular surgeon who is going to be monitoring her.  Pt saw a team of doctors.  She is still struggling with some vertigo and will continue to have doctors appointments.  Pt is going to a family gathering next week to celebrate her sister's retirement.  Pt is looking forward to spending time with family.  Pt talked about concerns about her son.   He is on dialysis and has been having additional health issues.  Addressed pt's worries.  Pt's son also acts needy with pt at times and asks pt to do things for him that he could do himself.  Addressed how pt can communicate boundaries and her needs to her son.  Addressed the stress of care giving.  Encouraged pt to increase self care.  Provided supportive  therapy.  Interventions: Cognitive Behavioral Therapy and Insight-Oriented  Diagnosis: F43.23   Plan of Care: Recommend ongoing therapy.  Pt participated in setting treatment goals.   Pt wants to improve coping skills.  Plan to meet monthly.  Treatment Plan  (treatment plan target date:  11/08/2022) Client Abilities/Strengths  Pt is bright, engaging, and motivated for therapy.  Client Treatment Preferences  Individual therapy.  Client Statement of Needs  Improve copings skills and understand herself better. Improve self esteem.  Symptoms  Depressed or irritable mood. Excessive and/or unrealistic worry that is difficult to control occurring more days than not for at least 6 months about a number of events or activities. Hypervigilance (e.g., feeling constantly on edge, experiencing concentration difficulties, having trouble falling or staying asleep, exhibiting a general state of irritability). Low self-esteem. Problems Addressed  Unipolar Depression, Anxiety Goals 1. Alleviate depressive symptoms and return to previous level of effective functioning. 2. Appropriately grieve the loss in order to normalize mood and to return to previously adaptive level of functioning. Objective Learn and implement behavioral strategies to overcome depression. Target Date: 2022-11-08 Frequency: Monthly  Progress: 40 Modality: individual  Related Interventions Assist the client in developing skills that increase the likelihood of deriving pleasure from behavioral activation (e.g., assertiveness skills, developing an exercise plan, less internal/more external focus, increased social involvement); reinforce success. Engage the client in "behavioral activation," increasing his/her activity level and contact with sources of reward, while identifying processes that inhibit activation. use behavioral  techniques such as instruction, rehearsal, role-playing, role reversal, as needed, to facilitate activity in the  client's daily life; reinforce success. 3. Develop healthy interpersonal relationships that lead to the alleviation and help prevent the relapse of depression. 4. Develop healthy thinking patterns and beliefs about self, others, and the world that lead to the alleviation and help prevent the relapse of depression. 5. Enhance ability to effectively cope with the full variety of life's worries and anxieties. 6. Learn and implement coping skills that result in a reduction of anxiety and worry, and improved daily functioning. Objective Learn and implement problem-solving strategies for realistically addressing worries. Target Date: 2022-11-08 Frequency: Monthly  Progress: 40 Modality: individual  Related Interventions Assign the client a homework exercise in which he/she problem-solves a current problem (see Mastery of Your Anxiety and Worry: Workbook by Elenora Fender and Filbert Schilder or Generalized Anxiety Disorder by Elesa Hacker, and Filbert Schilder); review, reinforce success, and provide corrective feedback toward improvement. Teach the client problem-solving strategies involving specifically defining a problem, generating options for addressing it, evaluating the pros and cons of each option, selecting and implementing an optional action, and reevaluating and refining the action. Objective Learn and implement calming skills to reduce overall anxiety and manage anxiety symptoms. Target Date: 2022-11-08 Frequency: Monthly  Progress: 40 Modality: individual  Related Interventions Assign the client to read about progressive muscle relaxation and other calming strategies in relevant books or treatment manuals (e.g., Progressive Relaxation Training by Twana First; Mastery of Your Anxiety and Worry: Workbook by Earlie Counts). Assign the client homework each session in which he/she practices relaxation exercises daily, gradually applying them progressively from non-anxiety-provoking to anxiety-provoking  situations; review and reinforce success while providing corrective feedback toward improvement. Teach the client calming/relaxation skills (e.g., applied relaxation, progressive muscle relaxation, cue controlled relaxation; mindful breathing; biofeedback) and how to discriminate better between relaxation and tension; teach the client how to apply these skills to his/her daily life. 7. Recognize, accept, and cope with feelings of depression. 8. Reduce overall frequency, intensity, and duration of the anxiety so that daily functioning is not impaired. 9. Resolve the core conflict that is the source of anxiety. 10. Stabilize anxiety level while increasing ability to function on a daily basis. Diagnosis F43.23 Conditions For Discharge Achievement of treatment goals and objectives   Salomon Fick, LCSW

## 2022-09-04 ENCOUNTER — Ambulatory Visit (INDEPENDENT_AMBULATORY_CARE_PROVIDER_SITE_OTHER): Payer: Medicare HMO | Admitting: Family Medicine

## 2022-09-04 ENCOUNTER — Encounter: Payer: Self-pay | Admitting: Family Medicine

## 2022-09-04 VITALS — BP 129/75 | HR 97 | Temp 98.3°F | Resp 20 | Ht 64.0 in | Wt 181.8 lb

## 2022-09-04 DIAGNOSIS — U071 COVID-19: Secondary | ICD-10-CM

## 2022-09-04 DIAGNOSIS — R6883 Chills (without fever): Secondary | ICD-10-CM | POA: Diagnosis not present

## 2022-09-04 DIAGNOSIS — J029 Acute pharyngitis, unspecified: Secondary | ICD-10-CM

## 2022-09-04 LAB — BASIC METABOLIC PANEL
BUN/Creatinine Ratio: 13 (calc) (ref 6–22)
BUN: 37 mg/dL — ABNORMAL HIGH (ref 7–25)
CO2: 21 mmol/L (ref 20–32)
Calcium: 8.9 mg/dL (ref 8.6–10.4)
Chloride: 104 mmol/L (ref 98–110)
Creat: 2.78 mg/dL — ABNORMAL HIGH (ref 0.60–1.00)
Glucose, Bld: 200 mg/dL — ABNORMAL HIGH (ref 65–99)
Potassium: 4.1 mmol/L (ref 3.5–5.3)
Sodium: 137 mmol/L (ref 135–146)

## 2022-09-04 LAB — POC COVID19 BINAXNOW: SARS Coronavirus 2 Ag: POSITIVE — AB

## 2022-09-04 MED ORDER — BENZONATATE 200 MG PO CAPS: 200.0000 mg | ORAL_CAPSULE | Freq: Two times a day (BID) | ORAL | 0 refills | Status: DC | PRN

## 2022-09-04 MED ORDER — ALBUTEROL SULFATE HFA 108 (90 BASE) MCG/ACT IN AERS
2.0000 | INHALATION_SPRAY | Freq: Four times a day (QID) | RESPIRATORY_TRACT | 3 refills | Status: AC | PRN
Start: 2022-09-04 — End: ?

## 2022-09-04 NOTE — Patient Instructions (Signed)
Over the counter medications that may be helpful for symptoms:  Guaifenesin 1200 mg extended release tabs twice daily, with plenty of water For cough and congestion Brand name: Mucinex   Pseudoephedrine 30 mg, one or two tabs every 4 to 6 hours For sinus congestion Brand name: Sudafed You must get this from the pharmacy counter.  Oxymetazoline nasal spray each morning, one spray in each nostril, for NO MORE THAN 3 days  For nasal and sinus congestion Brand name: Afrin Saline nasal spray or Saline Nasal Irrigation 3-5 times a day For nasal and sinus congestion Brand names: Ocean or AYR Fluticasone nasal spray, one spray in each nostril, each morning (after oxymetazoline and saline, if used) For nasal and sinus congestion Brand name: Flonase Warm salt water gargles  For sore throat Every few hours as needed Alternate ibuprofen 400-600 mg and acetaminophen 1000 mg every 4-6 hours For fever, body aches, headache Brand names: Motrin or Advil and Tylenol Dextromethorphan 12-hour cough version 30 mg every 12 hours  For cough Brand name: Delsym Stop all other cold medications for now (Nyquil, Dayquil, Tylenol Cold, Theraflu, etc) and other non-prescription cough/cold preparations. Many of these have the same ingredients listed above and could cause an overdose of medication.   Herbal treatments that have been shown to be helpful in some patients include: Vitamin C 1000mg per day Vitamin D 4000iU per day Zinc 100mg per day Quercetin 25-500mg twice a day Melatonin 5-10mg at bedtime  General Instructions Allow your body to rest Drink PLENTY of fluids Isolate yourself from everyone, even family, until test results have returned  If your COVID-19 test is positive Then you ARE INFECTED and you can pass the virus to others You must quarantine from others for a minimum of  5 days since symptoms started AND You are fever free for 24 hours WITHOUT any medication to reduce fever AND Your  symptoms are improving Wear mask for the next 5 days If not improved by day 5, quarantine for the full 10 days. Do not go to the store or other public areas Do not go around household members who are not known to be infected with COVID-19 If you MUST leave your area of quarantine (example: go to a bathroom you share with others in your home), you must Wear a mask Wash your hands thoroughly Wipe down any surfaces you touch Do not share food, drinks, towels, or other items with other persons Dispose of your own tissues, food containers, etc  Once you have recovered, please continue good preventive care measures, including:  wearing a mask when in public wash your hands frequently avoid touching your face/nose/eyes cover coughs/sneezes with the inside of your elbow stay out of crowds keep a 6 foot distance from others  If you develop severe shortness of breath, uncontrolled fevers, coughing up blood, confusion, chest pain, or signs of dehydration (such as significantly decreased urine amounts or dizziness with standing) please go to the ER.  

## 2022-09-04 NOTE — Progress Notes (Signed)
Acute Office Visit  Subjective:     Patient ID: Hayley Case, female    DOB: 1950/07/03, 72 y.o.   MRN: [id number]  Chief Complaint  Patient presents with   Covid Positive    HPI Patient is in today for URI.  Discussed the use of AI scribe software for clinical note transcription with the patient, who gave verbal consent to proceed.  History of Present Illness   The patient began experiencing symptoms on Monday. They report shortness of breath, coughing, chills, and a feverish feeling, although no elevated temperature was recorded. The patient also describes a significant weakness and fatigue, which has limited their ability to stand and perform activities. They have also experienced body aches and headaches, albeit to a lesser extent than the respiratory symptoms.  The patient has not previously contracted COVID-19 and had received vaccination prior to this infection. They suspect exposure to the virus occurred during a recent family gathering in [place]. Upon return from the trip, the patient started feeling unwell.  The patient also reports a loss of appetite, but unlike many COVID-19 patients, they have not lost their sense of taste or smell. They have been able to consume chicken broth but have generally lacked the desire to eat.  They have been under the care of a nephrologist, whom they saw about a month ago, but lab results are not available for review. The patient also uses an albuterol inhaler, which is listed in their medication history, but needs a refill.  The patient's primary concern is the shortness of breath, which they describe as the most debilitating symptom. They have not noticed any wheezing but have requested cough medicine to manage their coughing symptoms.           ROS All review of systems negative except what is listed in the HPI      Objective:    BP 129/75   Pulse 97   Temp 98.3 F (36.8 C) (Oral)   Resp 20   Ht 5\' 4"  (1.626 m)   Wt  181 lb 12.8 oz (82.5 kg)   SpO2 99%   BMI 31.21 kg/m    Physical Exam Vitals reviewed.  Constitutional:      General: She is not in acute distress.    Appearance: Normal appearance. She is obese.  HENT:     Head: Normocephalic and atraumatic.     Mouth/Throat:     Mouth: Mucous membranes are moist.     Pharynx: Oropharynx is clear. No oropharyngeal exudate or posterior oropharyngeal erythema.  Cardiovascular:     Rate and Rhythm: Normal rate and regular rhythm.     Heart sounds: Normal heart sounds.  Pulmonary:     Effort: Pulmonary effort is normal.     Breath sounds: Normal breath sounds. No wheezing, rhonchi or rales.  Musculoskeletal:     Cervical back: Normal range of motion and neck supple.  Skin:    General: Skin is warm and dry.  Neurological:     Mental Status: She is alert and oriented to person, place, and time.  Psychiatric:        Mood and Affect: Mood normal.        Behavior: Behavior normal.        Thought Content: Thought content normal.        Judgment: Judgment normal.     Results for orders placed or performed in visit on 09/04/22  POC COVID-19 BinaxNow  Result Value Ref Range  SARS Coronavirus 2 Ag Positive (A) Negative        Assessment & Plan:   Problem List Items Addressed This Visit   None Visit Diagnoses     COVID-19    -  Primary   Relevant Medications   albuterol (VENTOLIN HFA) 108 (90 Base) MCG/ACT inhaler   benzonatate (TESSALON) 200 MG capsule   Other Relevant Orders   Basic Metabolic Panel (BMET)   Chills       Relevant Orders   POC COVID-19 BinaxNow (Completed)   Sore throat       Relevant Orders   POC COVID-19 BinaxNow (Completed)     Assessment and Plan    COVID-19 Recent shortness of breath, cough, chills, fatigue, and headache. No loss of taste or smell. Vaccinated. Discussed the use of Paxlovid, an antiviral medication, but need to check kidney function before prescribing. -Order stat kidney function tests. -If  kidney function allows, prescribe Paxlovid to be started by tomorrow. -Provide albuterol inhaler for shortness of breath. -Prescribe cough medicine. -Advise rest, hydration, and isolation from others -Advise over-the-counter symptom management with Tylenol, Mucinex, and Flonase as needed. -Recommend hydration with water and Pedialyte.  Patient aware of signs/symptoms requiring further/urgent evaluation.    Meds ordered this encounter  Medications   albuterol (VENTOLIN HFA) 108 (90 Base) MCG/ACT inhaler    Sig: Inhale 2 puffs into the lungs every 6 (six) hours as needed for wheezing or shortness of breath.    Dispense:  1 g    Refill:  3    Order Specific Question:   Supervising Provider    Answer:   Danise Edge A [[id number]]   benzonatate (TESSALON) 200 MG capsule    Sig: Take 1 capsule (200 mg total) by mouth 2 (two) times daily as needed for cough.    Dispense:  20 capsule    Refill:  0    Order Specific Question:   Supervising Provider    Answer:   Danise Edge A [[id number]]    Return if symptoms worsen or fail to improve.  Clayborne Dana, NP

## 2022-09-16 ENCOUNTER — Ambulatory Visit: Payer: Medicare HMO | Admitting: Psychology

## 2022-09-19 ENCOUNTER — Other Ambulatory Visit: Payer: Self-pay | Admitting: Family Medicine

## 2022-09-24 ENCOUNTER — Other Ambulatory Visit: Payer: Self-pay | Admitting: Family Medicine

## 2022-10-01 ENCOUNTER — Ambulatory Visit: Payer: Medicare HMO | Admitting: Pharmacist

## 2022-10-01 DIAGNOSIS — R7989 Other specified abnormal findings of blood chemistry: Secondary | ICD-10-CM

## 2022-10-01 DIAGNOSIS — I10 Essential (primary) hypertension: Secondary | ICD-10-CM

## 2022-10-01 DIAGNOSIS — M791 Myalgia, unspecified site: Secondary | ICD-10-CM

## 2022-10-01 DIAGNOSIS — I739 Peripheral vascular disease, unspecified: Secondary | ICD-10-CM

## 2022-10-01 DIAGNOSIS — E1169 Type 2 diabetes mellitus with other specified complication: Secondary | ICD-10-CM

## 2022-10-01 DIAGNOSIS — E1122 Type 2 diabetes mellitus with diabetic chronic kidney disease: Secondary | ICD-10-CM

## 2022-10-01 NOTE — Patient Instructions (Signed)
I am checking to see if I can get a blood pressure monitor for you to use at home.   Check your blood pressure twice weekly, and any time you have concerning symptoms like headache, chest pain, dizziness, shortness of breath, or vision changes.   Our goal is less than 130/80.  To appropriately check your blood pressure, make sure you do the following:  1) Avoid caffeine, exercise, or tobacco products for 30 minutes before checking. Empty your bladder. 2) Sit with your back supported in a flat-backed chair. Rest your arm on something flat (arm of the chair, table, etc). 3) Sit still with your feet flat on the floor, resting, for at least 5 minutes.  4) Check your blood pressure. Take 1-2 readings.  5) Write down these readings and bring with you to any provider appointments.  Bring your home blood pressure machine with you to a provider's office for accuracy comparison at least once a year.   Make sure you take your blood pressure medications before you come to any office visit, even if you were asked to fast for labs.

## 2022-10-01 NOTE — Progress Notes (Addendum)
Pharmacy Note  10/01/2022 Name: Hayley Case MRN: [id number] DOB: 02-16-1950  Subjective: Hayley Case is a 72 y.o. year old female who is a primary care patient of Olive Bass, FNP. Clinical Pharmacist Practitioner referral was placed to assist with medication and hyperlipidemia management.    Engaged with patient by telephone for follow up visit today.   Diabetes:  Managed by endocrinology - Dr Lonzo Cloud. Next appointment 12/2022 Current therapy: Toujeo 22 units at night and Novolog sliding scale based on blood glucose - max of 70 units per day.  Ozempic 1mg  weekly.  Patient reports today she has been having constipation and using docusate but has not been as helpful recently as in the past.  Patient will have colonoscopy 10/30/2022 and was told that Ozempic could affect stomach emptying but is not sure what she should do.   Uses Continuous Glucose Monitor - Freestyle Libre 3. Not connected to our practice currently. Per patient she needs to set up account thru [employer] app. Gets from DME [facility]  Patient recently was COVID positive. Since she was not eligible for Paxlovid due to elevated Scr she has been taking Emergen-C daily.   Hyperlipidemia / bilateral carotid artery stenosis / PAD: LDL goal per cardiology office < 55.  Current therapy: Reaptha 140mg  every 14 days and Continues to take ezetimibe 10mg  daily.  Patient was suppose to get lipids checked per Dr Flora Lipps but has not been able to get checked yet.   Suppose take aspirin 81mg  daily per Dr Flora Lipps but has not restarted because she was unsure why it was stopped in past.   Past therapies: Atorvastatin caused increased LFTs requiring hospitalization. Rosuvastatin caused myalgias.  Started Praluent 150mg  every 14 days 06/17/2021 but stopped due to cost.  Switched to Repatha - gets assistance with medication copay cost with [employer] (good thru 12/16/2022)  Hypertension: 2 of 3 last office  blood pressures above goal of < 130/80 (CKD) but last office blood pressure was good.  Current hypertension therapy: losartan 50mg  daily and amlodipine 10mg  daily   Does not have blood pressure cuff at home. We checked her Monia Pouch Medicare Benefits at last visit and her plan thru [facility] program does not cover blood pressure monitors.   CKD:  Patient last saw nephrologist 08/14/2022.  Scr was noted to have increased when BMP checked 09/04/2022 - per chart labs were faxed to nephrology office.  At the time of labs on 8/22, patient was positive for COVID.   Objective: Review of patient status, including review of consultants reports, laboratory and other test data, was performed as part of comprehensive.  Lab Results  Component Value Date   CREATININE 2.78 (H) 09/04/2022   CREATININE 1.98 (H) 09/18/2020   CREATININE 2.17 (H) 05/06/2019    Lab Results  Component Value Date   HGBA1C 7.3 (A) 06/27/2022       Component Value Date/Time   CHOL 114 05/06/2019 1222   TRIG 145 05/06/2019 1222   HDL 35 (L) 05/06/2019 1222   CHOLHDL 3.3 05/06/2019 1222   LDLCALC 54 05/06/2019 1222     Clinical ASCVD: Yes  The ASCVD Risk score (Arnett DK, et al., 2019) failed to calculate for the following reasons:   Cannot find a previous HDL lab   Cannot find a previous total cholesterol lab    BP Readings from Last 3 Encounters:  09/04/22 129/75  08/19/22 (!) 142/76  07/22/22 (!) 175/86  Allergies  Allergen Reactions   Lisinopril Cough   Atorvastatin Other (See Comments)    Liver function test elevation Other reaction(s): liver issues   Penicillins Hives and Itching    Did it involve swelling of the face/tongue/throat, SOB, or low BP? No Did it involve sudden or severe rash/hives, skin peeling, or any reaction on the inside of your mouth or nose? Yes Did you need to seek medical attention at a hospital or doctor's office? Yes When did it last happen?      2012 If all  above answers are "NO", may proceed with cephalosporin use.   Rosuvastatin Other (See Comments)    Myalgias / leg cramps   Sulfamethoxazole-Trimethoprim Hives    Other reaction(s): Unknown   Penicillin G     Other reaction(s): Unknown    Medications Reviewed Today     Reviewed by Henrene Pastor, RPH-CPP (Pharmacist) on 10/01/22 at 1142  Med List Status: <None>   Medication Order Taking? Sig Documenting Provider Last Dose Status Informant  ACETAMINOPHEN PO [id number]  Take 650 mg by mouth every 6 (six) hours as needed for headache or moderate pain. [provider]  Active Self  albuterol (VENTOLIN HFA) 108 (90 Base) MCG/ACT inhaler [id number]  Inhale 2 puffs into the lungs every 6 (six) hours as needed for wheezing or shortness of breath. Clayborne Dana, NP  Active   allopurinol (ZYLOPRIM) 100 MG tablet [id number]  TAKE 1 TABLET BY MOUTH EVERY DAY Olive Bass, FNP  Active   amLODipine (NORVASC) 10 MG tablet [id number] Yes TAKE 1 TABLET BY MOUTH EVERY DAY Bradd Canary, MD Taking Active   benzonatate (TESSALON) 200 MG capsule [id number]  Take 1 capsule (200 mg total) by mouth 2 (two) times daily as needed for cough. Clayborne Dana, NP  Active   Biotin 5000 MCG CAPS [id number]  Take 1 capsule by mouth daily. [provider]  Active Self  Cholecalciferol (VITAMIN D) 50 MCG (2000 UT) CAPS [id number]  Take 4,000 Units by mouth daily. [provider]  Active Self           Med Note Toy Care, JESSICA M   Thu Mar 27, 2022  2:11 PM) 4000  Continuous Glucose Sensor (FREESTYLE LIBRE 3 SENSOR) [place] [id number]  1 Device by Does not apply route daily in the afternoon. Place 1 sensor on the skin every 14 days. Use to check glucose continuously Shamleffer, Konrad Dolores, MD  Active   Docusate Sodium (COLACE PO) [id number]  Take 1 capsule by mouth daily. [provider]  Active   Evolocumab (REPATHA SURECLICK) 140 MG/ML SOAJ [id number]  Inject 140 mg into the skin every  14 (fourteen) days. Iran Ouch, MD  Active   ezetimibe (ZETIA) 10 MG tablet [id number]  TAKE 1 TABLET BY MOUTH EVERY DAY Bradd Canary, MD  Active   fluocinonide (LIDEX) 0.05 % external solution [id number]  Apply topically in the morning and at bedtime. [provider]  Active            Med Note Clydie Braun, Rolande Moe B   Thu Jun 27, 2021 10:43 AM) Using as needed  insulin aspart (NOVOLOG FLEXPEN) 100 UNIT/ML FlexPen [id number]  Max daily 70 units  Patient taking differently: Inject 13 Units into the skin 3 (three) times daily with meals. Max daily 70 units   Shamleffer, Konrad Dolores, MD  Active   insulin glargine, 2 Unit Dial, (TOUJEO MAX SOLOSTAR) 300 UNIT/ML Solostar Pen [id number]  Inject 22  Units into the skin daily at 6 (six) AM. Shamleffer, Konrad Dolores, MD  Active   Insulin Pen Needle (PEN NEEDLES) 32G X 4 MM MISC [id number]  1 Device by Does not apply route in the morning, at noon, in the evening, and at bedtime. Shamleffer, Konrad Dolores, MD  Active   loratadine (CLARITIN) 10 MG tablet [id number]  Take 10 mg by mouth daily as needed (seasonal allergies). [provider]  Active Self  losartan (COZAAR) 50 MG tablet [id number]  TAKE 1 TABLET BY MOUTH EVERY DAY Bradd Canary, MD  Active   meclizine (ANTIVERT) 12.5 MG tablet [id number] Yes Take 1 tablet (12.5 mg total) by mouth 3 (three) times daily as needed for dizziness. Olive Bass, FNP Taking Active   Polyethyl Glycol-Propyl Glycol [facility] FREE OP) [id number]  Apply 1 drop to eye as needed. [provider]  Active   Semaglutide, 1 MG/DOSE, 4 MG/3ML SOPN [id number] Yes Inject 1 mg as directed once a week. Shamleffer, Konrad Dolores, MD Taking Active   vitamin B-12 (CYANOCOBALAMIN) 1000 MCG tablet [id number] Yes Take 1,000 mcg by mouth daily. 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  04/21/2022   Primary osteoarthritis of right knee 03/31/2022   Type 2 diabetes mellitus with stage 4 chronic kidney disease, with long-term current use of insulin (HCC) 07/02/2021   Arthritis of hip 04/18/2021   Background diabetic retinopathy (HCC) 04/18/2021   Chronic kidney disease, stage 3b (HCC) 04/18/2021   Dyslipidemia 04/18/2021   Family history of malignant neoplasm of digestive organs 04/18/2021   Gouty arthritis 04/18/2021   Hearing loss 04/18/2021   Hyperglycemia due to type 2 diabetes mellitus (HCC) 04/18/2021   Long term (current) use of insulin (HCC) 04/18/2021   Obstructive sleep apnea (adult) (pediatric) 04/18/2021   Peripheral arterial disease (HCC) 04/18/2021   Personal history of colonic polyps 04/18/2021   Flank pain 10/14/2019   Type 2 diabetes mellitus with diabetic polyneuropathy, with long-term current use of insulin (HCC) 12/01/2018   Type 2 diabetes mellitus with retinopathy, with long-term current use of insulin (HCC) 12/01/2018   Type 2 diabetes mellitus with stage 3b chronic kidney disease, with long-term current use of insulin (HCC) 11/30/2018   Non-intractable vomiting    Acute hepatitis 07/13/2018   Acute on chronic renal insufficiency 07/12/2018   Diabetes mellitus (HCC) 12/08/2016   Essential hypertension 12/08/2016   Hyperlipidemia associated with type 2 diabetes mellitus (HCC) 12/08/2016     Medication Assistance:  Application for [employer] Hyperlipidemia grant approved ($2500 - from 12/16/2021 thru 12/16/2022).   Assessment / Plan: Diabetes: At goal of < 7.5% Continue current insulin regimen.  Continue Ozempic 1mg  weekly. Recommended she hold dose on 10/11 - 1 week prior to colonoscopy. She can restart with next dose as long and no GI problems after colonoscopy. Continue to follow up with Dr Lonzo Cloud.  Continue to use Libre 3 sensors to check blood glucose 3 or more times per day.  Recommended she stop Emergen C due to having 1000mg  of  vitamin C. She can take up to 500mg  of vitamin C daily. Also to take Elderberry and zinc for immune support.   Hyperlipidemia / PAD / bilateral artery stenosis: LDL goal per cardiology office < 55.  Due to recheck lipids - appt provided for 10/07/2022 in our office. Will forward results  to Dr Flora Lipps.  Berneda Rose Repatha 140mg  every 14 days and ezetimibe 10mg  daily.  Recommend restart aspirin 81mg  daily (per Dr Flora Lipps notes) - will start after her colonoscopy 10/30/22 as long as no bleeding issues identified with colonoscopy. Plan to reapply for [facility] in Nov 2024 if funding is available.    Hypertension:  Continue amlodipine 10mg  daily and losartan 50mg  daily. Discussed purchasing a blood pressure cuff to check at home. Patient reports she does not have funds to purchase $30 blood pressure cuff. We checked previously and her [employer] plan does not cover over-the-counter products (has TRAIL retiree plan with state of [place]).   The patients has a diagnosis of hypertension and it is medically necessary for them to have access to a home device to monitor blood pressure.  The patient does not have readily available insurance access to a device and cannot afford to purchase a device at this time.  The patient has been counseled that they do not need to continue to receive services from [facility] to receive a device.  The patient will be given a device free of charge.   CKD:  Recheck BMP / CMP next week  Medication management:  Reviewed and updated medication list Reviewed refill history and adherence  Follow Up:  Telephone follow up appointment with care management team member scheduled for:  November 2024   Henrene Pastor, PharmD Clinical Pharmacist [facility] Primary [facility]  - [facility] [phone]

## 2022-10-07 ENCOUNTER — Other Ambulatory Visit (INDEPENDENT_AMBULATORY_CARE_PROVIDER_SITE_OTHER): Payer: Medicare HMO

## 2022-10-07 DIAGNOSIS — E785 Hyperlipidemia, unspecified: Secondary | ICD-10-CM | POA: Diagnosis not present

## 2022-10-07 DIAGNOSIS — E1169 Type 2 diabetes mellitus with other specified complication: Secondary | ICD-10-CM

## 2022-10-07 DIAGNOSIS — I739 Peripheral vascular disease, unspecified: Secondary | ICD-10-CM

## 2022-10-07 DIAGNOSIS — R7989 Other specified abnormal findings of blood chemistry: Secondary | ICD-10-CM | POA: Diagnosis not present

## 2022-10-07 LAB — LIPID PANEL
Cholesterol: 163 mg/dL (ref 0–200)
HDL: 56.5 mg/dL (ref >39.00)
LDL Cholesterol: 84 mg/dL (ref 0–99)
NonHDL: 106.23
Total CHOL/HDL Ratio: 3
Triglycerides: 110 mg/dL (ref 0.0–149.0)
VLDL: 22 mg/dL (ref 0.0–40.0)

## 2022-10-07 LAB — BASIC METABOLIC PANEL
BUN: 35 mg/dL — ABNORMAL HIGH (ref 6–23)
CO2: 24 mEq/L (ref 19–32)
Calcium: 9.3 mg/dL (ref 8.4–10.5)
Chloride: 109 mEq/L (ref 96–112)
Creatinine, Ser: 2.26 mg/dL — ABNORMAL HIGH (ref 0.40–1.20)
GFR: 21.31 mL/min — ABNORMAL LOW (ref >60.00)
Glucose, Bld: 154 mg/dL — ABNORMAL HIGH (ref 70–99)
Potassium: 4.2 mEq/L (ref 3.5–5.1)
Sodium: 141 mEq/L (ref 135–145)

## 2022-10-07 LAB — LDL CHOLESTEROL, DIRECT: Direct LDL: 79 mg/dL

## 2022-10-14 ENCOUNTER — Telehealth: Payer: Self-pay | Admitting: *Deleted

## 2022-10-14 ENCOUNTER — Ambulatory Visit: Payer: Medicare HMO | Admitting: Psychology

## 2022-10-14 DIAGNOSIS — F4323 Adjustment disorder with mixed anxiety and depressed mood: Secondary | ICD-10-CM

## 2022-10-14 NOTE — Progress Notes (Signed)
[facility] Counselor/Therapist Progress Note  Patient ID: Hayley Case, MRN: [id number],    Date: 10/14/2022  Time Spent: 11:00am-11:55am     55 minutes   Treatment Type: Individual Therapy  Reported Symptoms: stress  Mental Status Exam: Appearance:  Casual     Behavior: Appropriate  Motor: Normal  Speech/Language:  Normal Rate  Affect: Appropriate  Mood: normal  Thought process: normal  Thought content:   WNL  Sensory/Perceptual disturbances:   WNL  Orientation: oriented to person, place, time/date, and situation  Attention: Good  Concentration: Good  Memory: WNL  Fund of knowledge:  Good  Insight:   Good  Judgment:  Good  Impulse Control: Good   Risk Assessment: Danger to Self:  No Self-injurious Behavior: No Danger to Others: No Duty to Warn:no Physical Aggression / Violence:No  Access to Firearms a concern: No  Gang Involvement:No   Subjective: Pt present for face-to-face individual therapy via video.  Pt consents to telehealth video session and is aware of limitations and benefits of virtual sessions. Location of pt: home Location of therapist: home office.   Pt talked about her health.  She had covid and is still feeling fatigued.   Pt talked about the family reunion she attended for her sister's retirement celebration.  The travel was stressful but the time with family was good.  Pt talked about concerns about her son.   He is on dialysis and has been having additional health issues.  Addressed pt's worries.  Pt's son also acts needy with pt at times and asks pt to do things for him that he could do himself.  Addressed how pt can communicate boundaries and her needs to her son.  Addressed the stress of care giving.  Encouraged pt to increase self care.  Provided supportive therapy.  Interventions: Cognitive Behavioral Therapy and Insight-Oriented  Diagnosis: F43.23   Plan of Care: Recommend ongoing therapy.  Pt participated in setting  treatment goals.   Pt wants to improve coping skills.  Plan to meet monthly.  Treatment Plan  (treatment plan target date:  11/08/2022) Client Abilities/Strengths  Pt is bright, engaging, and motivated for therapy.  Client Treatment Preferences  Individual therapy.  Client Statement of Needs  Improve copings skills and understand herself better. Improve self esteem.  Symptoms  Depressed or irritable mood. Excessive and/or unrealistic worry that is difficult to control occurring more days than not for at least 6 months about a number of events or activities. Hypervigilance (e.g., feeling constantly on edge, experiencing concentration difficulties, having trouble falling or staying asleep, exhibiting a general state of irritability). Low self-esteem. Problems Addressed  Unipolar Depression, Anxiety Goals 1. Alleviate depressive symptoms and return to previous level of effective functioning. 2. Appropriately grieve the loss in order to normalize mood and to return to previously adaptive level of functioning. Objective Learn and implement behavioral strategies to overcome depression. Target Date: 2022-11-08 Frequency: Monthly  Progress: 40 Modality: individual  Related Interventions Assist the client in developing skills that increase the likelihood of deriving pleasure from behavioral activation (e.g., assertiveness skills, developing an exercise plan, less internal/more external focus, increased social involvement); reinforce success. Engage the client in "behavioral activation," increasing his/her activity level and contact with sources of reward, while identifying processes that inhibit activation. use behavioral techniques such as instruction, rehearsal, role-playing, role reversal, as needed, to facilitate activity in the client's daily life; reinforce success. 3. Develop healthy interpersonal relationships that lead to the alleviation and help prevent the  relapse of depression. 4. Develop  healthy thinking patterns and beliefs about self, others, and the world that lead to the alleviation and help prevent the relapse of depression. 5. Enhance ability to effectively cope with the full variety of life's worries and anxieties. 6. Learn and implement coping skills that result in a reduction of anxiety and worry, and improved daily functioning. Objective Learn and implement problem-solving strategies for realistically addressing worries. Target Date: 2022-11-08 Frequency: Monthly  Progress: 40 Modality: individual  Related Interventions Assign the client a homework exercise in which he/she problem-solves a current problem (see Mastery of Your Anxiety and Worry: Workbook by Elenora Fender and Filbert Schilder or Generalized Anxiety Disorder by Elesa Hacker, and Filbert Schilder); review, reinforce success, and provide corrective feedback toward improvement. Teach the client problem-solving strategies involving specifically defining a problem, generating options for addressing it, evaluating the pros and cons of each option, selecting and implementing an optional action, and reevaluating and refining the action. Objective Learn and implement calming skills to reduce overall anxiety and manage anxiety symptoms. Target Date: 2022-11-08 Frequency: Monthly  Progress: 40 Modality: individual  Related Interventions Assign the client to read about progressive muscle relaxation and other calming strategies in relevant books or treatment manuals (e.g., Progressive Relaxation Training by Twana First; Mastery of Your Anxiety and Worry: Workbook by Earlie Counts). Assign the client homework each session in which he/she practices relaxation exercises daily, gradually applying them progressively from non-anxiety-provoking to anxiety-provoking situations; review and reinforce success while providing corrective feedback toward improvement. Teach the client calming/relaxation skills (e.g., applied relaxation,  progressive muscle relaxation, cue controlled relaxation; mindful breathing; biofeedback) and how to discriminate better between relaxation and tension; teach the client how to apply these skills to his/her daily life. 7. Recognize, accept, and cope with feelings of depression. 8. Reduce overall frequency, intensity, and duration of the anxiety so that daily functioning is not impaired. 9. Resolve the core conflict that is the source of anxiety. 10. Stabilize anxiety level while increasing ability to function on a daily basis. Diagnosis F43.23 Conditions For Discharge Achievement of treatment goals and objectives   Salomon Fick, LCSW

## 2022-10-14 NOTE — Telephone Encounter (Signed)
Pre-operative Risk Assessment    Patient Name: Hayley Case  DOB: [birth year] MRN: [id number]      Request for Surgical Clearance    Procedure:   Colonoscopy  Date of Surgery:  Clearance 10/30/22                                 Surgeon:  Dr. Charlott Rakes Surgeon's Group or Practice Name:  [facility] Phone number:  [phone] Fax number:  [phone]   Type of Clearance Requested:   - Medical    Type of Anesthesia:   Propofol   Additional requests/questions:    Signed, Emmit Pomfret   10/14/2022, 2:47 PM

## 2022-10-14 NOTE — Telephone Encounter (Signed)
Name: Hayley Case  DOB: 1950/11/23  MRN: [id number]  Primary Cardiologist: Reatha Harps, MD   Preoperative team, please contact this patient and set up a phone call appointment for further preoperative risk assessment. Please obtain consent and complete medication review. Thank you for your help.  I confirm that guidance regarding antiplatelet and oral anticoagulation therapy has been completed and, if necessary, noted below.  None  I also confirmed the patient resides in the state of [place]. As per [facility] Medical Board telemedicine laws, the patient must reside in the state in which the provider is licensed.   Napoleon Form, Leodis Rains, NP 10/14/2022, 3:07 PM [facility]

## 2022-10-15 ENCOUNTER — Telehealth: Payer: Self-pay | Admitting: *Deleted

## 2022-10-15 NOTE — Telephone Encounter (Signed)
Pt has been scheduled for tele pre op appt 10/27/22 @ 10:40 ok per Robin Searing, NP due to procedure date. Med rec and consent are done.     Patient Consent for Virtual Visit        Hayley Case has provided verbal consent on 10/15/2022 for a virtual visit (video or telephone).   CONSENT FOR VIRTUAL VISIT FOR:  Hayley Case  By participating in this virtual visit I agree to the following:  I hereby voluntarily request, consent and authorize [facility] and its employed or contracted physicians, physician assistants, nurse practitioners or other licensed health care professionals (the Practitioner), to provide me with telemedicine health care services (the "Services") as deemed necessary by the treating Practitioner. I acknowledge and consent to receive the Services by the Practitioner via telemedicine. I understand that the telemedicine visit will involve communicating with the Practitioner through live audiovisual communication technology and the disclosure of certain medical information by electronic transmission. I acknowledge that I have been given the opportunity to request an in-person assessment or other available alternative prior to the telemedicine visit and am voluntarily participating in the telemedicine visit.  I understand that I have the right to withhold or withdraw my consent to the use of telemedicine in the course of my care at any time, without affecting my right to future care or treatment, and that the Practitioner or I may terminate the telemedicine visit at any time. I understand that I have the right to inspect all information obtained and/or recorded in the course of the telemedicine visit and may receive copies of available information for a reasonable fee.  I understand that some of the potential risks of receiving the Services via telemedicine include:  Delay or interruption in medical evaluation due to technological equipment failure or  disruption; Information transmitted may not be sufficient (e.g. poor resolution of images) to allow for appropriate medical decision making by the Practitioner; and/or  In rare instances, security protocols could fail, causing a breach of personal health information.  Furthermore, I acknowledge that it is my responsibility to provide information about my medical history, conditions and care that is complete and accurate to the best of my ability. I acknowledge that Practitioner's advice, recommendations, and/or decision may be based on factors not within their control, such as incomplete or inaccurate data provided by me or distortions of diagnostic images or specimens that may result from electronic transmissions. I understand that the practice of medicine is not an exact science and that Practitioner makes no warranties or guarantees regarding treatment outcomes. I acknowledge that a copy of this consent can be made available to me via my patient portal [facility] MyChart), or I can request a printed copy by calling the office of [facility].    I understand that my insurance will be billed for this visit.   I have read or had this consent read to me. I understand the contents of this consent, which adequately explains the benefits and risks of the Services being provided via telemedicine.  I have been provided ample opportunity to ask questions regarding this consent and the Services and have had my questions answered to my satisfaction. I give my informed consent for the services to be provided through the use of telemedicine in my medical care

## 2022-10-15 NOTE — Telephone Encounter (Signed)
Pt has been scheduled for tele pre op appt 10/27/22 @ 10:40 ok per Robin Searing, NP due to procedure date. Med rec and consent are done.

## 2022-10-25 ENCOUNTER — Other Ambulatory Visit: Payer: Self-pay | Admitting: Family Medicine

## 2022-10-25 DIAGNOSIS — I10 Essential (primary) hypertension: Secondary | ICD-10-CM

## 2022-10-27 ENCOUNTER — Encounter: Payer: Self-pay | Admitting: Nurse Practitioner

## 2022-10-27 ENCOUNTER — Ambulatory Visit: Payer: Medicare HMO | Attending: Nurse Practitioner | Admitting: Nurse Practitioner

## 2022-10-27 DIAGNOSIS — Z01810 Encounter for preprocedural cardiovascular examination: Secondary | ICD-10-CM

## 2022-10-27 NOTE — Progress Notes (Signed)
Virtual Visit via Telephone Note   Because of Latrica D Case's co-morbid illnesses, she is at least at moderate risk for complications without adequate follow up.  This format is felt to be most appropriate for this patient at this time.  The patient did not have access to video technology/had technical difficulties with video requiring transitioning to audio format only (telephone).  All issues noted in this document were discussed and addressed.  No physical exam could be performed with this format.  Please refer to the patient's chart for her consent to telehealth for [facility].  Evaluation Performed:  Preoperative cardiovascular risk assessment _____________   Date:  10/27/2022   Patient ID:  Hayley Case, DOB 1950/11/25, MRN [id number] Patient Location:  Home Provider location:   Office  Primary Care Provider:  Olive Bass, FNP Primary Cardiologist:  Reatha Harps, MD  Chief Complaint / Patient Profile   72 y.o. y/o female with a h/o carotid artery disease, CKD 4, diabetes, hypertension, hyperlipidemia, PAD who is pending colonoscopy and presents today for telephonic preoperative cardiovascular risk assessment.  History of Present Illness    Hayley Case is a 72 y.o. female who presents via audio/video conferencing for a telehealth visit today.  Pt was last seen in cardiology clinic on 08/19/2022 by Dr. Flora Lipps.  At that time [name] was doing well.  The patient is now pending procedure as outlined above. Since her last visit, she  denies chest pain, shortness of breath, lower extremity edema, fatigue, palpitations, melena, hematuria, hemoptysis, diaphoresis, weakness, presyncope, syncope, orthopnea, and PND. She reports she is having shortness of breath that has been persistent since having COVID. She remains active at home with house keeping and is able to complete > 4 METS activity without concerning cardiac symptoms.    Past Medical History     Past Medical History:  Diagnosis Date   CKD (chronic kidney disease)    Diabetes mellitus without complication (HCC)    Hyperlipidemia    Hypertension    Past Surgical History:  Procedure Laterality Date   BREAST BIOPSY     BREAST EXCISIONAL BIOPSY Left     Allergies  Allergies  Allergen Reactions   Lisinopril Cough   Atorvastatin Other (See Comments)    Liver function test elevation Other reaction(s): liver issues   Penicillins Hives and Itching    Did it involve swelling of the face/tongue/throat, SOB, or low BP? No Did it involve sudden or severe rash/hives, skin peeling, or any reaction on the inside of your mouth or nose? Yes Did you need to seek medical attention at a hospital or doctor's office? Yes When did it last happen?      2012 If all above answers are "NO", may proceed with cephalosporin use.   Rosuvastatin Other (See Comments)    Myalgias / leg cramps   Sulfamethoxazole-Trimethoprim Hives    Other reaction(s): Unknown   Penicillin G     Other reaction(s): Unknown    Home Medications    Prior to Admission medications   Medication Sig Start Date End Date Taking? Authorizing Provider  ACETAMINOPHEN PO Take 650 mg by mouth every 6 (six) hours as needed for headache or moderate pain.    [provider]  albuterol (VENTOLIN HFA) 108 (90 Base) MCG/ACT inhaler Inhale 2 puffs into the lungs every 6 (six) hours as needed for wheezing or shortness of breath. 09/04/22   Clayborne Dana, NP  allopurinol (ZYLOPRIM) 100  MG tablet TAKE 1 TABLET BY MOUTH EVERY DAY 06/11/22   Olive Bass, FNP  amLODipine (NORVASC) 10 MG tablet TAKE 1 TABLET BY MOUTH EVERY DAY 05/02/22   Bradd Canary, MD  benzonatate (TESSALON) 200 MG capsule Take 1 capsule (200 mg total) by mouth 2 (two) times daily as needed for cough. 09/04/22   Clayborne Dana, NP  Biotin 5000 MCG CAPS Take 1 capsule by mouth daily.    [provider]  Cholecalciferol (VITAMIN D) 50 MCG  (2000 UT) CAPS Take 4,000 Units by mouth daily.    [provider]  Continuous Glucose Sensor (FREESTYLE LIBRE 3 SENSOR) MISC 1 Device by Does not apply route daily in the afternoon. Place 1 sensor on the skin every 14 days. Use to check glucose continuously 06/27/22   Shamleffer, Konrad Dolores, MD  Docusate Sodium (COLACE PO) Take 1 capsule by mouth daily.    [provider]  Evolocumab (REPATHA SURECLICK) 140 MG/ML SOAJ Inject 140 mg into the skin every 14 (fourteen) days. 02/04/22   Iran Ouch, MD  ezetimibe (ZETIA) 10 MG tablet TAKE 1 TABLET BY MOUTH EVERY DAY 09/24/22   Bradd Canary, MD  fluocinonide (LIDEX) 0.05 % external solution Apply topically in the morning and at bedtime. 05/09/21   [provider]  insulin aspart (NOVOLOG FLEXPEN) 100 UNIT/ML FlexPen Max daily 70 units Patient taking differently: Inject 13 Units into the skin 3 (three) times daily with meals. Max daily 70 units 12/20/21   Shamleffer, Konrad Dolores, MD  insulin glargine, 2 Unit Dial, (TOUJEO MAX SOLOSTAR) 300 UNIT/ML Solostar Pen Inject 22 Units into the skin daily at 6 (six) AM. 12/20/21   Shamleffer, Konrad Dolores, MD  Insulin Pen Needle (PEN NEEDLES) 32G X 4 MM MISC 1 Device by Does not apply route in the morning, at noon, in the evening, and at bedtime. 12/20/21   Shamleffer, Konrad Dolores, MD  loratadine (CLARITIN) 10 MG tablet Take 10 mg by mouth daily as needed (seasonal allergies).    [provider]  losartan (COZAAR) 50 MG tablet TAKE 1 TABLET BY MOUTH EVERY DAY 09/19/22   Bradd Canary, MD  meclizine (ANTIVERT) 12.5 MG tablet Take 1 tablet (12.5 mg total) by mouth 3 (three) times daily as needed for dizziness. 07/03/22   Olive Bass, FNP  Polyethyl Glycol-Propyl Glycol (SYSTANE FREE OP) Apply 1 drop to eye as needed.    [provider]  Semaglutide, 1 MG/DOSE, 4 MG/3ML SOPN Inject 1 mg as directed once a week. 06/27/22   Shamleffer, Konrad Dolores,  MD  vitamin B-12 (CYANOCOBALAMIN) 1000 MCG tablet Take 1,000 mcg by mouth daily.    [provider]    Physical Exam    Vital Signs:  MILLA MCPETERS does not have vital signs available for review today.  Given telephonic nature of communication, physical exam is limited. AAOx3. NAD. Normal affect.  Speech and respirations are unlabored.  Accessory Clinical Findings    None  Assessment & Plan    1.  Preoperative Cardiovascular Risk Assessment: According to the Revised Cardiac Risk Index (RCRI), her Perioperative Risk of Major Cardiac Event is (%): 6.6. Her Functional Capacity in METs is: 6.05 according to the Duke Activity Status Index (DASI). The patient is doing well from a cardiac perspective. Therefore, based on ACC/AHA guidelines, the patient would be at acceptable risk for the planned procedure without further cardiovascular testing.   The patient was advised that if she  develops new symptoms prior to surgery to contact our office to arrange for a follow-up visit, and she verbalized understanding.  No cardiac medications to hold. She reports she was told by the vascular surgery to start aspirin after her colonoscopy.   A copy of this note will be routed to requesting surgeon.  Time:   Today, I have spent 10 minutes with the patient with telehealth technology discussing medical history, symptoms, and management plan.    Levi Aland, NP-C 10/27/2022, 10:36 AM [address], [address] Office [phone] Fax [phone]

## 2022-10-30 LAB — HM COLONOSCOPY

## 2022-11-18 ENCOUNTER — Ambulatory Visit: Payer: Medicare HMO | Admitting: Psychology

## 2022-11-18 DIAGNOSIS — F4323 Adjustment disorder with mixed anxiety and depressed mood: Secondary | ICD-10-CM

## 2022-11-18 NOTE — Progress Notes (Signed)
[facility] Counselor Initial Adult Exam  Name: RHENDA OREGON Date: 11/18/2022 MRN: [id number] DOB: [birth year] PCP: Olive Bass, FNP  Time spent: 11:00am - 11:55am    55 minutes  Guardian/Payee:  Donnamarie Poag requested: No   Reason for Visit /Presenting Problem: Pt present for face-to-face initial assessment update via video.  Pt consents to telehealth video session and is aware of limitations and benefits of virtual sessions. Location of pt: home Location of therapist: home office.  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  He is on the kidney transplant list.  Pt gets overwhelmed.   Pt lives with her son.  Pt continues to need support in therapy. Pt has health issues herself.  It can be hard to be a care giver at 72 years old.   Pt gets fatigued at times.  Pt has had alot of loss in her life.  He husband died in 12-12-01.  One of her sons died in December 13, 2010.  Pt's sister and brother died the past couple of years.   Reviewed pt's  treatment plan for annual update.   Updated treatment plan and IA.    Pt participated in setting treatment goals.  Pt wants support and to improve coping skills.  Plan to continue to meet monthly.  Mental Status Exam: Appearance:   Casual     Behavior:  Appropriate  Motor:  Normal  Speech/Language:   Normal Rate  Affect:  Appropriate  Mood:  normal  Thought process:  normal  Thought content:    WNL  Sensory/Perceptual disturbances:    WNL  Orientation:  oriented to person, place, time/date, and situation  Attention:  Good  Concentration:  Good  Memory:  WNL  Fund of knowledge:   Good  Insight:    Good  Judgment:   Good  Impulse Control:  Good    Reported Symptoms:  stress, overwhelm  Risk Assessment: Danger to Self:  No Self-injurious Behavior: No Danger to Others: No Duty to Warn:no Physical Aggression / Violence:No  Access to Firearms a concern: No  Gang Involvement:No  Patient / guardian was  educated about steps to take if suicide or homicide risk level increases between visits: n/a While future psychiatric events cannot be accurately predicted, the patient does not currently require acute inpatient psychiatric care and does not currently meet [facility] involuntary commitment criteria.  Substance Abuse History: Current substance abuse: No     Past Psychiatric History:   Previous psychological history is significant for depression Outpatient Providers:pt has been in therapy in the past History of Psych Hospitalization: No  Psychological Testing:  n/a    Abuse History:  Victim of: No.,  n/a    Report needed: No. Victim of Neglect:No. Perpetrator of  n/a   Witness / Exposure to Domestic Violence: No   Protective Services Involvement: No  Witness to [employer] Violence:  No   Family History:  Family History  Problem Relation Age of Onset   Skin cancer Mother    Heart attack Father    Colon cancer Sister     Living situation: the patient lives with her son.  Pt grew up with both parents and 15 siblings until she was 67 years old.  Pt is number 12 in the birth order.   Pt had asthma.  Pt was given to her aunt and uncle when she was 62 years old and was raised as an only  child in [place].   She would visit her family in the summer.   Pt felt she was raised in a loving family.   Pt is known for being easy going and comical.   Family history of mental health issues:  none noted.   Sexual Orientation: Straight  Relationship Status: widowed  Name of spouse / other:n/a If a parent, number of children / ages:pt has two sons and one daughter.    Her other son passed away in 11/25/10.  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     Strengths: Family, Spirituality, Hopefulness, Self Advocate, and Able to Communicate Effectively  Barriers:  none   Legal History: Pending legal issue / charges: The patient has no significant history of legal issues. History of legal issue / charges:  n/a  Medical History/Surgical History: reviewed Past Medical History:  Diagnosis Date   CKD (chronic kidney disease)    Diabetes mellitus without complication (HCC)    Hyperlipidemia    Hypertension     Past Surgical History:  Procedure Laterality Date   BREAST BIOPSY     BREAST EXCISIONAL BIOPSY Left     Medications: Current Outpatient Medications  Medication Sig Dispense Refill   ACETAMINOPHEN PO Take 650 mg by mouth every 6 (six) hours as needed for headache or moderate pain.     albuterol (VENTOLIN HFA) 108 (90 Base) MCG/ACT inhaler Inhale 2 puffs into the lungs every 6 (six) hours as needed for wheezing or shortness of breath. 1 g 3   allopurinol (ZYLOPRIM) 100 MG tablet TAKE 1 TABLET BY MOUTH EVERY DAY 90 tablet 0   amLODipine (NORVASC) 10 MG tablet TAKE 1 TABLET BY MOUTH EVERY DAY 90 tablet 1   benzonatate (TESSALON) 200 MG capsule Take 1 capsule (200 mg total) by mouth 2 (two) times daily as needed for cough. 20 capsule 0   Biotin 5000 MCG CAPS Take 1 capsule by mouth daily.     Cholecalciferol (VITAMIN D) 50 MCG (2000 UT) CAPS Take 4,000 Units by mouth daily.     Continuous Glucose Sensor (FREESTYLE LIBRE 3 SENSOR) MISC 1 Device by Does not apply route daily in the afternoon. Place 1 sensor on the skin every 14 days. Use to check glucose continuously 6 each 3   Docusate Sodium (COLACE PO) Take 1 capsule by mouth daily.     Evolocumab (REPATHA SURECLICK) 140 MG/ML SOAJ Inject 140 mg into the skin every 14 (fourteen) days. 6 mL 3   ezetimibe (ZETIA) 10 MG tablet TAKE 1 TABLET BY MOUTH EVERY DAY  90 tablet 1   fluocinonide (LIDEX) 0.05 % external solution Apply topically in the morning and at bedtime.     insulin aspart (NOVOLOG FLEXPEN) 100 UNIT/ML FlexPen Max daily 70 units (Patient taking differently: Inject 13 Units into the skin 3 (three) times daily with meals. Max daily 70 units) 60 mL 3   insulin glargine, 2 Unit Dial, (TOUJEO MAX SOLOSTAR) 300 UNIT/ML Solostar Pen Inject 22 Units into the skin daily at 6 (six) AM. 30 mL 3   Insulin Pen Needle (PEN NEEDLES) 32G X 4 MM MISC 1 Device by Does not apply route in the  morning, at noon, in the evening, and at bedtime. 400 each 3   loratadine (CLARITIN) 10 MG tablet Take 10 mg by mouth daily as needed (seasonal allergies).     losartan (COZAAR) 50 MG tablet TAKE 1 TABLET BY MOUTH EVERY DAY 90 tablet 0   meclizine (ANTIVERT) 12.5 MG tablet Take 1 tablet (12.5 mg total) by mouth 3 (three) times daily as needed for dizziness. 30 tablet 0   Polyethyl Glycol-Propyl Glycol (SYSTANE FREE OP) Apply 1 drop to eye as needed.     Semaglutide, 1 MG/DOSE, 4 MG/3ML SOPN Inject 1 mg as directed once a week. 9 mL 3   vitamin B-12 (CYANOCOBALAMIN) 1000 MCG tablet Take 1,000 mcg by mouth daily.     No current facility-administered medications for this visit.    Allergies  Allergen Reactions   Lisinopril Cough   Atorvastatin Other (See Comments)    Liver function test elevation Other reaction(s): liver issues   Penicillins Hives and Itching    Did it involve swelling of the face/tongue/throat, SOB, or low BP? No Did it involve sudden or severe rash/hives, skin peeling, or any reaction on the inside of your mouth or nose? Yes Did you need to seek medical attention at a hospital or doctor's office? Yes When did it last happen?      2012 If all above answers are "NO", may proceed with cephalosporin use.   Rosuvastatin Other (See Comments)    Myalgias / leg cramps   Sulfamethoxazole-Trimethoprim Hives    Other reaction(s): Unknown   Penicillin G      Other reaction(s): Unknown    Diagnoses:  F43.23  Plan of Care: Recommend ongoing therapy.  Pt participated in setting treatment goals.   Pt wants to improve coping skills.  Plan to meet monthly.  Pt agrees with treatment plan.    Treatment Plan  (treatment plan target date:  11/18/2023) Client Abilities/Strengths  Pt is bright, engaging, and motivated for therapy.  Client Treatment Preferences  Individual therapy.  Client Statement of Needs  Improve copings skills and understand herself better. Improve self esteem.  Symptoms  Depressed or irritable mood. Excessive and/or unrealistic worry that is difficult to control occurring more days than not for at least 6 months about a number of events or activities. Hypervigilance (e.g., feeling constantly on edge, experiencing concentration difficulties, having trouble falling or staying asleep, exhibiting a general state of irritability). Low self-esteem. Problems Addressed  Unipolar Depression, Anxiety Goals 1. Alleviate depressive symptoms and return to previous level of effective functioning. 2. Appropriately grieve the loss in order to normalize mood and to return to previously adaptive level of functioning. Objective Learn and implement behavioral strategies to overcome depression. Target Date: 2023-11-18 Frequency: Monthly  Progress: 50 Modality: individual  Related Interventions Assist the client in developing skills that increase the likelihood of deriving pleasure from behavioral activation (e.g., assertiveness skills, developing an exercise plan, less internal/more external focus, increased social involvement); reinforce success. Engage the client in "behavioral activation," increasing his/her activity level and contact with sources of reward, while identifying processes that inhibit activation. use behavioral techniques such as instruction, rehearsal, role-playing, role reversal, as needed, to facilitate activity in the client's daily  life; reinforce success. 3. Develop healthy interpersonal relationships that lead to the alleviation and help prevent the relapse of depression. 4. Develop healthy thinking patterns and beliefs about self, others, and the world that lead to the alleviation and help prevent the relapse of depression. 5. Enhance ability to  effectively cope with the full variety of life's worries and anxieties. 6. Learn and implement coping skills that result in a reduction of anxiety and worry, and improved daily functioning. Objective Learn and implement problem-solving strategies for realistically addressing worries. Target Date: 2023-11-18 Frequency: Monthly  Progress: 50 Modality: individual  Related Interventions Assign the client a homework exercise in which he/she problem-solves a current problem (see Mastery of Your Anxiety and Worry: Workbook by Elenora Fender and Filbert Schilder or Generalized Anxiety Disorder by Elesa Hacker, and Filbert Schilder); review, reinforce success, and provide corrective feedback toward improvement. Teach the client problem-solving strategies involving specifically defining a problem, generating options for addressing it, evaluating the pros and cons of each option, selecting and implementing an optional action, and reevaluating and refining the action. Objective Learn and implement calming skills to reduce overall anxiety and manage anxiety symptoms. Target Date: 2023-11-18 Frequency: Monthly  Progress: 50 Modality: individual  Related Interventions Assign the client to read about progressive muscle relaxation and other calming strategies in relevant books or treatment manuals (e.g., Progressive Relaxation Training by Twana First; Mastery of Your Anxiety and Worry: Workbook by Earlie Counts). Assign the client homework each session in which he/she practices relaxation exercises daily, gradually applying them progressively from non-anxiety-provoking to anxiety-provoking situations; review  and reinforce success while providing corrective feedback toward improvement. Teach the client calming/relaxation skills (e.g., applied relaxation, progressive muscle relaxation, cue controlled relaxation; mindful breathing; biofeedback) and how to discriminate better between relaxation and tension; teach the client how to apply these skills to his/her daily life. 7. Recognize, accept, and cope with feelings of depression. 8. Reduce overall frequency, intensity, and duration of the anxiety so that daily functioning is not impaired. 9. Resolve the core conflict that is the source of anxiety. 10. Stabilize anxiety level while increasing ability to function on a daily basis. Diagnosis F43.23 Conditions For Discharge Achievement of treatment goals and objectives    Salomon Fick, LCSW

## 2022-11-26 ENCOUNTER — Ambulatory Visit: Payer: Medicare HMO | Admitting: Pharmacist

## 2022-11-26 DIAGNOSIS — E1169 Type 2 diabetes mellitus with other specified complication: Secondary | ICD-10-CM | POA: Diagnosis not present

## 2022-11-26 DIAGNOSIS — Z794 Long term (current) use of insulin: Secondary | ICD-10-CM | POA: Diagnosis not present

## 2022-11-26 DIAGNOSIS — I10 Essential (primary) hypertension: Secondary | ICD-10-CM

## 2022-11-26 DIAGNOSIS — T466X5A Adverse effect of antihyperlipidemic and antiarteriosclerotic drugs, initial encounter: Secondary | ICD-10-CM

## 2022-11-26 NOTE — Progress Notes (Signed)
Pharmacy Note  11/26/2022 Name: Hayley Case MRN: [id number] DOB: 1950-04-06  Subjective: Hayley Case is a 72 y.o. year old female who is a primary care patient of Olive Bass, FNP. Clinical Pharmacist Practitioner referral was placed to assist with medication and hyperlipidemia management.    Engaged with patient by telephone for follow up visit today.   Diabetes:  Managed by endocrinology - Dr Lonzo Cloud. Next appointment 12/2022 Current therapy: Toujeo 22 units at night and Novolog sliding scale based on blood glucose - max of 70 units per day.  Ozempic 1mg  weekly.   Uses Continuous Glucose Monitor - Freestyle Libre 3. Not connected to our practice currently. Per patient she needs to set up account thru [place] app. Gets from DME [facility]  Patient reports she has eye exam this year with Dr Randon Goldsmith - 07/16/2022 - notes are not recorded currently in her EMR with our office.   Hyperlipidemia / bilateral carotid artery stenosis / PAD: LDL goal per cardiology office < 55.  Current therapy: Reaptha 140mg  every 14 days ezetimibe 10mg  daily.   Past therapies: Atorvastatin caused increased LFTs requiring hospitalization. Rosuvastatin caused myalgias.  Started Praluent 150mg  every 14 days 06/17/2021 but stopped due to cost.  Switched to Repatha - gets assistance with medication copay cost with [employer] (good thru 12/16/2022) - due to renew fro next year  Hypertension: 2 of 3 last office blood pressures above goal of < 130/80 (CKD) but last office blood pressure was good.  Current hypertension therapy: losartan 50mg  daily and amlodipine 10mg  daily   BP Readings from Last 3 Encounters:  09/04/22 129/75  08/19/22 (!) 142/76  07/22/22 (!) 175/86   Does not have blood pressure cuff at home. We checked her Monia Pouch Medicare Benefits at last visit and her plan thru [facility] program does not cover blood pressure monitors.   Vaginal Itching:   Patient reports vaginal itching. She has tried over-the-counter treatments. Itching will improve for a few days but then return.   Objective: Review of patient status, including review of consultants reports, laboratory and other test data, was performed as part of comprehensive.  Lab Results  Component Value Date   CREATININE 2.26 (H) 10/07/2022   CREATININE 2.78 (H) 09/04/2022   CREATININE 1.98 (H) 09/18/2020    Lab Results  Component Value Date   HGBA1C 7.3 (A) 06/27/2022       Component Value Date/Time   CHOL 163 10/07/2022 0739   CHOL 114 05/06/2019 1222   TRIG 110.0 10/07/2022 0739   HDL 56.50 10/07/2022 0739   HDL 35 (L) 05/06/2019 1222   CHOLHDL 3 10/07/2022 0739   VLDL 22.0 10/07/2022 0739   LDLCALC 84 10/07/2022 0739   LDLCALC 54 05/06/2019 1222   LDLDIRECT 79.0 10/07/2022 0739     Clinical ASCVD: Yes  The 10-year ASCVD risk score (Arnett DK, et al., 2019) is: 31.2%   Values used to calculate the score:     Age: 50 years     Sex: Female     Is Non-Hispanic African American: Yes     Diabetic: Yes     Tobacco smoker: No     Systolic Blood Pressure: 153 mmHg     Is BP treated: Yes     HDL Cholesterol: 56.5 mg/dL     Total Cholesterol: 163 mg/dL    BP Readings from Last 3 Encounters:  09/04/22 129/75  08/19/22 (!) 142/76  07/22/22 (!) 175/86  Allergies  Allergen Reactions   Lisinopril Cough   Atorvastatin Other (See Comments)    Liver function test elevation Other reaction(s): liver issues   Penicillins Hives and Itching    Did it involve swelling of the face/tongue/throat, SOB, or low BP? No Did it involve sudden or severe rash/hives, skin peeling, or any reaction on the inside of your mouth or nose? Yes Did you need to seek medical attention at a hospital or doctor's office? Yes When did it last happen?      2012 If all above answers are "NO", may proceed with cephalosporin use.   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 04/21/2022   Primary osteoarthritis of right knee 03/31/2022   Type 2 diabetes mellitus with stage 4 chronic kidney disease, with long-term current use of insulin (HCC) 07/02/2021   Arthritis of hip 04/18/2021   Background diabetic retinopathy (HCC) 04/18/2021   Chronic kidney disease, stage 3b (HCC) 04/18/2021   Dyslipidemia 04/18/2021   Family history of malignant neoplasm of digestive organs 04/18/2021   Gouty arthritis 04/18/2021   Hearing loss 04/18/2021   Hyperglycemia due to type 2 diabetes mellitus (HCC) 04/18/2021   Long term (current) use of insulin (HCC) 04/18/2021   Obstructive sleep apnea (adult) (pediatric) 04/18/2021   Peripheral arterial disease (HCC) 04/18/2021   History of colonic polyps 04/18/2021   Flank pain 10/14/2019   Type 2 diabetes mellitus with diabetic polyneuropathy, with long-term current use of insulin (HCC) 12/01/2018   Type 2 diabetes mellitus with retinopathy, with long-term current use of insulin (HCC) 12/01/2018   Type 2 diabetes mellitus with stage 3b chronic kidney disease, with long-term current use of insulin (HCC) 11/30/2018   Non-intractable vomiting    Acute hepatitis 07/13/2018   Acute on chronic renal insufficiency 07/12/2018   Diabetes mellitus (HCC) 12/08/2016   Essential hypertension 12/08/2016   Hyperlipidemia associated with type 2 diabetes mellitus (HCC) 12/08/2016     Medication Assistance:  Re-Enrolled for [employer] Hyperlipidemia  - grant approved ($2500 - from 12/17/2022 thru 12/16/2023).      Assessment / Plan: Diabetes: At goal of < 7.5% Continue current insulin regimen.  Continue Ozempic 1mg  weekly.  Continue to follow  up with Dr Lonzo Cloud.  Continue to use Libre 3 sensors to check blood glucose 3 or more times per day.   Requested eye exam office notes from Dr Randon Goldsmith office to be faxed to [phone] Patient is also due UACR check for 2024 - note placed in appointment notes for PCP visit 11/28/2022  Hyperlipidemia / PAD / bilateral artery stenosis: LDL goal per cardiology office < 55 Continue Repatha 140mg  every 14 days and ezetimibe 10mg  daily.  Reapplied for [employer] today   Hypertension:  Continue amlodipine 10mg  daily and losartan 50mg  daily.  Vaginal Itching:  Set up appointment with PCP for 11/28/2022  Medication management:  Reviewed and updated medication list Reviewed refill history and adherence  Follow Up:  Telephone follow up appointment with care management team member scheduled for:  January 2025   Henrene Pastor, PharmD Clinical Pharmacist [facility]  - [facility] [phone]

## 2022-11-28 ENCOUNTER — Other Ambulatory Visit (HOSPITAL_COMMUNITY)
Admission: RE | Admit: 2022-11-28 | Discharge: 2022-11-28 | Disposition: A | Discharge status: Home / Self Care | Payer: Medicare HMO | Source: Ambulatory Visit | Attending: Family | Admitting: Family

## 2022-11-28 ENCOUNTER — Encounter: Payer: Self-pay | Admitting: Family

## 2022-11-28 ENCOUNTER — Ambulatory Visit (INDEPENDENT_AMBULATORY_CARE_PROVIDER_SITE_OTHER): Payer: Medicare HMO | Admitting: Family

## 2022-11-28 VITALS — BP 138/74 | HR 94 | Resp 18 | Ht 64.0 in | Wt 185.8 lb

## 2022-11-28 DIAGNOSIS — Z794 Long term (current) use of insulin: Secondary | ICD-10-CM | POA: Diagnosis not present

## 2022-11-28 DIAGNOSIS — Z23 Encounter for immunization: Secondary | ICD-10-CM | POA: Diagnosis not present

## 2022-11-28 DIAGNOSIS — E1122 Type 2 diabetes mellitus with diabetic chronic kidney disease: Secondary | ICD-10-CM

## 2022-11-28 DIAGNOSIS — N898 Other specified noninflammatory disorders of vagina: Secondary | ICD-10-CM

## 2022-11-28 DIAGNOSIS — N1832 Chronic kidney disease, stage 3b: Secondary | ICD-10-CM | POA: Diagnosis not present

## 2022-11-28 LAB — MICROALBUMIN / CREATININE URINE RATIO
Creatinine,U: 196.3 mg/dL
Microalb Creat Ratio: 55.8 mg/g — ABNORMAL HIGH (ref 0.0–30.0)
Microalb, Ur: 109.5 mg/dL — ABNORMAL HIGH (ref 0.0–1.9)

## 2022-11-28 MED ORDER — FLUCONAZOLE 150 MG PO TABS: ORAL_TABLET | ORAL | 0 refills | Status: DC

## 2022-11-28 MED ORDER — TERCONAZOLE 0.4 % VA CREA: 1.0000 | TOPICAL_CREAM | Freq: Every day | VAGINAL | 0 refills | Status: DC

## 2022-11-28 NOTE — Progress Notes (Signed)
Hayley Case is a 72 y.o. 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 04/21/2022   Primary osteoarthritis of right knee 03/31/2022   Type 2 diabetes mellitus with stage 4 chronic kidney disease, with long-term current use of insulin (HCC) 07/02/2021   Arthritis of hip 04/18/2021   Background diabetic retinopathy (HCC) 04/18/2021   Chronic kidney disease, stage 3b (HCC) 04/18/2021   Dyslipidemia 04/18/2021   Family history of malignant neoplasm of digestive organs 04/18/2021   Gouty arthritis 04/18/2021   Hearing loss 04/18/2021   Hyperglycemia due to type 2 diabetes mellitus (HCC) 04/18/2021   Long term (current) use of insulin (HCC) 04/18/2021   Obstructive sleep apnea (adult) (pediatric) 04/18/2021   Peripheral arterial disease (HCC) 04/18/2021   History of colonic polyps 04/18/2021   Flank pain 10/14/2019   Type 2 diabetes mellitus with diabetic polyneuropathy, with long-term current use of insulin (HCC) 12/01/2018   Type 2 diabetes mellitus with retinopathy, with long-term current use of insulin (HCC) 12/01/2018   Type 2 diabetes mellitus with stage 3b chronic kidney disease, with long-term current use of insulin (HCC) 11/30/2018   Non-intractable vomiting    Acute hepatitis 07/13/2018   Acute on chronic renal insufficiency 07/12/2018   Diabetes mellitus (HCC) 12/08/2016   Essential hypertension 12/08/2016   Hyperlipidemia associated with type 2 diabetes mellitus (HCC) 12/08/2016    Current Outpatient Medications  Medication Sig Dispense Refill   ACETAMINOPHEN PO Take 650 mg by mouth every 6 (six) hours as needed for headache or moderate pain.     albuterol (VENTOLIN HFA) 108 (90 Base) MCG/ACT inhaler Inhale 2 puffs into the lungs every 6 (six) hours as needed for wheezing or shortness of breath. 1 g 3   allopurinol (ZYLOPRIM) 100 MG tablet TAKE 1 TABLET BY MOUTH  EVERY DAY 90 tablet 0   amLODipine (NORVASC) 10 MG tablet TAKE 1 TABLET BY MOUTH EVERY DAY 90 tablet 1   benzonatate (TESSALON) 200 MG capsule Take 1 capsule (200 mg total) by mouth 2 (two) times daily as needed for cough. 20 capsule 0   Biotin 5000 MCG CAPS Take 1 capsule by mouth daily.     Cholecalciferol (VITAMIN D) 50 MCG (2000 UT) CAPS Take 4,000 Units by mouth daily.     Continuous Glucose Sensor (FREESTYLE LIBRE 3 SENSOR) MISC 1 Device by Does not apply route daily in the afternoon. Place 1 sensor on the skin every 14 days. Use to check glucose continuously 6 each 3   Docusate Sodium (COLACE PO) Take 1 capsule by mouth daily.     Evolocumab (REPATHA SURECLICK) 140 MG/ML SOAJ Inject 140 mg into the skin every 14 (fourteen) days. 6 mL 3   ezetimibe (ZETIA) 10 MG tablet TAKE 1 TABLET BY MOUTH EVERY DAY 90 tablet 1   fluconazole (DIFLUCAN) 150 MG tablet Take 1 tablet as directed; repeat after 72 hours 2 tablet 0   fluocinonide (LIDEX) 0.05 % external solution Apply topically in the morning and at bedtime.     insulin aspart (NOVOLOG FLEXPEN) 100 UNIT/ML FlexPen Max daily 70 units (Patient taking differently: Inject 13 Units into the skin 3 (three) times daily with meals. Max daily 70 units) 60 mL 3   insulin glargine, 2 Unit Dial, (TOUJEO MAX SOLOSTAR) 300 UNIT/ML Solostar Pen Inject 22 Units into the skin daily at 6 (six) AM. 30 mL 3  Insulin Pen Needle (PEN NEEDLES) 32G X 4 MM MISC 1 Device by Does not apply route in the morning, at noon, in the evening, and at bedtime. 400 each 3   loratadine (CLARITIN) 10 MG tablet Take 10 mg by mouth daily as needed (seasonal allergies).     losartan (COZAAR) 50 MG tablet TAKE 1 TABLET BY MOUTH EVERY DAY 90 tablet 0   meclizine (ANTIVERT) 12.5 MG tablet Take 1 tablet (12.5 mg total) by mouth 3 (three) times daily as needed for dizziness. 30 tablet 0   Polyethyl Glycol-Propyl Glycol (SYSTANE FREE OP) Apply 1 drop to eye as needed.     Semaglutide, 1  MG/DOSE, 4 MG/3ML SOPN Inject 1 mg as directed once a week. 9 mL 3   terconazole (TERAZOL 7) 0.4 % vaginal cream Place 1 applicator vaginally at bedtime. 45 g 0   vitamin B-12 (CYANOCOBALAMIN) 1000 MCG tablet Take 1,000 mcg by mouth daily.     No current facility-administered medications for this visit.    Allergies: Lisinopril, Atorvastatin, Penicillins, Rosuvastatin, Sulfamethoxazole-trimethoprim, and Penicillin g  Past Medical History:  Diagnosis Date   CKD (chronic kidney disease)    Diabetes mellitus without complication (HCC)    Hyperlipidemia    Hypertension     Past Surgical History:  Procedure Laterality Date   BREAST BIOPSY     BREAST EXCISIONAL BIOPSY Left     Family History  Problem Relation Age of Onset   Skin cancer Mother    Heart attack Father    Colon cancer Sister     Social History   Tobacco Use   Smoking status: Never   Smokeless tobacco: Never  Substance Use Topics   Alcohol use: No    Subjective:   Concerned about persisting vaginal itching; notes symptoms have been present for " a while"/ admits blood sugar is continuing to fluctuate; does have appointment scheduled with her endocrinologist scheduled for next week;    Objective:  Vitals:   11/28/22 0859  BP: 138/74  Pulse: 94  Resp: 18  SpO2: 97%  Weight: 185 lb 12.8 oz (84.3 kg)  Height: 5\' 4"  (1.626 m)    General: Well developed, well nourished, in no acute distress  Skin : Warm and dry.  Head: Normocephalic and atraumatic  Lungs: Respirations unlabored;  Neurologic: Alert and oriented; speech intact; face symmetrical; moves all extremities well; CNII-XII intact without focal deficit  Pelvic exam- erythema noted/ no vaginal discharge Assessment:  1. Type 2 diabetes mellitus with stage 3b chronic kidney disease, with long-term current use of insulin (HCC)   2. Vaginal itching   3. Need for influenza vaccination     Plan:  Suspect yeast infection; check vaginal swab; use Diflucan as  directed; can use Terazol as needed; keep planned follow up with her endocrinologist;  Flu shot updated as well.   No follow-ups on file.  Orders Placed This Encounter  Procedures   Flu Vaccine Trivalent High Dose (Fluad)   Urine Microalbumin w/creat. ratio    Requested Prescriptions   Signed Prescriptions Disp Refills   fluconazole (DIFLUCAN) 150 MG tablet 2 tablet 0    Sig: Take 1 tablet as directed; repeat after 72 hours   terconazole (TERAZOL 7) 0.4 % vaginal cream 45 g 0    Sig: Place 1 applicator vaginally at bedtime.

## 2022-12-01 LAB — CERVICOVAGINAL ANCILLARY ONLY
Bacterial Vaginitis (gardnerella): NEGATIVE
Candida Glabrata: NEGATIVE
Candida Vaginitis: NEGATIVE
Chlamydia: NEGATIVE
Comment: NEGATIVE
Comment: NEGATIVE
Comment: NEGATIVE
Comment: NEGATIVE
Comment: NEGATIVE
Comment: NORMAL
Neisseria Gonorrhea: NEGATIVE
Trichomonas: NEGATIVE

## 2022-12-12 ENCOUNTER — Other Ambulatory Visit: Payer: Self-pay | Admitting: Family Medicine

## 2022-12-16 ENCOUNTER — Ambulatory Visit: Payer: Medicare HMO | Admitting: Psychology

## 2022-12-16 DIAGNOSIS — F4323 Adjustment disorder with mixed anxiety and depressed mood: Secondary | ICD-10-CM | POA: Diagnosis not present

## 2022-12-16 NOTE — Progress Notes (Signed)
[facility] Counselor/Therapist Progress Note  Patient ID: ALEXZANDRIA OBERLE, MRN: [id number],    Date: 12/16/2022  Time Spent: 11:00am-11:55am   55 minutes   Treatment Type: Individual Therapy  Reported Symptoms: sadness  Mental Status Exam: Appearance:  Casual     Behavior: Appropriate  Motor: Normal  Speech/Language:  Normal Rate  Affect: Appropriate  Mood: normal  Thought process: normal  Thought content:   WNL  Sensory/Perceptual disturbances:   WNL  Orientation: oriented to person, place, time/date, and situation  Attention: Good  Concentration: Good  Memory: WNL  Fund of knowledge:  Good  Insight:   Good  Judgment:  Good  Impulse Control: Good   Risk Assessment: Danger to Self:  No Self-injurious Behavior: No Danger to Others: No Duty to Warn:no Physical Aggression / Violence:No  Access to Firearms a concern: No  Gang Involvement:No   Subjective: Pt present for face-to-face individual therapy via video.  Pt consents to telehealth video session and is aware of limitations and benefits of virtual sessions.   Location of pt: home Location of therapist: home office.   Pt talked about her Thanksgiving.  She and her son had a quiet Thanksgiving at home.   Pt talked about an extended family member passing away recently. The family member was only in his 19's.  Helped pt process her feelings and grief. Pt talked about her son and his health.  He is on dialysis and pt worries about him.   Pt talked about plans for Christmas.  She will be home with her son.  She gets sad that her other family will not be with her.    Worked on self care strategies. Provided supportive therapy.  Interventions: Cognitive Behavioral Therapy and Insight-Oriented  Diagnosis:  F43.23  Plan of Care: Recommend ongoing therapy.  Pt participated in setting treatment goals.   Pt wants to improve coping skills.  Plan to meet monthly.  Pt agrees with treatment plan.    Treatment  Plan  (treatment plan target date:  11/18/2023) Client Abilities/Strengths  Pt is bright, engaging, and motivated for therapy.  Client Treatment Preferences  Individual therapy.  Client Statement of Needs  Improve copings skills and understand herself better. Improve self esteem.  Symptoms  Depressed or irritable mood. Excessive and/or unrealistic worry that is difficult to control occurring more days than not for at least 6 months about a number of events or activities. Hypervigilance (e.g., feeling constantly on edge, experiencing concentration difficulties, having trouble falling or staying asleep, exhibiting a general state of irritability). Low self-esteem. Problems Addressed  Unipolar Depression, Anxiety Goals 1. Alleviate depressive symptoms and return to previous level of effective functioning. 2. Appropriately grieve the loss in order to normalize mood and to return to previously adaptive level of functioning. Objective Learn and implement behavioral strategies to overcome depression. Target Date: 2023-11-18 Frequency: Monthly  Progress: 50 Modality: individual  Related Interventions Assist the client in developing skills that increase the likelihood of deriving pleasure from behavioral activation (e.g., assertiveness skills, developing an exercise plan, less internal/more external focus, increased social involvement); reinforce success. Engage the client in "behavioral activation," increasing his/her activity level and contact with sources of reward, while identifying processes that inhibit activation. use behavioral techniques such as instruction, rehearsal, role-playing, role reversal, as needed, to facilitate activity in the client's daily life; reinforce success. 3. Develop healthy interpersonal relationships that lead to the alleviation and help prevent the relapse of depression. 4. Develop healthy thinking patterns and beliefs  about self, others, and the world that lead to the  alleviation and help prevent the relapse of depression. 5. Enhance ability to effectively cope with the full variety of life's worries and anxieties. 6. Learn and implement coping skills that result in a reduction of anxiety and worry, and improved daily functioning. Objective Learn and implement problem-solving strategies for realistically addressing worries. Target Date: 2023-11-18 Frequency: Monthly  Progress: 50 Modality: individual  Related Interventions Assign the client a homework exercise in which he/she problem-solves a current problem (see Mastery of Your Anxiety and Worry: Workbook by Elenora Fender and Filbert Schilder or Generalized Anxiety Disorder by Elesa Hacker, and Filbert Schilder); review, reinforce success, and provide corrective feedback toward improvement. Teach the client problem-solving strategies involving specifically defining a problem, generating options for addressing it, evaluating the pros and cons of each option, selecting and implementing an optional action, and reevaluating and refining the action. Objective Learn and implement calming skills to reduce overall anxiety and manage anxiety symptoms. Target Date: 2023-11-18 Frequency: Monthly  Progress: 50 Modality: individual  Related Interventions Assign the client to read about progressive muscle relaxation and other calming strategies in relevant books or treatment manuals (e.g., Progressive Relaxation Training by Twana First; Mastery of Your Anxiety and Worry: Workbook by Earlie Counts). Assign the client homework each session in which he/she practices relaxation exercises daily, gradually applying them progressively from non-anxiety-provoking to anxiety-provoking situations; review and reinforce success while providing corrective feedback toward improvement. Teach the client calming/relaxation skills (e.g., applied relaxation, progressive muscle relaxation, cue controlled relaxation; mindful breathing; biofeedback) and how  to discriminate better between relaxation and tension; teach the client how to apply these skills to his/her daily life. 7. Recognize, accept, and cope with feelings of depression. 8. Reduce overall frequency, intensity, and duration of the anxiety so that daily functioning is not impaired. 9. Resolve the core conflict that is the source of anxiety. 10. Stabilize anxiety level while increasing ability to function on a daily basis. Diagnosis F43.23 Conditions For Discharge Achievement of treatment goals and objectives   Salomon Fick, LCSW

## 2022-12-19 ENCOUNTER — Other Ambulatory Visit: Payer: Self-pay | Admitting: Family

## 2022-12-21 ENCOUNTER — Other Ambulatory Visit: Payer: Self-pay | Admitting: Internal Medicine

## 2022-12-29 ENCOUNTER — Other Ambulatory Visit: Payer: Self-pay

## 2022-12-29 ENCOUNTER — Encounter: Payer: Self-pay | Admitting: Internal Medicine

## 2022-12-29 ENCOUNTER — Ambulatory Visit (INDEPENDENT_AMBULATORY_CARE_PROVIDER_SITE_OTHER): Payer: Medicare HMO | Admitting: Internal Medicine

## 2022-12-29 VITALS — BP 130/72 | HR 80 | Ht 64.0 in | Wt 185.0 lb

## 2022-12-29 DIAGNOSIS — E1122 Type 2 diabetes mellitus with diabetic chronic kidney disease: Secondary | ICD-10-CM

## 2022-12-29 DIAGNOSIS — Z794 Long term (current) use of insulin: Secondary | ICD-10-CM

## 2022-12-29 DIAGNOSIS — N184 Chronic kidney disease, stage 4 (severe): Secondary | ICD-10-CM | POA: Diagnosis not present

## 2022-12-29 LAB — POCT GLYCOSYLATED HEMOGLOBIN (HGB A1C): Hemoglobin A1C: 7.3 % — AB (ref 4.0–5.6)

## 2022-12-29 MED ORDER — PEN NEEDLES 32G X 4 MM MISC: 1.0000 | Freq: Four times a day (QID) | 3 refills | Status: AC

## 2022-12-29 MED ORDER — NOVOLOG FLEXPEN 100 UNIT/ML ~~LOC~~ SOPN: PEN_INJECTOR | SUBCUTANEOUS | 3 refills | Status: AC

## 2022-12-29 MED ORDER — SEMAGLUTIDE (2 MG/DOSE) 8 MG/3ML ~~LOC~~ SOPN: 2.0000 mg | PEN_INJECTOR | SUBCUTANEOUS | 3 refills | Status: DC

## 2022-12-29 MED ORDER — TOUJEO MAX SOLOSTAR 300 UNIT/ML ~~LOC~~ SOPN: 20.0000 [IU] | PEN_INJECTOR | Freq: Every day | SUBCUTANEOUS | 3 refills | Status: DC

## 2022-12-29 NOTE — Patient Instructions (Addendum)
-   Increase Ozempic 2 mg, once weekly - Decrease Toujeo to 20 units once daily  - Change Novolog 10 units with each meal   -Novolog  correctional insulin: ADD extra units on insulin to your meal-time Humalog dose if your blood sugars are higher than 160. Use the scale below to help guide you:   Blood sugar before meal Number of units to inject  Less than 160 0 unit  161 -  190 1 units  191 -  220 2 units  221 -  250 3 units  251 -  280 4 units  281 -  310 5 units  311 -  340 6 units  341 -  370 7 units  371 -  400 8 units  401 - 430 9 units        HOW TO TREAT LOW BLOOD SUGARS (Blood sugar LESS THAN 70 MG/DL) Please follow the RULE OF 15 for the treatment of hypoglycemia treatment (when your (blood sugars are less than 70 mg/dL)   STEP 1: Take 15 grams of carbohydrates when your blood sugar is low, which includes:  3-4 GLUCOSE TABS  OR 3-4 OZ OF JUICE OR REGULAR SODA OR ONE TUBE OF GLUCOSE GEL    STEP 2: RECHECK blood sugar in 15 MINUTES STEP 3: If your blood sugar is still low at the 15 minute recheck --> then, go back to STEP 1 and treat AGAIN with another 15 grams of carbohydrates.

## 2022-12-29 NOTE — Progress Notes (Signed)
Name: Hayley Case  Age/ Sex: 72 y.o., female   MRN/ DOB: [id number], August 29, 1950     PCP: Olive Bass, FNP   Reason for Endocrinology Evaluation: Type 2 Diabetes Mellitus  Initial Endocrine Consultative Visit: 11/30/2018    PATIENT IDENTIFIER: Hayley Case is a 72 y.o. female with a past medical history of HTN, T2DM, PAD, coronary artery disease and Dyslipidemia . The patient has followed with Endocrinology clinic since 11/30/2018 for consultative assistance with management of her diabetes.  DIABETIC HISTORY:  [name] was diagnosed with T2DM > 20 yrs ago. She was on metformin, Trulicity, and Byetta. Has been on insulin since ~ 2010.  Her hemoglobin A1c has ranged from 7.6 % in 2019, peaking at 10.7 % in 2020.     On her initial visit to our clinic, she had an A1c of 11.8%. She was on lantus/humalog but was not taking regularly, switched to insulin mix for ease of take.    Was on Lipitor but caused elevated LFT's requiring hospitalization.   Moved from [place] to help her son who is on peritoneal dialysis. Has lost another son to MI    By 06/2019 we attempted to put her on the V-Go but was cost prohibitive.   By 09/2019 we switched insulin mix to MDi regimen due to persistent hyperglycemia   Switch Rybelsus to Ozempic 06/2022  SUBJECTIVE:   During the last visit (06/27/2022): A1c 7.3%   Today (12/29/2022): Hayley Case is here for a follow up on diabetes.  She checks her blood sugars multiple times daily,through CGM. The patient has had hypoglycemic episodes since the last clinic visit.      Patient follows with cardiology for bilateral carotid artery stenosis, dyslipidemia, HTN, and peripheral artery disease  son continues with hemodialysis  Has occasional nausea  Has chronic constipation , takes colace  Has occasional neck pain  Has occasional incontinence and wears a pad  HOME DIABETES REGIMEN:  Ozempic 1 mg weekly Toujeo 22 units once  daily Humalog 12 units with each meal CF: Humalog (BG -130/30)      Statin: Off due to elevated LFT's and myalgias  ACE-I/ARB: Yes     CONTINUOUS GLUCOSE MONITORING RECORD INTERPRETATION: Unable to download          DIABETIC COMPLICATIONS: Microvascular complications:  CKD III, DR , neuropathy  Last eye exam: Completed 06/2021   Macrovascular complications:   PVD Denies: CAD, CVA   HISTORY:  Past Medical History:  Past Medical History:  Diagnosis Date   CKD (chronic kidney disease)    Diabetes mellitus without complication (HCC)    Hyperlipidemia    Hypertension    Past Surgical History:  Past Surgical History:  Procedure Laterality Date   BREAST BIOPSY     BREAST EXCISIONAL BIOPSY Left    Social History:  reports that she has never smoked. She has never used smokeless tobacco. She reports that she does not drink alcohol and does not use drugs. Family History:  Family History  Problem Relation Age of Onset   Skin cancer Mother    Heart attack Father    Colon cancer Sister      HOME MEDICATIONS: Allergies as of 12/29/2022       Reactions   Lisinopril Cough   Atorvastatin Other (See Comments)   Liver function test elevation Other reaction(s): liver issues   Penicillins Hives, Itching   Did it involve swelling of the face/tongue/throat, SOB, or low BP? No  Did it involve sudden or severe rash/hives, skin peeling, or any reaction on the inside of your mouth or nose? Yes Did you need to seek medical attention at a hospital or doctor's office? Yes When did it last happen?      2012 If all above answers are "NO", may proceed with cephalosporin use.   Rosuvastatin Other (See Comments)   Myalgias / leg cramps   Sulfamethoxazole-trimethoprim Hives   Other reaction(s): Unknown   Penicillin G    Other reaction(s): Unknown        Medication List        Accurate as of December 29, 2022  9:41 AM. If you have any questions, ask your nurse or doctor.           ACETAMINOPHEN PO Take 650 mg by mouth every 6 (six) hours as needed for headache or moderate pain.   albuterol 108 (90 Base) MCG/ACT inhaler Commonly known as: VENTOLIN HFA Inhale 2 puffs into the lungs every 6 (six) hours as needed for wheezing or shortness of breath.   allopurinol 100 MG tablet Commonly known as: ZYLOPRIM TAKE 1 TABLET BY MOUTH EVERY DAY   amLODipine 10 MG tablet Commonly known as: NORVASC TAKE 1 TABLET BY MOUTH EVERY DAY   benzonatate 200 MG capsule Commonly known as: TESSALON Take 1 capsule (200 mg total) by mouth 2 (two) times daily as needed for cough.   Biotin 5000 MCG Caps Take 1 capsule by mouth daily.   COLACE PO Take 1 capsule by mouth daily.   cyanocobalamin 1000 MCG tablet Commonly known as: VITAMIN B12 Take 1,000 mcg by mouth daily.   ezetimibe 10 MG tablet Commonly known as: ZETIA TAKE 1 TABLET BY MOUTH EVERY DAY   fluconazole 150 MG tablet Commonly known as: Diflucan Take 1 tablet as directed; repeat after 72 hours   fluocinonide 0.05 % external solution Commonly known as: LIDEX Apply topically in the morning and at bedtime.   FreeStyle Libre 3 Sensor Misc 1 Device by Does not apply route daily in the afternoon. Place 1 sensor on the skin every 14 days. Use to check glucose continuously   loratadine 10 MG tablet Commonly known as: CLARITIN Take 10 mg by mouth daily as needed (seasonal allergies).   losartan 50 MG tablet Commonly known as: COZAAR TAKE 1 TABLET BY MOUTH EVERY DAY   meclizine 12.5 MG tablet Commonly known as: ANTIVERT Take 1 tablet (12.5 mg total) by mouth 3 (three) times daily as needed for dizziness.   NovoLOG FlexPen 100 UNIT/ML FlexPen Generic drug: insulin aspart Max daily 70 units What changed:  how much to take how to take this when to take this   Pen Needles 32G X 4 MM Misc 1 Device by Does not apply route in the morning, at noon, in the evening, and at bedtime.   Repatha SureClick  140 MG/ML Soaj Generic drug: Evolocumab Inject 140 mg into the skin every 14 (fourteen) days.   Semaglutide (1 MG/DOSE) 4 MG/3ML Sopn Inject 1 mg as directed once a week.   SYSTANE FREE OP Apply 1 drop to eye as needed.   terconazole 0.4 % vaginal cream Commonly known as: TERAZOL 7 Place 1 applicator vaginally at bedtime.   Toujeo Max SoloStar 300 UNIT/ML Solostar Pen Generic drug: insulin glargine (2 Unit Dial) Inject 18 Units into the skin daily at 6 (six) AM.   Vitamin D 50 MCG (2000 UT) Caps Take 4,000 Units by mouth daily.  OBJECTIVE:   Vital Signs: BP 130/72 (BP Location: Left Arm, Patient Position: Sitting, Cuff Size: Large)   Pulse 80   Ht 5\' 4"  (1.626 m)   Wt 185 lb (83.9 kg)   SpO2 95%   BMI 31.76 kg/m   Wt Readings from Last 3 Encounters:  12/29/22 185 lb (83.9 kg)  11/28/22 185 lb 12.8 oz (84.3 kg)  09/04/22 181 lb 12.8 oz (82.5 kg)     Exam: General: Pt appears well and is in NAD  Lungs: Clear with good BS bilat  Heart: RRR   Extremities: No  pretibial edema.  Neuro: MS is good with appropriate affect, pt is alert and Ox3       DM foot exam: 06/27/2022   The skin of the feet is intact without sores or ulcerations. The pedal pulses are decreased The sensation is decreased to a screening 5.07, 10 gram monofilament bilaterally     DATA REVIEWED:  Lab Results  Component Value Date   HGBA1C 7.3 (A) 12/29/2022   HGBA1C 7.3 (A) 06/27/2022   HGBA1C 7.1 (A) 12/20/2021    Latest Reference Range & Units 10/07/22 07:39  Sodium 135 - 145 mEq/L 141  Potassium 3.5 - 5.1 mEq/L 4.2  Chloride 96 - 112 mEq/L 109  CO2 19 - 32 mEq/L 24  Glucose 70 - 99 mg/dL 409 (H)  BUN 6 - 23 mg/dL 35 (H)  Creatinine 8.11 - 1.20 mg/dL 9.14 (H)  Calcium 8.4 - 10.5 mg/dL 9.3  GFR >78.29 mL/min 21.31 (L)    Latest Reference Range & Units 10/07/22 07:39 11/28/22 09:40  Total CHOL/HDL Ratio  3   Cholesterol 0 - 200 mg/dL 562   HDL Cholesterol >13.08 mg/dL  65.78   LDL (calc) 0 - 99 mg/dL 84   Direct LDL mg/dL 46.9   MICROALB/CREAT RATIO 0.0 - 30.0 mg/g  55.8 (H)  NonHDL  106.23   Triglycerides 0.0 - 149.0 mg/dL 629.5   VLDL 0.0 - 28.4 mg/dL 13.2   (H): Data is abnormally high    ASSESSMENT / PLAN / RECOMMENDATIONS:   1) Type 2 Diabetes Mellitus, Optimally controlled, With CKD IV, retinopathy ,neuropathic  and macrovascular complications - Most recent A1c of 7.3%. Goal A1c < 7.5 %.    -A1c stable -She is tolerating Ozempic, will increase as below -I will decrease her insulin as below to prevent hypoglycemia -Patient with imperfect adherence to Humalog which results in glycemic excursions - We have attempted to put her on the V-go but that was cost prohibitive  MEDICATIONS: - Increase Ozempic 2 mg weekly - Decreased Toujeo to 20 units once daily - Decrease  Novolog 10 units with each meal - CF: Novolog  (BG -130/30)     EDUCATION / INSTRUCTIONS: BG monitoring instructions: Patient is instructed to check her blood sugars 3 times a day, before meals I reviewed the Rule of 15 for the treatment of hypoglycemia in detail with the patient. Literature supplied.     F/U in 4-6 months    Signed electronically by: Lyndle Herrlich, MD  [facility] Endocrinology  [facility] [address] [place] [place], [place] [address] Phone: [phone] FAX: [phone]   CC: Olive Bass, FNP [address] [place] [address] Phone: [phone]  Fax: [phone]  Return to Endocrinology clinic as below: Future Appointments  Date Time Provider Department Center  01/22/2023 11:00 AM Bauert, Rella Larve, LCSW [facility] None  02/16/2023 10:30 AM [facility]  [facility] PHARMACIST [facility]  02/19/2023 11:00 AM Bauert, Terri W, LCSW [facility] None

## 2022-12-30 ENCOUNTER — Encounter: Payer: Self-pay | Admitting: Family

## 2022-12-30 NOTE — Telephone Encounter (Signed)
 Care team updated and letter sent for eye exam notes.

## 2023-01-22 ENCOUNTER — Ambulatory Visit (INDEPENDENT_AMBULATORY_CARE_PROVIDER_SITE_OTHER): Payer: Medicare HMO | Admitting: Psychology

## 2023-01-22 DIAGNOSIS — F4323 Adjustment disorder with mixed anxiety and depressed mood: Secondary | ICD-10-CM | POA: Diagnosis not present
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
## 2023-02-04 ENCOUNTER — Other Ambulatory Visit: Payer: Self-pay | Admitting: Cardiovascular Disease

## 2023-02-04 NOTE — Telephone Encounter (Signed)
Refill request

## 2023-02-16 ENCOUNTER — Ambulatory Visit: Payer: Medicare HMO | Admitting: Pharmacist

## 2023-02-16 DIAGNOSIS — E785 Hyperlipidemia, unspecified: Secondary | ICD-10-CM

## 2023-02-16 DIAGNOSIS — N1832 Chronic kidney disease, stage 3b: Secondary | ICD-10-CM

## 2023-02-16 DIAGNOSIS — I10 Essential (primary) hypertension: Secondary | ICD-10-CM

## 2023-02-16 DIAGNOSIS — M791 Myalgia, unspecified site: Secondary | ICD-10-CM

## 2023-02-16 NOTE — Progress Notes (Signed)
Pharmacy Note  02/16/2023 Name: Hayley Case MRN: [id number] DOB: 1950/09/15  Subjective: Hayley Case is a 73 y.o. year old female who is a primary care patient of Olive Bass, FNP. Clinical Pharmacist Practitioner referral was placed to assist with medication and hyperlipidemia management.    Engaged with patient by telephone for follow up visit today.   Diabetes:  Managed by endocrinology - Dr Lonzo Cloud. Next appointment 04/2023 Per last visit with Dr Lonzo Cloud the following medication changes were recommended:  - Increase Ozempic 2 mg weekly - Decreased Toujeo to 20 units once daily - Decrease  Novolog 10 units with each meal - CF: Novolog  (BG -130/30)  Patient has increased Ozmepic, but she is only taking 18 units of Toujeo. She also is still taking Novolog 13 units before meals (eats 2 meals per day) + correction based on scale provided.  She also reports that she needs Ozempic Rx transferred from [facility] on [address] in [place] to [facility] [address] in [place].   Eye exam is currently up to date  Wt Readings from Last 3 Encounters:  12/29/22 185 lb (83.9 kg)  11/28/22 185 lb 12.8 oz (84.3 kg)  09/04/22 181 lb 12.8 oz (82.5 kg)   Weight on 02/10/2023 at nephrology office was 182 lbs with BMI of 31.31; patient has lost about 10 lbs since changed from Rybelsus to Ozempic.   Uses Continuous Glucose Monitor - Freestyle Libre 3. She has set up account with [employer] since our last visit and I was able to see her recent blood glucose trends - see below. Priscille Heidelberg 3 from DME - [facility]   Date of Download: 02/16/2023 % Time CGM is active: 36% Average Glucose: 176 mg/dL Glucose Management Indicator: 7.5%  Glucose Variability: 28.7% (goal <36%) Time in Goal:  - Time in range 70-180: 60% - Time above range: 40% - Time below range: 0%  Observed patterns: Blood glucose usually > 180 after noon and evening meals.         Hyperlipidemia / bilateral carotid artery stenosis / PAD: LDL goal per cardiology office < 55.  Current therapy: Reaptha 140mg  every 14 days ezetimibe 10mg  daily.   Past therapies: Atorvastatin caused increased LFTs requiring hospitalization. Rosuvastatin caused myalgias.   Started Praluent 150mg  every 14 days 06/17/2021 but stopped due to cost.  Switched to Repatha - gets assistance with medication copay cost with [employer] (good thru 12/16/2023) Today patient reports she feels like she has indigestion after taking ezetimibe before bedtime - does not occur daily.   Hypertension:  last three office blood pressures have been at or close to goal of < 130/80 (CKD) Last nephrology appointment - 02/10/2023 with Dr Ronalee Belts Current hypertension therapy: losartan 50mg  daily and amlodipine 10mg  daily   BP Readings from Last 3 Encounters:  12/29/22 130/72  11/28/22 138/74  09/04/22 129/75    Dysuria:  Patient states he nephrologist told her that he WBC was elevated at her visit 02/10/2023. Patient also reports that she thinks she might have a UTI - experiencing some burning with urination.   Objective: Review of patient status, including review of consultants reports, laboratory and other test data, was performed as part of comprehensive.  Lab Results  Component Value Date   CREATININE 2.26 (H) 10/07/2022   CREATININE 2.78 (H) 09/04/2022   CREATININE 1.98 (H) 09/18/2020    Lab Results  Component Value Date   HGBA1C 7.3 (A) 12/29/2022  Component Value Date/Time   CHOL 163 10/07/2022 0739   CHOL 114 05/06/2019 1222   TRIG 110.0 10/07/2022 0739   HDL 56.50 10/07/2022 0739   HDL 35 (L) 05/06/2019 1222   CHOLHDL 3 10/07/2022 0739   VLDL 22.0 10/07/2022 0739   LDLCALC 84 10/07/2022 0739   LDLCALC 54 05/06/2019 1222   LDLDIRECT 79.0 10/07/2022 0739     Clinical ASCVD: Yes  The 10-year ASCVD risk score (Arnett DK, et al., 2019) is: 24.1%   Values used  to calculate the score:     Age: 82 years     Sex: Female     Is Non-Hispanic African American: Yes     Diabetic: Yes     Tobacco smoker: No     Systolic Blood Pressure: 130 mmHg     Is BP treated: Yes     HDL Cholesterol: 56.5 mg/dL     Total Cholesterol: 163 mg/dL    BP Readings from Last 3 Encounters:  12/29/22 130/72  11/28/22 138/74  09/04/22 129/75     Allergies  Allergen Reactions   Lisinopril Cough   Atorvastatin Other (See Comments)    Liver function test elevation Other reaction(s): liver issues   Penicillins Hives and Itching    Did it involve swelling of the face/tongue/throat, SOB, or low BP? No Did it involve sudden or severe rash/hives, skin peeling, or any reaction on the inside of your mouth or nose? Yes Did you need to seek medical attention at a hospital or doctor's office? Yes When did it last happen?      2012 If all above answers are "NO", may proceed with cephalosporin use.   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 04/21/2022   Primary osteoarthritis of right knee 03/31/2022   Type 2 diabetes mellitus with stage 4 chronic kidney disease, with long-term current use of insulin (HCC) 07/02/2021   Arthritis of hip 04/18/2021   Background diabetic retinopathy (HCC) 04/18/2021   Chronic kidney disease, stage 3b (HCC) 04/18/2021   Dyslipidemia 04/18/2021   Family history of malignant neoplasm of digestive organs 04/18/2021   Gouty arthritis 04/18/2021   Hearing loss 04/18/2021   Hyperglycemia due to type 2 diabetes mellitus (HCC) 04/18/2021   Long term (current) use of insulin (HCC) 04/18/2021   Obstructive sleep apnea (adult)  (pediatric) 04/18/2021   Peripheral arterial disease (HCC) 04/18/2021   History of colonic polyps 04/18/2021   Flank pain 10/14/2019   Type 2 diabetes mellitus with diabetic polyneuropathy, with long-term current use of insulin (HCC) 12/01/2018   Type 2 diabetes mellitus with retinopathy, with long-term current use of insulin (HCC) 12/01/2018   Type 2 diabetes mellitus with stage 3b chronic kidney disease, with long-term current use of insulin (HCC) 11/30/2018   Non-intractable vomiting    Acute hepatitis 07/13/2018   Acute on chronic renal insufficiency 07/12/2018   Diabetes mellitus (HCC) 12/08/2016   Essential hypertension 12/08/2016   Hyperlipidemia associated with type 2 diabetes mellitus (HCC) 12/08/2016     Medication Assistance:  Re-Enrolled for [employer] Hyperlipidemia  - grant approved ($2500 - from 12/17/2022 thru 12/16/2023).  Assessment / Plan: Diabetes: Last A1c was at goal of < 7.5% but postprandial blood glucose has been elevated. Also not taking insulin regimen per last endo notes.  Will reach out to endo office to clarify insulin regimen.  Continue Ozempic 2mg  weekly - requested transfer from [facility] [place] [facility] per patient request.  Continue to follow up with Dr Lonzo Cloud.  Continue to use Libre 3 sensors to check blood glucose 3 or more times per day.    Hyperlipidemia / PAD / bilateral artery stenosis: LDL goal per cardiology office < 55 Continue Repatha 140mg  every 14 days and ezetimibe 10mg  daily.  Recommended she try taking ezetimibe with morning meal to see if indigestion improves.  Continue to use [employer] Grant - valid thru 12/2023   Hypertension:  Continue amlodipine 10mg  daily and losartan 50mg  daily. Check blood pressure 2 to 3 times per week.   Dysuria and elevated WBC:  Set up appointment with PCP for tomorrow 02/17/2023 at 11:20am  Medication management:  Reviewed and updated medication list Reviewed refill  history and adherence  Follow Up:  Telephone follow up appointment with care management team member scheduled for:  April / May 2025   Henrene Pastor, PharmD Clinical Pharmacist [facility]  - [facility] [place] [phone]

## 2023-02-17 ENCOUNTER — Ambulatory Visit (HOSPITAL_BASED_OUTPATIENT_CLINIC_OR_DEPARTMENT_OTHER)
Admission: RE | Admit: 2023-02-17 | Discharge: 2023-02-17 | Disposition: A | Discharge status: Home / Self Care | Payer: Medicare HMO | Source: Ambulatory Visit | Attending: Family | Admitting: Family

## 2023-02-17 ENCOUNTER — Ambulatory Visit (INDEPENDENT_AMBULATORY_CARE_PROVIDER_SITE_OTHER): Payer: Medicare HMO | Admitting: Family

## 2023-02-17 ENCOUNTER — Other Ambulatory Visit: Payer: Self-pay | Admitting: Family

## 2023-02-17 ENCOUNTER — Encounter: Payer: Self-pay | Admitting: Family

## 2023-02-17 VITALS — BP 138/86 | HR 94 | Temp 97.9°F | Ht 64.0 in | Wt 181.4 lb

## 2023-02-17 DIAGNOSIS — R0609 Other forms of dyspnea: Secondary | ICD-10-CM

## 2023-02-17 DIAGNOSIS — R300 Dysuria: Secondary | ICD-10-CM | POA: Diagnosis not present

## 2023-02-17 DIAGNOSIS — D729 Disorder of white blood cells, unspecified: Secondary | ICD-10-CM

## 2023-02-17 DIAGNOSIS — R0789 Other chest pain: Secondary | ICD-10-CM | POA: Diagnosis not present

## 2023-02-17 LAB — POCT URINALYSIS DIPSTICK
Bilirubin, UA: NEGATIVE
Blood, UA: NEGATIVE
Glucose, UA: NEGATIVE
Ketones, UA: NEGATIVE
Nitrite, UA: NEGATIVE
Protein, UA: POSITIVE — AB
Spec Grav, UA: 1.015 (ref 1.010–1.025)
Urobilinogen, UA: 0.2 U/dL
pH, UA: 5 (ref 5.0–8.0)

## 2023-02-17 LAB — CBC WITH DIFFERENTIAL/PLATELET
Basophils Absolute: 0 10*3/uL (ref 0.0–0.1)
Basophils Relative: 0.5 % (ref 0.0–3.0)
Eosinophils Absolute: 0.1 10*3/uL (ref 0.0–0.7)
Eosinophils Relative: 1.8 % (ref 0.0–5.0)
HCT: 36.1 % (ref 36.0–46.0)
Hemoglobin: 12.3 g/dL (ref 12.0–15.0)
Lymphocytes Relative: 27.8 % (ref 12.0–46.0)
Lymphs Abs: 2.1 10*3/uL (ref 0.7–4.0)
MCHC: 34 g/dL (ref 30.0–36.0)
MCV: 88 fL (ref 78.0–100.0)
Monocytes Absolute: 0.5 10*3/uL (ref 0.1–1.0)
Monocytes Relative: 6.4 % (ref 3.0–12.0)
Neutro Abs: 4.9 10*3/uL (ref 1.4–7.7)
Neutrophils Relative %: 63.5 % (ref 43.0–77.0)
Platelets: 324 10*3/uL (ref 150.0–400.0)
RBC: 4.1 Mil/uL (ref 3.87–5.11)
RDW: 13.9 % (ref 11.5–15.5)
WBC: 7.7 10*3/uL (ref 4.0–10.5)

## 2023-02-17 LAB — BRAIN NATRIURETIC PEPTIDE: Pro B Natriuretic peptide (BNP): 13 pg/mL (ref 0.0–100.0)

## 2023-02-17 NOTE — Progress Notes (Signed)
 Hayley Case is a 73 y.o. 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 04/21/2022   Primary osteoarthritis of right knee 03/31/2022   Type 2 diabetes mellitus with stage 4 chronic kidney disease, with long-term current use of insulin  (HCC) 07/02/2021   Arthritis of hip 04/18/2021   Background diabetic retinopathy (HCC) 04/18/2021   Chronic kidney disease, stage 3b (HCC) 04/18/2021   Dyslipidemia 04/18/2021   Family history of malignant neoplasm of digestive organs 04/18/2021   Gouty arthritis 04/18/2021   Hearing loss 04/18/2021   Hyperglycemia due to type 2 diabetes mellitus (HCC) 04/18/2021   Long term (current) use of insulin  (HCC) 04/18/2021   Obstructive sleep apnea (adult) (pediatric) 04/18/2021   Peripheral arterial disease (HCC) 04/18/2021   History of colonic polyps 04/18/2021   Flank pain 10/14/2019   Type 2 diabetes mellitus with diabetic polyneuropathy, with long-term current use of insulin  (HCC) 12/01/2018   Type 2 diabetes mellitus with retinopathy, with long-term current use of insulin  (HCC) 12/01/2018   Type 2 diabetes mellitus with stage 3b chronic kidney disease, with long-term current use of insulin  (HCC) 11/30/2018   Non-intractable vomiting    Acute hepatitis 07/13/2018   Acute on chronic renal insufficiency 07/12/2018   Diabetes mellitus (HCC) 12/08/2016   Essential hypertension 12/08/2016   Hyperlipidemia associated with type 2 diabetes mellitus (HCC) 12/08/2016    Current Outpatient Medications  Medication Sig Dispense Refill   ACETAMINOPHEN  PO Take 650 mg by mouth every 6 (six) hours as needed for headache or moderate pain.     albuterol  (VENTOLIN  HFA) 108 (90 Base) MCG/ACT inhaler Inhale 2 puffs into the lungs every 6 (six) hours as needed for wheezing or shortness of breath. 1 g 3   allopurinol  (ZYLOPRIM ) 100 MG tablet TAKE 1 TABLET BY MOUTH  EVERY DAY 90 tablet 3   amLODipine  (NORVASC ) 10 MG tablet TAKE 1 TABLET BY MOUTH EVERY DAY 90 tablet 1   Cholecalciferol (VITAMIN D ) 50 MCG (2000 UT) CAPS Take 4,000 Units by mouth daily.     Continuous Glucose Sensor (FREESTYLE LIBRE 3 SENSOR) MISC 1 Device by Does not apply route daily in the afternoon. Place 1 sensor on the skin every 14 days. Use to check glucose continuously 6 each 3   Docusate Sodium  (COLACE PO) Take 1 capsule by mouth daily.     ezetimibe  (ZETIA ) 10 MG tablet TAKE 1 TABLET BY MOUTH EVERY DAY 90 tablet 1   fluocinonide (LIDEX) 0.05 % external solution Apply topically in the morning and at bedtime.     insulin  aspart (NOVOLOG  FLEXPEN) 100 UNIT/ML FlexPen Max daily 45 units (Patient taking differently: 13 Units 3 (three) times daily with meals. Max daily 45 units (adds additional insulin ) 45 mL 3   insulin  glargine, 2 Unit Dial , (TOUJEO  MAX SOLOSTAR) 300 UNIT/ML Solostar Pen Inject 20 Units into the skin daily at 6 (six) AM. (Patient taking differently: Inject 18 Units into the skin daily at 6 (six) AM.) 15 mL 3   Insulin  Pen Needle (PEN NEEDLES) 32G X 4 MM MISC 1 Device by Does not apply route in the morning, at noon, in the evening, and at bedtime. 400 each 3   loratadine (CLARITIN) 10 MG tablet Take 10 mg by mouth daily as needed (seasonal allergies).     losartan  (COZAAR ) 50 MG tablet TAKE 1 TABLET BY MOUTH EVERY DAY 90 tablet  0   meclizine  (ANTIVERT ) 12.5 MG tablet Take 1 tablet (12.5 mg total) by mouth 3 (three) times daily as needed for dizziness. 30 tablet 0   Polyethyl Glycol-Propyl Glycol (SYSTANE FREE OP) Apply 1 drop to eye as needed.     REPATHA  SURECLICK 140 MG/ML SOAJ ADMINISTER 1 ML UNDER THE SKIN EVERY 14 DAYS 6 mL 3   Semaglutide , 2 MG/DOSE, 8 MG/3ML SOPN Inject 2 mg as directed once a week. 9 mL 3   vitamin B-12 (CYANOCOBALAMIN) 1000 MCG tablet Take 1,000 mcg by mouth daily.     No current facility-administered medications for this visit.    Allergies:  Lisinopril, Atorvastatin , Penicillins, Rosuvastatin , Sulfamethoxazole-trimethoprim, and Penicillin g  Past Medical History:  Diagnosis Date   CKD (chronic kidney disease)    Diabetes mellitus without complication (HCC)    Hyperlipidemia    Hypertension     Past Surgical History:  Procedure Laterality Date   BREAST BIOPSY     BREAST EXCISIONAL BIOPSY Left     Family History  Problem Relation Age of Onset   Skin cancer Mother    Heart attack Father    Colon cancer Sister     Social History   Tobacco Use   Smoking status: Never   Smokeless tobacco: Never  Substance Use Topics   Alcohol use: No    Subjective:   Recurrent issues with vaginal itching/ burning; requesting to rule out UTI; last week at nephrology WBC was elevated at 12.1;  Has noticed increased fatigue/ sense of shortness of breath with activity; these symptoms present for months.     Objective:  Vitals:   02/17/23 1116  BP: 138/86  Pulse: 94  Temp: 97.9 F (36.6 C)  TempSrc: Oral  SpO2: 98%  Weight: 181 lb 6.4 oz (82.3 kg)  Height: 5' 4 (1.626 m)    General: Well developed, well nourished, in no acute distress  Skin : Warm and dry.  Head: Normocephalic and atraumatic  Eyes: Sclera and conjunctiva clear; pupils round and reactive to light; extraocular movements intact  Ears: External normal; canals clear; tympanic membranes normal  Oropharynx: Pink, supple. No suspicious lesions  Neck: Supple without thyromegaly, adenopathy  Lungs: Respirations unlabored; clear to auscultation bilaterally without wheeze, rales, rhonchi  CVS exam: normal rate and regular rhythm.  Neurologic: Alert and oriented; speech intact; face symmetrical; moves all extremities well; CNII-XII intact without focal deficit   Assessment:  1. Dysuria   2. Atypical chest pain   3. DOE (dyspnea on exertion)     Plan:  Check U/A and urine culture; encouraged trial of Dove Sensitive skin; follow up to be determined; Update EKG-  shows sinus rhythm; will update CXR today;  Check BNP; will plan for cardiology referral assuming CXR is normal;   No follow-ups on file.  Orders Placed This Encounter  Procedures   Urine Culture   DG Chest 2 View    Standing Status:   Future    Expiration Date:   02/17/2024    Reason for Exam (SYMPTOM  OR DIAGNOSIS REQUIRED):   dyspnea on exertion    Preferred imaging location?:   [facility]   POCT Urinalysis Dipstick   EKG 12-Lead    Requested Prescriptions    No prescriptions requested or ordered in this encounter

## 2023-02-18 ENCOUNTER — Encounter: Payer: Self-pay | Admitting: Family

## 2023-02-18 LAB — URINE CULTURE
MICRO NUMBER:: 16038799
Result:: NO GROWTH
SPECIMEN QUALITY:: ADEQUATE

## 2023-02-19 ENCOUNTER — Ambulatory Visit: Payer: Medicare HMO | Admitting: Psychology

## 2023-02-19 DIAGNOSIS — F4323 Adjustment disorder with mixed anxiety and depressed mood: Secondary | ICD-10-CM

## 2023-02-19 NOTE — Progress Notes (Signed)
 Hayley Case Progress Note  Patient ID: Hayley Case, MRN: [id number],    Date: 02/19/2023  Time Spent: 11:00am-11:55am   55 minutes   Treatment Type: Individual Therapy  Reported Symptoms: stress  Mental Status Exam: Appearance:  Casual     Behavior: Appropriate  Motor: Normal  Speech/Language:  Normal Rate  Affect: Appropriate  Mood: normal  Thought process: normal  Thought content:   WNL  Sensory/Perceptual disturbances:   WNL  Orientation: oriented to person, place, time/date, and situation  Attention: Good  Concentration: Good  Memory: WNL  Fund of knowledge:  Good  Insight:   Good  Judgment:  Good  Impulse Control: Good   Risk Assessment: Danger to Self:  No Self-injurious Behavior: No Danger to Others: No Duty to Warn:no Physical Aggression / Violence:No  Access to Firearms a concern: No  Gang Involvement:No   Subjective: Pt present for face-to-face individual therapy via video.  Pt consents to telehealth video session and is aware of limitations and benefits of virtual sessions.   Location of pt: home Location of therapist: home office.   Pt talked about her health.   She was having chest pains and had to go to the doctor.  Pt was referred to her cardiologist.   Initial testing was ok and her PCP thinks her symptoms are stress related.   Pt talked about her son.  Pt has had to take her son to doctors appointments.   Pt states it can be overwhelming at times. Pt talked about her daughter Hayley Case coming to visit next week.   Pt is looking forward to spending time with her.  She will stay for a week.  Pt is planning activities for them to do together.   Pt talked about the son of her friend dying recently.   Pt was close to the family.   Helped pt process her feelings and grief.    Pt continues to work on not saying she is sorry a lot.   Addressed that pt feels self deprecating and it has become a habit to say she is sorry.  Pt  states she tends to over think things and assumes what people may be thinking.   She tends to make up negative narratives.   Worked on thought reframing.    Worked on self care strategies. Provided supportive therapy.  Interventions: Cognitive Behavioral Therapy and Insight-Oriented  Diagnosis:  F43.23  Plan of Care: Recommend ongoing therapy.  Pt participated in setting treatment goals.   Pt wants to improve coping skills.  Plan to meet monthly.  Pt agrees with treatment plan.    Treatment Plan  (treatment plan target date:  11/18/2023) Client Abilities/Strengths  Pt is bright, engaging, and motivated for therapy.  Client Treatment Preferences  Individual therapy.  Client Statement of Needs  Improve copings skills and understand herself better. Improve self esteem.  Symptoms  Depressed or irritable mood. Excessive and/or unrealistic worry that is difficult to control occurring more days than not for at least 6 months about a number of events or activities. Hypervigilance (e.g., feeling constantly on edge, experiencing concentration difficulties, having trouble falling or staying asleep, exhibiting a general state of irritability). Low self-esteem. Problems Addressed  Unipolar Depression, Anxiety Goals 1. Alleviate depressive symptoms and return to previous level of effective functioning. 2. Appropriately grieve the loss in order to normalize mood and to return to previously adaptive level of functioning. Objective Learn and implement behavioral strategies to overcome depression. Target Date:  2023-11-18 Frequency: Monthly  Progress: 50 Modality: individual  Related Interventions Assist the client in developing skills that increase the likelihood of deriving pleasure from behavioral activation (e.g., assertiveness skills, developing an exercise plan, less internal/more external focus, increased social involvement); reinforce success. Engage the client in behavioral activation,  increasing his/her activity level and contact with sources of reward, while identifying processes that inhibit activation. use behavioral techniques such as instruction, rehearsal, role-playing, role reversal, as needed, to facilitate activity in the client's daily life; reinforce success. 3. Develop healthy interpersonal relationships that lead to the alleviation and help prevent the relapse of depression. 4. Develop healthy thinking patterns and beliefs about self, others, and the world that lead to the alleviation and help prevent the relapse of depression. 5. Enhance ability to effectively cope with the full variety of life's worries and anxieties. 6. Learn and implement coping skills that result in a reduction of anxiety and worry, and improved daily functioning. Objective Learn and implement problem-solving strategies for realistically addressing worries. Target Date: 2023-11-18 Frequency: Monthly  Progress: 50 Modality: individual  Related Interventions Assign the client a homework exercise in which he/she problem-solves a current problem (see Mastery of Your Anxiety and Worry: Workbook by Richarda and Jonne or Generalized Anxiety Disorder by Delores Filler, and Jonne); review, reinforce success, and provide corrective feedback toward improvement. Teach the client problem-solving strategies involving specifically defining a problem, generating options for addressing it, evaluating the pros and cons of each option, selecting and implementing an optional action, and reevaluating and refining the action. Objective Learn and implement calming skills to reduce overall anxiety and manage anxiety symptoms. Target Date: 2023-11-18 Frequency: Monthly  Progress: 50 Modality: individual  Related Interventions Assign the client to read about progressive muscle relaxation and other calming strategies in relevant books or treatment manuals (e.g., Progressive Relaxation Training by Thornell armin Collier;  Mastery of Your Anxiety and Worry: Workbook by Richarda armin Jonne). Assign the client homework each session in which he/she practices relaxation exercises daily, gradually applying them progressively from non-anxiety-provoking to anxiety-provoking situations; review and reinforce success while providing corrective feedback toward improvement. Teach the client calming/relaxation skills (e.g., applied relaxation, progressive muscle relaxation, cue controlled relaxation; mindful breathing; biofeedback) and how to discriminate better between relaxation and tension; teach the client how to apply these skills to his/her daily life. 7. Recognize, accept, and cope with feelings of depression. 8. Reduce overall frequency, intensity, and duration of the anxiety so that daily functioning is not impaired. 9. Resolve the core conflict that is the source of anxiety. 10. Stabilize anxiety level while increasing ability to function on a daily basis. Diagnosis F43.23 Conditions For Discharge Achievement of treatment goals and objectives   Veva Alma, LCSW

## 2023-03-12 ENCOUNTER — Other Ambulatory Visit: Payer: Self-pay | Admitting: Family

## 2023-03-19 ENCOUNTER — Ambulatory Visit: Payer: Medicare HMO | Admitting: Psychology

## 2023-03-24 ENCOUNTER — Ambulatory Visit: Attending: Cardiovascular Disease | Admitting: Cardiovascular Disease

## 2023-03-24 ENCOUNTER — Encounter: Payer: Self-pay | Admitting: Cardiovascular Disease

## 2023-03-24 VITALS — BP 132/84 | HR 98 | Ht 64.0 in | Wt 178.4 lb

## 2023-03-24 DIAGNOSIS — I10 Essential (primary) hypertension: Secondary | ICD-10-CM | POA: Diagnosis not present

## 2023-03-24 DIAGNOSIS — E785 Hyperlipidemia, unspecified: Secondary | ICD-10-CM | POA: Diagnosis not present

## 2023-03-24 DIAGNOSIS — I6523 Occlusion and stenosis of bilateral carotid arteries: Secondary | ICD-10-CM

## 2023-03-24 DIAGNOSIS — I739 Peripheral vascular disease, unspecified: Secondary | ICD-10-CM | POA: Diagnosis not present

## 2023-03-24 NOTE — Progress Notes (Signed)
 Cardiology Office Note   Date:  03/24/2023   ID:  Hayley Case, DOB Jan 04, 1951, MRN [id number]  PCP:  Olive Bass, FNP  Cardiologist: Dr. Flora Lipps  No chief complaint on file.      History of Present Illness: Hayley Case is a 73 y.o. female who is here today for follow-up visit regarding peripheral arterial disease.   She has known history of diabetes mellitus, hyperlipidemia, hypertension, chronic kidney disease and carotid artery disease. Cardiac testing in 2020 was unremarkable including an echocardiogram and a Lexiscan Myoview.  She is followed for right calf claudication with suspected right SFA/popliteal disease or occlusion.  ABI was normal on the left side and moderately reduced on the right and 0.59.  She did not tolerate cilostazol due to GI symptoms.  She does have right calf claudication that happens after walking about 1 block.  She denies chest pain or shortness of breath.  She reports being under significant stress due to taking care of her son who is on dialysis.  She is statin intolerant but doing extremely well with Repatha.    Past Medical History:  Diagnosis Date   CKD (chronic kidney disease)    Diabetes mellitus without complication (HCC)    Hyperlipidemia    Hypertension     Past Surgical History:  Procedure Laterality Date   BREAST BIOPSY     BREAST EXCISIONAL BIOPSY Left      Current Outpatient Medications  Medication Sig Dispense Refill   ACETAMINOPHEN PO Take 650 mg by mouth every 6 (six) hours as needed for headache or moderate pain.     albuterol (VENTOLIN HFA) 108 (90 Base) MCG/ACT inhaler Inhale 2 puffs into the lungs every 6 (six) hours as needed for wheezing or shortness of breath. 1 g 3   allopurinol (ZYLOPRIM) 100 MG tablet TAKE 1 TABLET BY MOUTH EVERY DAY 90 tablet 3   amLODipine (NORVASC) 10 MG tablet TAKE 1 TABLET BY MOUTH EVERY DAY 90 tablet 1   Cholecalciferol (VITAMIN D) 50 MCG (2000 UT) CAPS Take 4,000 Units  by mouth daily.     Continuous Glucose Sensor (FREESTYLE LIBRE 3 SENSOR) MISC 1 Device by Does not apply route daily in the afternoon. Place 1 sensor on the skin every 14 days. Use to check glucose continuously 6 each 3   Docusate Sodium (COLACE PO) Take 1 capsule by mouth daily.     ezetimibe (ZETIA) 10 MG tablet TAKE 1 TABLET BY MOUTH EVERY DAY 90 tablet 1   fluocinonide (LIDEX) 0.05 % external solution Apply topically in the morning and at bedtime.     insulin aspart (NOVOLOG FLEXPEN) 100 UNIT/ML FlexPen Max daily 45 units (Patient taking differently: 13 Units 3 (three) times daily with meals. Max daily 45 units (adds additional insulin) 45 mL 3   insulin glargine, 2 Unit Dial, (TOUJEO MAX SOLOSTAR) 300 UNIT/ML Solostar Pen Inject 20 Units into the skin daily at 6 (six) AM. (Patient taking differently: Inject 18 Units into the skin daily at 6 (six) AM.) 15 mL 3   Insulin Pen Needle (PEN NEEDLES) 32G X 4 MM MISC 1 Device by Does not apply route in the morning, at noon, in the evening, and at bedtime. 400 each 3   loratadine (CLARITIN) 10 MG tablet Take 10 mg by mouth daily as needed (seasonal allergies).     losartan (COZAAR) 50 MG tablet TAKE 1 TABLET BY MOUTH EVERY DAY 90 tablet 0   meclizine (ANTIVERT) 12.5  MG tablet Take 1 tablet (12.5 mg total) by mouth 3 (three) times daily as needed for dizziness. 30 tablet 0   Polyethyl Glycol-Propyl Glycol (SYSTANE FREE OP) Apply 1 drop to eye as needed.     Polyvinyl Alcohol-Povidone (REFRESH OP) Apply to eye.     REPATHA SURECLICK 140 MG/ML SOAJ ADMINISTER 1 ML UNDER THE SKIN EVERY 14 DAYS 6 mL 3   Semaglutide, 2 MG/DOSE, 8 MG/3ML SOPN Inject 2 mg as directed once a week. 9 mL 3   vitamin B-12 (CYANOCOBALAMIN) 1000 MCG tablet Take 1,000 mcg by mouth daily.     No current facility-administered medications for this visit.    Allergies:   Lisinopril, Atorvastatin, Penicillins, Rosuvastatin, Sulfamethoxazole-trimethoprim, and Penicillin g    Social  History:  The patient  reports that she has never smoked. She has never used smokeless tobacco. She reports that she does not drink alcohol and does not use drugs.   Family History:  The patient's family history includes Colon cancer in her sister; Heart attack in her father; Skin cancer in her mother.    ROS:  Please see the history of present illness.   Otherwise, review of systems are positive for none.   All other systems are reviewed and negative.    PHYSICAL EXAM: VS:  BP 132/84   Pulse 98   Ht 5\' 4"  (1.626 m)   Wt 178 lb 6.4 oz (80.9 kg)   SpO2 99%   BMI 30.62 kg/m  , BMI Body mass index is 30.62 kg/m. GEN: Well nourished, well developed, in no acute distress  HEENT: normal  Neck: no JVD,  or masses.  Bilateral carotid bruits Cardiac: RRR; no rubs, or gallops,no edema .  2 out of 6 systolic murmur in the aortic area Respiratory:  clear to auscultation bilaterally, normal work of breathing GI: soft, nontender, nondistended, + BS MS: no deformity or atrophy  Skin: warm and dry, no rash Neuro:  Strength and sensation are intact Psych: euthymic mood, full affect    EKG:  EKG is  not ordered today.   Recent Labs: 10/07/2022: BUN 35; Creatinine, Ser 2.26; Potassium 4.2; Sodium 141 02/17/2023: Hemoglobin 12.3; Platelets 324.0; Pro B Natriuretic peptide (BNP) 13.0    Lipid Panel    Component Value Date/Time   CHOL 163 10/07/2022 0739   CHOL 114 05/06/2019 1222   TRIG 110.0 10/07/2022 0739   HDL 56.50 10/07/2022 0739   HDL 35 (L) 05/06/2019 1222   CHOLHDL 3 10/07/2022 0739   VLDL 22.0 10/07/2022 0739   LDLCALC 84 10/07/2022 0739   LDLCALC 54 05/06/2019 1222   LDLDIRECT 79.0 10/07/2022 0739      Wt Readings from Last 3 Encounters:  03/24/23 178 lb 6.4 oz (80.9 kg)  02/17/23 181 lb 6.4 oz (82.3 kg)  12/29/22 185 lb (83.9 kg)           No data to display            ASSESSMENT AND PLAN:  1.  Peripheral arterial disease with moderate to severe right calf  claudication .  Stable symptoms overall.  I discussed with her the importance of a structured exercise program and provided her with instructions. She does take care of her son who is on dialysis and has been under significant stress due to that, I agreed to give her handicap parking form given her limitations.  2.  Essential hypertension: Blood pressure is controlled on current medications.  3.  Hyperlipidemia: She did  not tolerate rosuvastatin due to myalgia.  She is doing very well with Repatha.  Recommended target LDL of less than 55.  4.  Chronic kidney disease: Stable overall.  5.  Carotid disease: Moderate left carotid stenosis.  Recommend repeat study in May.    Disposition:   FU with me in 12 months  Signed,  Lorine Bears, MD  03/24/2023 10:01 AM    [facility]

## 2023-03-24 NOTE — Patient Instructions (Signed)
 Medication Instructions:  No changes *If you need a refill on your cardiac medications before your next appointment, please call your pharmacy*   Lab Work: None ordered If you have labs (blood work) drawn today and your tests are completely normal, you will receive your results only by: MyChart Message (if you have MyChart) OR A paper copy in the mail If you have any lab test that is abnormal or we need to change your treatment, we will call you to review the results.   Testing/Procedures: None ordered   Follow-Up: At [facility], you and your health needs are our priority.  As part of our continuing mission to provide you with exceptional heart care, we have created designated Provider Care Teams.  These Care Teams include your primary Cardiologist (physician) and Advanced Practice Providers (APPs -  Physician Assistants and Nurse Practitioners) who all work together to provide you with the care you need, when you need it.  We recommend signing up for the patient portal called "MyChart".  Sign up information is provided on this After Visit Summary.  MyChart is used to connect with patients for Virtual Visits (Telemedicine).  Patients are able to view lab/test results, encounter notes, upcoming appointments, etc.  Non-urgent messages can be sent to your provider as well.   To learn more about what you can do with MyChart, go to [email or web address].    Your next appointment:   12 month(s)  Provider:   Reatha Harps, MD     Other Instructions EXERCISE PROGRAM FOR INDIVIDUALS WITH  PERIPHERAL ARTERIAL DISEASE (PAD)   General Information:   Research in vascular exercise has demonstrated remarkable improvement in symptoms of leg pain (claudication) without expensive or invasive interventions. Regular walking programs are extremely helpful for patients with PAD and intermittent claudication.  These steps are designed to help you get started with a safe and effective  program to help you walk farther with less pain:   Walk at least three times a week (preferably every day).  Your goal is to build up to 30-45 minutes of total walking time (not counting rest breaks). It may take you several weeks to build up your exercise time starting at 5-10 minutes or whatever you can tolerate.  Walk as far as possible using moderate to maximal pain (7-8 on the scale below) as a signal to stop, and resume walking when the pain goes away.  On a treadmill, set the speed and grade at a level that brings on the claudication pain within 3 to 5 minutes. Walk at this rate until you experience claudication of moderate severity, rest until the pain improves, and then resume walking.  Over time, you will be able to walk longer at the designated speed and grade; workload should then be increased until you develop the pain within 3 to 5 minutes once again.  This regimen will induce a significant benefit. Studies have demonstrated that participants may be able to walk up to three or four times farther and have less leg pain, within twelve weeks, by following this protocol.  Pain Scale    0_____1_____2_____3_____4_____5_____6_____7_____8_____9_____10   No Pain                                   Moderate Pain  Maximal Pain

## 2023-04-14 ENCOUNTER — Other Ambulatory Visit: Payer: Self-pay | Admitting: Family Medicine

## 2023-04-14 DIAGNOSIS — I10 Essential (primary) hypertension: Secondary | ICD-10-CM

## 2023-04-16 ENCOUNTER — Ambulatory Visit: Payer: Medicare HMO | Admitting: Psychology

## 2023-04-16 DIAGNOSIS — F4323 Adjustment disorder with mixed anxiety and depressed mood: Secondary | ICD-10-CM | POA: Diagnosis not present

## 2023-04-16 NOTE — Progress Notes (Signed)
 [facility] Counselor/Therapist Progress Note  Patient ID: Hayley Case, MRN: [id number],    Date: 04/16/2023  Time Spent: 11:00am-11:55am   55 minutes   Treatment Type: Individual Therapy  Reported Symptoms: stress  Mental Status Exam: Appearance:  Casual     Behavior: Appropriate  Motor: Normal  Speech/Language:  Normal Rate  Affect: Appropriate  Mood: normal  Thought process: normal  Thought content:   WNL  Sensory/Perceptual disturbances:   WNL  Orientation: oriented to person, place, time/date, and situation  Attention: Good  Concentration: Good  Memory: WNL  Fund of knowledge:  Good  Insight:   Good  Judgment:  Good  Impulse Control: Good   Risk Assessment: Danger to Self:  No Self-injurious Behavior: No Danger to Others: No Duty to Warn:no Physical Aggression / Violence:No  Access to Firearms a concern: No  Gang Involvement:No   Subjective: Pt present for face-to-face individual therapy via video.  Pt consents to telehealth video session and is aware of limitations and benefits of virtual sessions.   Location of pt: home Location of therapist: home office.   Pt talked about her son who lives with her.  He broke his toe and pt is having to help with wound care.  This has been stressful for pt.  Pt also worries about her son a lot.  Pt talked about having a couple of deaths in her family.   Addressed the losses and how they impacted pt.  Helped pt process her feelings and grief.   Pt has worried that her sense of humor is not appreciated by some people.  Addressed how pt can tend to be too hard on herself.   Worked on thought reframing.    Worked on self care strategies. Provided supportive therapy.  Interventions: Cognitive Behavioral Therapy and Insight-Oriented  Diagnosis:  F43.23  Plan of Care: Recommend ongoing therapy.  Pt participated in setting treatment goals.   Pt wants to improve coping skills.  Plan to meet monthly.  Pt agrees  with treatment plan.    Treatment Plan  (treatment plan target date:  11/18/2023) Client Abilities/Strengths  Pt is bright, engaging, and motivated for therapy.  Client Treatment Preferences  Individual therapy.  Client Statement of Needs  Improve copings skills and understand herself better. Improve self esteem.  Symptoms  Depressed or irritable mood. Excessive and/or unrealistic worry that is difficult to control occurring more days than not for at least 6 months about a number of events or activities. Hypervigilance (e.g., feeling constantly on edge, experiencing concentration difficulties, having trouble falling or staying asleep, exhibiting a general state of irritability). Low self-esteem. Problems Addressed  Unipolar Depression, Anxiety Goals 1. Alleviate depressive symptoms and return to previous level of effective functioning. 2. Appropriately grieve the loss in order to normalize mood and to return to previously adaptive level of functioning. Objective Learn and implement behavioral strategies to overcome depression. Target Date: 2023-11-18 Frequency: Monthly  Progress: 50 Modality: individual  Related Interventions Assist the client in developing skills that increase the likelihood of deriving pleasure from behavioral activation (e.g., assertiveness skills, developing an exercise plan, less internal/more external focus, increased social involvement); reinforce success. Engage the client in "behavioral activation," increasing his/her activity level and contact with sources of reward, while identifying processes that inhibit activation. use behavioral techniques such as instruction, rehearsal, role-playing, role reversal, as needed, to facilitate activity in the client's daily life; reinforce success. 3. Develop healthy interpersonal relationships that lead to the alleviation and help  prevent the relapse of depression. 4. Develop healthy thinking patterns and beliefs about self,  others, and the world that lead to the alleviation and help prevent the relapse of depression. 5. Enhance ability to effectively cope with the full variety of life's worries and anxieties. 6. Learn and implement coping skills that result in a reduction of anxiety and worry, and improved daily functioning. Objective Learn and implement problem-solving strategies for realistically addressing worries. Target Date: 2023-11-18 Frequency: Monthly  Progress: 50 Modality: individual  Related Interventions Assign the client a homework exercise in which he/she problem-solves a current problem (see Mastery of Your Anxiety and Worry: Workbook by Elenora Fender and Filbert Schilder or Generalized Anxiety Disorder by Elesa Hacker, and Filbert Schilder); review, reinforce success, and provide corrective feedback toward improvement. Teach the client problem-solving strategies involving specifically defining a problem, generating options for addressing it, evaluating the pros and cons of each option, selecting and implementing an optional action, and reevaluating and refining the action. Objective Learn and implement calming skills to reduce overall anxiety and manage anxiety symptoms. Target Date: 2023-11-18 Frequency: Monthly  Progress: 50 Modality: individual  Related Interventions Assign the client to read about progressive muscle relaxation and other calming strategies in relevant books or treatment manuals (e.g., Progressive Relaxation Training by Twana First; Mastery of Your Anxiety and Worry: Workbook by Earlie Counts). Assign the client homework each session in which he/she practices relaxation exercises daily, gradually applying them progressively from non-anxiety-provoking to anxiety-provoking situations; review and reinforce success while providing corrective feedback toward improvement. Teach the client calming/relaxation skills (e.g., applied relaxation, progressive muscle relaxation, cue controlled relaxation;  mindful breathing; biofeedback) and how to discriminate better between relaxation and tension; teach the client how to apply these skills to his/her daily life. 7. Recognize, accept, and cope with feelings of depression. 8. Reduce overall frequency, intensity, and duration of the anxiety so that daily functioning is not impaired. 9. Resolve the core conflict that is the source of anxiety. 10. Stabilize anxiety level while increasing ability to function on a daily basis. Diagnosis F43.23 Conditions For Discharge Achievement of treatment goals and objectives   Salomon Fick, LCSW

## 2023-04-17 ENCOUNTER — Encounter: Payer: Self-pay | Admitting: Nurse Practitioner

## 2023-04-17 ENCOUNTER — Ambulatory Visit: Attending: Nurse Practitioner | Admitting: Nurse Practitioner

## 2023-04-17 VITALS — BP 130/72 | HR 88 | Ht 64.0 in | Wt 177.0 lb

## 2023-04-17 DIAGNOSIS — I6523 Occlusion and stenosis of bilateral carotid arteries: Secondary | ICD-10-CM | POA: Diagnosis not present

## 2023-04-17 DIAGNOSIS — N184 Chronic kidney disease, stage 4 (severe): Secondary | ICD-10-CM

## 2023-04-17 DIAGNOSIS — R0602 Shortness of breath: Secondary | ICD-10-CM | POA: Diagnosis not present

## 2023-04-17 DIAGNOSIS — I10 Essential (primary) hypertension: Secondary | ICD-10-CM | POA: Diagnosis not present

## 2023-04-17 DIAGNOSIS — E1122 Type 2 diabetes mellitus with diabetic chronic kidney disease: Secondary | ICD-10-CM

## 2023-04-17 DIAGNOSIS — I739 Peripheral vascular disease, unspecified: Secondary | ICD-10-CM | POA: Diagnosis not present

## 2023-04-17 DIAGNOSIS — E785 Hyperlipidemia, unspecified: Secondary | ICD-10-CM

## 2023-04-17 DIAGNOSIS — Z794 Long term (current) use of insulin: Secondary | ICD-10-CM

## 2023-04-17 NOTE — Progress Notes (Signed)
 Office Visit    Patient Name: Hayley Case Date of Encounter: 04/17/2023  Primary Care Provider:  Olive Bass, FNP Primary Cardiologist:  Reatha Harps, MD  Chief Complaint    73 year old female with a history of bilateral carotid artery stenosis, PAD, hypertension, hyperlipidemia, type 2 diabetes, and CKD stage IV who presents for follow-up related to shortness of breath.  Past Medical History    Past Medical History:  Diagnosis Date   CKD (chronic kidney disease)    Diabetes mellitus without complication (HCC)    Hyperlipidemia    Hypertension    Past Surgical History:  Procedure Laterality Date   BREAST BIOPSY     BREAST EXCISIONAL BIOPSY Left     Allergies  Allergies  Allergen Reactions   Lisinopril Cough   Atorvastatin Other (See Comments)    Liver function test elevation Other reaction(s): liver issues   Penicillins Hives and Itching    Did it involve swelling of the face/tongue/throat, SOB, or low BP? No Did it involve sudden or severe rash/hives, skin peeling, or any reaction on the inside of your mouth or nose? Yes Did you need to seek medical attention at a hospital or doctor's office? Yes When did it last happen?      2012 If all above answers are "NO", may proceed with cephalosporin use.   Rosuvastatin Other (See Comments)    Myalgias / leg cramps   Sulfamethoxazole-Trimethoprim Hives    Other reaction(s): Unknown   Penicillin G     Other reaction(s): Unknown     Labs/Other Studies Reviewed    The following studies were reviewed today:  Cardiac Studies & Procedures   ______________________________________________________________________________________________   STRESS TESTS  MYOCARDIAL PERFUSION IMAGING 09/24/2018  Narrative  The left ventricular ejection fraction is normal (55-65%).  Nuclear stress EF: 61%.  There was no ST segment deviation noted during stress.  The study is normal.  This is a low risk  study.  Normal stress nuclear study with no ischemia or infarction.  Gated ejection fraction 61% with normal wall motion.   ECHOCARDIOGRAM  ECHOCARDIOGRAM COMPLETE 08/17/2018  Narrative ECHOCARDIOGRAM REPORT    Patient Name:   Hayley Case Date of Exam: 08/17/2018 Medical Rec #:  [id number]       Height:       64.0 in Accession #:    [id number]      Weight:       215.6 lb Date of Birth:  1950-04-06       BSA:          2.02 m Patient Age:    68 years        BP:           143/81 mmHg Patient Gender: F               HR:           106 bpm. Exam Location:  Inpatient   Procedure: 2D Echo, Cardiac Doppler and Color Doppler  Indications:    Dyspnea 786.09 / R06.00  History:        Patient has no prior history of Echocardiogram examinations. Hypertension, Disbetes, Hyperlipidemia, Diabetes II, Hepatitis.  Sonographer:    Belva Chimes RDCS (AE) Referring Phys: [id number] Shea Stakes FLEMING  IMPRESSIONS   1. The left ventricle has normal systolic function with an ejection fraction of 60-65%. The cavity size was normal. There is mild asymmetric left ventricular hypertrophy. Left ventricular diastolic Doppler parameters are consistent  with impaired relaxation. 2. No evidence of mitral valve stenosis. 3. No stenosis of the aortic valve. 4. The aorta is normal in size and structure. 5. Grossly normal.  FINDINGS Left Ventricle: The left ventricle has normal systolic function, with an ejection fraction of 60-65%. The cavity size was normal. There is mild asymmetric left ventricular hypertrophy. Left ventricular diastolic Doppler parameters are consistent with impaired relaxation.  Right Ventricle: The right ventricle was not well visualized. The cavity was not assessed. There is right vetricular wall thickness was not assessed.  Left Atrium: Left atrial size was normal in size.  Right Atrium: Right atrial size was normal in size. Right atrial pressure is estimated at 10  mmHg.  Interatrial Septum: Grossly normal.  Pericardium: There is no evidence of pericardial effusion.  Mitral Valve: The mitral valve is normal in structure. Mitral valve regurgitation is not visualized by color flow Doppler. No evidence of mitral valve stenosis.  Tricuspid Valve: The tricuspid valve is normal in structure. Tricuspid valve regurgitation was not visualized by color flow Doppler.  Aortic Valve: The aortic valve is normal in structure. Aortic valve regurgitation was not visualized by color flow Doppler. There is No stenosis of the aortic valve, with a calculated valve area of 2.19 cm.  Pulmonic Valve: The pulmonic valve was grossly normal. Pulmonic valve regurgitation is not visualized by color flow Doppler.  Aorta: The aorta is normal in size and structure.   +--------------+--------++ LEFT VENTRICLE              +----------------+---------++ +--------------+--------++      Diastology                PLAX 2D                     +----------------+---------++ +--------------+--------++      LV e' lateral:  3.81 cm/s LVIDd:        3.15 cm       +----------------+---------++ +--------------+--------++      LV E/e' lateral:17.8      LVIDs:        2.40 cm       +----------------+---------++ +--------------+--------++      LV e' medial:   3.81 cm/s LV PW:        1.05 cm       +----------------+---------++ +--------------+--------++      LV E/e' medial: 17.8      LV IVS:       1.10 cm       +----------------+---------++ +--------------+--------++ LVOT diam:    2.00 cm  +--------------+--------++ LV SV:        19 ml    +--------------+--------++ LV SV Index:  8.97     +--------------+--------++ LVOT Area:    3.14 cm +--------------+--------++                        +--------------+--------++  +------------------+---------++ LV Volumes (MOD)            +------------------+---------++ LV area d,  A2C:   28.10 cm +------------------+---------++ LV area d, A4C:   28.40 cm +------------------+---------++ LV area s, A2C:   14.10 cm +------------------+---------++ LV area s, A4C:   16.10 cm +------------------+---------++ LV major d, A2C:  7.65 cm   +------------------+---------++ LV major d, A4C:  7.51 cm   +------------------+---------++ LV major s, A2C:  6.43 cm   +------------------+---------++ LV major s, A4C:  6.52 cm   +------------------+---------++ LV vol d, MOD A2C:85.7 ml   +------------------+---------++  LV vol d, MOD A4C:89.4 ml   +------------------+---------++ LV vol s, MOD A2C:27.9 ml   +------------------+---------++ LV vol s, MOD A4C:37.3 ml   +------------------+---------++ LV SV MOD A2C:    57.8 ml   +------------------+---------++ LV SV MOD A4C:    89.4 ml   +------------------+---------++ LV SV MOD BP:     55.7 ml   +------------------+---------++  +---------------+----------++ RIGHT VENTRICLE           +---------------+----------++ RV Basal diam: 2.60 cm    +---------------+----------++ RV Mid diam:   1.90 cm    +---------------+----------++ RV S prime:    12.80 cm/s +---------------+----------++ TAPSE (M-mode):2.6 cm     +---------------+----------++  +---------------+-------++-----------++ LEFT ATRIUM           Index       +---------------+-------++-----------++ LA diam:       3.95 cm1.96 cm/m  +---------------+-------++-----------++ LA Vol (A2C):  50.3 ml24.90 ml/m +---------------+-------++-----------++ LA Vol (A4C):  36.9 ml18.27 ml/m +---------------+-------++-----------++ LA Biplane Vol:45.2 ml22.38 ml/m +---------------+-------++-----------++ +------------+--------++----------++ RIGHT ATRIUM        Index      +------------+--------++----------++ RA Area:    9.00 cm            +------------+--------++----------++ RA Volume:  16.20 ml8.02 ml/m +------------+--------++----------++ +------------------+-----------++ +--------------+--------++ AORTIC VALVE                  PULMONIC VALVE         +------------------+-----------++ +--------------+--------++ AV Area (Vmax):   2.41 cm    PV Vmax:      0.87 m/s +------------------+-----------++ +--------------+--------++ AV Area (Vmean):  2.24 cm    PV Peak grad: 3.0 mmHg +------------------+-----------++ +--------------+--------++ AV Area (VTI):    2.19 cm    +------------------+-----------++ AV Vmax:          120.00 cm/s +------------------+-----------++ AV Vmean:         88.800 cm/s +------------------+-----------++ AV VTI:           0.237 m     +------------------+-----------++ AV Peak Grad:     5.8 mmHg    +------------------+-----------++ AV Mean Grad:     3.0 mmHg    +------------------+-----------++ LVOT Vmax:        92.00 cm/s  +------------------+-----------++ LVOT Vmean:       63.200 cm/s +------------------+-----------++ LVOT VTI:         0.165 m     +------------------+-----------++ LVOT/AV VTI ratio:0.70        +------------------+-----------++  +-------------+-------++ AORTA                +-------------+-------++ Ao Root diam:2.70 cm +-------------+-------++ Ao Asc diam: 3.20 cm +-------------+-------++  +--------------+----------++ MITRAL VALVE              +--------------+-------+ +--------------+----------++  SHUNTS                MV Area (PHT):2.42 cm    +--------------+-------+ +--------------+----------++  Systemic VTI: 0.16 m  MV PHT:       91.06 msec  +--------------+-------+ +--------------+----------++  Systemic Diam:2.00 cm MV Decel Time:314 msec    +--------------+-------+ +--------------+----------++ +--------------+-----------++ MV E velocity:67.70 cm/s   +--------------+-----------++ MV A velocity:110.00 cm/s +--------------+-----------++ MV E/A ratio: 0.62        +--------------+-----------++   Kristeen Miss MD Electronically signed by Kristeen Miss MD Signature Date/Time: 08/17/2018/5:31:35 PM    Final          ______________________________________________________________________________________________     Recent Labs: 10/07/2022: BUN 35; Creatinine, Ser 2.26; Potassium 4.2; Sodium 141 02/17/2023:  Hemoglobin 12.3; Platelets 324.0; Pro B Natriuretic peptide (BNP) 13.0  Recent Lipid Panel    Component Value Date/Time   CHOL 163 10/07/2022 0739   CHOL 114 05/06/2019 1222   TRIG 110.0 10/07/2022 0739   HDL 56.50 10/07/2022 0739   HDL 35 (L) 05/06/2019 1222   CHOLHDL 3 10/07/2022 0739   VLDL 22.0 10/07/2022 0739   LDLCALC 84 10/07/2022 0739   LDLCALC 54 05/06/2019 1222   LDLDIRECT 79.0 10/07/2022 0739    History of Present Illness    73 year old female with the above past medical history including bilateral carotid artery stenosis, PAD, hypertension, hyperlipidemia, type 2 diabetes, and CKD stage IV.  Echocardiogram in 2020 showed EF 60 to 65%, normal LV function, mild asymmetric LVH, no significant valvular disease.  Lexiscan Myoview in 2020 was low risk, no evidence of ischemia or infarction, EF 55 to 65%.   Carotid ultrasound in 05/2022 revealed 1 to 39% B ICA stenosis. She has a history of PAD with right calf claudication with suspected right SFA/popliteal disease or occlusion. ABI was normal on the left side and moderately reduced on the right and 0.59.  She did not tolerate cilostazol due to GI symptoms. She follows with Dr. Kirke Corin.  She was last seen in the office by Dr. Val EagleJennette Kettle on 8/6/204 and was doing well from a cardiac standpoint.  She does have a history of statin intolerance, she is on Repatha.  She saw Dr. Kirke Corin on 03/24/2023 and was doing well.  She denied worsening claudication and was encouraged to  increase activity as tolerated.   She presents today for follow-up. Since her last visit she has been stable overall from a cardiac standpoint.  She reports increased shortness of breath with activity she attributes to overall decreased activity. She denies chest pain.  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  She reports stable mild intermittent dizziness with position changes.  She denies palpitations, presyncope, edema, PND, orthopnea, weight gain.  BP is well-controlled.  Home Medications    Current Outpatient Medications  Medication Sig Dispense Refill   ACETAMINOPHEN PO Take 650 mg by mouth every 6 (six) hours as needed for headache or moderate pain.     albuterol (VENTOLIN HFA) 108 (90 Base) MCG/ACT inhaler Inhale 2 puffs into the lungs every 6 (six) hours as needed for wheezing or shortness of breath. 1 g 3   allopurinol (ZYLOPRIM) 100 MG tablet TAKE 1 TABLET BY MOUTH EVERY DAY 90 tablet 3   amLODipine (NORVASC) 10 MG tablet Take 1 tablet (10 mg total) by mouth daily. 90 tablet 1   Cholecalciferol (VITAMIN D) 50 MCG (2000 UT) CAPS Take 4,000 Units by mouth daily.     Continuous Glucose Sensor (FREESTYLE LIBRE 3 SENSOR) MISC 1 Device by Does not apply route daily in the afternoon. Place 1 sensor on the skin every 14 days. Use to check glucose continuously 6 each 3   Docusate Sodium (COLACE PO) Take 1 capsule by mouth daily.     ezetimibe (ZETIA) 10 MG tablet TAKE 1 TABLET BY MOUTH EVERY DAY 90 tablet 1   insulin aspart (NOVOLOG FLEXPEN) 100 UNIT/ML FlexPen Max daily 45 units (Patient taking differently: 13 Units 3 (three) times daily with meals. Max daily 45 units (adds additional insulin) 45 mL 3   insulin glargine, 2 Unit Dial, (TOUJEO MAX SOLOSTAR) 300 UNIT/ML Solostar Pen Inject 20 Units into the skin daily at 6 (  six) AM. (Patient taking differently: Inject 18 Units into the skin daily at 6 (six) AM.) 15 mL 3   Insulin Pen Needle  (PEN NEEDLES) 32G X 4 MM MISC 1 Device by Does not apply route in the morning, at noon, in the evening, and at bedtime. 400 each 3   loratadine (CLARITIN) 10 MG tablet Take 10 mg by mouth daily as needed (seasonal allergies).     losartan (COZAAR) 50 MG tablet TAKE 1 TABLET BY MOUTH EVERY DAY 90 tablet 0   meclizine (ANTIVERT) 12.5 MG tablet Take 1 tablet (12.5 mg total) by mouth 3 (three) times daily as needed for dizziness. 30 tablet 0   Polyethyl Glycol-Propyl Glycol (SYSTANE FREE OP) Apply 1 drop to eye as needed.     Polyvinyl Alcohol-Povidone (REFRESH OP) Apply to eye.     REPATHA SURECLICK 140 MG/ML SOAJ ADMINISTER 1 ML UNDER THE SKIN EVERY 14 DAYS 6 mL 3   Semaglutide, 2 MG/DOSE, 8 MG/3ML SOPN Inject 2 mg as directed once a week. 9 mL 3   vitamin B-12 (CYANOCOBALAMIN) 1000 MCG tablet Take 1,000 mcg by mouth daily.     No current facility-administered medications for this visit.     Review of Systems    She denies chest pain, palpitations, dyspnea, pnd, orthopnea, n, v, dizziness, syncope, edema, weight gain, or early satiety. All other systems reviewed and are otherwise negative except as noted above.   Physical Exam    VS:  BP 130/72 (BP Location: Left Arm, Patient Position: Sitting, Cuff Size: Normal)   Pulse 88   Ht 5\' 4"  (1.626 m)   Wt 177 lb (80.3 kg)   BMI 30.38 kg/m   GEN: Well nourished, well developed, in no acute distress. HEENT: normal. Neck: Supple, no JVD, carotid bruits, or masses. Cardiac: RRR, no murmurs, rubs, or gallops. No clubbing, cyanosis, edema.  Radials/DP/PT 2+ and equal bilaterally.  Respiratory:  Respirations regular and unlabored, clear to auscultation bilaterally. GI: Soft, nontender, nondistended, BS + x 4. MS: no deformity or atrophy. Skin: warm and dry, no rash. Neuro:  Strength and sensation are intact. Psych: Normal affect.  Accessory Clinical Findings    ECG personally reviewed by me today -    - no EKG in office today.   Lab Results   Component Value Date   WBC 7.7 02/17/2023   HGB 12.3 02/17/2023   HCT 36.1 02/17/2023   MCV 88.0 02/17/2023   PLT 324.0 02/17/2023   Lab Results  Component Value Date   CREATININE 2.26 (H) 10/07/2022   BUN 35 (H) 10/07/2022   NA 141 10/07/2022   K 4.2 10/07/2022   CL 109 10/07/2022   CO2 24 10/07/2022   Lab Results  Component Value Date   ALT 13 09/18/2020   AST 13 09/18/2020   GGT 145 (H) 05/18/2018   ALKPHOS 105 09/18/2020   BILITOT 0.6 09/18/2020   Lab Results  Component Value Date   CHOL 163 10/07/2022   HDL 56.50 10/07/2022   LDLCALC 84 10/07/2022   LDLDIRECT 79.0 10/07/2022   TRIG 110.0 10/07/2022   CHOLHDL 3 10/07/2022    Lab Results  Component Value Date   HGBA1C 7.3 (A) 12/29/2022    Assessment & Plan    1. Dyspnea on exertion: She reports increased shortness of breath with activity she attributes to overall decreased activity.  Denies chest pain. Will check echocardiogram.  Continue to monitor symptoms.  2. Hypertension: BP well controlled. Continue current antihypertensive  regimen.   3. Hyperlipidemia: LDL was 84 09/2022.  Will repeat fasting lipids, CMET.  Continue Repatha, Zetia.  4. Carotid artery stenosis: Carotid ultrasound in 05/2022 revealed 1 to 39% B ICA stenosis.  6 mild dizziness with position changes, overall asymptomatic.  Continue Repatha, Zetia.  5. PAD: History of PAD with right calf claudication with suspected right SFA/popliteal disease or occlusion. ABI was normal on the left side and moderately reduced on the right and 0.59.  She did not tolerate cilostazol due to GI symptoms. Follows with Dr. Kirke Corin.  Continue Repatha, Zetia.  6. Type 2 diabetes: A1c was 7.3 in 12/2022.  Follows with endocrinology.   7. CKD stage IV: Creatinine was 2.36 in 01/2023.  Follows with nephrology.   8. Disposition: Follow-up in 6 months, sooner if needed.          Joylene Grapes, NP 04/17/2023, 3:49 PM

## 2023-04-17 NOTE — Patient Instructions (Addendum)
 Medication Instructions:  NO CHANGES  Lab Work: FASTING LIPID PANEL AND CMET TO BE DONE WHEN FASTING   Testing/Procedures: Your physician has requested that you have an echocardiogram. Echocardiography is a painless test that uses sound waves to create images of your heart. It provides your doctor with information about the size and shape of your heart and how well your heart's chambers and valves are working. This procedure takes approximately one hour. There are no restrictions for this procedure. Please do NOT wear cologne, perfume, aftershave, or lotions (deodorant is allowed). Please arrive 15 minutes prior to your appointment time.  Please note: We ask at that you not bring children with you during ultrasound (echo/ vascular) testing. Due to room size and safety concerns, children are not allowed in the ultrasound rooms during exams. Our front office staff cannot provide observation of children in our lobby area while testing is being conducted. An adult accompanying a patient to their appointment will only be allowed in the ultrasound room at the discretion of the ultrasound technician under special circumstances. We apologize for any inconvenience.   Follow-Up: At [name], you and your health needs are our priority.  As part of our continuing mission to provide you with exceptional heart care, our providers are all part of one team.  This team includes your primary Cardiologist (physician) and Advanced Practice Providers or APPs (Physician Assistants and Nurse Practitioners) who all work together to provide you with the care you need, when you need it.  Your next appointment:   6 month(s)  Provider:   Reatha Harps, MD      Other Instructions:      1st Floor: - Lobby - Registration  - Pharmacy  - Lab - Cafe  2nd Floor: - PV Lab - Diagnostic Testing (echo, CT, nuclear med)  3rd Floor: - Vacant  4th Floor: - TCTS (cardiothoracic surgery) - AFib  Clinic - [facility] - Vascular Surgery  - Vascular Ultrasound  5th Floor: - [facility] (general and EP) - Clinical Pharmacy for coumadin, hypertension, lipid, weight-loss medications, and med management appointments    Valet parking services will be available as well.

## 2023-04-19 ENCOUNTER — Encounter: Payer: Self-pay | Admitting: Nurse Practitioner

## 2023-04-20 ENCOUNTER — Other Ambulatory Visit: Payer: Self-pay | Admitting: Nurse Practitioner

## 2023-04-20 DIAGNOSIS — E785 Hyperlipidemia, unspecified: Secondary | ICD-10-CM

## 2023-04-20 DIAGNOSIS — I6523 Occlusion and stenosis of bilateral carotid arteries: Secondary | ICD-10-CM

## 2023-04-20 DIAGNOSIS — E1122 Type 2 diabetes mellitus with diabetic chronic kidney disease: Secondary | ICD-10-CM

## 2023-04-20 DIAGNOSIS — I10 Essential (primary) hypertension: Secondary | ICD-10-CM

## 2023-04-20 DIAGNOSIS — R0602 Shortness of breath: Secondary | ICD-10-CM

## 2023-04-20 DIAGNOSIS — I739 Peripheral vascular disease, unspecified: Secondary | ICD-10-CM

## 2023-04-27 ENCOUNTER — Telehealth: Payer: Self-pay

## 2023-04-27 NOTE — Telephone Encounter (Signed)
 Copied from CRM [id number]. Topic: Clinical - Request for Lab/Test Order >> Apr 27, 2023  4:41 PM Taleah C wrote: Reason for CRM: patient called to ask if Rice Chamorro could authorize the lab orders that were placed from her [facility] doctor for her to complete them at the lab in the office. Please call and advise.

## 2023-05-01 LAB — COMPREHENSIVE METABOLIC PANEL WITH GFR
ALT: 10 IU/L (ref 0–32)
AST: 13 IU/L (ref 0–40)
Albumin: 4.1 g/dL (ref 3.8–4.8)
Alkaline Phosphatase: 120 IU/L (ref 44–121)
BUN/Creatinine Ratio: 14 (ref 12–28)
BUN: 28 mg/dL — ABNORMAL HIGH (ref 8–27)
Bilirubin Total: 0.5 mg/dL (ref 0.0–1.2)
CO2: 21 mmol/L (ref 20–29)
Calcium: 9.7 mg/dL (ref 8.7–10.3)
Chloride: 107 mmol/L — ABNORMAL HIGH (ref 96–106)
Creatinine, Ser: 2.05 mg/dL — ABNORMAL HIGH (ref 0.57–1.00)
Globulin, Total: 2.7 g/dL (ref 1.5–4.5)
Glucose: 126 mg/dL — ABNORMAL HIGH (ref 70–99)
Potassium: 4.4 mmol/L (ref 3.5–5.2)
Sodium: 143 mmol/L (ref 134–144)
Total Protein: 6.8 g/dL (ref 6.0–8.5)
eGFR: 25 mL/min/{1.73_m2} — ABNORMAL LOW (ref >59)

## 2023-05-01 LAB — LIPID PANEL
Chol/HDL Ratio: 2.8 ratio (ref 0.0–4.4)
Cholesterol, Total: 174 mg/dL (ref 100–199)
HDL: 63 mg/dL (ref >39)
LDL Chol Calc (NIH): 90 mg/dL (ref 0–99)
Triglycerides: 117 mg/dL (ref 0–149)
VLDL Cholesterol Cal: 21 mg/dL (ref 5–40)

## 2023-05-05 ENCOUNTER — Ambulatory Visit (INDEPENDENT_AMBULATORY_CARE_PROVIDER_SITE_OTHER): Payer: Medicare HMO | Admitting: Internal Medicine

## 2023-05-05 ENCOUNTER — Encounter: Payer: Self-pay | Admitting: Internal Medicine

## 2023-05-05 VITALS — BP 122/80 | HR 100 | Ht 64.0 in | Wt 177.6 lb

## 2023-05-05 DIAGNOSIS — E1142 Type 2 diabetes mellitus with diabetic polyneuropathy: Secondary | ICD-10-CM

## 2023-05-05 DIAGNOSIS — N184 Chronic kidney disease, stage 4 (severe): Secondary | ICD-10-CM | POA: Diagnosis not present

## 2023-05-05 DIAGNOSIS — Z794 Long term (current) use of insulin: Secondary | ICD-10-CM

## 2023-05-05 DIAGNOSIS — E1122 Type 2 diabetes mellitus with diabetic chronic kidney disease: Secondary | ICD-10-CM

## 2023-05-05 DIAGNOSIS — E1159 Type 2 diabetes mellitus with other circulatory complications: Secondary | ICD-10-CM

## 2023-05-05 LAB — POCT GLYCOSYLATED HEMOGLOBIN (HGB A1C): Hemoglobin A1C: 7.3 % — AB (ref 4.0–5.6)

## 2023-05-05 MED ORDER — SEMAGLUTIDE (2 MG/DOSE) 8 MG/3ML ~~LOC~~ SOPN: 2.0000 mg | PEN_INJECTOR | SUBCUTANEOUS | 3 refills | Status: AC

## 2023-05-05 NOTE — Progress Notes (Signed)
 Name: Hayley Case  Age/ Sex: 73 y.o., female   MRN/ DOB: [id number], 09-19-1950     PCP: Hayley Alanis, FNP   Reason for Endocrinology Evaluation: Type 2 Diabetes Mellitus  Initial Endocrine Consultative Visit: 11/30/2018    PATIENT IDENTIFIER: [name] is a 73 y.o. female with a past medical history of HTN, T2DM, PAD, coronary artery disease and Dyslipidemia . The patient has followed with Endocrinology clinic since 11/30/2018 for consultative assistance with management of her diabetes.  DIABETIC HISTORY:  Hayley Case was diagnosed with T2DM > 20 yrs ago. She was on metformin, Trulicity , and Byetta. Has been on insulin  since ~ 2010.  Her hemoglobin A1c has ranged from 7.6 % in 2019, peaking at 10.7 % in 2020.     On her initial visit to our clinic, she had an A1c of 11.8%. She was on lantus /humalog  but was not taking regularly, switched to insulin  mix for ease of take.    Was on Lipitor but caused elevated LFT's requiring hospitalization.   Moved from [place] to help her son who is on peritoneal dialysis. Has lost another son to MI    By 06/2019 we attempted to put her on the V-Go but was cost prohibitive.   By 09/2019 we switched insulin  mix to MDi regimen due to persistent hyperglycemia   Switch Rybelsus  to Ozempic  06/2022  SUBJECTIVE:   During the last visit (12/29/2022): A1c 7.3%   Today (05/05/2023): Hayley Case is here for a follow up on diabetes.  She checks her blood sugars multiple times daily,through CGM. The patient has had hypoglycemic episodes since the last clinic visit.      Patient follows with cardiology for bilateral carotid artery stenosis, dyslipidemia, HTN, and peripheral artery disease , she had a follow-up 4//2025 for shortness of breath She continues to see a counselor for adjustment disorder  Son continues with hemodialysis  Denies nausea or vomiting except for rare nausea  Continues with  chronic constipation , takes  colace   She has chronic tingling of the feet, interested in diabetic shoes  HOME DIABETES REGIMEN:  Ozempic  2 mg weekly Toujeo  18 units once daily Novolog  10 units with each meal CF: Humalog  (BG -130/30)      Statin: Off due to elevated LFT's and myalgias  ACE-I/ARB: Yes     CONTINUOUS GLUCOSE MONITORING RECORD INTERPRETATION    Dates of Recording: 4/6-4/19/2025  Sensor description:freestyle libre 3 +  Results statistics:   CGM use % of time 28  Average and SD 161/31.7  Time in range   70     %  % Time Above 180 23  % Time above 250 7  % Time Below target 0   Glycemic patterns summary: BGs are optimal overnight and between the meals  Hyperglycemic episodes postprandial  Hypoglycemic episodes occurred N/A  Overnight periods: Optimal      DIABETIC COMPLICATIONS: Microvascular complications:  CKD III, DR , neuropathy  Last eye exam: Completed 06/2021   Macrovascular complications:   PVD Denies: CAD, CVA   HISTORY:  Past Medical History:  Past Medical History:  Diagnosis Date  . CKD (chronic kidney disease)   . Diabetes mellitus without complication (HCC)   . Hyperlipidemia   . Hypertension    Past Surgical History:  Past Surgical History:  Procedure Laterality Date  . BREAST BIOPSY    . BREAST EXCISIONAL BIOPSY Left    Social History:  reports that she has never smoked.  She has never used smokeless tobacco. She reports that she does not drink alcohol and does not use drugs. Family History:  Family History  Problem Relation Age of Onset  . Skin cancer Mother   . Heart attack Father   . Colon cancer Sister      HOME MEDICATIONS: Allergies as of 05/05/2023       Reactions   Lisinopril Cough   Atorvastatin  Other (See Comments)   Liver function test elevation Other reaction(s): liver issues   Penicillins Hives, Itching   Did it involve swelling of the face/tongue/throat, SOB, or low BP? No Did it involve sudden or severe rash/hives,  skin peeling, or any reaction on the inside of your mouth or nose? Yes Did you need to seek medical attention at a hospital or doctor's office? Yes When did it last happen?      2012 If all above answers are "NO", may proceed with cephalosporin use.   Rosuvastatin  Other (See Comments)   Myalgias / leg cramps   Sulfamethoxazole-trimethoprim Hives   Other reaction(s): Unknown   Penicillin G    Other reaction(s): Unknown        Medication List        Accurate as of May 05, 2023  8:52 AM. If you have any questions, ask your nurse or doctor.          ACETAMINOPHEN  PO Take 650 mg by mouth every 6 (six) hours as needed for headache or moderate pain.   albuterol  108 (90 Base) MCG/ACT inhaler Commonly known as: VENTOLIN  HFA Inhale 2 puffs into the lungs every 6 (six) hours as needed for wheezing or shortness of breath.   allopurinol  100 MG tablet Commonly known as: ZYLOPRIM  TAKE 1 TABLET BY MOUTH EVERY DAY   amLODipine  10 MG tablet Commonly known as: NORVASC  Take 1 tablet (10 mg total) by mouth daily.   COLACE PO Take 1 capsule by mouth daily.   cyanocobalamin 1000 MCG tablet Commonly known as: VITAMIN B12 Take 1,000 mcg by mouth daily.   ezetimibe  10 MG tablet Commonly known as: ZETIA  TAKE 1 TABLET BY MOUTH EVERY DAY   FreeStyle Libre 3 Sensor Misc 1 Device by Does not apply route daily in the afternoon. Place 1 sensor on the skin every 14 days. Use to check glucose continuously   loratadine 10 MG tablet Commonly known as: CLARITIN Take 10 mg by mouth daily as needed (seasonal allergies).   losartan  50 MG tablet Commonly known as: COZAAR  TAKE 1 TABLET BY MOUTH EVERY DAY   meclizine  12.5 MG tablet Commonly known as: ANTIVERT  Take 1 tablet (12.5 mg total) by mouth 3 (three) times daily as needed for dizziness.   NovoLOG  FlexPen 100 UNIT/ML FlexPen Generic drug: insulin  aspart Max daily 45 units What changed:  how much to take when to take this additional  instructions   Pen Needles 32G X 4 MM Misc 1 Device by Does not apply route in the morning, at noon, in the evening, and at bedtime.   REFRESH OP Apply to eye.   Repatha  SureClick 140 MG/ML Soaj Generic drug: Evolocumab  ADMINISTER 1 ML UNDER THE SKIN EVERY 14 DAYS   Semaglutide  (2 MG/DOSE) 8 MG/3ML Sopn Inject 2 mg as directed once a week.   SYSTANE FREE OP Apply 1 drop to eye as needed.   Toujeo  Max SoloStar 300 UNIT/ML Solostar Pen Generic drug: insulin  glargine (2 Unit Dial ) Inject 20 Units into the skin daily at 6 (six) AM. What changed:  how much to take   Vitamin D  50 MCG (2000 UT) Caps Take 4,000 Units by mouth daily.         OBJECTIVE:   Vital Signs: BP 122/80 (BP Location: Left Arm, Patient Position: Sitting, Cuff Size: Normal)   Pulse 100   Ht 5\' 4"  (1.626 m)   Wt 177 lb 9.6 oz (80.6 kg)   SpO2 99%   BMI 30.48 kg/m   Wt Readings from Last 3 Encounters:  05/05/23 177 lb 9.6 oz (80.6 kg)  04/17/23 177 lb (80.3 kg)  03/24/23 178 lb 6.4 oz (80.9 kg)     Exam: General: Pt appears well and is in NAD  Lungs: Clear with good BS bilat  Heart: RRR   Extremities: No  pretibial edema.  Neuro: MS is good with appropriate affect, pt is alert and Ox3       DM foot exam: 05/05/2023   The skin of the feet is intact without sores or ulcerations. The pedal pulses are  1+ b/l The sensation is decreased to a screening 5.07, 10 gram monofilament bilaterally     DATA REVIEWED:  Lab Results  Component Value Date   HGBA1C 7.3 (A) 05/05/2023   HGBA1C 7.3 (A) 12/29/2022   HGBA1C 7.3 (A) 06/27/2022    Latest Reference Range & Units 05/01/23 09:35  Sodium 134 - 144 mmol/L 143  Potassium 3.5 - 5.2 mmol/L 4.4  Chloride 96 - 106 mmol/L 107 (H)  CO2 20 - 29 mmol/L 21  Glucose 70 - 99 mg/dL 161 (H)  BUN 8 - 27 mg/dL 28 (H)  Creatinine 0.96 - 1.00 mg/dL 0.45 (H)  Calcium  8.7 - 10.3 mg/dL 9.7  BUN/Creatinine Ratio 12 - 28  14  eGFR >59 mL/min/1.73 25 (L)   Alkaline Phosphatase 44 - 121 IU/L 120  Albumin 3.8 - 4.8 g/dL 4.1  AST 0 - 40 IU/L 13  ALT 0 - 32 IU/L 10  Total Protein 6.0 - 8.5 g/dL 6.8  Total Bilirubin 0.0 - 1.2 mg/dL 0.5    Latest Reference Range & Units 05/01/23 09:35  Total CHOL/HDL Ratio 0.0 - 4.4 ratio 2.8  Cholesterol, Total 100 - 199 mg/dL 409  HDL Cholesterol >81 mg/dL 63  Triglycerides 0 - 191 mg/dL 478  VLDL Cholesterol Cal 5 - 40 mg/dL 21  LDL Chol Calc (NIH) 0 - 99 mg/dL 90  Globulin, Total 1.5 - 4.5 g/dL 2.7    ASSESSMENT / PLAN / RECOMMENDATIONS:   1) Type 2 Diabetes Mellitus, Optimally controlled, With CKD IV, retinopathy ,neuropathic  and macrovascular complications - Most recent A1c of 7.3%. Goal A1c < 7.5 %.    -A1c stable -Somehow the pharmacist continues to dispense Ozempic  1 mg, we will update the prescription again -I will decrease her basal insulin  due to tight BG's overnight - We have attempted to put her on the V-go but that was cost prohibitive  MEDICATIONS:  -Increase Ozempic  2 mg weekly -Decreased Toujeo  to 16 units once daily -Continue Novolog  10 units with each meal -CF: Novolog   (BG -130/30)     EDUCATION / INSTRUCTIONS: BG monitoring instructions: Patient is instructed to check her blood sugars 3 times a day, before meals I reviewed the Rule of 15 for the treatment of hypoglycemia in detail with the patient. Literature supplied.   2) Peripheral neuropathy:  -Symptoms are tolerable at this time - Due to CKD Iv I would avoid gabapentin or Lyrica, patient may use OTC topical capsaicin cream if needed,  patient was advised to wash hands thoroughly after use to prevent burning sensation  F/U in 6 months    Signed electronically by: Natale Bail, MD  [facility] Endocrinology  [facility] [address] [place], [place] [address] Phone: [phone] FAX: [phone]   CC: Hayley Alanis, FNP [address] [place] [address] Phone: [phone]  Fax: [phone]  Return to Endocrinology clinic as below: Future Appointments  Date Time Provider Department Center  05/05/2023  9:10 AM Anna-Marie Coller, Julian Obey, MD [facility] None  05/12/2023  9:30 AM [facility] 1 [facility]  05/14/2023 10:10 AM [facility] ANNUAL WELLNESS VISIT 2 [facility]  05/21/2023 11:00 AM Bauert, Terri W, LCSW [facility] None  05/25/2023 10:30 AM [facility] PHARMACIST [facility]  06/18/2023 11:00 AM Bauert, Terri W, LCSW [facility] None

## 2023-05-05 NOTE — Patient Instructions (Addendum)
-   Increase Ozempic  2 mg, once weekly - Decrease Toujeo  to 16 units once daily  - Continue  Novolog  10 units with each meal   -Novolog   correctional insulin : ADD extra units on insulin  to your meal-time Humalog  dose if your blood sugars are higher than 160. Use the scale below to help guide you:   Blood sugar before meal Number of units to inject  Less than 160 0 unit  161 -  190 1 units  191 -  220 2 units  221 -  250 3 units  251 -  280 4 units  281 -  310 5 units  311 -  340 6 units  341 -  370 7 units  371 -  400 8 units  401 - 430 9 units     If tingling of the feet becomes bothersome, you may try over-the-counter capsaicin cream, please make sure you wash your hands thoroughly after use to prevent burning sensation   HOW TO TREAT LOW BLOOD SUGARS (Blood sugar LESS THAN 70 MG/DL) Please follow the RULE OF 15 for the treatment of hypoglycemia treatment (when your (blood sugars are less than 70 mg/dL)   STEP 1: Take 15 grams of carbohydrates when your blood sugar is low, which includes:  3-4 GLUCOSE TABS  OR 3-4 OZ OF JUICE OR REGULAR SODA OR ONE TUBE OF GLUCOSE GEL    STEP 2: RECHECK blood sugar in 15 MINUTES STEP 3: If your blood sugar is still low at the 15 minute recheck --> then, go back to STEP 1 and treat AGAIN with another 15 grams of carbohydrates.

## 2023-05-05 NOTE — Telephone Encounter (Signed)
 Spoke with pt, pt states she had her lab work done on 05/01/2023.

## 2023-05-05 NOTE — Telephone Encounter (Signed)
 Are you okay putting these labs in your name?

## 2023-05-07 ENCOUNTER — Other Ambulatory Visit (HOSPITAL_BASED_OUTPATIENT_CLINIC_OR_DEPARTMENT_OTHER): Payer: Self-pay

## 2023-05-07 ENCOUNTER — Encounter (HOSPITAL_BASED_OUTPATIENT_CLINIC_OR_DEPARTMENT_OTHER): Payer: Self-pay

## 2023-05-07 DIAGNOSIS — E785 Hyperlipidemia, unspecified: Secondary | ICD-10-CM

## 2023-05-07 DIAGNOSIS — Z79899 Other long term (current) drug therapy: Secondary | ICD-10-CM

## 2023-05-07 DIAGNOSIS — I10 Essential (primary) hypertension: Secondary | ICD-10-CM

## 2023-05-12 ENCOUNTER — Ambulatory Visit (HOSPITAL_BASED_OUTPATIENT_CLINIC_OR_DEPARTMENT_OTHER)
Admission: RE | Admit: 2023-05-12 | Discharge: 2023-05-12 | Disposition: A | Discharge status: Home / Self Care | Source: Ambulatory Visit | Attending: Nurse Practitioner | Admitting: Nurse Practitioner

## 2023-05-12 DIAGNOSIS — R0602 Shortness of breath: Secondary | ICD-10-CM | POA: Diagnosis present

## 2023-05-12 LAB — ECHOCARDIOGRAM COMPLETE
AR max vel: 1.79 cm2
AV Area VTI: 1.81 cm2
AV Area mean vel: 1.76 cm2
AV Mean grad: 5 mmHg
AV Peak grad: 7.7 mmHg
Ao pk vel: 1.39 m/s
Area-P 1/2: 4.49 cm2
Calc EF: 55.1 %
MV M vel: 5.23 m/s
MV Peak grad: 109.4 mmHg
S' Lateral: 2.5 cm
Single Plane A2C EF: 55 %
Single Plane A4C EF: 55.2 %

## 2023-05-14 ENCOUNTER — Ambulatory Visit

## 2023-05-18 ENCOUNTER — Encounter (HOSPITAL_BASED_OUTPATIENT_CLINIC_OR_DEPARTMENT_OTHER): Payer: Self-pay

## 2023-05-18 ENCOUNTER — Other Ambulatory Visit: Payer: Self-pay | Admitting: Family Medicine

## 2023-05-21 ENCOUNTER — Ambulatory Visit: Payer: Medicare HMO | Admitting: Psychology

## 2023-05-21 DIAGNOSIS — F4323 Adjustment disorder with mixed anxiety and depressed mood: Secondary | ICD-10-CM

## 2023-05-21 NOTE — Progress Notes (Signed)
 [facility] Counselor/Therapist Progress Note  Patient ID: Hayley Case, MRN: [id number],    Date: 05/21/2023  Time Spent: 11:00am-11:55am   55 minutes   Treatment Type: Individual Therapy  Reported Symptoms: stress  Mental Status Exam: Appearance:  Casual     Behavior: Appropriate  Motor: Normal  Speech/Language:  Normal Rate  Affect: Appropriate  Mood: normal  Thought process: normal  Thought content:   WNL  Sensory/Perceptual disturbances:   WNL  Orientation: oriented to person, place, time/date, and situation  Attention: Good  Concentration: Good  Memory: WNL  Fund of knowledge:  Good  Insight:   Good  Judgment:  Good  Impulse Control: Good   Risk Assessment: Danger to Self:  No Self-injurious Behavior: No Danger to Others: No Duty to Warn:no Physical Aggression / Violence:No  Access to Firearms a concern: No  Gang Involvement:No   Subjective: Pt present for face-to-face individual therapy via video.  Pt consents to telehealth video session and is aware of limitations and benefits of virtual sessions.   Location of pt: home Location of therapist: home office.   Pt talked about her son who lives with her.  He broke his toe and pt is having to help with wound care.  This has been stressful for pt.  Pt also worries about her son a lot.  Pt talked about her son Hayley Case who lives in [place] visiting her.   She had a nice visit with him.  Hayley Case was also helpful with some things around the house.  Hayley Case shared that he has started to go to therapy.  Pt was glad he shared but she did not probe into why he is going.   Pt said her son Hayley Case who lives with her told her he was jealous when Hayley Case was visiting.   Pt does a lot of care giving for Hayley Case and gives his a lot of attention.   Pt talked about her health.   She has neuropathy and saw a foot doctor.  Pt wanted to get recommendations for shoes or insoles but he recommended gabapentin which pt does not  want to take bc of the side effects. Pt talked about the relationship between her son Hayley Case and her daughter Hayley Case.  They have not gotten along for a few years.   Addressed how this impacts pt.   It upsets pt that they do not get along and she feels in the middle at times.   Hayley Case is a dominant personality and pt tends to let things go her way.   Addressed how pt can communicate about her needs.  Pt tends to be conflict avoidant.  Worked on self care strategies. Provided supportive therapy.  Interventions: Cognitive Behavioral Therapy and Insight-Oriented  Diagnosis:  F43.23  Plan of Care: Recommend ongoing therapy.  Pt participated in setting treatment goals.   Pt wants to improve coping skills.  Plan to meet monthly.  Pt agrees with treatment plan.    Treatment Plan  (treatment plan target date:  11/18/2023) Client Abilities/Strengths  Pt is bright, engaging, and motivated for therapy.  Client Treatment Preferences  Individual therapy.  Client Statement of Needs  Improve copings skills and understand herself better. Improve self esteem.  Symptoms  Depressed or irritable mood. Excessive and/or unrealistic worry that is difficult to control occurring more days than not for at least 6 months about a number of events or activities. Hypervigilance (e.g., feeling constantly on edge, experiencing concentration difficulties, having trouble falling or staying  asleep, exhibiting a general state of irritability). Low self-esteem. Problems Addressed  Unipolar Depression, Anxiety Goals 1. Alleviate depressive symptoms and return to previous level of effective functioning. 2. Appropriately grieve the loss in order to normalize mood and to return to previously adaptive level of functioning. Objective Learn and implement behavioral strategies to overcome depression. Target Date: 2023-11-18 Frequency: Monthly  Progress: 50 Modality: individual  Related Interventions Assist the client in  developing skills that increase the likelihood of deriving pleasure from behavioral activation (e.g., assertiveness skills, developing an exercise plan, less internal/more external focus, increased social involvement); reinforce success. Engage the client in "behavioral activation," increasing his/her activity level and contact with sources of reward, while identifying processes that inhibit activation. use behavioral techniques such as instruction, rehearsal, role-playing, role reversal, as needed, to facilitate activity in the client's daily life; reinforce success. 3. Develop healthy interpersonal relationships that lead to the alleviation and help prevent the relapse of depression. 4. Develop healthy thinking patterns and beliefs about self, others, and the world that lead to the alleviation and help prevent the relapse of depression. 5. Enhance ability to effectively cope with the full variety of life's worries and anxieties. 6. Learn and implement coping skills that result in a reduction of anxiety and worry, and improved daily functioning. Objective Learn and implement problem-solving strategies for realistically addressing worries. Target Date: 2023-11-18 Frequency: Monthly  Progress: 50 Modality: individual  Related Interventions Assign the client a homework exercise in which he/she problem-solves a current problem (see Mastery of Your Anxiety and Worry: Workbook by Colbert Dates and Edna Gouty or Generalized Anxiety Disorder by Woodson He, and Edna Gouty); review, reinforce success, and provide corrective feedback toward improvement. Teach the client problem-solving strategies involving specifically defining a problem, generating options for addressing it, evaluating the pros and cons of each option, selecting and implementing an optional action, and reevaluating and refining the action. Objective Learn and implement calming skills to reduce overall anxiety and manage anxiety symptoms. Target Date:  2023-11-18 Frequency: Monthly  Progress: 50 Modality: individual  Related Interventions Assign the client to read about progressive muscle relaxation and other calming strategies in relevant books or treatment manuals (e.g., Progressive Relaxation Training by Juleen Oakland; Mastery of Your Anxiety and Worry: Workbook by Rodney Clamp). Assign the client homework each session in which he/she practices relaxation exercises daily, gradually applying them progressively from non-anxiety-provoking to anxiety-provoking situations; review and reinforce success while providing corrective feedback toward improvement. Teach the client calming/relaxation skills (e.g., applied relaxation, progressive muscle relaxation, cue controlled relaxation; mindful breathing; biofeedback) and how to discriminate better between relaxation and tension; teach the client how to apply these skills to his/her daily life. 7. Recognize, accept, and cope with feelings of depression. 8. Reduce overall frequency, intensity, and duration of the anxiety so that daily functioning is not impaired. 9. Resolve the core conflict that is the source of anxiety. 10. Stabilize anxiety level while increasing ability to function on a daily basis. Diagnosis F43.23 Conditions For Discharge Achievement of treatment goals and objectives   Willey Harrier, LCSW

## 2023-05-25 ENCOUNTER — Telehealth: Payer: Self-pay | Admitting: Pharmacist

## 2023-05-25 ENCOUNTER — Ambulatory Visit: Payer: Medicare HMO | Admitting: Pharmacist

## 2023-05-25 DIAGNOSIS — Z794 Long term (current) use of insulin: Secondary | ICD-10-CM

## 2023-05-25 DIAGNOSIS — N1832 Chronic kidney disease, stage 3b: Secondary | ICD-10-CM

## 2023-05-25 DIAGNOSIS — E1169 Type 2 diabetes mellitus with other specified complication: Secondary | ICD-10-CM

## 2023-05-25 NOTE — Telephone Encounter (Signed)
 Patient has CKD and type 2 DM. Would like to consider starting SGLT2 agent like Jardiance or Farxiga. Patient will see nephrologist 06/01/2023 - LM on VM of nurse at [facility] to get nephrologist's opinion about starting SGLT2 or have them start when in the office next week.

## 2023-05-25 NOTE — Progress Notes (Signed)
 Pharmacy Note  05/25/2023 Name: Hayley Case MRN: [id number] DOB: 1950-06-01  Subjective: Hayley Case is a 73 y.o. year old female who is a primary care patient of Adra Alanis, FNP. Clinical Pharmacist Practitioner referral was placed to assist with medication and hyperlipidemia management.    Engaged with patient by telephone for follow up visit today.   Diabetes:  Managed by endocrinology - Dr Rosalea Collin. Last appointment was 05/05/2023 Per last visit with Dr Rosalea Collin the following medication changes were recommended:  - Stat Ozempic  2 mg weekly - patient was supposed to be on this dose but pharmacy was still filling 1mg  dose.  - Decreased Toujeo  to 16 units once daily - Decrease  Novolog  10 units with each meal - but patient is still using 13 units with each meal (usually 2 meals per day)  - CF: Novolog   (BG -130/30)   Eye exam is currently up to date - scheduled for August 2025  Wt Readings from Last 3 Encounters:  05/05/23 177 lb 9.6 oz (80.6 kg)  04/17/23 177 lb (80.3 kg)  03/24/23 178 lb 6.4 oz (80.9 kg)    Uses Continuous Glucose Monitor - Freestyle Libre 3. She has set up account with [facility] and previously I have been able to see but last entry was 05/05/2023. Patient reports she is wearing a sensor but has not checked her phone app to monitor blood glucose recently. When she tried to look while on the phone - no blood glucose was registering.   Neuropathy - saw podiatrist recently. She was hoping to get diabetic shoes or inserts. She was prescribed gabapentin 300mg  3 times a day but she has not started yet because she was worried about side effect. She feels she needs to be alert to help her son.   Hyperlipidemia / bilateral carotid artery stenosis / PAD: LDL goal per cardiology office < 55.  Current therapy: Repatha  140mg  every 14 days (every other Sunday); Also taking ezetimibe  10mg  daily - states no indigestion lately.  At last visit with Dr  Rosalea Collin she recommended trail of capsaicin cream but patient states she forgot about trying this.   Past therapies: Atorvastatin  caused increased LFTs requiring hospitalization. Rosuvastatin  caused myalgias.   Started Praluent  150mg  every 14 days 06/17/2021 but stopped due to cost.  Switched to Repatha  - gets assistance with medication copay cost with [employer] (good thru 12/16/2023) Today patient reports she feels like she has indigestion after taking ezetimibe  before bedtime - does not occur daily.   Hypertension/CKD:  last three office blood pressures have been at or close to goal of < 130/80 (CKD) Last nephrology appointment - 02/10/2023 with Dr Edson Graces. Will see Dr Edson Graces again 06/01/2023 Current hypertension therapy: losartan  50mg  daily and amlodipine  10mg  daily   BP Readings from Last 3 Encounters:  05/05/23 122/80  04/17/23 130/72  03/24/23 132/84   Lab Results  Component Value Date   LABMICR See below: 07/06/2018   MICROALBUR 109.5 (H) 11/28/2022    Objective: Review of patient status, including review of consultants reports, laboratory and other test data, was performed as part of comprehensive.  Lab Results  Component Value Date   CREATININE 2.05 (H) 05/01/2023   CREATININE 2.26 (H) 10/07/2022   CREATININE 2.78 (H) 09/04/2022    Lab Results  Component Value Date   HGBA1C 7.3 (A) 05/05/2023       Component Value Date/Time   CHOL 174 05/01/2023 0935   TRIG 117 05/01/2023 0935  HDL 63 05/01/2023 0935   CHOLHDL 2.8 05/01/2023 0935   CHOLHDL 3 10/07/2022 0739   VLDL 22.0 10/07/2022 0739   LDLCALC 90 05/01/2023 0935   LDLDIRECT 79.0 10/07/2022 0739    Clinical ASCVD: Yes  The 10-year ASCVD risk score (Arnett DK, et al., 2019) is: 24.6%   Values used to calculate the score:     Age: 19 years     Sex: Female     Is Non-Hispanic African American: Yes     Diabetic: Yes     Tobacco smoker: No     Systolic Blood Pressure: 122 mmHg     Is BP  treated: Yes     HDL Cholesterol: 63 mg/dL     Total Cholesterol: 174 mg/dL      Allergies  Allergen Reactions   Lisinopril Cough   Atorvastatin  Other (See Comments)    Liver function test elevation Other reaction(s): liver issues   Penicillins Hives and Itching    Did it involve swelling of the face/tongue/throat, SOB, or low BP? No Did it involve sudden or severe rash/hives, skin peeling, or any reaction on the inside of your mouth or nose? Yes Did you need to seek medical attention at a hospital or doctor's office? Yes When did it last happen?      2012 If all above answers are "NO", may proceed with cephalosporin use.   Rosuvastatin  Other (See Comments)    Myalgias / leg cramps   Sulfamethoxazole-Trimethoprim Hives    Other reaction(s): Unknown   Penicillin G     Other reaction(s): Unknown    Medications Reviewed Today     Reviewed by Cecilie Coffee, RPH-CPP (Pharmacist) on 05/25/23 at 1243  Med List Status: <None>   Medication Order Taking? Sig Documenting Provider Last Dose Status Informant  ACETAMINOPHEN  PO [id number]  Take 650 mg by mouth every 6 (six) hours as needed for headache or moderate pain. [provider]  Active Self  albuterol  (VENTOLIN  HFA) 108 (90 Base) MCG/ACT inhaler [id number] Yes Inhale 2 puffs into the lungs every 6 (six) hours as needed for wheezing or shortness of breath. Everlina Hock, NP Taking Active   allopurinol  (ZYLOPRIM ) 100 MG tablet [id number] Yes TAKE 1 TABLET BY MOUTH EVERY DAY Adra Alanis, FNP Taking Active   amLODipine  (NORVASC ) 10 MG tablet [id number] Yes Take 1 tablet (10 mg total) by mouth daily. Adra Alanis, FNP Taking Active   Continuous Glucose Sensor (FREESTYLE LIBRE 3 SENSOR) [place] [id number] Yes 1 Device by Does not apply route daily in the afternoon. Place 1 sensor on the skin every 14 days. Use to check glucose continuously Shamleffer, Julian Obey, MD Taking Active            Med Note Alida Ion, Jaquita Merl   Wed Nov 26, 2022 10:41 AM) Gets thru DME - [facility]  Cyanocobalamin (VITAMIN B-12 PO) [id number] Yes Take 3,000 mcg by mouth daily. [provider] Taking Active            Med Note Alida Ion, Atleigh Gruen B   Mon May 25, 2023 11:12 AM) Not taking every day  Docusate Sodium  (COLACE PO) [id number] Yes Take 1 capsule by mouth daily. [provider] Taking Active   ezetimibe  (ZETIA ) 10 MG tablet [id number] Yes Take 1 tablet (10 mg total) by mouth daily. Adra Alanis, FNP Taking Active   gabapentin (NEURONTIN) 300 MG capsule [id number] No Take 300 mg by mouth 3 (three) times daily.  Patient not  taking: Reported on 05/25/2023   [provider] Not Taking Active            Med Note Alida Ion, Orinda Birkenhead May 25, 2023 10:44 AM) Patient has not started - worried about side effects  insulin  aspart (NOVOLOG  FLEXPEN) 100 UNIT/ML FlexPen [id number]  Max daily 45 units  Patient taking differently: 13 Units 3 (three) times daily with meals.   Shamleffer, Julian Obey, MD  Active            Med Note [facility], [place] B   Mon May 25, 2023 11:15 AM) Usually 2 meals per day  insulin  glargine, 2 Unit Dial , (TOUJEO  MAX SOLOSTAR) 300 UNIT/ML Solostar Pen [id number] Yes Inject 20 Units into the skin daily at 6 (six) AM.  Patient taking differently: Inject 16 Units into the skin daily at 6 (six) AM.   Shamleffer, Julian Obey, MD Taking Active   Insulin  Pen Needle (PEN NEEDLES) 32G X 4 MM MISC [id number]  1 Device by Does not apply route in the morning, at noon, in the evening, and at bedtime. Shamleffer, Ibtehal Jaralla, MD  Active   loratadine (CLARITIN) 10 MG tablet [id number]  Take 10 mg by mouth daily as needed (seasonal allergies). [provider]  Active Self  losartan  (COZAAR ) 50 MG tablet [id number] Yes TAKE 1 TABLET BY MOUTH EVERY DAY Ike Malady Honora Lutes, FNP Taking Active   meclizine  (ANTIVERT ) 12.5 MG tablet [id number]  Take 1 tablet (12.5 mg total) by mouth 3  (three) times daily as needed for dizziness. Adra Alanis, FNP  Active   Polyethyl Glycol-Propyl Glycol (SYSTANE FREE OP) [id number]  Apply 1 drop to eye as needed. [provider]  Active   Polyvinyl Alcohol-Povidone (REFRESH OP) [id number]  Apply to eye. [provider]  Active   REPATHA  SURECLICK 140 MG/ML Stevens Eland [id number] Yes ADMINISTER 1 ML UNDER THE SKIN EVERY 14 DAYS O'Neal, Cathay Clonts, MD Taking Active   Semaglutide , 2 MG/DOSE, 8 MG/3ML SOPN [id number] Yes Inject 2 mg as directed once a week. Shamleffer, Julian Obey, MD Taking Active   VITAMIN D  PO [id number] Yes Take 5,000 Units by mouth daily. 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 04/21/2022   Primary osteoarthritis of right knee 03/31/2022   Type 2 diabetes mellitus with stage 4 chronic kidney disease, with long-term current use of insulin  (HCC) 07/02/2021   Arthritis of hip 04/18/2021   Background diabetic retinopathy (HCC) 04/18/2021   Chronic kidney disease, stage 3b (HCC) 04/18/2021   Dyslipidemia 04/18/2021   Family history of malignant neoplasm of digestive organs 04/18/2021   Gouty arthritis 04/18/2021   Hearing loss 04/18/2021   Hyperglycemia due to type 2 diabetes mellitus (HCC) 04/18/2021   Long term (current) use of insulin  (HCC) 04/18/2021   Obstructive sleep apnea (adult) (pediatric) 04/18/2021   Peripheral arterial disease (HCC) 04/18/2021   History of colonic polyps 04/18/2021   Flank pain 10/14/2019   Type 2 diabetes mellitus with diabetic polyneuropathy, with long-term current use of insulin  (HCC) 12/01/2018   Type 2 diabetes mellitus with retinopathy, with long-term current use of insulin  (HCC) 12/01/2018   Type 2 diabetes mellitus with stage 3b chronic kidney disease, with long-term current use of insulin  (HCC) 11/30/2018   Non-intractable vomiting    Acute hepatitis  07/13/2018   Acute on chronic  renal insufficiency 07/12/2018   Diabetes mellitus (HCC) 12/08/2016   Essential hypertension 12/08/2016   Hyperlipidemia associated with type 2 diabetes mellitus (HCC) 12/08/2016     Medication Assistance:  Re-Enrolled for [employer] Hyperlipidemia  - grant approved ($2500 - from 12/17/2022 thru 12/16/2023).      Assessment / Plan: Diabetes: Last A1c was at goal of < 7.5%  Continue Ozempic  2mg  weekly  Continue Tresiba 16 units daily.  Reviewed Novolog  regimen  Continue to follow up with Dr Rosalea Collin.  Continue to use Libre 3 sensors to check blood glucose 3 or more times per day.  Patient will removed current sensor and replace it. If still not connecting to her phone app, she will call me back for assistance.   Hyperlipidemia / PAD / bilateral artery stenosis: LDL goal per cardiology office < 55 Continue Repatha  140mg  every 14 days and ezetimibe  10mg  daily.   Reviewed Repatha  injection technique. If LDL still > 55 when rechecked in 1 to 2 months, consider adding Nexlitol or change to different PCSK9.   Continue to use [employer] Grant - valid thru 12/2023   Hypertension/CKD:  Continue amlodipine  10mg  daily and losartan  50mg  daily. Check blood pressure 2 to 3 times per week.  Follow up as planned with [facility]. I called to discuss possibly adding SGLT2 to regimen - has to leave a message on VM. (Next appt with nephrology is 06/01/2023)  Medication management:  Reviewed and updated medication list Reviewed refill history and adherence  Neuropathy:  Encouraged patient to try capsaicin cream If capsaicin is not effective, could try lower dose of gabapentin in future  Would titrate up as needed while monitoring for side effects / drowsiness.    Follow Up:  Telephone follow up appointment with care management team member scheduled for:  April / May 2025   Cecilie Coffee, PharmD Clinical Pharmacist [facility]  - [facility]

## 2023-05-26 ENCOUNTER — Ambulatory Visit (INDEPENDENT_AMBULATORY_CARE_PROVIDER_SITE_OTHER)

## 2023-05-26 VITALS — Ht 64.0 in | Wt 177.0 lb

## 2023-05-26 DIAGNOSIS — Z0000 Encounter for general adult medical examination without abnormal findings: Secondary | ICD-10-CM | POA: Diagnosis not present

## 2023-05-26 NOTE — Progress Notes (Signed)
 Subjective:   Hayley Case is a 73 y.o. who presents for a Medicare Wellness preventive visit.  As a reminder, Annual Wellness Visits don't include a physical exam, and some assessments may be limited, especially if this visit is performed virtually. We may recommend an in-person visit if needed.  Visit Complete: Virtual I connected with  Hayley Case on 05/26/23 by a audio enabled telemedicine application and verified that I am speaking with the correct person using two identifiers.  Patient Location: Home  Provider Location: Home Office  I discussed the limitations of evaluation and management by telemedicine. The patient expressed understanding and agreed to proceed.  Vital Signs: Because this visit was a virtual/telehealth visit, some criteria may be missing or patient reported. Any vitals not documented were not able to be obtained and vitals that have been documented are patient reported.  VideoDeclined- This patient declined Librarian, academic. Therefore the visit was completed with audio only.  Persons Participating in Visit: Patient.  AWV Questionnaire: Yes: Patient Medicare AWV questionnaire was completed by the patient on 05/25/23; I have confirmed that all information answered by patient is correct and no changes since this date.  Cardiac Risk Factors include: advanced age (>63men, >23 women);diabetes mellitus;dyslipidemia;hypertension     Objective:     Today's Vitals   05/26/23 1007  Weight: 177 lb (80.3 kg)  Height: 5\' 4"  (1.626 m)   Body mass index is 30.38 kg/m.     05/26/2023   10:30 AM 05/08/2022   10:19 AM 04/24/2022   11:13 AM 04/18/2021   10:33 AM 10/17/2020    9:33 AM 07/12/2018   11:42 AM 10/15/2016    1:52 PM  Advanced Directives  Does Patient Have a Medical Advance Directive? Yes Yes Yes Yes Yes No No  Type of Estate agent of [place];Living will Healthcare Power of [place];Living will  Healthcare Power of [place];Living will Healthcare Power of [place];Living will Living will    Does patient want to make changes to medical advance directive? No - Patient declined  No - Patient declined      Copy of Healthcare Power of Attorney in Chart? Yes - validated most recent copy scanned in chart (See row information) Yes - validated most recent copy scanned in chart (See row information) Yes - validated most recent copy scanned in chart (See row information) Yes - validated most recent copy scanned in chart (See row information)     Would patient like information on creating a medical advance directive?      No - Patient declined No - Patient declined    Current Medications (verified) Outpatient Encounter Medications as of 05/26/2023  Medication Sig   ACETAMINOPHEN  PO Take 650 mg by mouth every 6 (six) hours as needed for headache or moderate pain.   albuterol  (VENTOLIN  HFA) 108 (90 Base) MCG/ACT inhaler Inhale 2 puffs into the lungs every 6 (six) hours as needed for wheezing or shortness of breath.   allopurinol  (ZYLOPRIM ) 100 MG tablet TAKE 1 TABLET BY MOUTH EVERY DAY   amLODipine  (NORVASC ) 10 MG tablet Take 1 tablet (10 mg total) by mouth daily.   Continuous Glucose Sensor (FREESTYLE LIBRE 3 SENSOR) MISC 1 Device by Does not apply route daily in the afternoon. Place 1 sensor on the skin every 14 days. Use to check glucose continuously   Cyanocobalamin (VITAMIN B-12 PO) Take 3,000 mcg by mouth daily.   Docusate Sodium  (COLACE PO) Take 1 capsule by mouth daily.  ezetimibe  (ZETIA ) 10 MG tablet Take 1 tablet (10 mg total) by mouth daily.   insulin  aspart (NOVOLOG  FLEXPEN) 100 UNIT/ML FlexPen Max daily 45 units (Patient taking differently: 13 Units 3 (three) times daily with meals.)   insulin  glargine, 2 Unit Dial , (TOUJEO  MAX SOLOSTAR) 300 UNIT/ML Solostar Pen Inject 20 Units into the skin daily at 6 (six) AM. (Patient taking differently: Inject 16 Units into the skin daily at 6 (six)  AM.)   Insulin  Pen Needle (PEN NEEDLES) 32G X 4 MM MISC 1 Device by Does not apply route in the morning, at noon, in the evening, and at bedtime.   loratadine (CLARITIN) 10 MG tablet Take 10 mg by mouth daily as needed (seasonal allergies).   losartan  (COZAAR ) 50 MG tablet TAKE 1 TABLET BY MOUTH EVERY DAY   meclizine  (ANTIVERT ) 12.5 MG tablet Take 1 tablet (12.5 mg total) by mouth 3 (three) times daily as needed for dizziness.   Polyethyl Glycol-Propyl Glycol (SYSTANE FREE OP) Apply 1 drop to eye as needed.   Polyvinyl Alcohol-Povidone (REFRESH OP) Apply to eye.   REPATHA  SURECLICK 140 MG/ML SOAJ ADMINISTER 1 ML UNDER THE SKIN EVERY 14 DAYS   Semaglutide , 2 MG/DOSE, 8 MG/3ML SOPN Inject 2 mg as directed once a week.   VITAMIN D  PO Take 5,000 Units by mouth daily.   gabapentin (NEURONTIN) 300 MG capsule Take 300 mg by mouth 3 (three) times daily. (Patient not taking: Reported on 05/26/2023)   No facility-administered encounter medications on file as of 05/26/2023.    Allergies (verified) Lisinopril, Atorvastatin , Penicillins, Rosuvastatin , Sulfamethoxazole-trimethoprim, and Penicillin g   History: Past Medical History:  Diagnosis Date   Allergy    Anxiety    Arthritis    Asthma    Cataract    CKD (chronic kidney disease)    COPD (chronic obstructive pulmonary disease) (HCC)    Depression    Diabetes mellitus without complication (HCC)    GERD (gastroesophageal reflux disease)    Hyperlipidemia    Hypertension    Sleep apnea    Past Surgical History:  Procedure Laterality Date   BREAST BIOPSY     BREAST EXCISIONAL BIOPSY Left    EYE SURGERY     TUBAL LIGATION     Family History  Problem Relation Age of Onset   Skin cancer Mother    Heart attack Father    Stroke Father    Colon cancer Sister    Arthritis Sister    Cancer Sister    Anxiety disorder Daughter    Asthma Daughter    Varicose Veins Daughter    Depression Son    Diabetes Son    Hearing loss Son     Hypertension Son    Kidney disease Son    Obesity Son    Early death Son    Hypertension Son    Anxiety disorder Daughter    Asthma Daughter    Varicose Veins Daughter    Depression Son    Diabetes Son    Hearing loss Son    Hypertension Son    Kidney disease Son    Obesity Son    Early death Son    Hypertension Son    Social History   Socioeconomic History   Marital status: Widowed    Spouse name: Not on file   Number of children: Not on file   Years of education: Not on file   Highest education level: Some college, no degree  Occupational History  Not on file  Tobacco Use   Smoking status: Never   Smokeless tobacco: Never  Vaping Use   Vaping status: Never Used  Substance and Sexual Activity   Alcohol use: No   Drug use: No   Sexual activity: Not Currently  Other Topics Concern   Not on file  Social History Narrative   Not on file   Social Drivers of Health   Financial Resource Strain: Low Risk  (05/26/2023)   Overall Financial Resource Strain (CARDIA)    Difficulty of Paying Living Expenses: Not very hard  Food Insecurity: Food Insecurity Present (05/26/2023)   Hunger Vital Sign    Worried About Running Out of Food in the Last Year: Sometimes true    Ran Out of Food in the Last Year: Sometimes true  Transportation Needs: No Transportation Needs (05/26/2023)   PRAPARE - Administrator, Civil Service (Medical): No    Lack of Transportation (Non-Medical): No  Physical Activity: Insufficiently Active (05/26/2023)   Exercise Vital Sign    Days of Exercise per Week: 1 day    Minutes of Exercise per Session: 30 min  Stress: Stress Concern Present (05/26/2023)   [name] of Occupational Health - Occupational Stress Questionnaire    Feeling of Stress : To some extent  Social Connections: Moderately Integrated (05/26/2023)   Social Connection and Isolation Panel [NHANES]    Frequency of Communication with Friends and Family: More than three times  a week    Frequency of Social Gatherings with Friends and Family: Never    Attends Religious Services: More than 4 times per year    Active Member of [employer] or Organizations: Yes    Attends Banker Meetings: More than 4 times per year    Marital Status: Widowed    Tobacco Counseling Counseling given: Not Answered    Clinical Intake:  Pre-visit preparation completed: Yes  Pain : No/denies pain  Diabetes: No  Lab Results  Component Value Date   HGBA1C 7.3 (A) 05/05/2023   HGBA1C 7.3 (A) 12/29/2022   HGBA1C 7.3 (A) 06/27/2022     How often do you need to have someone help you when you read instructions, pamphlets, or other written materials from your doctor or pharmacy?: 1 - Never  Interpreter Needed?: No  Information entered by :: Seabron Cypress LPN   Activities of Daily Living     05/25/2023   11:51 AM  In your present state of health, do you have any difficulty performing the following activities:  Hearing? 1  Vision? 1  Difficulty concentrating or making decisions? 1  Walking or climbing stairs? 1  Dressing or bathing? 0  Doing errands, shopping? 0  Preparing Food and eating ? N  Using the Toilet? N  In the past six months, have you accidently leaked urine? Y  Do you have problems with loss of bowel control? N  Managing your Medications? N  Managing your Finances? N  Housekeeping or managing your Housekeeping? N    Patient Care Team: Adra Alanis, FNP as PCP - General (Internal Medicine) O'Neal, Cathay Clonts, MD as PCP - Cardiology (Cardiology) Cecilie Coffee, RPH-CPP (Pharmacist) Wenona Hamilton, MD as Consulting Physician (Cardiology) Alvina Axon, MD as Consulting Physician (Ophthalmology) Alvina Axon, MD as Consulting Physician (Ophthalmology)  Indicate any recent Medical Services you may have received from other than [facility] providers in the past year (date may be approximate).     Assessment:    This is a  routine  wellness examination for Lon[place].  Hearing/Vision screen Hearing Screening - Comments:: Denies hearing difficulties   Vision Screening - Comments:: Wears rx glasses - up to date with routine eye exams with [facility]    Goals Addressed             This Visit's Progress    DIET - INCREASE WATER INTAKE   On track      Depression Screen     05/26/2023   10:28 AM 07/03/2022    2:41 PM 04/24/2022   11:03 AM 04/18/2021   10:20 AM 10/19/2020    9:38 AM 09/18/2020    8:59 AM 07/06/2018   11:02 AM  PHQ 2/9 Scores  PHQ - 2 Score 0 1 6 1 1  0 6  PHQ- 9 Score 7  14    20     Fall Risk     05/25/2023   11:51 AM 04/17/2022   10:52 AM 02/14/2022    9:32 AM 04/18/2021   10:33 AM 10/19/2020    9:37 AM  Fall Risk   Falls in the past year? 0 0 0 0 0  Number falls in past yr: 0 0 0 0 0  Injury with Fall? 0 0 0 0 0  Risk for fall due to : Impaired mobility No Fall Risks No Fall Risks No Fall Risks No Fall Risks  Follow up Falls prevention discussed;Education provided;Falls evaluation completed Falls evaluation completed Falls evaluation completed Education provided Falls evaluation completed    MEDICARE RISK AT HOME:  Medicare Risk at Home Any stairs in or around the home?: (Patient-Rptd) Yes If so, are there any without handrails?: (Patient-Rptd) Yes Home free of loose throw rugs in walkways, pet beds, electrical cords, etc?: (Patient-Rptd) No Adequate lighting in your home to reduce risk of falls?: (Patient-Rptd) Yes Life alert?: (Patient-Rptd) No Use of a cane, walker or w/c?: (Patient-Rptd) Yes Grab bars in the bathroom?: (Patient-Rptd) No Shower chair or bench in shower?: (Patient-Rptd) No Elevated toilet seat or a handicapped toilet?: (Patient-Rptd) No  TIMED UP AND GO:  Was the test performed?  No  Cognitive Function: 6CIT completed        05/26/2023   10:30 AM 04/24/2022   11:09 AM 04/18/2021    1:04 PM 04/18/2021   10:35 AM  6CIT Screen  What Year? 0 points 0 points 0  points 0 points  What month? 0 points 0 points 0 points 0 points  What time? 0 points 0 points 0 points 0 points  Count back from 20 0 points 0 points 0 points 0 points  Months in reverse 0 points 0 points 0 points 0 points  Repeat phrase 0 points 0 points 0 points 0 points  Total Score 0 points 0 points 0 points 0 points    Immunizations Immunization History  Administered Date(s) Administered   Fluad Quad(high Dose 65+) 10/19/2020, 11/01/2021   Fluad Trivalent(High Dose 65+) 11/28/2022   Influenza Split 12/17/2018, 12/23/2018   Influenza, High Dose Seasonal PF 12/16/2019   Influenza,inj,Quad PF,6+ Mos 11/13/2017   Influenza-Unspecified 12/17/2018   PFIZER Comirnaty(Gray Top)Covid-19 Tri-Sucrose Vaccine 11/18/2019   PFIZER(Purple Top)SARS-COV-2 Vaccination 03/11/2019, 04/05/2019, 11/18/2019   PNEUMOCOCCAL CONJUGATE-20 02/14/2022   Pneumococcal Polysaccharide-23 12/23/2018   Tdap 07/27/2019   Zoster, Live 07/27/2019, 12/01/2019    Screening Tests Health Maintenance  Topic Date Due   Zoster Vaccines- Shingrix (1 of 2) 04[birth year]02/1970   COVID-19 Vaccine (5 - 2024-25 season) 09/14/2022   OPHTHALMOLOGY EXAM  07/16/2023  INFLUENZA VACCINE  08/14/2023   HEMOGLOBIN A1C  11/04/2023   Diabetic kidney evaluation - Urine ACR  11/28/2023   Diabetic kidney evaluation - eGFR measurement  04/30/2024   FOOT EXAM  05/04/2024   Medicare Annual Wellness (AWV)  05/25/2024   MAMMOGRAM  08/19/2024   DEXA SCAN  05/12/2027   DTaP/Tdap/Td (2 - Td or Tdap) 07/26/2029   Colonoscopy  10/29/2032   Pneumonia Vaccine 45+ Years old  Completed   Hepatitis C Screening  Completed   HPV VACCINES  Aged Out   Meningococcal B Vaccine  Aged Out    Health Maintenance  Health Maintenance Due  Topic Date Due   Zoster Vaccines- Shingrix (1 of 2) 04/02/1970   COVID-19 Vaccine (5 - 2024-25 season) 09/14/2022    Additional Screening:  Vision Screening: Recommended annual ophthalmology exams for early detection  of glaucoma and other disorders of the eye.  Dental Screening: Recommended annual dental exams for proper oral hygiene  Community Resource Referral / Chronic Care Management: CRR required this visit?  No   CCM required this visit?  No   Plan:    I have personally reviewed and noted the following in the patient's chart:   Medical and social history Use of alcohol, tobacco or illicit drugs  Current medications and supplements including opioid prescriptions. Patient is not currently taking opioid prescriptions. Functional ability and status Nutritional status Physical activity Advanced directives List of other physicians Hospitalizations, surgeries, and ER visits in previous 12 months Vitals Screenings to include cognitive, depression, and falls Referrals and appointments  In addition, I have reviewed and discussed with patient certain preventive protocols, quality metrics, and best practice recommendations. A written personalized care plan for preventive services as well as general preventive health recommendations were provided to patient.   Seabron Cypress [place], [place]   [id number]   After Visit Summary: (MyChart) Due to this being a telephonic visit, the after visit summary with patients personalized plan was offered to patient via MyChart   Notes: Nothing significant to report at this time.

## 2023-05-26 NOTE — Patient Instructions (Signed)
 Ms. Winns , Thank you for taking time out of your busy schedule to complete your Annual Wellness Visit with me. I enjoyed our conversation and look forward to speaking with you again next year. I, as well as your care team,  appreciate your ongoing commitment to your health goals. Please review the following plan we discussed and let me know if I can assist you in the future. Your Game plan/ To Do List     Follow up Visits: Next Medicare AWV with our clinical staff: In 1 year    Have you seen your provider in the last 6 months (3 months if uncontrolled diabetes)? Yes Next Office Visit with your provider: please schedule   Clinician Recommendations:  Aim for 30 minutes of exercise or brisk walking, 6-8 glasses of water, and 5 servings of fruits and vegetables each day.   Information for [facility]:  [address]  [place] [address]            Phone [phone]       This is a list of the screening recommended for you and due dates:  Health Maintenance  Topic Date Due   Zoster (Shingles) Vaccine (1 of 2) 04/02/1970   COVID-19 Vaccine (5 - 2024-25 season) 09/14/2022   Eye exam for diabetics  07/16/2023   Flu Shot  08/14/2023   Hemoglobin A1C  11/04/2023   Yearly kidney health urinalysis for diabetes  11/28/2023   Yearly kidney function blood test for diabetes  04/30/2024   Complete foot exam   05/04/2024   Medicare Annual Wellness Visit  05/25/2024   Mammogram  08/19/2024   DEXA scan (bone density measurement)  05/12/2027   DTaP/Tdap/Td vaccine (2 - Td or Tdap) 07/26/2029   Colon Cancer Screening  10/29/2032   Pneumonia Vaccine  Completed   Hepatitis C Screening  Completed   HPV Vaccine  Aged Out   Meningitis B Vaccine  Aged Out    Advanced directives: (In Chart) A copy of your advanced directives are scanned into your chart should your provider ever need it. Advance Care Planning is important because it:  [x]  Makes sure you receive the medical care that is consistent with  your values, goals, and preferences  [x]  It provides guidance to your family and loved ones and reduces their decisional burden about whether or not they are making the right decisions based on your wishes.  Follow the link provided in your after visit summary or read over the paperwork we have mailed to you to help you started getting your Advance Directives in place. If you need assistance in completing these, please reach out to us  so that we can help you!  See attachments for Preventive Care and Fall Prevention Tips.

## 2023-06-12 ENCOUNTER — Other Ambulatory Visit: Payer: Self-pay | Admitting: Family

## 2023-06-18 ENCOUNTER — Ambulatory Visit (INDEPENDENT_AMBULATORY_CARE_PROVIDER_SITE_OTHER): Admitting: Psychology

## 2023-06-18 DIAGNOSIS — F4323 Adjustment disorder with mixed anxiety and depressed mood: Secondary | ICD-10-CM

## 2023-06-18 NOTE — Progress Notes (Signed)
 [facility] Counselor/Therapist Progress Note  Patient ID: Hayley Case, MRN: [id number],    Date: 06/18/2023  Time Spent: 11:00am-11:55am   55 minutes   Treatment Type: Individual Therapy  Reported Symptoms: stress, worry  Mental Status Exam: Appearance:  Casual     Behavior: Appropriate  Motor: Normal  Speech/Language:  Normal Rate  Affect: Appropriate  Mood: normal  Thought process: normal  Thought content:   WNL  Sensory/Perceptual disturbances:   WNL  Orientation: oriented to person, place, time/date, and situation  Attention: Good  Concentration: Good  Memory: WNL  Fund of knowledge:  Good  Insight:   Good  Judgment:  Good  Impulse Control: Good   Risk Assessment: Danger to Self:  No Self-injurious Behavior: No Danger to Others: No Duty to Warn:no Physical Aggression / Violence:No  Access to Firearms a concern: No  Gang Involvement:No   Subjective: Pt present for face-to-face individual therapy via video.  Pt consents to telehealth video session and is aware of limitations and benefits of virtual sessions.   Location of pt: home Location of therapist: home office.   Pt talked about her son Hayley Case being in the hospital.   He was having chest pains so pt had to call EMS.   They transported him to the hospital.  Pt went to the hospital and was there for a long time.  When she drove home the police stopped her bc her lights weren't on.  Pt was worried but the police did not give her a ticket.  Pt is worried about her son bc she has had a son die of a heart attack.    Addressed pt's worries.   Pt's son Hayley Case has been calling pt a lot to check in on her and provided support.   Pt's daughter Hayley Case is keeping in touch with pt as well.   Pt talked about her siblings.  They are very close and stay in touch a lot.  They use humor often which is therapeutic for pt.  Worked on self care strategies. Provided supportive therapy.  Interventions: Cognitive  Behavioral Therapy and Insight-Oriented  Diagnosis:  F43.23  Plan of Care: Recommend ongoing therapy.  Pt participated in setting treatment goals.   Pt wants to improve coping skills.  Plan to meet monthly.  Pt agrees with treatment plan.    Treatment Plan  (treatment plan target date:  11/18/2023) Client Abilities/Strengths  Pt is bright, engaging, and motivated for therapy.  Client Treatment Preferences  Individual therapy.  Client Statement of Needs  Improve copings skills and understand herself better. Improve self esteem.  Symptoms  Depressed or irritable mood. Excessive and/or unrealistic worry that is difficult to control occurring more days than not for at least 6 months about a number of events or activities. Hypervigilance (e.g., feeling constantly on edge, experiencing concentration difficulties, having trouble falling or staying asleep, exhibiting a general state of irritability). Low self-esteem. Problems Addressed  Unipolar Depression, Anxiety Goals 1. Alleviate depressive symptoms and return to previous level of effective functioning. 2. Appropriately grieve the loss in order to normalize mood and to return to previously adaptive level of functioning. Objective Learn and implement behavioral strategies to overcome depression. Target Date: 2023-11-18 Frequency: Monthly  Progress: 50 Modality: individual  Related Interventions Assist the client in developing skills that increase the likelihood of deriving pleasure from behavioral activation (e.g., assertiveness skills, developing an exercise plan, less internal/more external focus, increased social involvement); reinforce success. Engage the client in "behavioral  activation," increasing his/her activity level and contact with sources of reward, while identifying processes that inhibit activation. use behavioral techniques such as instruction, rehearsal, role-playing, role reversal, as needed, to facilitate activity in the  client's daily life; reinforce success. 3. Develop healthy interpersonal relationships that lead to the alleviation and help prevent the relapse of depression. 4. Develop healthy thinking patterns and beliefs about self, others, and the world that lead to the alleviation and help prevent the relapse of depression. 5. Enhance ability to effectively cope with the full variety of life's worries and anxieties. 6. Learn and implement coping skills that result in a reduction of anxiety and worry, and improved daily functioning. Objective Learn and implement problem-solving strategies for realistically addressing worries. Target Date: 2023-11-18 Frequency: Monthly  Progress: 50 Modality: individual  Related Interventions Assign the client a homework exercise in which he/she problem-solves a current problem (see Mastery of Your Anxiety and Worry: Workbook by Colbert Dates and Edna Gouty or Generalized Anxiety Disorder by Woodson He, and Edna Gouty); review, reinforce success, and provide corrective feedback toward improvement. Teach the client problem-solving strategies involving specifically defining a problem, generating options for addressing it, evaluating the pros and cons of each option, selecting and implementing an optional action, and reevaluating and refining the action. Objective Learn and implement calming skills to reduce overall anxiety and manage anxiety symptoms. Target Date: 2023-11-18 Frequency: Monthly  Progress: 50 Modality: individual  Related Interventions Assign the client to read about progressive muscle relaxation and other calming strategies in relevant books or treatment manuals (e.g., Progressive Relaxation Training by Juleen Oakland; Mastery of Your Anxiety and Worry: Workbook by Rodney Clamp). Assign the client homework each session in which he/she practices relaxation exercises daily, gradually applying them progressively from non-anxiety-provoking to anxiety-provoking  situations; review and reinforce success while providing corrective feedback toward improvement. Teach the client calming/relaxation skills (e.g., applied relaxation, progressive muscle relaxation, cue controlled relaxation; mindful breathing; biofeedback) and how to discriminate better between relaxation and tension; teach the client how to apply these skills to his/her daily life. 7. Recognize, accept, and cope with feelings of depression. 8. Reduce overall frequency, intensity, and duration of the anxiety so that daily functioning is not impaired. 9. Resolve the core conflict that is the source of anxiety. 10. Stabilize anxiety level while increasing ability to function on a daily basis. Diagnosis F43.23 Conditions For Discharge Achievement of treatment goals and objectives   Willey Harrier, LCSW

## 2023-06-22 ENCOUNTER — Other Ambulatory Visit: Payer: Self-pay | Admitting: Internal Medicine

## 2023-06-29 ENCOUNTER — Telehealth

## 2023-06-30 ENCOUNTER — Other Ambulatory Visit: Payer: Self-pay | Admitting: Pharmacist

## 2023-06-30 NOTE — Progress Notes (Signed)
 Pharmacy Note  06/30/2023 Name: [name] MRN: [id number] DOB: 1950-12-28  Subjective: Hayley Case is a 73 y.o. year old female who is a primary care patient of Hayley Alanis, FNP. Clinical Pharmacist Practitioner referral was placed to assist with medication and hyperlipidemia management.    Engaged with patient by telephone for follow up visit today.   Diabetes:  Managed by endocrinology - Dr Rosalea Collin. Last appointment was 05/05/2023 Per last visit with Dr Rosalea Collin the following medication changes were recommended:  - Stat Ozempic  2 mg weekly - patient was supposed to be on this dose but pharmacy was still filling 1mg  dose.  - Decreased Toujeo  to 16 units once daily - Decrease  Novolog  10 units with each meal - but patient is still using 13 units with each meal (usually 2 meals per day)  - CF: Novolog   (BG -130/30)   Eye exam is currently up to date - scheduled for August 2025  Wt Readings from Last 3 Encounters:  05/26/23 177 lb (80.3 kg)  05/05/23 177 lb 9.6 oz (80.6 kg)  04/17/23 177 lb (80.3 kg)    Uses Continuous Glucose Monitor - . Patient reports she is wearing a sensor. I check [facility] and could only seen reading for 06/28/2023. Patient reported initially that her app was showing that there was a connection error but when she went out and then back into app her sensor and phone reconnected and she could seen readings. She reported that over our 30 minute phone visit blood glucose decreased from 216 to 193. She did note that trend arrow was pointing downward.        Neuropathy - Her podiatrist prescribed gabapentin 300mg  3 times a day but she has not started yet because she is concerned about side effect. She feels she needs to be alert to help her son.   At last visit with Dr Rosalea Collin she recommended trail of capsaicin cream. Reminded patient about trying this at our last visit. She bought some capsaicin but has not tried yet.    Hyperlipidemia / bilateral carotid artery stenosis / PAD: LDL goal per cardiology office < 55.   Current therapy: Repatha  140mg  every 14 days (every other Sunday) and ezetimibe  10mg  daily   Past therapies: Atorvastatin  caused increased LFTs requiring hospitalization. Rosuvastatin  caused myalgias.  Praluent  150mg  every 14 days 06/17/2021 but stopped due to cost. Switched to Repatha  - gets assistance with medication copay cost with [employer] (good thru 12/16/2023)  Hypertension/CKD:  last three office blood pressures have been at or close to goal of < 130/80 (CKD) Last nephrology appointment - 05/2023 with Dr Edson Graces. Per patient he recommended she lower dose of vitamin D  from 5000 units daily to 2000 units daily. She has not made this change yet because she is trying to use up vitamin D  5000 unit tablets she has at home.   Vitamin D  = 75.1 (05/27/2023) PTH = 53 (05/27/2023)  Current hypertension therapy: losartan  50mg  daily and amlodipine  10mg  daily  Not on SGLT2 due to concerns about possible UTIs.   Hair Loss:  Patient mentions that she has noted recent hair loss and wonders if it could be related to her medications.    BP Readings from Last 3 Encounters:  05/05/23 122/80  04/17/23 130/72  03/24/23 132/84   Lab Results  Component Value Date   LABMICR See below: 07/06/2018   MICROALBUR 109.5 (H) 11/28/2022    Objective: Review of patient status, including  review of consultants reports, laboratory and other test data, was performed as part of comprehensive.  Lab Results  Component Value Date   CREATININE 2.05 (H) 05/01/2023   CREATININE 2.26 (H) 10/07/2022   CREATININE 2.78 (H) 09/04/2022    Lab Results  Component Value Date   HGBA1C 7.3 (A) 05/05/2023       Component Value Date/Time   CHOL 174 05/01/2023 0935   TRIG 117 05/01/2023 0935   HDL 63 05/01/2023 0935   CHOLHDL 2.8 05/01/2023 0935   CHOLHDL 3 10/07/2022 0739   VLDL 22.0 10/07/2022 0739    LDLCALC 90 05/01/2023 0935   LDLDIRECT 79.0 10/07/2022 0739    Clinical ASCVD: Yes  The ASCVD Risk score (Arnett DK, et al., 2019) failed to calculate for the following reasons:   The systolic blood pressure is missing      Allergies  Allergen Reactions   Lisinopril Cough   Atorvastatin  Other (See Comments)    Liver function test elevation Other reaction(s): liver issues   Penicillins Hives and Itching    Did it involve swelling of the face/tongue/throat, SOB, or low BP? No Did it involve sudden or severe rash/hives, skin peeling, or any reaction on the inside of your mouth or nose? Yes Did you need to seek medical attention at a hospital or doctor's office? Yes When did it last happen?      2012 If all above answers are NO, may proceed with cephalosporin use.   Rosuvastatin  Other (See Comments)    Myalgias / leg cramps   Sulfamethoxazole-Trimethoprim Hives    Other reaction(s): Unknown   Penicillin G     Other reaction(s): Unknown    Medications Reviewed Today     Reviewed by Cecilie Coffee, RPH-CPP (Pharmacist) on 06/30/23 at 1055  Med List Status: <None>   Medication Order Taking? Sig Documenting Provider Last Dose Status Informant  ACETAMINOPHEN  PO [id number] Yes Take 650 mg by mouth every 6 (six) hours as needed for headache or moderate pain. [provider]  Active Self  albuterol  (VENTOLIN  HFA) 108 (90 Base) MCG/ACT inhaler [id number] Yes Inhale 2 puffs into the lungs every 6 (six) hours as needed for wheezing or shortness of breath. Everlina Hock, NP  Active   allopurinol  (ZYLOPRIM ) 100 MG tablet [id number] Yes TAKE 1 TABLET BY MOUTH EVERY DAY Hayley Alanis, FNP  Active   amLODipine  (NORVASC ) 10 MG tablet [id number] Yes Take 1 tablet (10 mg total) by mouth daily. Hayley Alanis, FNP  Active   Continuous Glucose Sensor (FREESTYLE LIBRE 3 SENSOR) [place] [id number] Yes 1 Device by Does not apply route daily in the afternoon. Place 1 sensor on the skin  every 14 days. Use to check glucose continuously Shamleffer, Julian Obey, MD  Active            Med Note Alida Ion, Jaquita Merl   Wed Nov 26, 2022 10:41 AM) Gets thru DME - [facility]  Cyanocobalamin (VITAMIN B-12 PO) [id number] Yes Take 3,000 mcg by mouth daily. [provider]  Active            Med Note Alida Ion, Andersen Mckiver B   Mon May 25, 2023 11:12 AM) Not taking every day  Docusate Sodium  (COLACE PO) [id number] Yes Take 1 capsule by mouth daily. [provider]  Active   ezetimibe  (ZETIA ) 10 MG tablet [id number] Yes Take 1 tablet (10 mg total) by mouth daily. Hayley Alanis, FNP  Active   gabapentin (NEURONTIN) 300 MG capsule  [id number]  Take 300 mg by mouth 3 (three) times daily.  Patient not taking: Reported on 06/30/2023   [provider]  Active            Med Note Alida Ion, Orinda Birkenhead May 25, 2023 10:44 AM) Patient has not started - worried about side effects  insulin  aspart (NOVOLOG  FLEXPEN) 100 UNIT/ML FlexPen [id number] Yes Max daily 45 units  Patient taking differently: 10-13 Units 3 (three) times daily with meals.   Shamleffer, Julian Obey, MD  Active            Med Note [facility] [place] [facility]   Mon May 25, 2023 11:15 AM) Usually 2 meals per day  insulin  glargine, 2 Unit Dial , (TOUJEO  MAX SOLOSTAR) 300 UNIT/ML Solostar Pen [id number] Yes Inject 16 Units into the skin daily at 6 (six) AM. Shamleffer, Julian Obey, MD  Active   Insulin  Pen Needle (PEN NEEDLES) 32G X 4 MM MISC [id number] Yes 1 Device by Does not apply route in the morning, at noon, in the evening, and at bedtime. Shamleffer, Ibtehal Jaralla, MD  Active   loratadine (CLARITIN) 10 MG tablet [id number] Yes Take 10 mg by mouth daily as needed (seasonal allergies). [provider]  Active Self  losartan  (COZAAR ) 50 MG tablet [id number] Yes Take 1 tablet (50 mg total) by mouth daily. Hayley Alanis, FNP  Active   meclizine  (ANTIVERT ) 12.5 MG tablet [id number] Yes Take 1 tablet  (12.5 mg total) by mouth 3 (three) times daily as needed for dizziness. Hayley Alanis, FNP  Active   Polyethyl Glycol-Propyl Glycol (SYSTANE FREE OP) [id number]  Apply 1 drop to eye as needed. [provider]  Consider Medication Status and Discontinue (Patient Preference)   Polyvinyl Alcohol-Povidone (REFRESH OP) [id number] Yes Apply to eye. [provider]  Active   REPATHA  SURECLICK 140 MG/ML [place] [id number] Yes ADMINISTER 1 ML UNDER THE SKIN EVERY 14 DAYS O'Neal, Cathay Clonts, MD  Active   Semaglutide , 2 MG/DOSE, 8 MG/3ML SOPN [id number] Yes Inject 2 mg as directed once a week. Shamleffer, Ibtehal Jaralla, MD  Active   VITAMIN D  PO [id number] Yes Take 5,000 Units by mouth daily. 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 04/21/2022   Primary osteoarthritis of right knee 03/31/2022   Type 2 diabetes mellitus with stage 4 chronic kidney disease, with long-term current use of insulin  (HCC) 07/02/2021   Arthritis of hip 04/18/2021   Background diabetic retinopathy (HCC) 04/18/2021   Chronic kidney disease, stage 3b (HCC) 04/18/2021   Dyslipidemia 04/18/2021   Family history of malignant neoplasm of digestive organs 04/18/2021   Gouty arthritis 04/18/2021   Hearing loss 04/18/2021   Hyperglycemia due to type 2 diabetes mellitus (HCC) 04/18/2021   Long term (current) use of insulin  (HCC) 04/18/2021   Obstructive sleep apnea (adult) (pediatric) 04/18/2021   Peripheral arterial disease (HCC) 04/18/2021   History of colonic polyps 04/18/2021   Flank pain 10/14/2019   Type 2 diabetes mellitus with diabetic polyneuropathy, with long-term current use of insulin  (HCC) 12/01/2018   Type 2 diabetes mellitus with retinopathy, with long-term current use of insulin  (HCC) 12/01/2018   Type 2 diabetes mellitus with stage 3b chronic kidney disease, with long-term current use of  insulin  (HCC) 11/30/2018   Non-intractable vomiting    Acute hepatitis 07/13/2018  Acute on chronic renal insufficiency 07/12/2018   Diabetes mellitus (HCC) 12/08/2016   Essential hypertension 12/08/2016   Hyperlipidemia associated with type 2 diabetes mellitus (HCC) 12/08/2016     Medication Assistance:  Re-Enrolled for [employer] Hyperlipidemia  - grant approved ($2500 - from 12/17/2022 thru 12/16/2023).      Assessment / Plan: Diabetes: Last A1c was at goal of < 7.5%  Continue Ozempic  2mg  weekly  Continue Tresiba 16 units daily.  Reviewed Novolog  regimen  Continue to follow up with Dr Rosalea Collin.  Continue to use Libre 3 sensors to check blood glucose 3 or more times per day.  Reviewed with patient how to interpret trend arrows. She will continue to check blood glucose today every 5 minutes. If blood glucose continues to decrease to < 100 she should eat a small snack to prevent hypoglycemia.  Reviewed how to treat hypoglycemia < 70.  Hyperlipidemia / PAD / bilateral artery stenosis: LDL goal per cardiology office < 55 Continue Repatha  140mg  every 14 days and ezetimibe  10mg  daily.   Reviewed Repatha  injection technique. If LDL still > 55 when rechecked in 1 to 2 months, consider adding Nexlitol or change to different PCSK9.   Continue to use [employer] Grant - valid thru 12/2023   Hypertension/CKD:  Continue amlodipine  10mg  daily and losartan  50mg  daily. Check blood pressure 2 to 3 times per week.  Recheck urine microalbumin - not SGLT2 at this time due to concerns with possible UTIs.    Medication management:  Reviewed and updated medication list Reviewed refill history and adherence Discussed hair loss and medications - could be from weight loss, Ozempic  or losartan . Recommended she add Biotin 1 tablet daily   Neuropathy:  Again encouraged patient to try capsaicin cream If capsaicin is not effective, could try lower dose of gabapentin in future  Would titrate up as  needed while monitoring for side effects / drowsiness.    Follow Up:  Telephone follow up appointment with care management team member scheduled for:  August 2025   Cecilie Coffee, PharmD Clinical Pharmacist [facility]  - [facility]

## 2023-07-16 ENCOUNTER — Ambulatory Visit: Admitting: Psychology

## 2023-07-16 DIAGNOSIS — F4323 Adjustment disorder with mixed anxiety and depressed mood: Secondary | ICD-10-CM

## 2023-07-16 NOTE — Progress Notes (Signed)
 [facility] Counselor/Therapist Progress Note  Patient ID: Hayley Case, MRN: [id number],    Date: 07/16/2023  Time Spent: 11:00am-11:55am   55 minutes   Treatment Type: Individual Therapy  Reported Symptoms: stress, worry  Mental Status Exam: Appearance:  Casual     Behavior: Appropriate  Motor: Normal  Speech/Language:  Normal Rate  Affect: Appropriate  Mood: normal  Thought process: normal  Thought content:   WNL  Sensory/Perceptual disturbances:   WNL  Orientation: oriented to person, place, time/date, and situation  Attention: Good  Concentration: Good  Memory: WNL  Fund of knowledge:  Good  Insight:   Good  Judgment:  Good  Impulse Control: Good   Risk Assessment: Danger to Self:  No Self-injurious Behavior: No Danger to Others: No Duty to Warn:no Physical Aggression / Violence:No  Access to Firearms a concern: No  Gang Involvement:No   Subjective: Pt present for face-to-face individual therapy via video.  Pt consents to telehealth video session and is aware of limitations and benefits of virtual sessions.   Location of pt: home Location of therapist: home office.   Pt talked about her son [name].  He has been traveling for his job and pt has had to get up early to help him out.   [name]'s nurse told pt to have a talk with [name] about what she would do if something happened to him.  Pt states that is a hard conversation to have.  At times [name] talks about being tired and thinks it won't long before he passes.  This is upsetting for pt.    [name] can be demanding at times and asks pt to do things for him that he should be able to do himself.  Addressed the stress pt feels regarding care giving for [name].  Pt talked about her health.  She is not able to stand for very long bc of pain.   It is hard for her to stand in the kitchen to prepare meals for her son but she pushes herself to do it.   Pt talked about her son [name].  He is traveling to  [place] in August.  Pt worries about him when he travels.   Worked on self care strategies. Provided supportive therapy.  Interventions: Cognitive Behavioral Therapy and Insight-Oriented  Diagnosis:  F43.23  Plan of Care: Recommend ongoing therapy.  Pt participated in setting treatment goals.   Pt wants to improve coping skills.  Plan to meet monthly.  Pt agrees with treatment plan.    Treatment Plan  (treatment plan target date:  11/18/2023) Client Abilities/Strengths  Pt is bright, engaging, and motivated for therapy.  Client Treatment Preferences  Individual therapy.  Client Statement of Needs  Improve copings skills and understand herself better. Improve self esteem.  Symptoms  Depressed or irritable mood. Excessive and/or unrealistic worry that is difficult to control occurring more days than not for at least 6 months about a number of events or activities. Hypervigilance (e.g., feeling constantly on edge, experiencing concentration difficulties, having trouble falling or staying asleep, exhibiting a general state of irritability). Low self-esteem. Problems Addressed  Unipolar Depression, Anxiety Goals 1. Alleviate depressive symptoms and return to previous level of effective functioning. 2. Appropriately grieve the loss in order to normalize mood and to return to previously adaptive level of functioning. Objective Learn and implement behavioral strategies to overcome depression. Target Date: 2023-11-18 Frequency: Monthly  Progress: 50 Modality: individual  Related Interventions Assist the client in developing skills that  increase the likelihood of deriving pleasure from behavioral activation (e.g., assertiveness skills, developing an exercise plan, less internal/more external focus, increased social involvement); reinforce success. Engage the client in behavioral activation, increasing his/her activity level and contact with sources of reward, while identifying processes that  inhibit activation. use behavioral techniques such as instruction, rehearsal, role-playing, role reversal, as needed, to facilitate activity in the client's daily life; reinforce success. 3. Develop healthy interpersonal relationships that lead to the alleviation and help prevent the relapse of depression. 4. Develop healthy thinking patterns and beliefs about self, others, and the world that lead to the alleviation and help prevent the relapse of depression. 5. Enhance ability to effectively cope with the full variety of life's worries and anxieties. 6. Learn and implement coping skills that result in a reduction of anxiety and worry, and improved daily functioning. Objective Learn and implement problem-solving strategies for realistically addressing worries. Target Date: 2023-11-18 Frequency: Monthly  Progress: 50 Modality: individual  Related Interventions Assign the client a homework exercise in which he/she problem-solves a current problem (see Mastery of Your Anxiety and Worry: Workbook by Richarda and Jonne or Generalized Anxiety Disorder by Delores Filler, and Jonne); review, reinforce success, and provide corrective feedback toward improvement. Teach the client problem-solving strategies involving specifically defining a problem, generating options for addressing it, evaluating the pros and cons of each option, selecting and implementing an optional action, and reevaluating and refining the action. Objective Learn and implement calming skills to reduce overall anxiety and manage anxiety symptoms. Target Date: 2023-11-18 Frequency: Monthly  Progress: 50 Modality: individual  Related Interventions Assign the client to read about progressive muscle relaxation and other calming strategies in relevant books or treatment manuals (e.g., Progressive Relaxation Training by Thornell armin Collier; Mastery of Your Anxiety and Worry: Workbook by Richarda armin Jonne). Assign the client homework each  session in which he/she practices relaxation exercises daily, gradually applying them progressively from non-anxiety-provoking to anxiety-provoking situations; review and reinforce success while providing corrective feedback toward improvement. Teach the client calming/relaxation skills (e.g., applied relaxation, progressive muscle relaxation, cue controlled relaxation; mindful breathing; biofeedback) and how to discriminate better between relaxation and tension; teach the client how to apply these skills to his/her daily life. 7. Recognize, accept, and cope with feelings of depression. 8. Reduce overall frequency, intensity, and duration of the anxiety so that daily functioning is not impaired. 9. Resolve the core conflict that is the source of anxiety. 10. Stabilize anxiety level while increasing ability to function on a daily basis. Diagnosis F43.23 Conditions For Discharge Achievement of treatment goals and objectives   Veva Alma, LCSW

## 2023-08-12 ENCOUNTER — Ambulatory Visit (INDEPENDENT_AMBULATORY_CARE_PROVIDER_SITE_OTHER): Admitting: Medical

## 2023-08-12 ENCOUNTER — Other Ambulatory Visit (HOSPITAL_COMMUNITY)
Admission: RE | Admit: 2023-08-12 | Discharge: 2023-08-12 | Disposition: A | Discharge status: Home / Self Care | Source: Ambulatory Visit | Attending: Medical | Admitting: Medical

## 2023-08-12 ENCOUNTER — Ambulatory Visit: Payer: Self-pay

## 2023-08-12 ENCOUNTER — Ambulatory Visit: Payer: Self-pay | Admitting: Medical

## 2023-08-12 VITALS — BP 148/85 | HR 102 | Temp 97.7°F | Ht 64.0 in

## 2023-08-12 DIAGNOSIS — R944 Abnormal results of kidney function studies: Secondary | ICD-10-CM | POA: Diagnosis not present

## 2023-08-12 DIAGNOSIS — N1832 Chronic kidney disease, stage 3b: Secondary | ICD-10-CM

## 2023-08-12 DIAGNOSIS — N898 Other specified noninflammatory disorders of vagina: Secondary | ICD-10-CM | POA: Diagnosis not present

## 2023-08-12 DIAGNOSIS — R300 Dysuria: Secondary | ICD-10-CM | POA: Diagnosis not present

## 2023-08-12 DIAGNOSIS — Z87440 Personal history of urinary (tract) infections: Secondary | ICD-10-CM

## 2023-08-12 DIAGNOSIS — R350 Frequency of micturition: Secondary | ICD-10-CM

## 2023-08-12 DIAGNOSIS — D649 Anemia, unspecified: Secondary | ICD-10-CM

## 2023-08-12 LAB — CBC WITH DIFFERENTIAL/PLATELET
Absolute Lymphocytes: 2091 {cells}/uL (ref 850–3900)
Absolute Monocytes: 757 {cells}/uL (ref 200–950)
Basophils Absolute: 60 {cells}/uL (ref 0–200)
Basophils Relative: 0.7 %
Eosinophils Absolute: 349 {cells}/uL (ref 15–500)
Eosinophils Relative: 4.1 %
HCT: 38.4 % (ref 35.0–45.0)
Hemoglobin: 12.5 g/dL (ref 11.7–15.5)
MCH: 29.3 pg (ref 27.0–33.0)
MCHC: 32.6 g/dL (ref 32.0–36.0)
MCV: 90.1 fL (ref 80.0–100.0)
MPV: 9.4 fL (ref 7.5–12.5)
Monocytes Relative: 8.9 %
Neutro Abs: 5245 {cells}/uL (ref 1500–7800)
Neutrophils Relative %: 61.7 %
Platelets: 330 Thousand/uL (ref 140–400)
RBC: 4.26 Million/uL (ref 3.80–5.10)
RDW: 13.2 % (ref 11.0–15.0)
Total Lymphocyte: 24.6 %
WBC: 8.5 Thousand/uL (ref 3.8–10.8)

## 2023-08-12 LAB — POC URINALSYSI DIPSTICK (AUTOMATED)
Bilirubin, UA: NEGATIVE
Blood, UA: NEGATIVE
Glucose, UA: NEGATIVE
Ketones, UA: NEGATIVE
Nitrite, UA: NEGATIVE
Protein, UA: POSITIVE — AB
Spec Grav, UA: 1.01 (ref 1.010–1.025)
Urobilinogen, UA: 0.2 U/dL
pH, UA: 5 (ref 5.0–8.0)

## 2023-08-12 MED ORDER — FLUCONAZOLE 150 MG PO TABS: 150.0000 mg | ORAL_TABLET | Freq: Every day | ORAL | 0 refills | Status: DC

## 2023-08-12 MED ORDER — CIPROFLOXACIN HCL 250 MG PO TABS: 250.0000 mg | ORAL_TABLET | Freq: Two times a day (BID) | ORAL | 0 refills | Status: AC

## 2023-08-12 NOTE — Patient Instructions (Signed)
 Urinary tract infection Symptoms suggest UTI with recurrent history. Differential includes vulvovaginal candidiasis due to itching and diabetes. - Prescribed Cipro  250 mg twice daily for 3 days pending culture results.(start cipro  tomorrow) - Ordered urine culture to adjust antibiotic based on results. - Advised probiotic intake to mitigate antibiotic side effects. - Encouraged adequate hydration.  Vulvovaginal candidiasis Itching possibly related to incontinence pads. Differential includes yeast and bacterial infections, exacerbated by antibiotics. - Prescribed Diflucan , one tablet today. - Performed vaginal swab test to confirm yeast infection. - Instructed to note if itching improves with Diflucan  before starting Cipro .  Type 2 diabetes mellitus Diabetes management complicated by potential infection. No recent glucose readings or significant hyperglycemia alarms. - Monitored glucose levels, especially with infection. -cmp today will show glucose.  Chronic kidney disease Decreased GFR and reduced urine volume. Concern for infection or dehydration impact on kidney function. - Ordered blood test for kidney function, including creatinine and GFR. - Monitored kidney function closely, especially with reduced urine output and potential dehydration.  Urinary incontinence Incontinence managed with pads, contributing to vulvovaginal irritation and itching.  Follow up date to be determined after lab review

## 2023-08-12 NOTE — Progress Notes (Signed)
 Subjective:    Patient ID: Hayley Case, female    DOB: June 19, 1950, 73 y.o.   MRN: [id number]  HPI  Hayley Case is a 73 year old female with recurrent urinary tract infections and diabetes who presents with lower abdominal pain and urinary symptoms.  She has been experiencing lower abdominal pain over the bladder area for about a week, sometimes accompanied by dysuria. There is an increase in urinary frequency with small volumes, urinating approximately four to five times a day. She has a history of recurrent urinary tract infections, experiencing a couple each year.  She experienced a single episode of vomiting earlier in the week. No fevers, chills, or sweats. She has not been able to check her blood sugar levels today due to issues with her continuous glucose monitor, but it has not alerted her to any high or low readings recently. She manages her diabetes with a continuous glucose monitor.  She is allergic to penicillin and sulfa drugs and has previously been treated with Cipro  for urinary tract infections.  She reports itching, which she attributes to wearing a pad for incontinence, and has used Monistat spray to alleviate it. She has a history of yeast infections. No white vaginal dc recently.  She is the primary caregiver for her son, who requires dialysis three times a day, which adds to her daily responsibilities.   Review of Systems  Constitutional:  Negative for chills, fatigue and fever.  HENT:  Negative for congestion, drooling, ear discharge and ear pain.   Respiratory:  Negative for cough, chest tightness and wheezing.   Cardiovascular:  Negative for chest pain and palpitations.  Gastrointestinal:  Negative for abdominal pain, nausea and vomiting.  Genitourinary:  Positive for dysuria and frequency.       Vaginal itching.  Musculoskeletal:  Negative for back pain.  Skin:  Negative for rash.   Past Medical History:  Diagnosis Date   Allergy    Anxiety     Arthritis    Asthma    Cataract    CKD (chronic kidney disease)    COPD (chronic obstructive pulmonary disease) (HCC)    Depression    Diabetes mellitus without complication (HCC)    GERD (gastroesophageal reflux disease)    Hyperlipidemia    Hypertension    Sleep apnea      Social History   Socioeconomic History   Marital status: Widowed    Spouse name: Not on file   Number of children: Not on file   Years of education: Not on file   Highest education level: Some college, no degree  Occupational History   Not on file  Tobacco Use   Smoking status: Never   Smokeless tobacco: Never  Vaping Use   Vaping status: Never Used  Substance and Sexual Activity   Alcohol use: No   Drug use: No   Sexual activity: Not Currently  Other Topics Concern   Not on file  Social History Narrative   Not on file   Social Drivers of Health   Financial Resource Strain: Low Risk  (05/26/2023)   Overall Financial Resource Strain (CARDIA)    Difficulty of Paying Living Expenses: Not very hard  Food Insecurity: Food Insecurity Present (05/26/2023)   Hunger Vital Sign    Worried About Running Out of Food in the Last Year: Sometimes true    Ran Out of Food in the Last Year: Sometimes true  Transportation Needs: No Transportation Needs (05/26/2023)   PRAPARE -  Administrator, Civil Service (Medical): No    Lack of Transportation (Non-Medical): No  Physical Activity: Insufficiently Active (05/26/2023)   Exercise Vital Sign    Days of Exercise per Week: 1 day    Minutes of Exercise per Session: 30 min  Stress: Stress Concern Present (05/26/2023)   [name] of Occupational Health - Occupational Stress Questionnaire    Feeling of Stress : To some extent  Social Connections: Moderately Integrated (05/26/2023)   Social Connection and Isolation Panel    Frequency of Communication with Friends and Family: More than three times a week    Frequency of Social Gatherings with Friends  and Family: Never    Attends Religious Services: More than 4 times per year    Active Member of [employer] or Organizations: Yes    Attends Banker Meetings: More than 4 times per year    Marital Status: Widowed  Intimate Partner Violence: Not At Risk (05/26/2023)   Humiliation, Afraid, Rape, and Kick questionnaire    Fear of Current or Ex-Partner: No    Emotionally Abused: No    Physically Abused: No    Sexually Abused: No    Past Surgical History:  Procedure Laterality Date   BREAST BIOPSY     BREAST EXCISIONAL BIOPSY Left    EYE SURGERY     TUBAL LIGATION      Family History  Problem Relation Age of Onset   Skin cancer Mother    Heart attack Father    Stroke Father    Colon cancer Sister    Arthritis Sister    Cancer Sister    Anxiety disorder Daughter    Asthma Daughter    Varicose Veins Daughter    Depression Son    Diabetes Son    Hearing loss Son    Hypertension Son    Kidney disease Son    Obesity Son    Early death Son    Hypertension Son    Anxiety disorder Daughter    Asthma Daughter    Varicose Veins Daughter    Depression Son    Diabetes Son    Hearing loss Son    Hypertension Son    Kidney disease Son    Obesity Son    Early death Son    Hypertension Son     Allergies  Allergen Reactions   Lisinopril Cough   Atorvastatin  Other (See Comments)    Liver function test elevation Other reaction(s): liver issues   Penicillins Hives and Itching    Did it involve swelling of the face/tongue/throat, SOB, or low BP? No Did it involve sudden or severe rash/hives, skin peeling, or any reaction on the inside of your mouth or nose? Yes Did you need to seek medical attention at a hospital or doctor's office? Yes When did it last happen?      2012 If all above answers are NO, may proceed with cephalosporin use.   Rosuvastatin  Other (See Comments)    Myalgias / leg cramps   Sulfamethoxazole-Trimethoprim Hives    Other reaction(s): Unknown    Penicillin G     Other reaction(s): Unknown    Current Outpatient Medications on File Prior to Visit  Medication Sig Dispense Refill   ACETAMINOPHEN  PO Take 650 mg by mouth every 6 (six) hours as needed for headache or moderate pain.     albuterol  (VENTOLIN  HFA) 108 (90 Base) MCG/ACT inhaler Inhale 2 puffs into the lungs every 6 (six) hours  as needed for wheezing or shortness of breath. 1 g 3   allopurinol  (ZYLOPRIM ) 100 MG tablet TAKE 1 TABLET BY MOUTH EVERY DAY 90 tablet 3   amLODipine  (NORVASC ) 10 MG tablet Take 1 tablet (10 mg total) by mouth daily. 90 tablet 1   Continuous Glucose Sensor (FREESTYLE LIBRE 3 SENSOR) MISC 1 Device by Does not apply route daily in the afternoon. Place 1 sensor on the skin every 14 days. Use to check glucose continuously 6 each 3   Cyanocobalamin (VITAMIN B-12 PO) Take 3,000 mcg by mouth daily.     Docusate Sodium  (COLACE PO) Take 1 capsule by mouth daily.     ezetimibe  (ZETIA ) 10 MG tablet Take 1 tablet (10 mg total) by mouth daily. 90 tablet 1   gabapentin (NEURONTIN) 300 MG capsule Take 300 mg by mouth 3 (three) times daily.     insulin  aspart (NOVOLOG  FLEXPEN) 100 UNIT/ML FlexPen Max daily 45 units (Patient taking differently: 10-13 Units 3 (three) times daily with meals.) 45 mL 3   insulin  glargine, 2 Unit Dial , (TOUJEO  MAX SOLOSTAR) 300 UNIT/ML Solostar Pen Inject 16 Units into the skin daily at 6 (six) AM. 6 mL 1   Insulin  Pen Needle (PEN NEEDLES) 32G X 4 MM MISC 1 Device by Does not apply route in the morning, at noon, in the evening, and at bedtime. 400 each 3   loratadine (CLARITIN) 10 MG tablet Take 10 mg by mouth daily as needed (seasonal allergies).     losartan  (COZAAR ) 50 MG tablet Take 1 tablet (50 mg total) by mouth daily. 90 tablet 0   meclizine  (ANTIVERT ) 12.5 MG tablet Take 1 tablet (12.5 mg total) by mouth 3 (three) times daily as needed for dizziness. 30 tablet 0   Polyethyl Glycol-Propyl Glycol (SYSTANE FREE OP) Apply 1 drop to eye as  needed.     Polyvinyl Alcohol-Povidone (REFRESH OP) Apply to eye.     REPATHA  SURECLICK 140 MG/ML SOAJ ADMINISTER 1 ML UNDER THE SKIN EVERY 14 DAYS 6 mL 3   Semaglutide , 2 MG/DOSE, 8 MG/3ML SOPN Inject 2 mg as directed once a week. 9 mL 3   VITAMIN D  PO Take 5,000 Units by mouth daily.     No current facility-administered medications on file prior to visit.    BP (!) 148/85   Pulse (!) 102   Temp 97.7 F (36.5 C)   Ht 5' 4 (1.626 m)   SpO2 97%   BMI 30.38 kg/m        Objective:   Physical Exam  General- No acute distress. Pleasant patient. Neck- Full range of motion, no jvd Lungs- Clear, even and unlabored. Heart- regular rate and rhythm. Neurologic- CNII- XII grossly intact.  Abdomen- soft, +bs, no rebound or guarding. Mid suprapubic mild tender. Back- no cva tenderness.      Assessment & Plan:   Patient Instructions  Urinary tract infection Symptoms suggest UTI with recurrent history. Differential includes vulvovaginal candidiasis due to itching and diabetes. - Prescribed Cipro  250 mg twice daily for 3 days pending culture results.(start cipro  tomorrow) - Ordered urine culture to adjust antibiotic based on results. - Advised probiotic intake to mitigate antibiotic side effects. - Encouraged adequate hydration.  Vulvovaginal candidiasis Itching possibly related to incontinence pads. Differential includes yeast and bacterial infections, exacerbated by antibiotics. - Prescribed Diflucan , one tablet today. - Performed vaginal swab test to confirm yeast infection. - Instructed to note if itching improves with Diflucan  before starting Cipro .  Type  2 diabetes mellitus Diabetes management complicated by potential infection. No recent glucose readings or significant hyperglycemia alarms. - Monitored glucose levels, especially with infection. -cmp today will show glucose.  Chronic kidney disease Decreased GFR and reduced urine volume. Concern for infection or  dehydration impact on kidney function. - Ordered blood test for kidney function, including creatinine and GFR. - Monitored kidney function closely, especially with reduced urine output and potential dehydration.  Urinary incontinence Incontinence managed with pads, contributing to vulvovaginal irritation and itching.  Follow up date to be determined after lab review   Dallas Maxwell, PA-C

## 2023-08-12 NOTE — Telephone Encounter (Signed)
 Appt scheduled

## 2023-08-12 NOTE — Telephone Encounter (Signed)
 FYI Only or Action Required?: FYI only for provider.  Patient was last seen in primary care on 02/17/2023 by Jason Leita Repine, FNP.  Called Nurse Triage reporting UTI.  Symptoms began a week ago.  Interventions attempted: Rest, hydration, or home remedies.  Symptoms are: gradually worsening.  Triage Disposition: No disposition on file.  Patient/caregiver understands and will follow disposition?:  Reason for Disposition  Diabetes mellitus or weak immune system (e.g., HIV positive, cancer chemo, splenectomy, organ transplant, chronic steroids)  Answer Assessment - Initial Assessment Questions 1. SEVERITY: How bad is the pain?  (e.g., Scale 1-10; mild, moderate, or severe)     Moderate  2. FREQUENCY:      Increased frequency  3. PATTERN: Is pain present every time you urinate or just sometimes?      Every time  4. ONSET: When did the painful urination start?      Approximately one week ago, worsening  6. PAST UTI: Have you had a urine infection before? If Yes, ask: When was the last time? and What happened that time?      Yes  8. OTHER SYMPTOMS: Do you have any other symptoms? (e.g., blood in urine, flank pain, genital sores, urgency, vaginal discharge)     Abdominal discomfort  Protocols used: Urination Pain - Female-A-AH Copied from CRM #[id number]. Topic: Clinical - Red Word Triage >> Aug 12, 2023 12:56 PM Frederich PARAS wrote: [facility] that prompted transfer to Nurse Triage: pAIN &Burning  pt calling due to bottom of stomach, is painful, when she urinate it burns, she has been feeling like this for about a week,

## 2023-08-13 LAB — COMPLETE METABOLIC PANEL WITHOUT GFR
AG Ratio: 1.4 (calc) (ref 1.0–2.5)
ALT: 12 U/L (ref 6–29)
AST: 14 U/L (ref 10–35)
Albumin: 4.2 g/dL (ref 3.6–5.1)
Alkaline phosphatase (APISO): 113 U/L (ref 37–153)
BUN/Creatinine Ratio: 16 (calc) (ref 6–22)
BUN: 38 mg/dL — ABNORMAL HIGH (ref 7–25)
CO2: 24 mmol/L (ref 20–32)
Calcium: 10 mg/dL (ref 8.6–10.4)
Chloride: 105 mmol/L (ref 98–110)
Creat: 2.45 mg/dL — ABNORMAL HIGH (ref 0.60–1.00)
Globulin: 3 g/dL (ref 1.9–3.7)
Glucose, Bld: 148 mg/dL — ABNORMAL HIGH (ref 65–99)
Potassium: 4.8 mmol/L (ref 3.5–5.3)
Sodium: 141 mmol/L (ref 135–146)
Total Bilirubin: 0.6 mg/dL (ref 0.2–1.2)
Total Protein: 7.2 g/dL (ref 6.1–8.1)

## 2023-08-14 LAB — URINE CULTURE
MICRO NUMBER:: 16765136
SPECIMEN QUALITY:: ADEQUATE

## 2023-08-14 MED ORDER — CIPROFLOXACIN HCL 250 MG PO TABS: 250.0000 mg | ORAL_TABLET | Freq: Two times a day (BID) | ORAL | 0 refills | Status: AC

## 2023-08-14 NOTE — Addendum Note (Signed)
 Addended by: DORINA DALLAS HERO on: 08/14/2023 07:39 PM   Modules accepted: Orders

## 2023-08-17 ENCOUNTER — Other Ambulatory Visit: Payer: Self-pay | Admitting: *Deleted

## 2023-08-17 DIAGNOSIS — I6523 Occlusion and stenosis of bilateral carotid arteries: Secondary | ICD-10-CM

## 2023-08-17 LAB — CERVICOVAGINAL ANCILLARY ONLY
Bacterial Vaginitis (gardnerella): NEGATIVE
Comment: NEGATIVE

## 2023-08-20 ENCOUNTER — Ambulatory Visit (INDEPENDENT_AMBULATORY_CARE_PROVIDER_SITE_OTHER): Admitting: Psychology

## 2023-08-20 DIAGNOSIS — F4323 Adjustment disorder with mixed anxiety and depressed mood: Secondary | ICD-10-CM | POA: Diagnosis not present

## 2023-08-20 NOTE — Progress Notes (Signed)
 [facility] Counselor/Therapist Progress Note  Patient ID: Hayley Case, MRN: [id number],    Date: 08/20/2023  Time Spent: 11:00am-11:55am   55 minutes   Treatment Type: Individual Therapy  Reported Symptoms: stress, worry  Mental Status Exam: Appearance:  Casual     Behavior: Appropriate  Motor: Normal  Speech/Language:  Normal Rate  Affect: Appropriate  Mood: normal  Thought process: normal  Thought content:   WNL  Sensory/Perceptual disturbances:   WNL  Orientation: oriented to person, place, time/date, and situation  Attention: Good  Concentration: Good  Memory: WNL  Fund of knowledge:  Good  Insight:   Good  Judgment:  Good  Impulse Control: Good   Risk Assessment: Danger to Self:  No Self-injurious Behavior: No Danger to Others: No Duty to Warn:no Physical Aggression / Violence:No  Access to Firearms a concern: No  Gang Involvement:No   Subjective: Pt present for face-to-face individual therapy via video.  Pt consents to telehealth video session and is aware of limitations and benefits of virtual sessions.   Location of pt: home Location of therapist: home office.   Pt talked about being very tired bc she had to get up at 3am this morning to help her son Hayley Case.  Pt has had to do a lot of care giving and she is feeling exhausted.   Pt talked about her son Hayley Case.   Hayley Case can be demanding at times and asks pt to do things for him that he should be able to do himself.  Addressed the stress pt feels regarding care giving for Hayley Case.  Pt talked about having her brother and his wife to visit.  They stayed with pt and had a good time.   Pt's son Hayley Case is traveling to [place] and pt is worried about his travels.   Pt talked about her health.  She is not able to stand for very long bc of pain.   Addressed pt's pain and worked on Pharmacologist.  Pt talked about getting scam calls to try to get money from her.  The calls upset pt but she has not done what  they have asked from her.   Worked on self care strategies. Provided supportive therapy.  Interventions: Cognitive Behavioral Therapy and Insight-Oriented  Diagnosis:  F43.23  Plan of Care: Recommend ongoing therapy.  Pt participated in setting treatment goals.   Pt wants to improve coping skills.  Plan to meet monthly.  Pt agrees with treatment plan.    Treatment Plan  (treatment plan target date:  11/18/2023) Client Abilities/Strengths  Pt is bright, engaging, and motivated for therapy.  Client Treatment Preferences  Individual therapy.  Client Statement of Needs  Improve copings skills and understand herself better. Improve self esteem.  Symptoms  Depressed or irritable mood. Excessive and/or unrealistic worry that is difficult to control occurring more days than not for at least 6 months about a number of events or activities. Hypervigilance (e.g., feeling constantly on edge, experiencing concentration difficulties, having trouble falling or staying asleep, exhibiting a general state of irritability). Low self-esteem. Problems Addressed  Unipolar Depression, Anxiety Goals 1. Alleviate depressive symptoms and return to previous level of effective functioning. 2. Appropriately grieve the loss in order to normalize mood and to return to previously adaptive level of functioning. Objective Learn and implement behavioral strategies to overcome depression. Target Date: 2023-11-18 Frequency: Monthly  Progress: 50 Modality: individual  Related Interventions Assist the client in developing skills that increase the likelihood of deriving pleasure  from behavioral activation (e.g., assertiveness skills, developing an exercise plan, less internal/more external focus, increased social involvement); reinforce success. Engage the client in behavioral activation, increasing his/her activity level and contact with sources of reward, while identifying processes that inhibit activation. use behavioral  techniques such as instruction, rehearsal, role-playing, role reversal, as needed, to facilitate activity in the client's daily life; reinforce success. 3. Develop healthy interpersonal relationships that lead to the alleviation and help prevent the relapse of depression. 4. Develop healthy thinking patterns and beliefs about self, others, and the world that lead to the alleviation and help prevent the relapse of depression. 5. Enhance ability to effectively cope with the full variety of life's worries and anxieties. 6. Learn and implement coping skills that result in a reduction of anxiety and worry, and improved daily functioning. Objective Learn and implement problem-solving strategies for realistically addressing worries. Target Date: 2023-11-18 Frequency: Monthly  Progress: 50 Modality: individual  Related Interventions Assign the client a homework exercise in which he/she problem-solves a current problem (see Mastery of Your Anxiety and Worry: Workbook by Richarda and Jonne or Generalized Anxiety Disorder by Delores Filler, and Jonne); review, reinforce success, and provide corrective feedback toward improvement. Teach the client problem-solving strategies involving specifically defining a problem, generating options for addressing it, evaluating the pros and cons of each option, selecting and implementing an optional action, and reevaluating and refining the action. Objective Learn and implement calming skills to reduce overall anxiety and manage anxiety symptoms. Target Date: 2023-11-18 Frequency: Monthly  Progress: 50 Modality: individual  Related Interventions Assign the client to read about progressive muscle relaxation and other calming strategies in relevant books or treatment manuals (e.g., Progressive Relaxation Training by Thornell armin Collier; Mastery of Your Anxiety and Worry: Workbook by Richarda armin Jonne). Assign the client homework each session in which he/she practices  relaxation exercises daily, gradually applying them progressively from non-anxiety-provoking to anxiety-provoking situations; review and reinforce success while providing corrective feedback toward improvement. Teach the client calming/relaxation skills (e.g., applied relaxation, progressive muscle relaxation, cue controlled relaxation; mindful breathing; biofeedback) and how to discriminate better between relaxation and tension; teach the client how to apply these skills to his/her daily life. 7. Recognize, accept, and cope with feelings of depression. 8. Reduce overall frequency, intensity, and duration of the anxiety so that daily functioning is not impaired. 9. Resolve the core conflict that is the source of anxiety. 10. Stabilize anxiety level while increasing ability to function on a daily basis. Diagnosis F43.23 Conditions For Discharge Achievement of treatment goals and objectives   Veva Alma, LCSW

## 2023-08-25 ENCOUNTER — Encounter (HOSPITAL_COMMUNITY)

## 2023-08-25 ENCOUNTER — Ambulatory Visit: Admitting: Vascular Surgery

## 2023-09-01 ENCOUNTER — Other Ambulatory Visit: Payer: Self-pay | Admitting: Pharmacist

## 2023-09-01 NOTE — Progress Notes (Signed)
 Pharmacy Note  09/01/2023 Name: Hayley Case MRN: [id number] DOB: 06-Mar-1950  Subjective: Hayley Case is a 73 y.o. year old female who is a primary care patient of Jason Leita Repine, FNP. Clinical Pharmacist Practitioner referral was placed to assist with medication and hyperlipidemia management.    Engaged with patient by telephone for follow up visit today.   Diabetes:  Managed by endocrinology - Dr Sam. Last appointment was 05/05/2023  Current regimen:   - Ozempic  2 mg weekly  - Toujeo  to 16 units once daily - Novolog  10 units with each meal - but patient is using 10 to 13 units with each meal (usually 2 meals per day)  - CF: Novolog   (BG -130/30)   Eye exam is currently up to date - next scheduled for September 2025  Wt Readings from Last 3 Encounters:  05/26/23 177 lb (80.3 kg)  05/05/23 177 lb 9.6 oz (80.6 kg)  04/17/23 177 lb (80.3 kg)   Uses Continuous Glucose Monitor - . Patient reports she is wearing a sensor but the last day of data that shows in the LibreView app is 07/18/2023.  Current blood glucose is 212 (trend arrow is pointing downward) - She took 13 units of Novolog  about 1 hour ago before she ate breakfast.  Diet:  2 meals per day Morning - toast with butter, eggs and sometimes bacon. She does add [employer] creamer to coffee.  Sometimes has juices - cranberry   Neuropathy - Her podiatrist prescribed gabapentin 300mg  3 times a day but she has not started yet because she is concerned about side effect. She feels she needs to be alert to help her son.  She did try capsaicin cream and she feel that it is helping some.    Hyperlipidemia / bilateral carotid artery stenosis / PAD: LDL goal per cardiology office < 55.   Current therapy: Repatha  140mg  every 14 days (every other Sunday) and ezetimibe  10mg  daily   Past therapies: Atorvastatin  caused increased LFTs requiring hospitalization. Rosuvastatin  caused myalgias.  Praluent  150mg   every 14 days 06/17/2021 but stopped due to cost. Switched to Repatha  - gets assistance with medication copay cost with [employer] (good thru 12/16/2023)  Hypertension/CKD:  Last office blood pressure was above goal of < 130/80 (CKD) but previous BPs were at goal.  Last nephrology appointment - 05/2023 with Dr Dolan. Per patient he recommended she lower dose of vitamin D  from 5000 units daily to 2000 units daily. She is trying to use up the Vitamin D  5000 unit tablets so she is taking 5000 units every other day.   BP Readings from Last 3 Encounters:  08/12/23 (!) 148/85  05/05/23 122/80  04/17/23 130/72    Vitamin D  = 75.1 (05/27/2023) PTH = 53 (05/27/2023)  Current hypertension therapy: losartan  50mg  daily and amlodipine  10mg  daily  Not on SGLT2 due to concerns about possible UTIs.    BP Readings from Last 3 Encounters:  08/12/23 (!) 148/85  05/05/23 122/80  04/17/23 130/72    Objective: Review of patient status, including review of consultants reports, laboratory and other test data, was performed as part of comprehensive.  Lab Results  Component Value Date   CREATININE 2.45 (H) 08/12/2023   CREATININE 2.05 (H) 05/01/2023   CREATININE 2.26 (H) 10/07/2022    Lab Results  Component Value Date   HGBA1C 7.3 (A) 05/05/2023       Component Value Date/Time   CHOL 174 05/01/2023 0935   TRIG  117 05/01/2023 0935   HDL 63 05/01/2023 0935   CHOLHDL 2.8 05/01/2023 0935   CHOLHDL 3 10/07/2022 0739   VLDL 22.0 10/07/2022 0739   LDLCALC 90 05/01/2023 0935   LDLDIRECT 79.0 10/07/2022 0739    Clinical ASCVD: Yes  The 10-year ASCVD risk score (Arnett DK, et al., 2019) is: 32.9%   Values used to calculate the score:     Age: 35 years     Clincally relevant sex: Female     Is Non-Hispanic African American: Yes     Diabetic: Yes     Tobacco smoker: No     Systolic Blood Pressure: 148 mmHg     Is BP treated: Yes     HDL Cholesterol: 63 mg/dL     Total Cholesterol:  174 mg/dL      Allergies  Allergen Reactions   Lisinopril Cough   Atorvastatin  Other (See Comments)    Liver function test elevation Other reaction(s): liver issues   Penicillins Hives and Itching    Did it involve swelling of the face/tongue/throat, SOB, or low BP? No Did it involve sudden or severe rash/hives, skin peeling, or any reaction on the inside of your mouth or nose? Yes Did you need to seek medical attention at a hospital or doctor's office? Yes When did it last happen?      2012 If all above answers are NO, may proceed with cephalosporin use.   Rosuvastatin  Other (See Comments)    Myalgias / leg cramps   Sulfamethoxazole-Trimethoprim Hives    Other reaction(s): Unknown   Penicillin G     Other reaction(s): Unknown    Medications Reviewed Today     Reviewed by Carla Milling, RPH-CPP (Pharmacist) on 09/01/23 at 1131  Med List Status: <None>   Medication Order Taking? Sig Documenting Provider Last Dose Status Informant  ACETAMINOPHEN  PO [id number]  Take 650 mg by mouth every 6 (six) hours as needed for headache or moderate pain. [provider]  Active Self  albuterol  (VENTOLIN  HFA) 108 (90 Base) MCG/ACT inhaler [id number]  Inhale 2 puffs into the lungs every 6 (six) hours as needed for wheezing or shortness of breath. Almarie Waddell NOVAK, NP  Active   allopurinol  (ZYLOPRIM ) 100 MG tablet [id number] Yes TAKE 1 TABLET BY MOUTH EVERY DAY Jason Leita Repine, FNP  Active   amLODipine  (NORVASC ) 10 MG tablet [id number] Yes Take 1 tablet (10 mg total) by mouth daily. Jason Leita Repine, FNP  Active   Cholecalciferol (VITAMIN D ) 50 MCG (2000 UT) tablet [id number] Yes Take 2,000 Units by mouth daily. [provider]  Active   Continuous Glucose Sensor (FREESTYLE LIBRE 3 SENSOR) [place] [id number] Yes 1 Device by Does not apply route daily in the afternoon. Place 1 sensor on the skin every 14 days. Use to check glucose continuously Shamleffer, Donell Cardinal, MD   Active            Med Note JUSTINO, MILLING NOVAK   Wed Nov 26, 2022 10:41 AM) Gets thru DME - [facility]  Cyanocobalamin (VITAMIN B-12 PO) [id number] Yes Take 3,000 mcg by mouth daily. [provider]  Active            Med Note JUSTINO, Joyce Leckey B   Mon May 25, 2023 11:12 AM) Not taking every day  Docusate Sodium  (COLACE PO) [id number]  Take 1 capsule by mouth daily. [provider]  Active   ezetimibe  (ZETIA ) 10 MG tablet [id number] Yes Take 1 tablet (10 mg total)  by mouth daily. Jason Leita Repine, FNP  Active     Discontinued 09/01/23 1050 (Completed Course)   gabapentin (NEURONTIN) 300 MG capsule [id number]  Take 300 mg by mouth 3 (three) times daily.  Patient not taking: Reported on 09/01/2023   [provider]  Active            Med Note JUSTINO, MADELIN KATHEE Kitchens May 25, 2023 10:44 AM) Patient has not started - worried about side effects  insulin  aspart (NOVOLOG  FLEXPEN) 100 UNIT/ML FlexPen [id number] Yes Max daily 45 units  Patient taking differently: 10-13 Units 3 (three) times daily with meals.   Shamleffer, Donell Cardinal, MD  Active            Med Note [facility], [place] B   Mon May 25, 2023 11:15 AM) Usually 2 meals per day  insulin  glargine, 2 Unit Dial , (TOUJEO  MAX SOLOSTAR) 300 UNIT/ML Solostar Pen [id number] Yes Inject 16 Units into the skin daily at 6 (six) AM. Shamleffer, Donell Cardinal, MD  Active   Insulin  Pen Needle (PEN NEEDLES) 32G X 4 MM MISC [id number]  1 Device by Does not apply route in the morning, at noon, in the evening, and at bedtime. Shamleffer, Ibtehal Jaralla, MD  Active   loratadine (CLARITIN) 10 MG tablet [id number]  Take 10 mg by mouth daily as needed (seasonal allergies). [provider]  Active Self  losartan  (COZAAR ) 50 MG tablet [id number] Yes Take 1 tablet (50 mg total) by mouth daily. Jason Leita Repine, FNP  Active   meclizine  (ANTIVERT ) 12.5 MG tablet [id number]  Take 1 tablet (12.5 mg total) by mouth 3 (three) times  daily as needed for dizziness. Jason Leita Repine, FNP  Active   Polyethyl Glycol-Propyl Glycol (SYSTANE FREE OP) [id number]  Apply 1 drop to eye as needed. [provider]  Active   Polyvinyl Alcohol-Povidone (REFRESH OP) [id number]  Apply to eye. [provider]  Active   REPATHA  SURECLICK 140 MG/ML EMMANUEL [id number] Yes ADMINISTER 1 ML UNDER THE SKIN EVERY 14 DAYS O'Neal, Darryle Ned, MD  Active   Semaglutide , 2 MG/DOSE, 8 MG/3ML SOPN [id number] Yes Inject 2 mg as directed once a week. 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 04/21/2022   Primary osteoarthritis of right knee 03/31/2022   Type 2 diabetes mellitus with stage 4 chronic kidney disease, with long-term current use of insulin  (HCC) 07/02/2021   Arthritis of hip 04/18/2021   Background diabetic retinopathy (HCC) 04/18/2021   Chronic kidney disease, stage 3b (HCC) 04/18/2021   Dyslipidemia 04/18/2021   Family history of malignant neoplasm of digestive organs 04/18/2021   Gouty arthritis 04/18/2021   Hearing loss 04/18/2021   Hyperglycemia due to type 2 diabetes mellitus (HCC) 04/18/2021   Long term (current) use of insulin  (HCC) 04/18/2021   Obstructive sleep apnea (adult) (pediatric) 04/18/2021   Peripheral arterial disease (HCC) 04/18/2021   History of colonic polyps 04/18/2021   Flank pain 10/14/2019   Type 2 diabetes mellitus with diabetic polyneuropathy, with long-term current use of insulin  (HCC) 12/01/2018   Type 2 diabetes mellitus with retinopathy, with long-term current use of insulin  (HCC) 12/01/2018   Type 2 diabetes mellitus with stage 3b chronic kidney disease, with long-term current use of insulin  (HCC) 11/30/2018  Non-intractable vomiting    Acute hepatitis 07/13/2018   Acute on chronic renal insufficiency 07/12/2018   Diabetes  mellitus (HCC) 12/08/2016   Essential hypertension 12/08/2016   Hyperlipidemia associated with type 2 diabetes mellitus (HCC) 12/08/2016     Medication Assistance:  Re-Enrolled for [employer] Hyperlipidemia  - grant approved ($2500 - from 12/17/2022 thru 12/16/2023).      Assessment / Plan: Diabetes: Last A1c was at goal of < 7.5%  Continue Ozempic  2mg  weekly  Continue Tresiba 16 units daily.  Reviewed Novolog  regimen  Continue to follow up with Dr Sam.  Continue to use Libre 3 sensors to check blood glucose 3 or more times per day.  Reviewed with patient how to interpret trend arrows. She will continue to check blood glucose today every 5 minutes. If blood glucose continues to decrease to < 100 she should eat a small snack to prevent hypoglycemia.  Encouraged increase physical activity  Discussed dietary changes - suggested she look for a no sugar added cream option. Also recommended she limit intake of juice - is she does drink juice limit to 4 ounce serving, choose low sugar options and / or mix 4 ounces of juice with 4 ounces of water / tonic water.   Hyperlipidemia / PAD / bilateral artery stenosis: LDL goal per cardiology office < 55 Continue Repatha  140mg  every 14 days and ezetimibe  10mg  daily.   If LDL still > 55 when rechecked in 1 to 2 months, consider adding Nexlitol or change to different PCSK9.   Continue to use [employer] Grant - valid thru 12/2023   Hypertension/CKD:  Continue amlodipine  10mg  daily and losartan  50mg  daily. Check blood pressure 2 to 3 times per week.  Recheck urine microalbumin - no SGLT2 at this time due to concerns with possible UTIs.   Medication management:  Reviewed and updated medication list Reviewed refill history and adherence  Neuropathy:  Continue to use capsaicin cream  Follow Up:  4 to 6 weeks.    Madelin Ray, PharmD Clinical Pharmacist [facility]  - [facility]

## 2023-09-14 NOTE — Progress Notes (Unsigned)
 [facility] [place]  ASSESSMENT / PLAN: Hayley Case is a 73 y.o. female with asymptomatic bilateral carotid artery stenosis (mild on R; 50-69% on L).   Recommend:  Abstinence from all tobacco products. Blood glucose control with goal A1c < 7%. Blood pressure control with goal blood pressure < 140/90 mmHg. Lipid reduction therapy with goal LDL-C <100 mg/dL Aspirin 81mg  PO QD.  Atorvastatin  40-80mg  PO QD (or other high intensity statin therapy).  Follow up with me in 12 months with repeat carotid duplex.   CHIEF COMPLAINT: carotid artery stenosis  HISTORY OF PRESENT ILLNESS: Hayley Case is a 73 y.o. female with vertiginous dizziness and hearing loss. Carotid duplex was ordered which revealed carotid stenosis.  The patient is asymptomatic from a carotid artery standpoint.  She has no unilateral weakness, numbness, facial droop, difficulty with vision, difficulty speaking.  We reviewed her duplex findings in detail.  We reviewed the natural history of carotid artery stenosis and its risk for causing stroke.  Reviewed the rationale for asymptomatic intervention at 80% stenosis.  We reviewed the rationale for symptomatic intervention at greater than 50% stenosis.  Past Medical History:  Diagnosis Date   Allergy    Anxiety    Arthritis    Asthma    Cataract    CKD (chronic kidney disease)    COPD (chronic obstructive pulmonary disease) (HCC)    Depression    Diabetes mellitus without complication (HCC)    GERD (gastroesophageal reflux disease)    Hyperlipidemia    Hypertension    Sleep apnea     Past Surgical History:  Procedure Laterality Date   BREAST BIOPSY     BREAST EXCISIONAL BIOPSY Left    EYE SURGERY     TUBAL LIGATION      Family History  Problem Relation Age of Onset   Skin cancer Mother    Heart attack Father    Stroke Father    Colon cancer Sister    Arthritis Sister    Cancer Sister    Anxiety disorder Daughter    Asthma  Daughter    Varicose Veins Daughter    Depression Son    Diabetes Son    Hearing loss Son    Hypertension Son    Kidney disease Son    Obesity Son    Early death Son    Hypertension Son    Anxiety disorder Daughter    Asthma Daughter    Varicose Veins Daughter    Depression Son    Diabetes Son    Hearing loss Son    Hypertension Son    Kidney disease Son    Obesity Son    Early death Son    Hypertension Son     Social History   Socioeconomic History   Marital status: Widowed    Spouse name: Not on file   Number of children: Not on file   Years of education: Not on file   Highest education level: Some college, no degree  Occupational History   Not on file  Tobacco Use   Smoking status: Never   Smokeless tobacco: Never  Vaping Use   Vaping status: Never Used  Substance and Sexual Activity   Alcohol use: No   Drug use: No   Sexual activity: Not Currently  Other Topics Concern   Not on file  Social History Narrative   Not on file   Social Drivers of Health   Financial Resource Strain: Low  Risk  (05/26/2023)   Overall Financial Resource Strain (CARDIA)    Difficulty of Paying Living Expenses: Not very hard  Food Insecurity: Food Insecurity Present (05/26/2023)   Hunger Vital Sign    Worried About Running Out of Food in the Last Year: Sometimes true    Ran Out of Food in the Last Year: Sometimes true  Transportation Needs: No Transportation Needs (05/26/2023)   PRAPARE - Administrator, Civil Service (Medical): No    Lack of Transportation (Non-Medical): No  Physical Activity: Insufficiently Active (05/26/2023)   Exercise Vital Sign    Days of Exercise per Week: 1 day    Minutes of Exercise per Session: 30 min  Stress: Stress Concern Present (05/26/2023)   [name] of Occupational Health - Occupational Stress Questionnaire    Feeling of Stress : To some extent  Social Connections: Moderately Integrated (05/26/2023)   Social Connection and  Isolation Panel    Frequency of Communication with Friends and Family: More than three times a week    Frequency of Social Gatherings with Friends and Family: Never    Attends Religious Services: More than 4 times per year    Active Member of [employer] or Organizations: Yes    Attends Banker Meetings: More than 4 times per year    Marital Status: Widowed  Intimate Partner Violence: Not At Risk (05/26/2023)   Humiliation, Afraid, Rape, and Kick questionnaire    Fear of Current or Ex-Partner: No    Emotionally Abused: No    Physically Abused: No    Sexually Abused: No    Allergies  Allergen Reactions   Lisinopril Cough   Atorvastatin  Other (See Comments)    Liver function test elevation Other reaction(s): liver issues   Penicillins Hives and Itching    Did it involve swelling of the face/tongue/throat, SOB, or low BP? No Did it involve sudden or severe rash/hives, skin peeling, or any reaction on the inside of your mouth or nose? Yes Did you need to seek medical attention at a hospital or doctor's office? Yes When did it last happen?      2012 If all above answers are NO, may proceed with cephalosporin use.   Rosuvastatin  Other (See Comments)    Myalgias / leg cramps   Sulfamethoxazole-Trimethoprim Hives    Other reaction(s): Unknown   Penicillin G     Other reaction(s): Unknown    Current Outpatient Medications  Medication Sig Dispense Refill   ACETAMINOPHEN  PO Take 650 mg by mouth every 6 (six) hours as needed for headache or moderate pain.     albuterol  (VENTOLIN  HFA) 108 (90 Base) MCG/ACT inhaler Inhale 2 puffs into the lungs every 6 (six) hours as needed for wheezing or shortness of breath. 1 g 3   allopurinol  (ZYLOPRIM ) 100 MG tablet TAKE 1 TABLET BY MOUTH EVERY DAY 90 tablet 3   amLODipine  (NORVASC ) 10 MG tablet Take 1 tablet (10 mg total) by mouth daily. 90 tablet 1   Cholecalciferol (VITAMIN D ) 50 MCG (2000 UT) tablet Take 2,000 Units by mouth daily.      Continuous Glucose Sensor (FREESTYLE LIBRE 3 SENSOR) MISC 1 Device by Does not apply route daily in the afternoon. Place 1 sensor on the skin every 14 days. Use to check glucose continuously 6 each 3   Cyanocobalamin (VITAMIN B-12 PO) Take 3,000 mcg by mouth daily.     Docusate Sodium  (COLACE PO) Take 1 capsule by mouth daily.  ezetimibe  (ZETIA ) 10 MG tablet Take 1 tablet (10 mg total) by mouth daily. 90 tablet 1   gabapentin (NEURONTIN) 300 MG capsule Take 300 mg by mouth 3 (three) times daily. (Patient not taking: Reported on 09/01/2023)     insulin  aspart (NOVOLOG  FLEXPEN) 100 UNIT/ML FlexPen Max daily 45 units (Patient taking differently: 10-13 Units 3 (three) times daily with meals.) 45 mL 3   insulin  glargine, 2 Unit Dial , (TOUJEO  MAX SOLOSTAR) 300 UNIT/ML Solostar Pen Inject 16 Units into the skin daily at 6 (six) AM. 6 mL 1   Insulin  Pen Needle (PEN NEEDLES) 32G X 4 MM MISC 1 Device by Does not apply route in the morning, at noon, in the evening, and at bedtime. 400 each 3   loratadine (CLARITIN) 10 MG tablet Take 10 mg by mouth daily as needed (seasonal allergies).     losartan  (COZAAR ) 50 MG tablet Take 1 tablet (50 mg total) by mouth daily. 90 tablet 0   meclizine  (ANTIVERT ) 12.5 MG tablet Take 1 tablet (12.5 mg total) by mouth 3 (three) times daily as needed for dizziness. 30 tablet 0   Polyethyl Glycol-Propyl Glycol (SYSTANE FREE OP) Apply 1 drop to eye as needed.     Polyvinyl Alcohol-Povidone (REFRESH OP) Apply to eye.     REPATHA  SURECLICK 140 MG/ML SOAJ ADMINISTER 1 ML UNDER THE SKIN EVERY 14 DAYS 6 mL 3   Semaglutide , 2 MG/DOSE, 8 MG/3ML SOPN Inject 2 mg as directed once a week. 9 mL 3   No current facility-administered medications for this visit.    PHYSICAL EXAM There were no vitals filed for this visit.   Well-appearing elderly woman in no acute distress Regular rate and rhythm Unlabored breathing Moves all 4 extremities to command No focal neurologic  signs   PERTINENT LABORATORY AND RADIOLOGIC DATA  Most recent CBC    Latest Ref Rng & Units 08/12/2023    3:34 PM 02/17/2023   12:17 PM 09/18/2020    9:53 AM  CBC  WBC 3.8 - 10.8 Thousand/uL 8.5  7.7  9.4   Hemoglobin 11.7 - 15.5 g/dL 87.4  87.6  88.2   Hematocrit 35.0 - 45.0 % 38.4  36.1  34.2   Platelets 140 - 400 Thousand/uL 330  324.0  329.0      Most recent CMP    Latest Ref Rng & Units 08/12/2023    3:33 PM 05/01/2023    9:35 AM 10/07/2022    7:39 AM  CMP  Glucose 65 - 99 mg/dL 851  873  845   BUN 7 - 25 mg/dL 38  28  35   Creatinine 0.60 - 1.00 mg/dL 7.54  7.94  7.73   Sodium 135 - 146 mmol/L 141  143  141   Potassium 3.5 - 5.3 mmol/L 4.8  4.4  4.2   Chloride 98 - 110 mmol/L 105  107  109   CO2 20 - 32 mmol/L 24  21  24    Calcium  8.6 - 10.4 mg/dL 89.9  9.7  9.3   Total Protein 6.1 - 8.1 g/dL 7.2  6.8    Total Bilirubin 0.2 - 1.2 mg/dL 0.6  0.5    Alkaline Phos 44 - 121 IU/L  120    AST 10 - 35 U/L 14  13    ALT 6 - 29 U/L 12  10      Renal function CrCl cannot be calculated (Patient's most recent lab result is older than the maximum  21 days allowed.).  Hemoglobin A1C (%)  Date Value  05/05/2023 7.3 (A)   Hgb A1c MFr Bld (%)  Date Value  05/18/2018 10.1 (H)    LDL Chol Calc (NIH)  Date Value Ref Range Status  05/01/2023 90 0 - 99 mg/dL Final   Direct LDL  Date Value Ref Range Status  10/07/2022 79.0 mg/dL Final    Comment:    Optimal:  <100 mg/dLNear or Above Optimal:  100-129 mg/dLBorderline High:  130-159 mg/dLHigh:  160-189 mg/dLVery High:  >190 mg/dL     Carotid duplex 3/71/75  Right:   Color duplex indicates moderate heterogeneous and calcified plaque, with no hemodynamically significant stenosis by duplex criteria in the extracranial cerebrovascular circulation.   Left:   Heterogeneous and partially calcified plaque at the left carotid bifurcation contributes to 50%-69% stenosis by established duplex criteria.   Heterogeneous thyroid   with bilateral nodules. Recommend dedicated duplex for further evaluation, as needed.  Debby SAILOR. Magda, MD [facility] Phone Number: [phone] 09/14/2023 5:05 PM   Total time spent on preparing this encounter including chart review, data review, collecting history, examining the patient, coordinating care for this new patient, 45 minutes.  Portions of this report may have been transcribed using voice recognition software.  Every effort has been made to ensure accuracy; however, inadvertent computerized transcription errors may still be present.

## 2023-09-15 ENCOUNTER — Ambulatory Visit: Attending: Vascular Surgery | Admitting: Vascular Surgery

## 2023-09-15 ENCOUNTER — Encounter: Payer: Self-pay | Admitting: Vascular Surgery

## 2023-09-15 ENCOUNTER — Ambulatory Visit (HOSPITAL_COMMUNITY)
Admission: RE | Admit: 2023-09-15 | Discharge: 2023-09-15 | Disposition: A | Discharge status: Home / Self Care | Source: Ambulatory Visit | Attending: Vascular Surgery | Admitting: Vascular Surgery

## 2023-09-15 VITALS — BP 130/79 | HR 95 | Temp 97.7°F | Ht 64.0 in | Wt 170.0 lb

## 2023-09-15 DIAGNOSIS — I6523 Occlusion and stenosis of bilateral carotid arteries: Secondary | ICD-10-CM

## 2023-09-16 ENCOUNTER — Other Ambulatory Visit: Payer: Self-pay | Admitting: Family

## 2023-09-17 ENCOUNTER — Ambulatory Visit (INDEPENDENT_AMBULATORY_CARE_PROVIDER_SITE_OTHER): Admitting: Psychology

## 2023-09-17 DIAGNOSIS — F4323 Adjustment disorder with mixed anxiety and depressed mood: Secondary | ICD-10-CM | POA: Diagnosis not present

## 2023-09-17 NOTE — Progress Notes (Signed)
 [facility] Counselor/Therapist Progress Note  Patient ID: FUSAE FLORIO, MRN: [id number],    Date: 09/17/2023  Time Spent: 11:00am-11:55am   55 minutes   Treatment Type: Individual Therapy  Reported Symptoms: stress, worry  Mental Status Exam: Appearance:  Casual     Behavior: Appropriate  Motor: Normal  Speech/Language:  Normal Rate  Affect: Appropriate  Mood: normal  Thought process: normal  Thought content:   WNL  Sensory/Perceptual disturbances:   WNL  Orientation: oriented to person, place, time/date, and situation  Attention: Good  Concentration: Good  Memory: WNL  Fund of knowledge:  Good  Insight:   Good  Judgment:  Good  Impulse Control: Good   Risk Assessment: Danger to Self:  No Self-injurious Behavior: No Danger to Others: No Duty to Warn:no Physical Aggression / Violence:No  Access to Firearms a concern: No  Gang Involvement:No   Subjective: Pt present for face-to-face individual therapy via video.  Pt consents to telehealth video session and is aware of limitations and benefits of virtual sessions.   Location of pt: home Location of therapist: home office.   Pt talked about her PCP leaving which is stressful.  She is scheduled with a new PCP tomorrow but is a little anxious about meeting a new doctor.    Pt talked about her son [name].  [name] was in the hospital for a few days which was worrisome for pt.  He was crying bc he does not like being alone at the hospital.  That really tugged at pt's heart.  She visited as much as she could but could not be there 24/7.   [name] can be demanding at times and asks pt to do things for him that he should be able to do himself.  Addressed the stress pt feels regarding care giving for [name].   [name] has been talking about being tired of being on dialysis and he can not be on the kidney transplant list until he loses weight.  At times pt feels down due to worrying about her son [name].  Addressed pt's  worries.  Pt talked about her son [name] and daughter [name].  They use to be close but are not anymore.  Pt has to be careful about what she shares about her kids with them.  Addressed how stressful that can be.   Pt talked about attending the [employer] services.  She is beginning to meet people and building a support community.  Worked on self care strategies. Provided supportive therapy.  Interventions: Cognitive Behavioral Therapy and Insight-Oriented  Diagnosis:  F43.23  Plan of Care: Recommend ongoing therapy.  Pt participated in setting treatment goals.   Pt wants to improve coping skills.  Plan to meet monthly.  Pt agrees with treatment plan.    Treatment Plan  (treatment plan target date:  11/18/2023) Client Abilities/Strengths  Pt is bright, engaging, and motivated for therapy.  Client Treatment Preferences  Individual therapy.  Client Statement of Needs  Improve copings skills and understand herself better. Improve self esteem.  Symptoms  Depressed or irritable mood. Excessive and/or unrealistic worry that is difficult to control occurring more days than not for at least 6 months about a number of events or activities. Hypervigilance (e.g., feeling constantly on edge, experiencing concentration difficulties, having trouble falling or staying asleep, exhibiting a general state of irritability). Low self-esteem. Problems Addressed  Unipolar Depression, Anxiety Goals 1. Alleviate depressive symptoms and return to previous level of effective functioning. 2. Appropriately grieve  the loss in order to normalize mood and to return to previously adaptive level of functioning. Objective Learn and implement behavioral strategies to overcome depression. Target Date: 2023-11-18 Frequency: Monthly  Progress: 50 Modality: individual  Related Interventions Assist the client in developing skills that increase the likelihood of deriving pleasure from behavioral activation (e.g.,  assertiveness skills, developing an exercise plan, less internal/more external focus, increased social involvement); reinforce success. Engage the client in behavioral activation, increasing his/her activity level and contact with sources of reward, while identifying processes that inhibit activation. use behavioral techniques such as instruction, rehearsal, role-playing, role reversal, as needed, to facilitate activity in the client's daily life; reinforce success. 3. Develop healthy interpersonal relationships that lead to the alleviation and help prevent the relapse of depression. 4. Develop healthy thinking patterns and beliefs about self, others, and the world that lead to the alleviation and help prevent the relapse of depression. 5. Enhance ability to effectively cope with the full variety of life's worries and anxieties. 6. Learn and implement coping skills that result in a reduction of anxiety and worry, and improved daily functioning. Objective Learn and implement problem-solving strategies for realistically addressing worries. Target Date: 2023-11-18 Frequency: Monthly  Progress: 50 Modality: individual  Related Interventions Assign the client a homework exercise in which he/she problem-solves a current problem (see Mastery of Your Anxiety and Worry: Workbook by Richarda and Jonne or Generalized Anxiety Disorder by Delores Filler, and Jonne); review, reinforce success, and provide corrective feedback toward improvement. Teach the client problem-solving strategies involving specifically defining a problem, generating options for addressing it, evaluating the pros and cons of each option, selecting and implementing an optional action, and reevaluating and refining the action. Objective Learn and implement calming skills to reduce overall anxiety and manage anxiety symptoms. Target Date: 2023-11-18 Frequency: Monthly  Progress: 50 Modality: individual  Related Interventions Assign the  client to read about progressive muscle relaxation and other calming strategies in relevant books or treatment manuals (e.g., Progressive Relaxation Training by Thornell armin Collier; Mastery of Your Anxiety and Worry: Workbook by Richarda armin Jonne). Assign the client homework each session in which he/she practices relaxation exercises daily, gradually applying them progressively from non-anxiety-provoking to anxiety-provoking situations; review and reinforce success while providing corrective feedback toward improvement. Teach the client calming/relaxation skills (e.g., applied relaxation, progressive muscle relaxation, cue controlled relaxation; mindful breathing; biofeedback) and how to discriminate better between relaxation and tension; teach the client how to apply these skills to his/her daily life. 7. Recognize, accept, and cope with feelings of depression. 8. Reduce overall frequency, intensity, and duration of the anxiety so that daily functioning is not impaired. 9. Resolve the core conflict that is the source of anxiety. 10. Stabilize anxiety level while increasing ability to function on a daily basis. Diagnosis F43.23 Conditions For Discharge Achievement of treatment goals and objectives   Veva Alma, LCSW

## 2023-09-18 ENCOUNTER — Encounter: Payer: Self-pay | Admitting: Family Medicine

## 2023-09-18 ENCOUNTER — Ambulatory Visit (INDEPENDENT_AMBULATORY_CARE_PROVIDER_SITE_OTHER): Admitting: Family Medicine

## 2023-09-18 ENCOUNTER — Other Ambulatory Visit (HOSPITAL_COMMUNITY)
Admission: RE | Admit: 2023-09-18 | Discharge: 2023-09-18 | Disposition: A | Discharge status: Home / Self Care | Source: Ambulatory Visit | Attending: Family Medicine | Admitting: Family Medicine

## 2023-09-18 VITALS — BP 120/68 | HR 91 | Temp 97.8°F | Resp 16 | Ht 64.0 in | Wt 171.4 lb

## 2023-09-18 DIAGNOSIS — R8290 Unspecified abnormal findings in urine: Secondary | ICD-10-CM

## 2023-09-18 DIAGNOSIS — R300 Dysuria: Secondary | ICD-10-CM | POA: Diagnosis not present

## 2023-09-18 DIAGNOSIS — N760 Acute vaginitis: Secondary | ICD-10-CM

## 2023-09-18 LAB — POC URINALSYSI DIPSTICK (AUTOMATED)
Bilirubin, UA: NEGATIVE
Blood, UA: NEGATIVE
Glucose, UA: NEGATIVE
Ketones, UA: NEGATIVE
Nitrite, UA: NEGATIVE
Protein, UA: POSITIVE — AB
Spec Grav, UA: 1.015
Urobilinogen, UA: 0.2 U/dL
pH, UA: 5

## 2023-09-18 MED ORDER — FLUCONAZOLE 150 MG PO TABS: 150.0000 mg | ORAL_TABLET | Freq: Every day | ORAL | 0 refills | Status: DC

## 2023-09-18 MED ORDER — NITROFURANTOIN MONOHYD MACRO 100 MG PO CAPS: 100.0000 mg | ORAL_CAPSULE | Freq: Two times a day (BID) | ORAL | 0 refills | Status: AC

## 2023-09-18 NOTE — Patient Instructions (Signed)
 Irritation of the Vagina (Vaginitis): What to Know  Vaginitis is irritation and swelling of the vagina. It happens when the usual balance of bacteria and yeast in the vagina changes. This change causes some types to grow too much. This overgrowth leads to vaginitis. What are the causes? Bacteria. Yeast, which is a fungus. A parasite. A virus. Low hormones in the body. This can occur during pregnancy, breastfeeding, or after menopause. What increases the risk? Irritants, such as douches, bubble baths, scented tampons, and feminine sprays. Antibiotics. Poor hygiene. Wearing tight pants or thong underwear. Some birth control methods, such as diaphragms, vaginal sponges, or spermicides. Having sex without a condom or having sex with more than one person. Infections. Uncontrolled diabetes. What are the signs or symptoms? Abnormal fluid from the vagina. The fluid may be: White, gray, or yellow. Thick, white, and cheesy. Frothy and yellow or green. A bad smell from the vagina. Itching, pain, or swelling in the vagina. Pain during sex. Pain or burning when you pee. How is this diagnosed? This condition is diagnosed based on your symptoms, medical history, and an exam. This may include a pelvic exam. Tests may also be done. Tests may be done to: Check the pH level of your vagina. Check the fluid in your vagina. How is this treated? Treatment will depend on what is causing your vaginitis. Treatment may include: Antibiotics. Antifungal medicines. Medicines to treat symptoms if you have a virus. Your sex partner should also be treated. Estrogen medicines. Medicines to treat allergies. The medicines may be pills or creams. Follow these instructions at home: Lifestyle Keep the area around your vagina clean and dry. Avoid using soap. Rinse the area with water. Until your health care provider says it's okay: Do not douche. Do not use tampons. Use pads, if needed. Do not have sex. Wipe  from front to back after going to the bathroom. When the provider says it's okay, practice safe sex. Use condoms. General instructions Take your medicines only as told. If you were given antibiotics, take them as told. Do not stop taking them even if you start to feel better. How is this prevented? Use mild, unscented products. Avoid the following products if they are scented: Sprays. Detergents. Tampons. Products for cleaning the vagina. Soaps or bubble baths. Let air reach your genital area. To do this: Wear cotton underwear. Do not wear underwear while you sleep. Do not wear tight pants and underwear or pantyhose without a cotton panel. Do not wear thong underwear. Take off any wet clothing, such as bathing suits, as soon as possible. Practice safe sex. Use condoms. Contact a health care provider if: You have pain in the belly or around the pelvis. You have a fever or chills. You have symptoms that last for more than 2-3 days. This information is not intended to replace advice given to you by your health care provider. Make sure you discuss any questions you have with your health care provider. Document Revised: 10/02/2022 Document Reviewed: 05/12/2022 Elsevier Patient Education  2024 [employer].

## 2023-09-18 NOTE — Progress Notes (Signed)
 Subjective:    Patient ID: Hayley Case, female    DOB: [birth year], 73 y.o.   MRN: [id number]  Chief Complaint  Patient presents with   vaginal burning    HPI Patient is in today for vaginal itching and burning.  Discussed the use of AI scribe software for clinical note transcription with the patient, who gave verbal consent to proceed.  History of Present Illness Hayley Case is a 73 year old female who presents with persistent itching and burning sensation during urination.  She has been experiencing ongoing itching and a burning sensation during urination, particularly when attempting to ensure her bladder is empty. She reports that her symptoms improved after seeing [facility] in July and receiving antibiotics for a urinary tract infection. However, the itching returned after completing the antibiotics, and she reports that the problem with itching started back after the antibiotics.  She has been using an over-the-counter spray from [facility], similar to Monistat, to manage the itching, which provides some relief. No discharge, back pain, fevers, or stomach pain are reported. Previously, she experienced pain, but it has since resolved.  Her medication history includes a known allergy to penicillin.    Past Medical History:  Diagnosis Date   Allergy    Anxiety    Arthritis    Asthma    Cataract    CKD (chronic kidney disease)    COPD (chronic obstructive pulmonary disease) (HCC)    Depression    Diabetes mellitus without complication (HCC)    GERD (gastroesophageal reflux disease)    Hyperlipidemia    Hypertension    Sleep apnea     Past Surgical History:  Procedure Laterality Date   BREAST BIOPSY     BREAST EXCISIONAL BIOPSY Left    EYE SURGERY     TUBAL LIGATION      Family History  Problem Relation Age of Onset   Skin cancer Mother    Heart attack Father    Stroke Father    Colon cancer Sister    Arthritis Sister    Cancer Sister    Anxiety disorder  Daughter    Asthma Daughter    Varicose Veins Daughter    Depression Son    Diabetes Son    Hearing loss Son    Hypertension Son    Kidney disease Son    Obesity Son    Early death Son    Hypertension Son    Anxiety disorder Daughter    Asthma Daughter    Varicose Veins Daughter    Depression Son    Diabetes Son    Hearing loss Son    Hypertension Son    Kidney disease Son    Obesity Son    Early death Son    Hypertension Son     Social History   Socioeconomic History   Marital status: Widowed    Spouse name: Not on file   Number of children: Not on file   Years of education: Not on file   Highest education level: Some college, no degree  Occupational History   Not on file  Tobacco Use   Smoking status: Never   Smokeless tobacco: Never  Vaping Use   Vaping status: Never Used  Substance and Sexual Activity   Alcohol use: No   Drug use: No   Sexual activity: Not Currently  Other Topics Concern   Not on file  Social History Narrative   Not on file   Social Drivers  of Health   Financial Resource Strain: Low Risk  (09/15/2023)   Overall Financial Resource Strain (CARDIA)    Difficulty of Paying Living Expenses: Not very hard  Food Insecurity: No Food Insecurity (09/15/2023)   Hunger Vital Sign    Worried About Running Out of Food in the Last Year: Never true    Ran Out of Food in the Last Year: Never true  Transportation Needs: No Transportation Needs (09/15/2023)   PRAPARE - Administrator, Civil Service (Medical): No    Lack of Transportation (Non-Medical): No  Physical Activity: Insufficiently Active (09/15/2023)   Exercise Vital Sign    Days of Exercise per Week: 2 days    Minutes of Exercise per Session: 10 min  Stress: Stress Concern Present (09/15/2023)   [name] of Occupational Health - Occupational Stress Questionnaire    Feeling of Stress: Rather much  Social Connections: Moderately Integrated (09/15/2023)   Social Connection and  Isolation Panel    Frequency of Communication with Friends and Family: More than three times a week    Frequency of Social Gatherings with Friends and Family: Not on file    Attends Religious Services: 1 to 4 times per year    Active Member of [employer] or Organizations: Yes    Attends Banker Meetings: 1 to 4 times per year    Marital Status: Widowed  Intimate Partner Violence: Not At Risk (05/26/2023)   Humiliation, Afraid, Rape, and Kick questionnaire    Fear of Current or Ex-Partner: No    Emotionally Abused: No    Physically Abused: No    Sexually Abused: No    Outpatient Medications Prior to Visit  Medication Sig Dispense Refill   ACETAMINOPHEN  PO Take 650 mg by mouth every 6 (six) hours as needed for headache or moderate pain.     albuterol  (VENTOLIN  HFA) 108 (90 Base) MCG/ACT inhaler Inhale 2 puffs into the lungs every 6 (six) hours as needed for wheezing or shortness of breath. 1 g 3   allopurinol  (ZYLOPRIM ) 100 MG tablet TAKE 1 TABLET BY MOUTH EVERY DAY 90 tablet 3   amLODipine  (NORVASC ) 10 MG tablet Take 1 tablet (10 mg total) by mouth daily. 90 tablet 1   Cholecalciferol (VITAMIN D ) 50 MCG (2000 UT) tablet Take 2,000 Units by mouth daily.     Continuous Glucose Sensor (FREESTYLE LIBRE 3 SENSOR) MISC 1 Device by Does not apply route daily in the afternoon. Place 1 sensor on the skin every 14 days. Use to check glucose continuously 6 each 3   Cyanocobalamin (VITAMIN B-12 PO) Take 3,000 mcg by mouth daily.     Docusate Sodium  (COLACE PO) Take 1 capsule by mouth daily.     insulin  aspart (NOVOLOG  FLEXPEN) 100 UNIT/ML FlexPen Max daily 45 units (Patient taking differently: 10-13 Units 3 (three) times daily with meals.) 45 mL 3   insulin  glargine, 2 Unit Dial , (TOUJEO  MAX SOLOSTAR) 300 UNIT/ML Solostar Pen Inject 16 Units into the skin daily at 6 (six) AM. 6 mL 1   Insulin  Pen Needle (PEN NEEDLES) 32G X 4 MM MISC 1 Device by Does not apply route in the morning, at noon, in  the evening, and at bedtime. 400 each 3   loratadine (CLARITIN) 10 MG tablet Take 10 mg by mouth daily as needed (seasonal allergies).     losartan  (COZAAR ) 50 MG tablet TAKE 1 TABLET BY MOUTH EVERY DAY 90 tablet 0   meclizine  (ANTIVERT ) 12.5 MG tablet  Take 1 tablet (12.5 mg total) by mouth 3 (three) times daily as needed for dizziness. 30 tablet 0   Polyethyl Glycol-Propyl Glycol (SYSTANE FREE OP) Apply 1 drop to eye as needed.     Polyvinyl Alcohol-Povidone (REFRESH OP) Apply to eye.     REPATHA  SURECLICK 140 MG/ML SOAJ ADMINISTER 1 ML UNDER THE SKIN EVERY 14 DAYS 6 mL 3   Semaglutide , 2 MG/DOSE, 8 MG/3ML SOPN Inject 2 mg as directed once a week. 9 mL 3   ezetimibe  (ZETIA ) 10 MG tablet Take 1 tablet (10 mg total) by mouth daily. (Patient not taking: Reported on 09/18/2023) 90 tablet 1   gabapentin (NEURONTIN) 300 MG capsule Take 300 mg by mouth 3 (three) times daily. (Patient not taking: Reported on 09/01/2023)     No facility-administered medications prior to visit.    Allergies  Allergen Reactions   Lisinopril Cough   Atorvastatin  Other (See Comments)    Liver function test elevation Other reaction(s): liver issues   Penicillins Hives and Itching    Did it involve swelling of the face/tongue/throat, SOB, or low BP? No Did it involve sudden or severe rash/hives, skin peeling, or any reaction on the inside of your mouth or nose? Yes Did you need to seek medical attention at a hospital or doctor's office? Yes When did it last happen?      2012 If all above answers are NO, may proceed with cephalosporin use.   Rosuvastatin  Other (See Comments)    Myalgias / leg cramps   Sulfamethoxazole-Trimethoprim Hives    Other reaction(s): Unknown   Penicillin G     Other reaction(s): Unknown    Review of Systems  Constitutional:  Negative for fever and malaise/fatigue.  HENT:  Negative for congestion.   Eyes:  Negative for blurred vision.  Respiratory:  Negative for cough and shortness of  breath.   Cardiovascular:  Negative for chest pain, palpitations and leg swelling.  Gastrointestinal:  Negative for vomiting.  Genitourinary:  Positive for dysuria. Negative for frequency.  Musculoskeletal:  Negative for back pain.  Skin:  Negative for rash.  Neurological:  Negative for loss of consciousness and headaches.       Objective:    Physical Exam Vitals and nursing note reviewed.  Constitutional:      General: She is not in acute distress.    Appearance: Normal appearance. She is well-developed.  HENT:     Head: Normocephalic and atraumatic.  Cardiovascular:     Rate and Rhythm: Normal rate and regular rhythm.  Pulmonary:     Effort: Pulmonary effort is normal.  Musculoskeletal:        General: Normal range of motion.     Cervical back: Normal range of motion and neck supple.  Skin:    General: Skin is warm and dry.  Neurological:     Mental Status: She is alert and oriented to person, place, and time.  Psychiatric:        Mood and Affect: Mood normal.        Behavior: Behavior normal.        Thought Content: Thought content normal.        Judgment: Judgment normal.     BP 120/68 (BP Location: Right Arm, Patient Position: Sitting, Cuff Size: Large)   Pulse 91   Temp 97.8 F (36.6 C) (Oral)   Resp 16   Ht 5' 4 (1.626 m)   Wt 171 lb 6.4 oz (77.7 kg)   SpO2 98%  BMI 29.42 kg/m  Wt Readings from Last 3 Encounters:  09/18/23 171 lb 6.4 oz (77.7 kg)  09/15/23 170 lb (77.1 kg)  05/26/23 177 lb (80.3 kg)    Diabetic Foot Exam - Simple   No data filed    Lab Results  Component Value Date   WBC 8.5 08/12/2023   HGB 12.5 08/12/2023   HCT 38.4 08/12/2023   PLT 330 08/12/2023   GLUCOSE 148 (H) 08/12/2023   CHOL 174 05/01/2023   TRIG 117 05/01/2023   HDL 63 05/01/2023   LDLDIRECT 79.0 10/07/2022   LDLCALC 90 05/01/2023   ALT 12 08/12/2023   AST 14 08/12/2023   NA 141 08/12/2023   K 4.8 08/12/2023   CL 105 08/12/2023   CREATININE 2.45 (H)  08/12/2023   BUN 38 (H) 08/12/2023   CO2 24 08/12/2023   TSH 2.63 09/18/2020   INR 1.0 07/12/2018   HGBA1C 7.3 (A) 05/05/2023    Lab Results  Component Value Date   TSH 2.63 09/18/2020   Lab Results  Component Value Date   WBC 8.5 08/12/2023   HGB 12.5 08/12/2023   HCT 38.4 08/12/2023   MCV 90.1 08/12/2023   PLT 330 08/12/2023   Lab Results  Component Value Date   NA 141 08/12/2023   K 4.8 08/12/2023   CO2 24 08/12/2023   GLUCOSE 148 (H) 08/12/2023   BUN 38 (H) 08/12/2023   CREATININE 2.45 (H) 08/12/2023   BILITOT 0.6 08/12/2023   ALKPHOS 120 05/01/2023   AST 14 08/12/2023   ALT 12 08/12/2023   PROT 7.2 08/12/2023   ALBUMIN 4.1 05/01/2023   CALCIUM  10.0 08/12/2023   ANIONGAP 10 07/15/2018   EGFR 25 (L) 05/01/2023   GFR 21.31 (L) 10/07/2022   Lab Results  Component Value Date   CHOL 174 05/01/2023   Lab Results  Component Value Date   HDL 63 05/01/2023   Lab Results  Component Value Date   LDLCALC 90 05/01/2023   Lab Results  Component Value Date   TRIG 117 05/01/2023   Lab Results  Component Value Date   CHOLHDL 2.8 05/01/2023   Lab Results  Component Value Date   HGBA1C 7.3 (A) 05/05/2023       Assessment & Plan:  Acute vaginitis -     Cervicovaginal ancillary only -     Fluconazole ; Take 1 tablet (150 mg total) by mouth daily. May repeat in 3 days if needed.  Dispense: 2 tablet; Refill: 0  Dysuria -     POCT Urinalysis Dipstick (Automated) -     Nitrofurantoin  Monohyd Macro; Take 1 capsule (100 mg total) by mouth 2 (two) times daily.  Dispense: 14 capsule; Refill: 0  Abnormal urine findings -     Urine Culture  Assessment and Plan Assessment & Plan Vulvovaginal itching and burning (acute vaginitis)   She experiences intermittent vulvovaginal itching and burning. Symptoms improved after initial antifungal treatment but have recurred, suggesting a possible yeast infection secondary to antibiotic use for a previous UTI. No discharge is  reported. Provide a self-swab for yeast testing. Instruct on proper swab technique and collection. Leave the swab in the designated bin for processing.  Dysuria   She has intermittent dysuria without fever, back pain, or abdominal pain. A previous UTI was treated with antibiotics. Symptoms suggest a possible recurrence or residual effects. Order a urinalysis to check for UTI. Instruct on urine sample collection. Leave the urine sample in the designated bin for processing.  Sinclair Arrazola R Lowne Chase, DO

## 2023-09-20 ENCOUNTER — Other Ambulatory Visit: Payer: Self-pay | Admitting: Family Medicine

## 2023-09-20 ENCOUNTER — Ambulatory Visit: Payer: Self-pay | Admitting: Family Medicine

## 2023-09-20 DIAGNOSIS — N390 Urinary tract infection, site not specified: Secondary | ICD-10-CM

## 2023-09-20 LAB — URINE CULTURE
MICRO NUMBER:: 16928815
SPECIMEN QUALITY:: ADEQUATE

## 2023-09-20 MED ORDER — NITROFURANTOIN MONOHYD MACRO 100 MG PO CAPS: 100.0000 mg | ORAL_CAPSULE | Freq: Two times a day (BID) | ORAL | 0 refills | Status: DC

## 2023-09-21 ENCOUNTER — Other Ambulatory Visit: Payer: Self-pay | Admitting: Family Medicine

## 2023-09-21 DIAGNOSIS — Z1231 Encounter for screening mammogram for malignant neoplasm of breast: Secondary | ICD-10-CM

## 2023-09-21 LAB — CERVICOVAGINAL ANCILLARY ONLY
Bacterial Vaginitis (gardnerella): NEGATIVE
Candida Glabrata: NEGATIVE
Candida Vaginitis: NEGATIVE
Chlamydia: NEGATIVE
Comment: NEGATIVE
Comment: NEGATIVE
Comment: NEGATIVE
Comment: NEGATIVE
Comment: NEGATIVE
Comment: NORMAL
Neisseria Gonorrhea: NEGATIVE
Trichomonas: NEGATIVE

## 2023-09-29 ENCOUNTER — Ambulatory Visit
Admission: RE | Admit: 2023-09-29 | Discharge: 2023-09-29 | Disposition: A | Discharge status: Home / Self Care | Source: Ambulatory Visit | Attending: Family Medicine | Admitting: Family Medicine

## 2023-09-29 DIAGNOSIS — Z1231 Encounter for screening mammogram for malignant neoplasm of breast: Secondary | ICD-10-CM

## 2023-10-01 LAB — OPHTHALMOLOGY REPORT-SCANNED

## 2023-10-06 ENCOUNTER — Other Ambulatory Visit: Payer: Self-pay | Admitting: Pharmacist

## 2023-10-06 DIAGNOSIS — N760 Acute vaginitis: Secondary | ICD-10-CM

## 2023-10-06 DIAGNOSIS — E113299 Type 2 diabetes mellitus with mild nonproliferative diabetic retinopathy without macular edema, unspecified eye: Secondary | ICD-10-CM

## 2023-10-06 DIAGNOSIS — E785 Hyperlipidemia, unspecified: Secondary | ICD-10-CM

## 2023-10-06 DIAGNOSIS — N1832 Chronic kidney disease, stage 3b: Secondary | ICD-10-CM

## 2023-10-06 MED ORDER — FLUCONAZOLE 150 MG PO TABS: 150.0000 mg | ORAL_TABLET | Freq: Once | ORAL | 0 refills | Status: AC

## 2023-10-06 NOTE — Progress Notes (Signed)
 Pharmacy Note  09/01/2023 Name: Hayley Case MRN: [id number] DOB: 26-Apr-1950  Subjective: Hayley Case is a 73 y.o. year old female who is a primary care patient of No primary care provider on file.. Clinical Pharmacist Practitioner referral was placed to assist with medication and hyperlipidemia management.    Engaged with patient by telephone for follow up visit today.  Patient reports today that she had been having some itching after taking antibiotic for UTI. She was given Rx for fluconazole  150mg  - take 1 tablet after antibiotic, may repeat in 3 days. However patient has misplaced the 2nd dose. She has been using Monistat spray with some relief of itching.   Diabetes:  Managed by endocrinology - Dr Sam. Last appointment was 05/05/2023, next appointment is 11/04/2023.  Current regimen:   - Ozempic  2 mg weekly  - Toujeo  to 16 units once daily - Novolog  10 units with each meal - but patient is using 10 to 13 units with each meal (usually 2 meals per day) - misses meal time Novolog  - CF: Novolog   (BG -130/30)  Eye exam is currently up to date. Last appt was 10/01/2023 with Dr Charmayne.   Wt Readings from Last 3 Encounters:  09/18/23 171 lb 6.4 oz (77.7 kg)  09/15/23 170 lb (77.1 kg)  05/26/23 177 lb (80.3 kg)   Uses Continuous Glucose Monitor - Libre 2 plus  CGM Documentation:  Days Worn: 14 (recommend 14 days) % Time CGM is active: 69% (goal >=70%) Average Glucose: 183 mg/dL Glucose Management Indicator: 7.7% Glucose Variability: 29.4% (goal <36%) Time in Range:  - Time above range >250: 11%% (typical goal: <5%) - Time above range 181-250: 37%% (typical goal <20%) - Time in range 70-180: 52% (typical goal >=70%) - Time below range 54-69: 0% (typical goal <4%) - Time below range: 0% (typical goal <1%)         Neuropathy - Her podiatrist prescribed gabapentin 300mg  3 times a day but she has not started yet because she is concerned about side effect. She  feels she needs to be alert to help her son.  She is using capsaicin cream and she feel that it is helping some.    Hyperlipidemia / bilateral carotid artery stenosis / PAD: LDL goal per cardiology office < 55.   Current therapy: Repatha  140mg  every 14 days (every other Sunday) and ezetimibe  10mg  daily   Past therapies: Atorvastatin  caused increased LFTs requiring hospitalization. Rosuvastatin  caused myalgias.  Praluent  150mg  every 14 days 06/17/2021 but stopped due to cost. Switched to Repatha  - gets assistance with medication copay cost with [employer] (good thru 12/16/2023)  Hypertension/CKD:  Last office blood pressure was above goal of < 130/80 (CKD) but previous BPs were at goal.  Last nephrology appointment - 05/2023 with Dr Dolan. Per patient he recommended she lower dose of vitamin D  from 5000 units daily to 2000 units daily. She is trying to use up the Vitamin D  5000 unit tablets so she is taking 5000 units every other day.   BP Readings from Last 3 Encounters:  09/18/23 120/68  09/15/23 130/79  08/12/23 (!) 148/85    Vitamin D  = 75.1 (05/27/2023) PTH = 53 (05/27/2023)  Current hypertension therapy: losartan  50mg  daily and amlodipine  10mg  daily  Not on SGLT2 due to concerns about possible UTIs.    BP Readings from Last 3 Encounters:  09/18/23 120/68  09/15/23 130/79  08/12/23 (!) 148/85    Objective: Review of patient status, including  review of consultants reports, laboratory and other test data, was performed as part of comprehensive.  Lab Results  Component Value Date   CREATININE 2.45 (H) 08/12/2023   CREATININE 2.05 (H) 05/01/2023   CREATININE 2.26 (H) 10/07/2022    Lab Results  Component Value Date   HGBA1C 7.3 (A) 05/05/2023       Component Value Date/Time   CHOL 174 05/01/2023 0935   TRIG 117 05/01/2023 0935   HDL 63 05/01/2023 0935   CHOLHDL 2.8 05/01/2023 0935   CHOLHDL 3 10/07/2022 0739   VLDL 22.0 10/07/2022 0739   LDLCALC 90  05/01/2023 0935   LDLDIRECT 79.0 10/07/2022 0739    Clinical ASCVD: Yes  The 10-year ASCVD risk score (Arnett DK, et al., 2019) is: 24%   Values used to calculate the score:     Age: 63 years     Clincally relevant sex: Female     Is Non-Hispanic African American: Yes     Diabetic: Yes     Tobacco smoker: No     Systolic Blood Pressure: 120 mmHg     Is BP treated: Yes     HDL Cholesterol: 63 mg/dL     Total Cholesterol: 174 mg/dL      Allergies  Allergen Reactions   Lisinopril Cough   Atorvastatin  Other (See Comments)    Liver function test elevation Other reaction(s): liver issues   Penicillins Hives and Itching    Did it involve swelling of the face/tongue/throat, SOB, or low BP? No Did it involve sudden or severe rash/hives, skin peeling, or any reaction on the inside of your mouth or nose? Yes Did you need to seek medical attention at a hospital or doctor's office? Yes When did it last happen?      2012 If all above answers are NO, may proceed with cephalosporin use.   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 04/21/2022   Primary osteoarthritis of right knee 03/31/2022   Type 2 diabetes mellitus with stage 4 chronic kidney disease, with long-term current use of insulin  (HCC) 07/02/2021   Arthritis of hip 04/18/2021   Background diabetic retinopathy (HCC) 04/18/2021   Chronic kidney disease, stage 3b (HCC) 04/18/2021   Dyslipidemia 04/18/2021   Family history of malignant neoplasm of digestive organs 04/18/2021   Gouty arthritis 04/18/2021   Hearing loss 04/18/2021   Hyperglycemia due to type 2 diabetes mellitus (HCC) 04/18/2021   Long term  (current) use of insulin  (HCC) 04/18/2021   Obstructive sleep apnea (adult) (pediatric) 04/18/2021   Peripheral arterial disease 04/18/2021   History of colonic polyps 04/18/2021   Flank pain 10/14/2019   Type 2 diabetes mellitus with diabetic polyneuropathy, with long-term current use of insulin  (HCC) 12/01/2018   Type 2 diabetes mellitus with retinopathy, with long-term current use of insulin  (HCC) 12/01/2018   Type 2 diabetes mellitus with stage 3b chronic kidney disease, with long-term current use of insulin  (HCC) 11/30/2018   Non-intractable vomiting    Acute hepatitis 07/13/2018   Acute on chronic renal insufficiency 07/12/2018   Diabetes mellitus (HCC) 12/08/2016   Essential hypertension 12/08/2016   Hyperlipidemia associated with  type 2 diabetes mellitus (HCC) 12/08/2016     Medication Assistance:  Re-Enrolled for [employer] Hyperlipidemia  - grant approved ($2500 - from 12/17/2022 thru 12/16/2023).      Assessment / Plan: Diabetes: Last A1c was at goal of < 7.5%  Continue Ozempic  2mg  weekly  Continue Tresiba 16 units daily.  Reviewed Novolog  regimen and discuss importance of taking prior to meals to best control blood glucose post prandially. Discussed alternative insulin  delivery systems she could consider like V-GO or insulin  pump that might make administration of meal time insulin  more convenient.  Recommended she should discuss with her endocrinologist.  Continue to follow up with Dr Sam.  Continue to use Libre 3 sensors to check blood glucose 3 or more times per day.  Reviewed with patient how to interpret trend arrows. She will continue to check blood glucose today every 5 minutes. If blood glucose continues to decrease to < 100 she should eat a small snack to prevent hypoglycemia.  Encouraged increase physical activity  Requesting eye exam from Dr Charmayne office. They are faxing.  Hyperlipidemia / PAD / bilateral artery stenosis: LDL goal per cardiology office <  55 Continue Repatha  140mg  every 14 days and ezetimibe  10mg  daily.   If LDL still > 55 when rechecked in 1 to 2 months, consider adding Nexlitol or change to different PCSK9.   Continue to use [employer] Grant - valid thru 12/2023   Hypertension/CKD:  Continue amlodipine  10mg  daily and losartan  50mg  daily. Check blood pressure 2 to 3 times per week.  Recheck urine microalbumin - no SGLT2 at this time due to concerns with possible UTIs.   Medication management:  Reviewed and updated medication list Reviewed refill history and adherence Will check with PCP about sending in Rx for #1 fluconazole  to replace lost dose.   Neuropathy:  Continue to use capsaicin cream  Follow Up:  6 to 8 weeks    Madelin Ray, PharmD Clinical Pharmacist [facility]  - [facility] [address]

## 2023-10-06 NOTE — Addendum Note (Signed)
 Addended by: CARLA MILLING B on: 10/06/2023 01:19 PM   Modules accepted: Orders

## 2023-10-15 ENCOUNTER — Ambulatory Visit (INDEPENDENT_AMBULATORY_CARE_PROVIDER_SITE_OTHER): Admitting: Psychology

## 2023-10-15 DIAGNOSIS — F4323 Adjustment disorder with mixed anxiety and depressed mood: Secondary | ICD-10-CM

## 2023-10-15 NOTE — Progress Notes (Signed)
 [facility] Counselor/Therapist Progress Note  Patient ID: MARIALIZ FERREBEE, MRN: [id number],    Date: 10/15/2023  Time Spent: 11:00am-11:55am   55 minutes   Treatment Type: Individual Therapy  Reported Symptoms: stress, worry  Mental Status Exam: Appearance:  Casual     Behavior: Appropriate  Motor: Normal  Speech/Language:  Normal Rate  Affect: Appropriate  Mood: normal  Thought process: normal  Thought content:   WNL  Sensory/Perceptual disturbances:   WNL  Orientation: oriented to person, place, time/date, and situation  Attention: Good  Concentration: Good  Memory: WNL  Fund of knowledge:  Good  Insight:   Good  Judgment:  Good  Impulse Control: Good   Risk Assessment: Danger to Self:  No Self-injurious Behavior: No Danger to Others: No Duty to Warn:no Physical Aggression / Violence:No  Access to Firearms a concern: No  Gang Involvement:No   Subjective: Pt present for face-to-face individual therapy via video.  Pt consents to telehealth video session and is aware of limitations and benefits of virtual sessions.   Location of pt: home Location of therapist: home office.   Pt talked about her sister being diagnosed with kidney cancer.  Pt's nephew had a heart attack. This brought up her grief about her son who passed away from a heart attack.  Helped pt process her feelings and grief.   Pt's family stays in touch with zoom calls and pt is noticing that a lot of family members are facing challenges.   Pt talked about her son [name].  Addressed the stress pt feels regarding care giving for [name].   Pt feels like [name] is at a low point and giving up.  This upsets pt.  At times pt feels down due to worrying about her son [name].  Addressed pt's worries.  Pt talked about her health.  She has had a uti and is being treated for it.  She saw her new PCP and is adjusting to having a new PCP.  Pt talked about attending the [employer] services.  She is  beginning to meet people and building a support community.  Worked on self care strategies. Provided supportive therapy.  Interventions: Cognitive Behavioral Therapy and Insight-Oriented  Diagnosis:  F43.23  Plan of Care: Recommend ongoing therapy.  Pt participated in setting treatment goals.   Pt wants to improve coping skills.  Plan to meet monthly.  Pt agrees with treatment plan.    Treatment Plan  (treatment plan target date:  11/18/2023) Client Abilities/Strengths  Pt is bright, engaging, and motivated for therapy.  Client Treatment Preferences  Individual therapy.  Client Statement of Needs  Improve copings skills and understand herself better. Improve self esteem.  Symptoms  Depressed or irritable mood. Excessive and/or unrealistic worry that is difficult to control occurring more days than not for at least 6 months about a number of events or activities. Hypervigilance (e.g., feeling constantly on edge, experiencing concentration difficulties, having trouble falling or staying asleep, exhibiting a general state of irritability). Low self-esteem. Problems Addressed  Unipolar Depression, Anxiety Goals 1. Alleviate depressive symptoms and return to previous level of effective functioning. 2. Appropriately grieve the loss in order to normalize mood and to return to previously adaptive level of functioning. Objective Learn and implement behavioral strategies to overcome depression. Target Date: 2023-11-18 Frequency: Monthly  Progress: 50 Modality: individual  Related Interventions Assist the client in developing skills that increase the likelihood of deriving pleasure from behavioral activation (e.g., assertiveness skills, developing an exercise  plan, less internal/more external focus, increased social involvement); reinforce success. Engage the client in behavioral activation, increasing his/her activity level and contact with sources of reward, while identifying processes that  inhibit activation. use behavioral techniques such as instruction, rehearsal, role-playing, role reversal, as needed, to facilitate activity in the client's daily life; reinforce success. 3. Develop healthy interpersonal relationships that lead to the alleviation and help prevent the relapse of depression. 4. Develop healthy thinking patterns and beliefs about self, others, and the world that lead to the alleviation and help prevent the relapse of depression. 5. Enhance ability to effectively cope with the full variety of life's worries and anxieties. 6. Learn and implement coping skills that result in a reduction of anxiety and worry, and improved daily functioning. Objective Learn and implement problem-solving strategies for realistically addressing worries. Target Date: 2023-11-18 Frequency: Monthly  Progress: 50 Modality: individual  Related Interventions Assign the client a homework exercise in which he/she problem-solves a current problem (see Mastery of Your Anxiety and Worry: Workbook by Richarda and Jonne or Generalized Anxiety Disorder by Delores Filler, and Jonne); review, reinforce success, and provide corrective feedback toward improvement. Teach the client problem-solving strategies involving specifically defining a problem, generating options for addressing it, evaluating the pros and cons of each option, selecting and implementing an optional action, and reevaluating and refining the action. Objective Learn and implement calming skills to reduce overall anxiety and manage anxiety symptoms. Target Date: 2023-11-18 Frequency: Monthly  Progress: 50 Modality: individual  Related Interventions Assign the client to read about progressive muscle relaxation and other calming strategies in relevant books or treatment manuals (e.g., Progressive Relaxation Training by Thornell armin Collier; Mastery of Your Anxiety and Worry: Workbook by Richarda armin Jonne). Assign the client homework each  session in which he/she practices relaxation exercises daily, gradually applying them progressively from non-anxiety-provoking to anxiety-provoking situations; review and reinforce success while providing corrective feedback toward improvement. Teach the client calming/relaxation skills (e.g., applied relaxation, progressive muscle relaxation, cue controlled relaxation; mindful breathing; biofeedback) and how to discriminate better between relaxation and tension; teach the client how to apply these skills to his/her daily life. 7. Recognize, accept, and cope with feelings of depression. 8. Reduce overall frequency, intensity, and duration of the anxiety so that daily functioning is not impaired. 9. Resolve the core conflict that is the source of anxiety. 10. Stabilize anxiety level while increasing ability to function on a daily basis. Diagnosis F43.23 Conditions For Discharge Achievement of treatment goals and objectives   Veva Alma, LCSW

## 2023-10-24 ENCOUNTER — Other Ambulatory Visit: Payer: Self-pay | Admitting: Family Medicine

## 2023-10-24 DIAGNOSIS — I10 Essential (primary) hypertension: Secondary | ICD-10-CM

## 2023-11-04 ENCOUNTER — Encounter: Payer: Self-pay | Admitting: Internal Medicine

## 2023-11-04 ENCOUNTER — Ambulatory Visit: Admitting: Internal Medicine

## 2023-11-04 ENCOUNTER — Other Ambulatory Visit: Payer: Self-pay | Admitting: Family Medicine

## 2023-11-04 VITALS — BP 128/74 | HR 102 | Ht 64.0 in | Wt 168.0 lb

## 2023-11-04 DIAGNOSIS — N952 Postmenopausal atrophic vaginitis: Secondary | ICD-10-CM

## 2023-11-04 DIAGNOSIS — Z794 Long term (current) use of insulin: Secondary | ICD-10-CM

## 2023-11-04 DIAGNOSIS — E1142 Type 2 diabetes mellitus with diabetic polyneuropathy: Secondary | ICD-10-CM

## 2023-11-04 DIAGNOSIS — E1122 Type 2 diabetes mellitus with diabetic chronic kidney disease: Secondary | ICD-10-CM

## 2023-11-04 DIAGNOSIS — N184 Chronic kidney disease, stage 4 (severe): Secondary | ICD-10-CM

## 2023-11-04 DIAGNOSIS — E1159 Type 2 diabetes mellitus with other circulatory complications: Secondary | ICD-10-CM

## 2023-11-04 LAB — POCT GLYCOSYLATED HEMOGLOBIN (HGB A1C): Hemoglobin A1C: 7.2 % — AB (ref 4.0–5.6)

## 2023-11-04 MED ORDER — ESTRADIOL 0.01 % VA CREA: 1.0000 | TOPICAL_CREAM | VAGINAL | 12 refills | Status: AC

## 2023-11-04 NOTE — Progress Notes (Signed)
 Name: Hayley Case  Age/ Sex: 73 y.o., female   MRN/ DOB: [id number], 09-10-1950     PCP: Antonio Cyndee Jamee JONELLE, DO   Reason for Endocrinology Evaluation: Type 2 Diabetes Mellitus  Initial Endocrine Consultative Visit: 11/30/2018    PATIENT IDENTIFIER: Hayley Case is a 73 y.o. female with a past medical history of HTN, T2DM, PAD, coronary artery disease and Dyslipidemia . The patient has followed with Endocrinology clinic since 11/30/2018 for consultative assistance with management of her diabetes.  DIABETIC HISTORY:  [name] was diagnosed with T2DM > 20 yrs ago. She was on metformin, Trulicity , and Byetta. Has been on insulin  since ~ 2010.  Her hemoglobin A1c has ranged from 7.6 % in 2019, peaking at 10.7 % in 2020.     On her initial visit to our clinic, she had an A1c of 11.8%. She was on lantus /humalog  but was not taking regularly, switched to insulin  mix for ease of take.    Was on Lipitor but caused elevated LFT's requiring hospitalization.   Moved from [place] to help her son who is on peritoneal dialysis. Has lost another son to MI    By 06/2019 we attempted to put her on the V-Go but was cost prohibitive.   By 09/2019 we switched insulin  mix to MDi regimen due to persistent hyperglycemia   Switch Rybelsus  to Ozempic  06/2022  SUBJECTIVE:   During the last visit (05/05/2023): A1c 7.3%   Today (11/04/2023): Ms. [name] is here for a follow up on diabetes.  She checks her blood sugars multiple times daily,through CGM. The patient has had hypoglycemic episodes since the last clinic visit.        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, recently he was treated for pneumonia Patient has been noted with weight loss since her last visit here Has noted hair loss  Has mild  vomiting with cough , no nausea  Has chronic constipation  She has recurrent genital infections   She has chronic tingling of the feet, uses capsaicin cream    HOME DIABETES REGIMEN:  Ozempic  2 mg weekly Toujeo  16 units once daily Novolog  10 units with each meal CF: Humalog  (BG -130/30)      Statin: Off due to elevated LFT's and myalgias  ACE-I/ARB: Yes     CONTINUOUS GLUCOSE MONITORING RECORD INTERPRETATION    Dates of Recording: 10/9-10/22/2025  Sensor description:freestyle libre 3 +  Results statistics:   CGM use % of time 48  Average and SD 189/25.9  Time in range  48   %  % Time Above 180 40  % Time above 250 12  % Time Below target 0   Glycemic patterns summary: BGs down overnight and fluctuate during the day Hyperglycemic episodes postprandial  Hypoglycemic episodes occurred N/A  Overnight periods: Optimal      DIABETIC COMPLICATIONS: Microvascular complications:  CKD III, DR , neuropathy  Last eye exam: Completed 10/01/2023   Macrovascular complications:   PVD Denies: CAD, CVA   HISTORY:  Past Medical History:  Past Medical History:  Diagnosis Date   Allergy    Anxiety    Arthritis    Asthma    Cataract    CKD (chronic kidney disease)    COPD (chronic obstructive pulmonary disease) (HCC)    Depression  Diabetes mellitus without complication (HCC)    GERD (gastroesophageal reflux disease)    Hyperlipidemia    Hypertension    Sleep apnea    Past Surgical History:  Past Surgical History:  Procedure Laterality Date   BREAST BIOPSY     BREAST EXCISIONAL BIOPSY Left    EYE SURGERY     TUBAL LIGATION     Social History:  reports that she has never smoked. She has never used smokeless tobacco. She reports that she does not drink alcohol and does not use drugs. Family History:  Family History  Problem Relation Age of Onset   Skin cancer Mother    Heart attack Father    Stroke Father    Colon cancer Sister    Arthritis Sister     Cancer Sister    Anxiety disorder Daughter    Asthma Daughter    Varicose Veins Daughter    Anxiety disorder Daughter    Asthma Daughter    Varicose Veins Daughter    Depression Son    Diabetes Son    Hearing loss Son    Hypertension Son    Kidney disease Son    Obesity Son    Early death Son    Hypertension Son    Depression Son    Diabetes Son    Hearing loss Son    Hypertension Son    Kidney disease Son    Obesity Son    Early death Son    Hypertension Son    Breast cancer Neg Hx      HOME MEDICATIONS: Allergies as of 11/04/2023       Reactions   Lisinopril Cough   Atorvastatin  Other (See Comments)   Liver function test elevation Other reaction(s): liver issues   Penicillins Hives, Itching   Did it involve swelling of the face/tongue/throat, SOB, or low BP? No Did it involve sudden or severe rash/hives, skin peeling, or any reaction on the inside of your mouth or nose? Yes Did you need to seek medical attention at a hospital or doctor's office? Yes When did it last happen?      2012 If all above answers are NO, may proceed with cephalosporin use.   Rosuvastatin  Other (See Comments)   Myalgias / leg cramps   Sulfamethoxazole-trimethoprim Hives   Other reaction(s): Unknown   Penicillin G    Other reaction(s): Unknown        Medication List        Accurate as of November 04, 2023  8:44 AM. If you have any questions, ask your nurse or doctor.          ACETAMINOPHEN  PO Take 650 mg by mouth every 6 (six) hours as needed for headache or moderate pain.   albuterol  108 (90 Base) MCG/ACT inhaler Commonly known as: VENTOLIN  HFA Inhale 2 puffs into the lungs every 6 (six) hours as needed for wheezing or shortness of breath.   allopurinol  100 MG tablet Commonly known as: ZYLOPRIM  TAKE 1 TABLET BY MOUTH EVERY DAY   amLODipine  10 MG tablet Commonly known as: NORVASC  TAKE 1 TABLET BY MOUTH EVERY DAY   COLACE PO Take 1 capsule by mouth daily.    ezetimibe  10 MG tablet Commonly known as: ZETIA  Take 1 tablet (10 mg total) by mouth daily.   FreeStyle Libre 3 Sensor Misc 1 Device by Does not apply route daily in the afternoon. Place 1 sensor on the skin every 14 days. Use to check glucose continuously  loratadine 10 MG tablet Commonly known as: CLARITIN Take 10 mg by mouth daily as needed (seasonal allergies).   losartan  50 MG tablet Commonly known as: COZAAR  TAKE 1 TABLET BY MOUTH EVERY DAY   meclizine  12.5 MG tablet Commonly known as: ANTIVERT  Take 1 tablet (12.5 mg total) by mouth 3 (three) times daily as needed for dizziness.   nitrofurantoin  (macrocrystal-monohydrate) 100 MG capsule Commonly known as: Macrobid  Take 1 capsule (100 mg total) by mouth 2 (two) times daily.   NovoLOG  FlexPen 100 UNIT/ML FlexPen Generic drug: insulin  aspart Max daily 45 units   Pen Needles 32G X 4 MM Misc 1 Device by Does not apply route in the morning, at noon, in the evening, and at bedtime.   REFRESH OP Apply to eye.   Repatha  SureClick 140 MG/ML Soaj Generic drug: Evolocumab  ADMINISTER 1 ML UNDER THE SKIN EVERY 14 DAYS   Semaglutide  (2 MG/DOSE) 8 MG/3ML Sopn Inject 2 mg as directed once a week.   SYSTANE FREE OP Apply 1 drop to eye as needed.   Toujeo  Max SoloStar 300 UNIT/ML Solostar Pen Generic drug: insulin  glargine (2 Unit Dial ) Inject 16 Units into the skin daily at 6 (six) AM.   VITAMIN B-12 PO Take 3,000 mcg by mouth daily.   Vitamin D  50 MCG (2000 UT) tablet Take 2,000 Units by mouth daily.         OBJECTIVE:   Vital Signs: BP 128/74 (BP Location: Left Arm, Patient Position: Sitting, Cuff Size: Normal)   Pulse (!) 102   Ht 5' 4 (1.626 m)   Wt 168 lb (76.2 kg)   SpO2 99%   BMI 28.84 kg/m   Wt Readings from Last 3 Encounters:  11/04/23 168 lb (76.2 kg)  09/18/23 171 lb 6.4 oz (77.7 kg)  09/15/23 170 lb (77.1 kg)     Exam: General: Pt appears well and is in NAD  Lungs: Clear with good BS  bilat  Heart: RRR   Extremities: No  pretibial edema.  Neuro: MS is good with appropriate affect, pt is alert and Ox3       DM foot exam: 05/05/2023   The skin of the feet is intact without sores or ulcerations. The pedal pulses are  1+ b/l The sensation is decreased to a screening 5.07, 10 gram monofilament bilaterally     DATA REVIEWED:  Lab Results  Component Value Date   HGBA1C 7.3 (A) 05/05/2023   HGBA1C 7.3 (A) 12/29/2022   HGBA1C 7.3 (A) 06/27/2022     Latest Reference Range & Units 08/12/23 15:33  Sodium 135 - 146 mmol/L 141  Potassium 3.5 - 5.3 mmol/L 4.8  Chloride 98 - 110 mmol/L 105  CO2 20 - 32 mmol/L 24  Glucose 65 - 99 mg/dL 851 (H)  BUN 7 - 25 mg/dL 38 (H)  Creatinine 9.39 - 1.00 mg/dL 7.54 (H)  Calcium  8.6 - 10.4 mg/dL 89.9  BUN/Creatinine Ratio 6 - 22 (calc) 16  AG Ratio 1.0 - 2.5 (calc) 1.4  AST 10 - 35 U/L 14  ALT 6 - 29 U/L 12  Total Protein 6.1 - 8.1 g/dL 7.2  Total Bilirubin 0.2 - 1.2 mg/dL 0.6         Latest Reference Range & Units 05/01/23 09:35  Total CHOL/HDL Ratio 0.0 - 4.4 ratio 2.8  Cholesterol, Total 100 - 199 mg/dL 825  HDL Cholesterol >60 mg/dL 63  Triglycerides 0 - 850 mg/dL 882  VLDL Cholesterol Cal 5 - 40 mg/dL  21  LDL Chol Calc (NIH) 0 - 99 mg/dL 90  Globulin, Total 1.5 - 4.5 g/dL 2.7    ASSESSMENT / PLAN / RECOMMENDATIONS:   1) Type 2 Diabetes Mellitus, Optimally controlled, With CKD IV, retinopathy ,neuropathic  and macrovascular complications - Most recent A1c of 7.2%. Goal A1c < 7.5 %.    -A1c stable - We have attempted to put her on the V-go but that was cost prohibitive - She continues to have difficulty with NovoLog  intake at times due to busy schedule - No changes at this time - She was encouraged to avoid sugar sweetened beverages  MEDICATIONS:  - Continue Ozempic  2 mg weekly - Continue Toujeo  to 16 units once daily -Continue Novolog  10 units with each meal -CF: Novolog   (BG -130/30)     EDUCATION /  INSTRUCTIONS: BG monitoring instructions: Patient is instructed to check her blood sugars 3 times a day, before meals I reviewed the Rule of 15 for the treatment of hypoglycemia in detail with the patient. Literature supplied.   2) Peripheral neuropathy:  -Symptoms are tolerable at this time - Due to CKD Iv I would avoid gabapentin or Lyrica   3. Recurrent Vaginal irritation :  - Patient with recurrent genital/yeast infections and UTIs - She does have chronic vaginal irritation - I have recommended estradiol cream to start twice weekly   Medication  Start estradiol twice weekly  F/U in 6 months    Signed electronically by: Stefano Redgie Butts, MD  [facility] Endocrinology  [facility] [address] [place] [place], [place] [address] Phone: [phone] FAX: [phone]   CC: Antonio Cyndee Jamee JONELLE, DO [address] [place] [address] Phone: [phone]  Fax: [phone]  Return to Endocrinology clinic as below: Future Appointments  Date Time Provider Department Center  11/04/2023  9:10 AM Lynett Brasil, Donell Redgie, MD [facility] None  11/17/2023 10:00 AM Carla Milling, RPH-CPP [facility] None  11/19/2023 11:00 AM Bauert, Veva ORN, LCSW [facility] None  12/17/2023 11:00 AM Bauert, Veva ORN, LCSW [facility] None  05/31/2024 10:10 AM [facility] ANNUAL WELLNESS VISIT 2 [facility] [address]

## 2023-11-04 NOTE — Patient Instructions (Signed)
-   Continue Ozempic  2 mg, once weekly - Continue Toujeo  16 units once daily  - Continue  Novolog  10 units with each meal   -Novolog   correctional insulin : ADD extra units on insulin  to your meal-time Humalog  dose if your blood sugars are higher than 160. Use the scale below to help guide you:   Blood sugar before meal Number of units to inject  Less than 160 0 unit  161 -  190 1 units  191 -  220 2 units  221 -  250 3 units  251 -  280 4 units  281 -  310 5 units  311 -  340 6 units  341 -  370 7 units  371 -  400 8 units  401 - 430 9 units     HOW TO TREAT LOW BLOOD SUGARS (Blood sugar LESS THAN 70 MG/DL) Please follow the RULE OF 15 for the treatment of hypoglycemia treatment (when your (blood sugars are less than 70 mg/dL)   STEP 1: Take 15 grams of carbohydrates when your blood sugar is low, which includes:  3-4 GLUCOSE TABS  OR 3-4 OZ OF JUICE OR REGULAR SODA OR ONE TUBE OF GLUCOSE GEL    STEP 2: RECHECK blood sugar in 15 MINUTES STEP 3: If your blood sugar is still low at the 15 minute recheck --> then, go back to STEP 1 and treat AGAIN with another 15 grams of carbohydrates.

## 2023-11-11 LAB — COMPREHENSIVE METABOLIC PANEL WITH GFR
Albumin: 3.8 (ref 3.5–5.0)
Calcium: 9.6 (ref 8.7–10.7)
eGFR: 22

## 2023-11-11 LAB — BASIC METABOLIC PANEL WITH GFR
BUN: 33 — AB (ref 4–21)
CO2: 20 (ref 13–22)
Chloride: 106 (ref 99–108)
Creatinine: 2.3 — AB (ref 0.5–1.1)
Glucose: 141
Sodium: 139 (ref 137–147)

## 2023-11-11 LAB — VITAMIN D 25 HYDROXY (VIT D DEFICIENCY, FRACTURES): Vit D, 25-Hydroxy: 56.7

## 2023-11-17 ENCOUNTER — Other Ambulatory Visit: Admitting: Pharmacist

## 2023-11-19 ENCOUNTER — Ambulatory Visit (INDEPENDENT_AMBULATORY_CARE_PROVIDER_SITE_OTHER): Admitting: Psychology

## 2023-11-19 DIAGNOSIS — F4323 Adjustment disorder with mixed anxiety and depressed mood: Secondary | ICD-10-CM

## 2023-11-19 NOTE — Progress Notes (Signed)
 [facility] Counselor Initial Adult Exam  Name: Hayley Case Date: 11/19/2023 MRN: [id number] DOB: [birth year] PCP: Hayley Cyndee Jamee JONELLE, DO  Time spent: 11:00am - 11:55am    55 minutes  Guardian/Payee:  Hayley Case Six requested: No   Reason for Visit /Presenting Problem: Pt present for face-to-face initial assessment update via video.  Pt consents to telehealth video session and is aware of limitations and benefits of virtual sessions. Location of pt: home Location of therapist: home office.  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  He is on the kidney transplant list.   Pt lives with her son.  Her son expects to be babied by pt and does not do some things for himself that he should be able to do.  Addressed how pt can communicate with her son about these issues.  Pt gets overwhelmed. Worked on healthy boundary setting.  Pt has health issues herself.  It can be hard to be a care giver at 73 years old.   Pt gets fatigued at times.  Pt has had alot of loss in her life.  Her husband died in 12/07/2001.  One of her sons died in 2010-12-08.  Pt's sister and brother died the past couple of years.   Reviewed pt's  treatment plan for annual update.   Updated treatment plan and IA.    Pt participated in setting treatment goals.  Pt wants support and to improve coping skills.  Plan to continue to meet monthly.  Mental Status Exam: Appearance:   Casual     Behavior:  Appropriate  Motor:  Normal  Speech/Language:   Normal Rate  Affect:  Appropriate  Mood:  normal  Thought process:  normal  Thought content:    WNL  Sensory/Perceptual disturbances:    WNL  Orientation:  oriented to person, place, time/date, and situation  Attention:  Good  Concentration:  Good  Memory:  WNL  Fund of knowledge:   Good  Insight:    Good  Judgment:   Good  Impulse Control:  Good    Reported Symptoms:  stress, overwhelm  Risk Assessment: Danger to Self:   No Self-injurious Behavior: No Danger to Others: No Duty to Warn:no Physical Aggression / Violence:No  Access to Firearms a concern: No  Gang Involvement:No  Patient / guardian was educated about steps to take if suicide or homicide risk level increases between visits: n/a While future psychiatric events cannot be accurately predicted, the patient does not currently require acute inpatient psychiatric care and does not currently meet [place]  involuntary commitment criteria.  Substance Abuse History: Current substance abuse: No     Past Psychiatric History:   Previous psychological history is significant for depression Outpatient Providers:pt has been in therapy in the past History of Psych Hospitalization: No  Psychological Testing: n/a   Abuse History:  Victim of: No., n/a   Report needed: No. Victim of Neglect:No. Perpetrator of n/a  Witness / Exposure to Domestic Violence: No   Protective Services Involvement: No  Witness to [employer] Violence:  No   Family History:  Family History  Problem Relation Age of Onset   Skin cancer Mother    Heart attack Father    Stroke Father    Colon cancer Sister    Arthritis Sister    Cancer Sister    Anxiety disorder Daughter    Asthma Daughter    Varicose Veins  Daughter    Anxiety disorder Daughter    Asthma Daughter    Varicose Veins Daughter    Depression Son    Diabetes Son    Hearing loss Son    Hypertension Son    Kidney disease Son    Obesity Son    Early death Son    Hypertension Son    Depression Son    Diabetes Son    Hearing loss Son    Hypertension Son    Kidney disease Son    Obesity Son    Early death Son    Hypertension Son    Breast cancer Neg Hx     Living situation: the patient lives with her son.  Pt grew up with both parents and 15 siblings until she was 52 years old.  Pt is number 12 in the birth order.   Pt had asthma.  Pt was given to her aunt and uncle when she was 53 years old and was  raised as an only child in [place] .   She would visit her family in the summer.   Pt felt she was raised in a loving family.   Pt is known for being easy going and comical.   Family history of mental health issues:  none noted.   Sexual Orientation: Straight  Relationship Status: widowed  Name of spouse / other:n/a If a parent, number of children / ages:pt has two sons and one daughter.    Her other son passed away in 12-02-2010.  Support Systems: pt's son  Financial Stress:  No   Income/Employment/Disability: Neurosurgeon: No   Educational History: Education: Risk Manager: Protestant  Any cultural differences that may affect / interfere with treatment:  not applicable   Recreation/Hobbies: reading  Stressors: Other: pt is caregiver for her son who is on dialysis.     Strengths: Family, Spirituality, Hopefulness, Self Advocate, and Able to Communicate Effectively  Barriers:  none   Legal History: Pending legal issue / charges: The patient has no significant history of legal issues. History of legal issue / charges: n/a  Medical History/Surgical History: reviewed Past Medical History:  Diagnosis Date   Allergy    Anxiety    Arthritis    Asthma    Cataract    CKD (chronic kidney disease)    COPD (chronic obstructive pulmonary disease) (HCC)    Depression    Diabetes mellitus without complication (HCC)    GERD (gastroesophageal reflux disease)    Hyperlipidemia    Hypertension    Sleep apnea     Past Surgical History:  Procedure Laterality Date   BREAST BIOPSY     BREAST EXCISIONAL BIOPSY Left    EYE SURGERY     TUBAL LIGATION      Medications: Current Outpatient Medications  Medication Sig Dispense Refill   ACETAMINOPHEN  PO Take 650 mg by mouth every 6 (six) hours as needed for headache or moderate pain.     albuterol  (VENTOLIN  HFA) 108 (90 Base) MCG/ACT inhaler Inhale 2 puffs into the  lungs every 6 (six) hours as needed for wheezing or shortness of breath. 1 g 3   allopurinol  (ZYLOPRIM ) 100 MG tablet TAKE 1 TABLET BY MOUTH EVERY DAY 90 tablet 3   amLODipine  (NORVASC ) 10 MG tablet TAKE 1 TABLET BY MOUTH EVERY DAY 90 tablet 1   Cholecalciferol (VITAMIN D ) 50 MCG (2000 UT) tablet Take 2,000 Units by mouth daily.  Continuous Glucose Sensor (FREESTYLE LIBRE 3 SENSOR) MISC 1 Device by Does not apply route daily in the afternoon. Place 1 sensor on the skin every 14 days. Use to check glucose continuously 6 each 3   Cyanocobalamin (VITAMIN B-12 PO) Take 3,000 mcg by mouth daily.     Docusate Sodium  (COLACE PO) Take 1 capsule by mouth daily.     estradiol (ESTRACE) 0.01 % CREA vaginal cream Place 1 Applicatorful vaginally 3 (three) times a week. 42.5 g 12   ezetimibe  (ZETIA ) 10 MG tablet Take 1 tablet (10 mg total) by mouth daily. 90 tablet 1   insulin  aspart (NOVOLOG  FLEXPEN) 100 UNIT/ML FlexPen Max daily 45 units 45 mL 3   insulin  glargine, 2 Unit Dial , (TOUJEO  MAX SOLOSTAR) 300 UNIT/ML Solostar Pen Inject 16 Units into the skin daily at 6 (six) AM. 6 mL 1   Insulin  Pen Needle (PEN NEEDLES) 32G X 4 MM MISC 1 Device by Does not apply route in the morning, at noon, in the evening, and at bedtime. 400 each 3   loratadine (CLARITIN) 10 MG tablet Take 10 mg by mouth daily as needed (seasonal allergies).     losartan  (COZAAR ) 50 MG tablet TAKE 1 TABLET BY MOUTH EVERY DAY 90 tablet 0   meclizine  (ANTIVERT ) 12.5 MG tablet Take 1 tablet (12.5 mg total) by mouth 3 (three) times daily as needed for dizziness. 30 tablet 0   nitrofurantoin , macrocrystal-monohydrate, (MACROBID ) 100 MG capsule Take 1 capsule (100 mg total) by mouth 2 (two) times daily. 14 capsule 0   Polyethyl Glycol-Propyl Glycol (SYSTANE FREE OP) Apply 1 drop to eye as needed.     Polyvinyl Alcohol-Povidone (REFRESH OP) Apply to eye.     REPATHA  SURECLICK 140 MG/ML SOAJ ADMINISTER 1 ML UNDER THE SKIN EVERY 14 DAYS 6 mL 3    Semaglutide , 2 MG/DOSE, 8 MG/3ML SOPN Inject 2 mg as directed once a week. 9 mL 3   No current facility-administered medications for this visit.    Allergies  Allergen Reactions   Lisinopril Cough   Atorvastatin  Other (See Comments)    Liver function test elevation Other reaction(s): liver issues   Penicillins Hives and Itching    Did it involve swelling of the face/tongue/throat, SOB, or low BP? No Did it involve sudden or severe rash/hives, skin peeling, or any reaction on the inside of your mouth or nose? Yes Did you need to seek medical attention at a hospital or doctor's office? Yes When did it last happen?      2012 If all above answers are NO, may proceed with cephalosporin use.   Rosuvastatin  Other (See Comments)    Myalgias / leg cramps   Sulfamethoxazole-Trimethoprim Hives    Other reaction(s): Unknown   Penicillin G     Other reaction(s): Unknown    Diagnoses:  F43.23  Plan of Care: Recommend ongoing therapy.  Pt participated in setting treatment goals.   Pt wants to improve coping skills.  Plan to meet monthly.  Pt agrees with treatment plan.    Treatment Plan  (treatment plan target date:  11/18/2024) Client Abilities/Strengths  Pt is bright, engaging, and motivated for therapy.  Client Treatment Preferences  Individual therapy.  Client Statement of Needs  Improve copings skills. Symptoms  Depressed or irritable mood. Excessive and/or unrealistic worry that is difficult to control occurring more days than not for at least 6 months about a number of events or activities. Hypervigilance (e.g., feeling constantly on edge, experiencing concentration  difficulties, having trouble falling or staying asleep, exhibiting a general state of irritability). Low self-esteem. Problems Addressed  Unipolar Depression, Anxiety Goals 1. Alleviate depressive symptoms and return to previous level of effective functioning. 2. Appropriately grieve the loss in order to normalize  mood and to return to previously adaptive level of functioning. Objective Learn and implement behavioral strategies to overcome depression. Target Date: 2024-11-18 Frequency: Monthly  Progress: 55 Modality: individual  Related Interventions Assist the client in developing skills that increase the likelihood of deriving pleasure from behavioral activation (e.g., assertiveness skills, developing an exercise plan, less internal/more external focus, increased social involvement); reinforce success. Engage the client in behavioral activation, increasing his/her activity level and contact with sources of reward, while identifying processes that inhibit activation. use behavioral techniques such as instruction, rehearsal, role-playing, role reversal, as needed, to facilitate activity in the client's daily life; reinforce success. 3. Develop healthy interpersonal relationships that lead to the alleviation and help prevent the relapse of depression. 4. Develop healthy thinking patterns and beliefs about self, others, and the world that lead to the alleviation and help prevent the relapse of depression. 5. Enhance ability to effectively cope with the full variety of life's worries and anxieties. 6. Learn and implement coping skills that result in a reduction of anxiety and worry, and improved daily functioning. Objective Learn and implement problem-solving strategies for realistically addressing worries. Target Date: 2024-11-18 Frequency: Monthly  Progress: 55 Modality: individual  Related Interventions Assign the client a homework exercise in which he/she problem-solves a current problem (see Mastery of Your Anxiety and Worry: Workbook by Richarda and Jonne or Generalized Anxiety Disorder by Delores Filler, and Jonne); review, reinforce success, and provide corrective feedback toward improvement. Teach the client problem-solving strategies involving specifically defining a problem, generating options for  addressing it, evaluating the pros and cons of each option, selecting and implementing an optional action, and reevaluating and refining the action. Objective Learn and implement calming skills to reduce overall anxiety and manage anxiety symptoms. Target Date: 2024-11-18 Frequency: Monthly  Progress: 55 Modality: individual  Related Interventions Assign the client to read about progressive muscle relaxation and other calming strategies in relevant books or treatment manuals (e.g., Progressive Relaxation Training by Thornell armin Collier; Mastery of Your Anxiety and Worry: Workbook by Richarda armin Jonne). Assign the client homework each session in which he/she practices relaxation exercises daily, gradually applying them progressively from non-anxiety-provoking to anxiety-provoking situations; review and reinforce success while providing corrective feedback toward improvement. Teach the client calming/relaxation skills (e.g., applied relaxation, progressive muscle relaxation, cue controlled relaxation; mindful breathing; biofeedback) and how to discriminate better between relaxation and tension; teach the client how to apply these skills to his/her daily life. 7. Recognize, accept, and cope with feelings of depression. 8. Reduce overall frequency, intensity, and duration of the anxiety so that daily functioning is not impaired. 9. Resolve the core conflict that is the source of anxiety. 10. Stabilize anxiety level while increasing ability to function on a daily basis. Diagnosis F43.23 Conditions For Discharge Achievement of treatment goals and objectives    Veva Alma, LCSW

## 2023-11-20 ENCOUNTER — Ambulatory Visit (INDEPENDENT_AMBULATORY_CARE_PROVIDER_SITE_OTHER)

## 2023-11-20 ENCOUNTER — Other Ambulatory Visit: Payer: Self-pay | Admitting: Pharmacist

## 2023-11-20 DIAGNOSIS — Z23 Encounter for immunization: Secondary | ICD-10-CM

## 2023-11-20 NOTE — Progress Notes (Signed)
 Pharmacy Note  11/20/23 Name: Hayley Case MRN: [id number] DOB: [birth year]  Subjective: Hayley Case is a 73 y.o. year old female who is a primary care patient of Hayley Case, Hayley SAUNDERS, DO. Clinical Pharmacist Practitioner referral was placed to assist with medication and hyperlipidemia management.    Engaged with patient by telephone for follow up visit today.   Diabetes:  Managed by endocrinology - Hayley Case. Last appointment was 11/04/2023, next appointment is 05/05/2024.  Current regimen:   - Ozempic  2 mg weekly  - Toujeo  to 16 units once daily - Novolog  10 units with each meal - CF: Novolog   (BG -130/30)  Eye exam is currently up to date. Last appt was 10/01/2023 with Hayley Case.   Wt Readings from Last 3 Encounters:  11/04/23 168 lb (76.2 kg)  09/18/23 171 lb 6.4 oz (77.7 kg)  09/15/23 170 lb (77.1 kg)   Uses Continuous Glucose Monitor - Libre 2 plus. Recent missed wearing a few days due to her son being ill / sick.  CGM Documentation:  Days Worn: 14 (recommend 14 days) % Time CGM is active: 15% (goal >=70%) Average Glucose: 184 mg/dL Glucose Management Indicator: not enough info to calculate Glucose Variability: 29.3% (goal <36%) Time in Range:  - Time in very high range >250: 21% (typical goal: <5%) - Time in high range 181-250: 17% (typical goal <20%) - Time in range 70-180: 62% (typical goal >=70%) - Time below range 54-69: 0% (typical goal <4%) - Time below range: 0% (typical goal <1%)          Hyperlipidemia / bilateral carotid artery stenosis / PAD: LDL goal per cardiology office < 55.   Current therapy: Repatha  140mg  every 14 days (every other Sunday) and ezetimibe  10mg  daily   Past therapies: Atorvastatin  caused increased LFTs requiring hospitalization. Rosuvastatin  caused myalgias.  Praluent  150mg  every 14 days 06/17/2021 but stopped due to cost. Switched to Repatha  - gets assistance with medication copay cost with [employer]  (good thru 12/16/2023)  Hypertension/CKD:  Current therapy: amlodipine , losartan  Last nephrology appointment - 11/18/2023 with Hayley Case.    BP Readings from Last 3 Encounters:  11/04/23 128/74  09/18/23 120/68  09/15/23 130/79    Vitamin D  = 56.7 (11/11/2023) PTH = 57 (11/11/2023)   Current hypertension therapy: losartan  50mg  daily and amlodipine  10mg  daily  Not on SGLT2 due to concerns about possible UTIs.    BP Readings from Last 3 Encounters:  11/04/23 128/74  09/18/23 120/68  09/15/23 130/79    Objective: Review of patient status, including review of consultants reports, laboratory and other test data, was performed as part of comprehensive.  Lab Results  Component Value Date   CREATININE 2.45 (H) 08/12/2023   CREATININE 2.05 (H) 05/01/2023   CREATININE 2.26 (H) 10/07/2022    Lab Results  Component Value Date   HGBA1C 7.2 (A) 11/04/2023       Component Value Date/Time   CHOL 174 05/01/2023 0935   TRIG 117 05/01/2023 0935   HDL 63 05/01/2023 0935   CHOLHDL 2.8 05/01/2023 0935   CHOLHDL 3 10/07/2022 0739   VLDL 22.0 10/07/2022 0739   LDLCALC 90 05/01/2023 0935   LDLDIRECT 79.0 10/07/2022 0739    Clinical ASCVD: Yes  The 10-year ASCVD risk score (Arnett DK, et al., 2019) is: 26.5%   Values used to calculate the score:     Age: 57 years     Clincally relevant sex: Female  Is Non-Hispanic African American: Yes     Diabetic: Yes     Tobacco smoker: No     Systolic Blood Pressure: 128 mmHg     Is BP treated: Yes     HDL Cholesterol: 63 mg/dL     Total Cholesterol: 174 mg/dL      Allergies  Allergen Reactions   Lisinopril Cough   Atorvastatin  Other (See Comments)    Liver function test elevation Other reaction(s): liver issues   Penicillins Hives and Itching    Did it involve swelling of the face/tongue/throat, SOB, or low BP? No Did it involve sudden or severe rash/hives, skin peeling, or any reaction on the inside of your mouth or nose?  Yes Did you need to seek medical attention at a hospital or doctor's office? Yes When did it last happen?      2012 If all above answers are NO, may proceed with cephalosporin use.   Rosuvastatin  Other (See Comments)    Myalgias / leg cramps   Sulfamethoxazole-Trimethoprim Hives    Other reaction(s): Unknown   Penicillin G     Other reaction(s): Unknown    Medications Reviewed Today     Reviewed by Hayley Case, RPH-CPP (Pharmacist) on 11/20/23 at 0955  Med List Status: <None>   Medication Order Taking? Sig Documenting Provider Last Dose Status Informant  ACETAMINOPHEN  PO [id number]  Take 650 mg by mouth every 6 (six) hours as needed for headache or moderate pain. Provider, Historical, Case  Active Self  albuterol  (VENTOLIN  HFA) 108 (90 Base) MCG/ACT inhaler [id number]  Inhale 2 puffs into the lungs every 6 (six) hours as needed for wheezing or shortness of breath. Hayley Case  Active   allopurinol  (ZYLOPRIM ) 100 MG tablet [id number]  TAKE 1 TABLET BY MOUTH EVERY DAY Hayley Case  Active   amLODipine  (NORVASC ) 10 MG tablet [id number]  TAKE 1 TABLET BY MOUTH EVERY DAY Hayley Case  Active   Cholecalciferol (VITAMIN D ) 50 MCG (2000 UT) tablet [id number]  Take 2,000 Units by mouth daily. Provider, Historical, Case  Active   Continuous Glucose Sensor (FREESTYLE LIBRE 3 SENSOR) [place] [id number]  1 Device by Does not apply route daily in the afternoon. Place 1 sensor on the skin every 14 days. Use to check glucose continuously Hayley Case  Active            Med Note Hayley Case   Wed Nov 26, 2022 10:41 AM) Gets thru DME - [facility]  Cyanocobalamin (VITAMIN Case-12 PO) [id number]  Take 3,000 mcg by mouth daily. Provider, Historical, Case  Active            Med Note Hayley Case   Mon May 25, 2023 11:12 AM) Not taking every day  Docusate Sodium  (COLACE PO) [id number]  Take 1 capsule by mouth daily. Provider, Historical, Case  Active   estradiol (ESTRACE)  0.01 % CREA vaginal cream [id number]  Place 1 Applicatorful vaginally 3 (three) times a week. Hayley Case  Active   ezetimibe  (ZETIA ) 10 MG tablet [id number]  Take 1 tablet (10 mg total) by mouth daily. Hayley Case, Yvonne R, DO  Active   insulin  aspart (NOVOLOG  FLEXPEN) 100 UNIT/ML FlexPen [id number]  Max daily 45 units Hayley Case  Active            Med Note JUSTINO, [place] Case   Mon May 25, 2023 11:15 AM) Usually 2 meals per day  insulin  glargine, 2 Unit Dial , (TOUJEO  MAX SOLOSTAR) 300 UNIT/ML Solostar Pen [id number]  Inject 16 Units into the skin daily at 6 (six) AM. Hayley Case  Active   Insulin  Pen Needle (PEN NEEDLES) 32G X 4 MM MISC [id number]  1 Device by Does not apply route in the morning, at noon, in the evening, and at bedtime. Hayley Case  Active   loratadine (CLARITIN) 10 MG tablet [id number]  Take 10 mg by mouth daily as needed (seasonal allergies). Provider, Historical, Case  Active Self  losartan  (COZAAR ) 50 MG tablet [id number]  TAKE 1 TABLET BY MOUTH EVERY DAY Jason Leita Repine, Case  Active   meclizine  (ANTIVERT ) 12.5 MG tablet [id number]  Take 1 tablet (12.5 mg total) by mouth 3 (three) times daily as needed for dizziness. Jason Leita Repine, Case  Active   nitrofurantoin , macrocrystal-monohydrate, (MACROBID ) 100 MG capsule [id number]  Take 1 capsule (100 mg total) by mouth 2 (two) times daily. Hayley Case, Yvonne R, DO  Active   Polyethyl Glycol-Propyl Glycol (SYSTANE FREE OP) [id number]  Apply 1 drop to eye as needed. Provider, Historical, Case  Active   Polyvinyl Alcohol-Povidone (REFRESH OP) [id number]  Apply to eye. Provider, Historical, Case  Active   REPATHA  SURECLICK 140 MG/ML EMMANUEL [id number]  ADMINISTER 1 ML UNDER THE SKIN EVERY 14 DAYS O'Neal, Darryle Ned, Case  Active   Semaglutide , 2 MG/DOSE, 8 MG/3ML SOPN [id number]  Inject 2 mg as directed once a week. 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 04/21/2022   Primary osteoarthritis of right knee 03/31/2022   Type 2 diabetes mellitus with stage 4 chronic kidney disease, with long-term current use of insulin  (HCC) 07/02/2021   Arthritis of hip 04/18/2021   Background diabetic retinopathy (HCC) 04/18/2021   Chronic kidney disease, stage 3b (HCC) 04/18/2021   Dyslipidemia 04/18/2021   Family history of malignant neoplasm of digestive organs 04/18/2021   Gouty arthritis 04/18/2021   Hearing loss 04/18/2021   Hyperglycemia due to type 2 diabetes mellitus (HCC) 04/18/2021   Long term (current) use of insulin  (HCC) 04/18/2021   Obstructive sleep apnea (adult) (pediatric) 04/18/2021   Peripheral arterial disease 04/18/2021   History of colonic polyps 04/18/2021   Flank pain 10/14/2019   Type 2 diabetes mellitus with diabetic polyneuropathy, with long-term current use of insulin  (HCC) 12/01/2018   Type 2 diabetes mellitus with retinopathy, with long-term current use of insulin  (HCC) 12/01/2018   Type 2 diabetes mellitus with stage 3b chronic kidney disease, with long-term current use of insulin  (HCC) 11/30/2018   Non-intractable vomiting    Acute hepatitis 07/13/2018   Acute on chronic renal insufficiency 07/12/2018   Diabetes mellitus (HCC) 12/08/2016   Essential hypertension 12/08/2016   Hyperlipidemia associated with type 2 diabetes mellitus (HCC) 12/08/2016     Medication Assistance:  Re-Enrolled for [employer] Hyperlipidemia  - grant approved ($2500 - from 12/17/2023 thru 12/15/2024).     Card / ID No: [id number] BIN: [id number] PCN: [id number] PC Group: [id number]  Assessment / Plan: Diabetes: Last A1c was at goal of < 7.5%  Continue Ozempic  2mg  weekly  Continue Tresiba 16 units daily.  Continue Novolog  10 units with meals with additional based on blood glucose / sliding scale from endocrinology.  Continue to follow up with  Hayley Case.  Continue to use  Libre sensors to check blood glucose 3 or more times per day. Encouraged patient to use sensors regularly. Notify office if she has having an issues with getting sensor refills.   Hyperlipidemia / PAD / bilateral artery stenosis: LDL goal per cardiology office < 55 Continue Repatha  140mg  every 14 days and ezetimibe  10mg  daily.   If LDL still > 55 when rechecked in 1 to 2 months, consider adding Nexlitol or change to different PCSK9.   Continue to use [employer] - valid thru 12/2023. Assisted patient in renewing [employer] [place] today - now good thru 12/15/2024   Hypertension/CKD:  Continue amlodipine  10mg  daily and losartan  50mg  daily. Check blood pressure 2 to 3 times per week.   Medication management:  Reviewed and updated medication list Reviewed refill history and adherence  Health Maintenance:  Made appt in office today for influenza vaccine   Follow Up:  6 to 8 weeks    Madelin Ray, PharmD Clinical Pharmacist [facility]  - [facility]

## 2023-11-26 ENCOUNTER — Encounter: Payer: Self-pay | Admitting: Psychology

## 2023-12-17 ENCOUNTER — Ambulatory Visit: Admitting: Psychology

## 2023-12-17 DIAGNOSIS — F4323 Adjustment disorder with mixed anxiety and depressed mood: Secondary | ICD-10-CM
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
## 2023-12-18 ENCOUNTER — Other Ambulatory Visit: Payer: Self-pay | Admitting: Internal Medicine

## 2023-12-31 ENCOUNTER — Other Ambulatory Visit: Payer: Self-pay | Admitting: Pharmacist

## 2023-12-31 ENCOUNTER — Other Ambulatory Visit: Payer: Self-pay | Admitting: Cardiovascular Disease

## 2023-12-31 ENCOUNTER — Other Ambulatory Visit: Admitting: Pharmacist

## 2023-12-31 DIAGNOSIS — I10 Essential (primary) hypertension: Secondary | ICD-10-CM

## 2023-12-31 MED ORDER — LOSARTAN POTASSIUM 50 MG PO TABS: 50.0000 mg | ORAL_TABLET | Freq: Every day | ORAL | 0 refills | Status: AC

## 2023-12-31 MED ORDER — LOSARTAN POTASSIUM 50 MG PO TABS: 50.0000 mg | ORAL_TABLET | Freq: Every day | ORAL | 0 refills | Status: DC

## 2023-12-31 NOTE — Progress Notes (Signed)
 Pharmacy Note  12/31/2023 Name: Hayley Case MRN: [id number] DOB: 06/17/1950  Subjective: Hayley Case is a 73 y.o. year old female who is a primary care patient of Antonio Meth, Jamee SAUNDERS, DO. Clinical Pharmacist Practitioner referral was placed to assist with medication and hyperlipidemia management.    Engaged with patient by telephone for follow up visit today.   Diabetes:  Managed by endocrinology - Dr Sam. Last appointment was 11/04/2023, next appointment is 05/05/2024.  Current regimen:   - Ozempic  2 mg weekly  - Toujeo  to 16 units once daily - Novolog  10 units with each meal - CF: Novolog   (BG -130/30)  Eye exam is currently up to date. Last appt was 10/01/2023 with Dr Charmayne.   Wt Readings from Last 3 Encounters:  11/04/23 168 lb (76.2 kg)  09/18/23 171 lb 6.4 oz (77.7 kg)  09/15/23 170 lb (77.1 kg)   Uses Continuous Glucose Monitor - Libre 2 plus but has not been wearing sensor recently. No recent blood glucose data to review.    Hyperlipidemia / bilateral carotid artery stenosis / PAD: LDL goal per cardiology office < 55.   Current therapy: Repatha  140mg  every 14 days (every other Sunday) and ezetimibe  10mg  daily   Past therapies: Atorvastatin  caused increased LFTs requiring hospitalization. Rosuvastatin  caused myalgias.  Praluent  150mg  every 14 days 06/17/2021 but stopped due to cost. Switched to Repatha  - gets assistance with medication copay cost with [employer] (good thru 12/16/2023)  Hypertension/CKD:  Current therapy: amlodipine , losartan  Last nephrology appointment - 11/18/2023 with Dr Dolan.    BP Readings from Last 3 Encounters:  11/04/23 128/74  09/18/23 120/68  09/15/23 130/79    Vitamin D  = 56.7 (11/11/2023) PTH = 57 (11/11/2023)   Current hypertension therapy: losartan  50mg  daily and amlodipine  10mg  daily  Not on SGLT2 due to concerns about possible UTIs.    BP Readings from Last 3 Encounters:  11/04/23 128/74   09/18/23 120/68  09/15/23 130/79    Objective: Review of patient status, including review of consultants reports, laboratory and other test data, was performed as part of comprehensive.  Lab Results  Component Value Date   CREATININE 2.3 (A) 11/11/2023   CREATININE 2.45 (H) 08/12/2023   CREATININE 2.05 (H) 05/01/2023    Lab Results  Component Value Date   HGBA1C 7.2 (A) 11/04/2023       Component Value Date/Time   CHOL 174 05/01/2023 0935   TRIG 117 05/01/2023 0935   HDL 63 05/01/2023 0935   CHOLHDL 2.8 05/01/2023 0935   CHOLHDL 3 10/07/2022 0739   VLDL 22.0 10/07/2022 0739   LDLCALC 90 05/01/2023 0935   LDLDIRECT 79.0 10/07/2022 0739    Clinical ASCVD: Yes  The 10-year ASCVD risk score (Arnett DK, et al., 2019) is: 26.5%   Values used to calculate the score:     Age: 8 years     Clinically relevant sex: Female     Is Non-Hispanic African American: Yes     Diabetic: Yes     Tobacco smoker: No     Systolic Blood Pressure: 128 mmHg     Is BP treated: Yes     HDL Cholesterol: 63 mg/dL     Total Cholesterol: 174 mg/dL      Allergies  Allergen Reactions   Lisinopril Cough   Atorvastatin  Other (See Comments)    Liver function test elevation Other reaction(s): liver issues   Penicillins Hives and Itching    Did it  involve swelling of the face/tongue/throat, SOB, or low BP? No Did it involve sudden or severe rash/hives, skin peeling, or any reaction on the inside of your mouth or nose? Yes Did you need to seek medical attention at a hospital or doctor's office? Yes When did it last happen?      2012 If all above answers are NO, may proceed with cephalosporin use.   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 04/21/2022   Primary osteoarthritis of right knee 03/31/2022   Type 2 diabetes mellitus with stage 4 chronic kidney disease, with long-term current use of insulin  (HCC) 07/02/2021   Arthritis of hip 04/18/2021   Background diabetic retinopathy (HCC) 04/18/2021   Chronic kidney disease, stage 3b (HCC) 04/18/2021   Dyslipidemia 04/18/2021   Family history of malignant neoplasm of digestive organs 04/18/2021   Gouty arthritis 04/18/2021   Hearing loss 04/18/2021   Hyperglycemia due to type 2 diabetes mellitus (HCC) 04/18/2021   Long term (current) use of insulin  (HCC) 04/18/2021   Obstructive sleep apnea (adult) (pediatric) 04/18/2021   Peripheral arterial disease 04/18/2021   History of colonic polyps 04/18/2021   Flank pain 10/14/2019   Type 2 diabetes mellitus with diabetic polyneuropathy, with long-term current use of insulin  (HCC) 12/01/2018   Type 2 diabetes mellitus with retinopathy, with long-term current use of insulin  (HCC) 12/01/2018   Type 2 diabetes mellitus with stage 3b chronic kidney disease, with long-term current use of insulin  (HCC) 11/30/2018   Non-intractable vomiting    Acute hepatitis 07/13/2018   Acute on chronic renal insufficiency 07/12/2018   Diabetes mellitus (HCC) 12/08/2016   Essential hypertension 12/08/2016   Hyperlipidemia associated with type 2 diabetes mellitus (HCC) 12/08/2016     Medication Assistance:  Re-Enrolled for [employer] Hyperlipidemia  - grant approved ($2500 - from 12/17/2023 thru 12/15/2024).     Card / ID No: [id number] BIN: [id number] PCN: [id number] PC Group: [id number]  Assessment / Plan: Diabetes: Last A1c was at goal of < 7.5%  Continue Ozempic  2mg  weekly  Continue Tresiba 16 units daily.  Continue Novolog  10 units with meals with additional based on blood glucose / sliding scale from endocrinology.  Continue to follow up with Dr Sam.  Recommended she restart  using Libre sensors to check blood glucose 3 or more times per day. Encouraged patient to use sensors regularly. Notify office if she has having an issues with getting sensor refills.   Hyperlipidemia / PAD / bilateral artery stenosis: LDL goal per cardiology office < 55 Continue Repatha  140mg  every 14 days and ezetimibe  10mg  daily.   If LDL still > 55 when rechecked in 1 to 2 months, consider adding Nexlitol or change to different PCSK9.   Continue to use [employer] Grant - valid thru 12/15/2024. Provided [facility] with updated [employer] pharmacy card information   Hypertension/CKD:  Continue amlodipine  10mg  daily and losartan  50mg  daily. Updated Rx for losartan  for 60 days today - reminded patient she needs to make appointment for follow up chronic conditions with Dr AntonioGLENWOOD Meth. Patient to call first of 2026 to make appointment.  Check blood pressure 2 to 3 times per  week.   Medication management:  Reviewed and updated medication list Reviewed refill history and adherence   Follow Up:  6 to 8 weeks    Madelin Ray, PharmD Clinical Pharmacist [facility]  - [facility]

## 2024-01-19 ENCOUNTER — Ambulatory Visit: Admitting: Psychology

## 2024-01-19 DIAGNOSIS — F4323 Adjustment disorder with mixed anxiety and depressed mood: Secondary | ICD-10-CM
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
## 2024-01-25 ENCOUNTER — Telehealth: Payer: Self-pay | Admitting: Nutrition

## 2024-01-25 ENCOUNTER — Telehealth: Payer: Self-pay

## 2024-01-25 NOTE — Telephone Encounter (Signed)
 Copied from CRM [id number]. Topic: Clinical - Prescription Issue >> Jan 25, 2024 11:22 AM Deleta RAMAN wrote: Reason for CRM: patient would like tammy to know that pharmacy is not able to reach the endocrinology regarding Continuous Glucose Sensor (FREESTYLE LIBRE 3 SENSOR) MISC. Please contact patient regarding concerns

## 2024-01-25 NOTE — Telephone Encounter (Signed)
 Message left on my machine requesting chart notes be faxed to [phone] [facility].  Did not say why she needed them

## 2024-01-25 NOTE — Telephone Encounter (Signed)
 Records sent

## 2024-01-28 NOTE — Telephone Encounter (Signed)
 Called [facility]. Patient's sensor order has been filled. They are waiting on [employer] to pick up for shipping. Tracking number :[id number].   Called patient to provide tracking number. She was driving and asked that I send thru MyChart.  Patient also reported that her ezetimibe  cost was $21.60 but she usually pays $0 - she has a Orthoptist and is avialable thry 12/2024.   Called [facility]. Provided [employer] information  Card / ID No: [id number] BIN: [id number] PCN: [id number] PC Group: [id number]  Pharmacy reprocessed and ezetimibe  cost is now $0

## 2024-02-01 ENCOUNTER — Telehealth: Payer: Self-pay | Admitting: Pharmacist

## 2024-02-01 ENCOUNTER — Other Ambulatory Visit

## 2024-02-01 ENCOUNTER — Other Ambulatory Visit: Admitting: Pharmacist

## 2024-02-01 NOTE — Progress Notes (Signed)
 "   Pharmacy Note  02/01/24 Name: AREN CHERNE MRN: [id number] DOB: [birth year]  Subjective: Hayley Case Louder is a 74 y.o. year old female who is a primary care patient of Antonio Meth, Jamee SAUNDERS, DO. Clinical Pharmacist Practitioner referral was placed to assist with medication and hyperlipidemia management.    Engaged with patient by telephone for follow up visit today.   Diabetes:  Managed by endocrinology - Dr Sam. Last appointment was 11/04/2023, next appointment is 05/05/2024.  Current regimen:   - Ozempic  2 mg weekly  - Toujeo  to 16 units once daily - Novolog  10 units with each meal - CF: Novolog   (BG -130/30)  Eye exam is currently up to date. Last appt was 10/01/2023 with Dr Charmayne.   Wt Readings from Last 3 Encounters:  11/04/23 168 lb (76.2 kg)  09/18/23 171 lb 6.4 oz (77.7 kg)  09/15/23 170 lb (77.1 kg)   Uses Continuous Glucose Monitor - Libre 2 plus but has not been wearing sensor recently because she was having trouble getting from [place]. She endorses that she did receive a delivery Saturday 01/30/2024 but she has not placed new sensor yet. He son had a fall on 01/30/2024 and she has been caring for him.     Hyperlipidemia / bilateral carotid artery stenosis / PAD: LDL goal per cardiology office < 55.   Current therapy: Repatha  140mg  every 14 days (every other Sunday) and ezetimibe  10mg  daily   Past therapies: Atorvastatin  caused increased LFTs requiring hospitalization. Rosuvastatin  caused myalgias.  Praluent  150mg  every 14 days 06/17/2021 but stopped due to cost. Switched to Repatha  - gets assistance with medication copay cost with [employer] (good thru 12/16/2023)  Hypertension/CKD:  Current therapy: amlodipine , losartan  Last nephrology appointment - 11/18/2023 with Dr Dolan.    BP Readings from Last 3 Encounters:  11/04/23 128/74  09/18/23 120/68  09/15/23 130/79    Vitamin D  = 56.7 (11/11/2023) PTH = 57 (11/11/2023)   Current  hypertension therapy: losartan  50mg  daily and amlodipine  10mg  daily  Not on SGLT2 due to concerns about possible UTIs.    BP Readings from Last 3 Encounters:  11/04/23 128/74  09/18/23 120/68  09/15/23 130/79    Objective: Review of patient status, including review of consultants reports, laboratory and other test data, was performed as part of comprehensive.  Lab Results  Component Value Date   CREATININE 2.3 (A) 11/11/2023   CREATININE 2.45 (H) 08/12/2023   CREATININE 2.05 (H) 05/01/2023    Lab Results  Component Value Date   HGBA1C 7.2 (A) 11/04/2023       Component Value Date/Time   CHOL 174 05/01/2023 0935   TRIG 117 05/01/2023 0935   HDL 63 05/01/2023 0935   CHOLHDL 2.8 05/01/2023 0935   CHOLHDL 3 10/07/2022 0739   VLDL 22.0 10/07/2022 0739   LDLCALC 90 05/01/2023 0935   LDLDIRECT 79.0 10/07/2022 0739    Clinical ASCVD: Yes  The 10-year ASCVD risk score (Arnett DK, et al., 2019) is: 26.5%   Values used to calculate the score:     Age: 70 years     Clinically relevant sex: Female     Is Non-Hispanic African American: Yes     Diabetic: Yes     Tobacco smoker: No     Systolic Blood Pressure: 128 mmHg     Is BP treated: Yes     HDL Cholesterol: 63 mg/dL     Total Cholesterol: 174 mg/dL      Allergies  Allergen Reactions   Lisinopril Cough   Atorvastatin  Other (See Comments)    Liver function test elevation Other reaction(s): liver issues   Penicillins Hives and Itching    Did it involve swelling of the face/tongue/throat, SOB, or low BP? No Did it involve sudden or severe rash/hives, skin peeling, or any reaction on the inside of your mouth or nose? Yes Did you need to seek medical attention at a hospital or doctor's office? Yes When did it last happen?      2012 If all above answers are NO, may proceed with cephalosporin use.   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 04/21/2022   Primary osteoarthritis of right knee 03/31/2022   Type 2 diabetes mellitus with stage 4 chronic kidney disease, with long-term current use of insulin  (HCC) 07/02/2021   Arthritis of hip 04/18/2021   Background diabetic retinopathy (HCC) 04/18/2021   Chronic kidney disease, stage 3b (HCC) 04/18/2021   Dyslipidemia 04/18/2021   Family history of malignant neoplasm of digestive organs 04/18/2021   Gouty arthritis 04/18/2021   Hearing loss 04/18/2021   Hyperglycemia due to type 2 diabetes mellitus (HCC) 04/18/2021   Long term (current) use of insulin  (HCC) 04/18/2021   Obstructive sleep apnea (adult) (pediatric) 04/18/2021   Peripheral arterial disease 04/18/2021   History of colonic polyps 04/18/2021   Flank pain 10/14/2019   Type 2 diabetes mellitus with diabetic polyneuropathy, with long-term current use of insulin  (HCC) 12/01/2018   Type 2 diabetes mellitus with retinopathy, with long-term current use of insulin  (HCC) 12/01/2018   Type 2 diabetes mellitus with stage 3b chronic kidney disease, with long-term current use of insulin  (HCC) 11/30/2018   Non-intractable vomiting    Acute hepatitis 07/13/2018   Acute on chronic renal insufficiency 07/12/2018   Diabetes mellitus (HCC) 12/08/2016   Essential hypertension 12/08/2016   Hyperlipidemia associated with type 2 diabetes mellitus (HCC) 12/08/2016     Medication Assistance:  Re-Enrolled for [employer] Hyperlipidemia  - grant approved ($2500 - from 12/17/2023 thru 12/15/2024).     Card / ID No: [id number] BIN: [id number] PCN: [id number] PC Group: [id number]  Assessment / Plan: Diabetes: Last A1c was at goal of < 7.5%  Continue Ozempic  2mg  weekly  Continue Tresiba 16 units daily.   Continue Novolog  10 units with meals with additional based on blood glucose / sliding scale from endocrinology.  Continue to follow up with Dr Sam.  Recommended she restart using Libre sensors to check blood glucose 3 or more times per day. Encouraged patient to use sensors regularly. Notify office if she has future issues with getting sensor refills.   Hyperlipidemia / PAD / bilateral artery stenosis: LDL goal per cardiology office < 55 Continue Repatha  140mg  every 14 days and ezetimibe  10mg  daily.   If LDL still > 55 when rechecked in 1 to 2 months, consider adding Nexlitol or change to different PCSK9.   Continue to use [employer] Grant - valid thru 12/15/2024.    Hypertension/CKD:  Continue amlodipine  10mg  daily and losartan  50mg  daily. Check blood pressure 2 to 3 times per week.   Medication management:  Reviewed and updated  medication list Reviewed refill history and adherence   Follow Up:  6 to 8 weeks    Madelin Ray, PharmD Clinical Pharmacist [facility]  - [facility]    "

## 2024-02-01 NOTE — Telephone Encounter (Signed)
 Attempt was made to contact patient by phone today for follow up by Clinical Pharmacist regarding diabetes / medication management.  Unable to reach patient. LM on VM with my contact number [phone].

## 2024-02-02 NOTE — Telephone Encounter (Signed)
 Patient called back later in afternoon - see rescheduled phone appt notes.

## 2024-02-18 ENCOUNTER — Ambulatory Visit: Admitting: Psychology

## 2024-02-18 DIAGNOSIS — F4323 Adjustment disorder with mixed anxiety and depressed mood: Secondary | ICD-10-CM
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
## 2024-03-17 ENCOUNTER — Ambulatory Visit: Admitting: Psychology

## 2024-04-14 ENCOUNTER — Ambulatory Visit: Admitting: Psychology

## 2024-05-05 ENCOUNTER — Ambulatory Visit: Admitting: Internal Medicine

## 2024-05-31 ENCOUNTER — Ambulatory Visit
